# Patient Record
Sex: Male | Born: 1940 | Race: White | Hispanic: No | Smoking: Former smoker
Health system: Southern US, Community
[De-identification: ages and names within clinical notes are randomized; demographics above are authoritative.]

## PROBLEM LIST (undated history)

## (undated) DIAGNOSIS — H269 Unspecified cataract: Secondary | ICD-10-CM

## (undated) DIAGNOSIS — I2510 Atherosclerotic heart disease of native coronary artery without angina pectoris: Secondary | ICD-10-CM

## (undated) DIAGNOSIS — I10 Essential (primary) hypertension: Secondary | ICD-10-CM

## (undated) DIAGNOSIS — M1990 Unspecified osteoarthritis, unspecified site: Secondary | ICD-10-CM

## (undated) DIAGNOSIS — H409 Unspecified glaucoma: Secondary | ICD-10-CM

## (undated) DIAGNOSIS — K279 Peptic ulcer, site unspecified, unspecified as acute or chronic, without hemorrhage or perforation: Secondary | ICD-10-CM

## (undated) DIAGNOSIS — N400 Enlarged prostate without lower urinary tract symptoms: Secondary | ICD-10-CM

## (undated) DIAGNOSIS — E785 Hyperlipidemia, unspecified: Secondary | ICD-10-CM

## (undated) DIAGNOSIS — I214 Non-ST elevation (NSTEMI) myocardial infarction: Secondary | ICD-10-CM

## (undated) DIAGNOSIS — M75100 Unspecified rotator cuff tear or rupture of unspecified shoulder, not specified as traumatic: Secondary | ICD-10-CM

## (undated) DIAGNOSIS — J189 Pneumonia, unspecified organism: Secondary | ICD-10-CM

## (undated) DIAGNOSIS — F419 Anxiety disorder, unspecified: Secondary | ICD-10-CM

## (undated) DIAGNOSIS — K219 Gastro-esophageal reflux disease without esophagitis: Secondary | ICD-10-CM

## (undated) DIAGNOSIS — Z955 Presence of coronary angioplasty implant and graft: Secondary | ICD-10-CM

## (undated) HISTORY — DX: Essential (primary) hypertension: I10

## (undated) HISTORY — PX: LEG SURGERY: SHX1003

## (undated) HISTORY — DX: Pneumonia, unspecified organism: J18.9

## (undated) HISTORY — DX: Unspecified rotator cuff tear or rupture of unspecified shoulder, not specified as traumatic: M75.100

## (undated) HISTORY — PX: TONSILLECTOMY: SUR1361

## (undated) HISTORY — DX: Anxiety disorder, unspecified: F41.9

## (undated) HISTORY — PX: APPENDECTOMY: SHX54

## (undated) HISTORY — DX: Unspecified osteoarthritis, unspecified site: M19.90

## (undated) HISTORY — DX: Unspecified cataract: H26.9

## (undated) HISTORY — PX: EYE SURGERY: SHX253

## (undated) HISTORY — DX: Atherosclerotic heart disease of native coronary artery without angina pectoris: I25.10

## (undated) HISTORY — DX: Hyperlipidemia, unspecified: E78.5

## (undated) HISTORY — DX: Peptic ulcer, site unspecified, unspecified as acute or chronic, without hemorrhage or perforation: K27.9

## (undated) HISTORY — DX: Presence of coronary angioplasty implant and graft: Z95.5

## (undated) HISTORY — DX: Unspecified glaucoma: H40.9

---

## 2006-01-13 HISTORY — PX: CORONARY ARTERY BYPASS GRAFT: SHX141

## 2006-09-11 ENCOUNTER — Ambulatory Visit: Payer: Self-pay | Admitting: Cardiothoracic Surgery

## 2006-10-28 ENCOUNTER — Ambulatory Visit: Payer: Self-pay | Admitting: Thoracic Surgery (Cardiothoracic Vascular Surgery)

## 2009-06-28 ENCOUNTER — Telehealth: Payer: Self-pay | Admitting: Cardiology

## 2010-02-12 NOTE — Progress Notes (Signed)
Summary: QUESTIONS REGARDING MED  Phone Note Call from Patient Call back at Home Phone [phone]   Caller: Patient [phone] Reason for Call: Talk to Nurse Summary of Call: PT WAITING ON MAIL ORDER OF DILTIAZEL 120MG  ,JUST FOUND OUT IT WON'T BE SHIPPED UNTIL 6-21, AND HE WON'T GET IT UNTIL 6-23, CAN HE BE WITHOUT THE MED THAT LONG-ALREADY BEEN OUT FOR TWO DAYS-IF NOT HE HAS A WRITTEN RX HE CAN FILL Initial call taken by: Glynda Jaeger,  June 28, 2009 4:17 PM  Follow-up for Phone Call        pt needs to be on medication,  he states he has an rx for a local pharmacy and will have it filled Follow-up by: Charolotte Capuchin, RN,  June 28, 2009 4:46 PM

## 2014-06-07 DIAGNOSIS — I2510 Atherosclerotic heart disease of native coronary artery without angina pectoris: Secondary | ICD-10-CM

## 2014-06-07 HISTORY — DX: Atherosclerotic heart disease of native coronary artery without angina pectoris: I25.10

## 2014-06-17 DIAGNOSIS — I25810 Atherosclerosis of coronary artery bypass graft(s) without angina pectoris: Secondary | ICD-10-CM

## 2014-06-17 DIAGNOSIS — I25700 Atherosclerosis of coronary artery bypass graft(s), unspecified, with unstable angina pectoris: Secondary | ICD-10-CM | POA: Insufficient documentation

## 2014-06-17 HISTORY — DX: Atherosclerosis of coronary artery bypass graft(s) without angina pectoris: I25.810

## 2014-06-17 HISTORY — DX: Atherosclerosis of coronary artery bypass graft(s), unspecified, with unstable angina pectoris: I25.700

## 2014-11-14 HISTORY — PX: CORONARY ANGIOPLASTY WITH STENT PLACEMENT: SHX49

## 2015-04-01 DIAGNOSIS — K828 Other specified diseases of gallbladder: Secondary | ICD-10-CM | POA: Insufficient documentation

## 2015-04-01 DIAGNOSIS — R9439 Abnormal result of other cardiovascular function study: Secondary | ICD-10-CM

## 2015-04-01 HISTORY — DX: Other specified diseases of gallbladder: K82.8

## 2015-04-01 HISTORY — DX: Abnormal result of other cardiovascular function study: R94.39

## 2017-06-30 ENCOUNTER — Ambulatory Visit: Payer: Self-pay | Admitting: Cardiology

## 2017-07-02 ENCOUNTER — Ambulatory Visit: Payer: Self-pay | Admitting: Cardiology

## 2017-07-21 DIAGNOSIS — Z951 Presence of aortocoronary bypass graft: Secondary | ICD-10-CM

## 2017-07-21 DIAGNOSIS — K581 Irritable bowel syndrome with constipation: Secondary | ICD-10-CM | POA: Insufficient documentation

## 2017-07-21 DIAGNOSIS — N400 Enlarged prostate without lower urinary tract symptoms: Secondary | ICD-10-CM | POA: Insufficient documentation

## 2017-07-21 DIAGNOSIS — E7800 Pure hypercholesterolemia, unspecified: Secondary | ICD-10-CM

## 2017-07-21 DIAGNOSIS — I10 Essential (primary) hypertension: Secondary | ICD-10-CM | POA: Insufficient documentation

## 2017-07-21 DIAGNOSIS — K219 Gastro-esophageal reflux disease without esophagitis: Secondary | ICD-10-CM

## 2017-07-21 DIAGNOSIS — E785 Hyperlipidemia, unspecified: Secondary | ICD-10-CM | POA: Insufficient documentation

## 2017-07-21 HISTORY — DX: Pure hypercholesterolemia, unspecified: E78.00

## 2017-07-21 HISTORY — DX: Presence of aortocoronary bypass graft: Z95.1

## 2017-07-21 HISTORY — DX: Gastro-esophageal reflux disease without esophagitis: K21.9

## 2017-07-21 HISTORY — DX: Irritable bowel syndrome with constipation: K58.1

## 2017-07-27 ENCOUNTER — Ambulatory Visit: Payer: Self-pay | Admitting: Cardiology

## 2017-08-17 ENCOUNTER — Encounter: Payer: Self-pay | Admitting: Cardiology

## 2017-08-18 ENCOUNTER — Encounter: Payer: Self-pay | Admitting: Cardiology

## 2017-08-18 ENCOUNTER — Ambulatory Visit: Payer: Medicare HMO | Admitting: Cardiology

## 2017-08-18 VITALS — BP 122/68 | HR 62 | Ht 70.0 in | Wt 194.0 lb

## 2017-08-18 DIAGNOSIS — I208 Other forms of angina pectoris: Secondary | ICD-10-CM

## 2017-08-18 DIAGNOSIS — I2510 Atherosclerotic heart disease of native coronary artery without angina pectoris: Secondary | ICD-10-CM | POA: Insufficient documentation

## 2017-08-18 DIAGNOSIS — Z951 Presence of aortocoronary bypass graft: Secondary | ICD-10-CM

## 2017-08-18 DIAGNOSIS — E782 Mixed hyperlipidemia: Secondary | ICD-10-CM | POA: Diagnosis not present

## 2017-08-18 DIAGNOSIS — I10 Essential (primary) hypertension: Secondary | ICD-10-CM | POA: Diagnosis not present

## 2017-08-18 NOTE — Patient Instructions (Signed)
Medication Instructions:  Your physician recommends that you continue on your current medications as directed. Please refer to the Current Medication list given to you today.   Labwork: NONE  Testing/Procedures: Your physician has requested that you have a lexiscan myoview. For further information please visit [email or web address]. Please follow instruction sheet, as given.  Stress Test Directions for [facility]: 1.) Please check in at the outpatient center at [facility] the day of your testing. 2.) Nothing to eat or drink after midnight prior to testing. You may take your medications that morning with water. 3.) Please be aware that the test can take up to 3-4 hours. 4.) Should you have any problem with the appointment date or time, please call [phone].    Follow-Up: Your physician wants you to follow-up in: 6 months. You will receive a reminder letter in the mail two months in advance. If you don't receive a letter, please call our office to schedule the follow-up appointment.  Any Other Special Instructions Will Be Listed Below (If Applicable).     If you need a refill on your cardiac medications before your next appointment, please call your pharmacy.

## 2017-08-18 NOTE — Progress Notes (Signed)
Cardiology Office Note:    Date:  08/18/2017   ID:  Ival Bible, DOB [birth year], MRN [id number]  PCP:  Shelbie Ammons, MD  Cardiologist:  Garwin Brothers, MD   Referring MD: Shelbie Ammons, MD    ASSESSMENT:    1. Other forms of angina pectoris (HCC)   2. Essential hypertension   3. Hx of CABG   4. Mixed hyperlipidemia    PLAN:    In order of problems listed above:  1. Secondary prevention stressed to the patient.  Importance of compliance with diet and medications stressed and he vocalized understanding. 2. His blood pressure is stable.  Diet was explained for dyslipidemia.  Sublingual nitroglycerin prescription was sent, its protocol and 911 protocol explained and the patient vocalized understanding questions were answered to the patient's satisfaction 3. In view of his significant symptoms we will do a Lexiscan sestamibi. 4. 4 months follow-up or earlier if he has any concerns.   Medication Adjustments/Labs and Tests Ordered: Current medicines are reviewed at length with the patient today.  Concerns regarding medicines are outlined above.  Orders Placed This Encounter  Procedures  . Myocardial Perfusion Imaging  . EKG 12-Lead   No orders of the defined types were placed in this encounter.    History of Present Illness:    Austin Malone is a 77 y.o. male who is being seen today for the evaluation of angina pectoris at the request of Shelbie Ammons, MD.  Patient is a pleasant 77 year old male.  He recently was in the hospital for dehydration and treated.  He mentions to me that he has chest tightness almost couple of times every week.  This happens at night.  No orthopnea or PND.  This does not radiate to the neck or to the arm.  The patient is a sublingual nitroglycerin with relief of the symptoms.  This happens on a very regular basis.  At the time of my evaluation, the patient is alert awake oriented and in no distress.  This patient has been under my care in my  previous practice.  He is here now to transfer his care and be established with my current practice.  Past Medical History:  Diagnosis Date  . Anxiety   . Arthritis   . CAD (coronary artery disease)   . Cataracts, bilateral   . Glaucoma   . Hyperlipidemia   . Hypertension   . Pneumonia   . PUD (peptic ulcer disease)   . Rotator cuff tear     Past Surgical History:  Procedure Laterality Date  . APPENDECTOMY    . CORONARY ARTERY BYPASS GRAFT  2008  . EYE SURGERY    . heart stent    . LEG SURGERY    . TONSILLECTOMY      Current Medications: Current Meds  Medication Sig  . alfuzosin (UROXATRAL) 10 MG 24 hr tablet Take 1 tablet by mouth daily.  Marland Kitchen ALPRAZolam (XANAX) 0.5 MG tablet Take 1 tablet by mouth daily.  Marland Kitchen aspirin EC 81 MG tablet Take 81 mg by mouth daily.  . bethanechol (URECHOLINE) 50 MG tablet Take 50 mg by mouth 3 (three) times daily.  . carvedilol (COREG) 6.25 MG tablet Take 1 tablet by mouth 2 (two) times daily.  . Cholecalciferol (VITAMIN D-1000 MAX ST) 1000 units tablet Take 1 capsule by mouth daily.  . Coenzyme Q10 100 MG capsule Take 1 capsule by mouth daily.  . dorzolamide-timolol (COSOPT) 22.3-6.8 MG/ML ophthalmic solution Place 1  drop into both eyes 2 times daily.  . finasteride (PROSCAR) 5 MG tablet Take 1 tablet by mouth daily.  . furosemide (LASIX) 20 MG tablet Take 1 tablet by mouth daily.  . Melatonin 3 MG TABS Take 1 tablet by mouth daily.  . nitroGLYCERIN (NITROSTAT) 0.4 MG SL tablet Place 0.4 mg under the tongue every 5 (five) minutes as needed.  Marland Kitchen omega-3 acid ethyl esters (LOVAZA) 1 g capsule Take 2 g by mouth daily.  . ondansetron (ZOFRAN) 8 MG tablet Take by mouth every 8 (eight) hours as needed for nausea or vomiting.  . pantoprazole (PROTONIX) 40 MG tablet Take 1 tablet by mouth daily.  . ranolazine (RANEXA) 1000 MG SR tablet Take 1 tablet by mouth 2 (two) times daily.  . TRAVATAN Z 0.004 % SOLN ophthalmic solution PLACE 1 DROP IN EACH EYE EVERY  EVENING FOR 30 DAYS. MAY USE ADDITIONAL DROP(S) DUE TO DEXTERITY issuE.  . vitamin C (ASCORBIC ACID) 500 MG tablet Take 1 tablet by mouth daily.     Allergies:   Codeine and Lisinopril   Social History   Socioeconomic History  . Marital status: Married    Spouse name: Not on file  . Number of children: Not on file  . Years of education: Not on file  . Highest education level: Not on file  Occupational History  . Not on file  Social Needs  . Financial resource strain: Not on file  . Food insecurity:    Worry: Not on file    Inability: Not on file  . Transportation needs:    Medical: Not on file    Non-medical: Not on file  Tobacco Use  . Smoking status: Former Games developer  . Smokeless tobacco: Never Used  Substance and Sexual Activity  . Alcohol use: Not on file  . Drug use: Not on file  . Sexual activity: Not on file  Lifestyle  . Physical activity:    Days per week: Not on file    Minutes per session: Not on file  . Stress: Not on file  Relationships  . Social connections:    Talks on phone: Not on file    Gets together: Not on file    Attends religious service: Not on file    Active member of club or organization: Not on file    Attends meetings of clubs or organizations: Not on file    Relationship status: Not on file  Other Topics Concern  . Not on file  Social History Narrative  . Not on file     Family History: The patient's family history is not on file.  ROS:   Please see the history of present illness.    All other systems reviewed and are negative.  EKGs/Labs/Other Studies Reviewed:    The following studies were reviewed today: EKG reveals sinus rhythm and nonspecific ST-T changes   Recent Labs: No results found for requested labs within last 8760 hours.  Recent Lipid Panel No results found for: CHOL, TRIG, HDL, CHOLHDL, VLDL, LDLCALC, LDLDIRECT  Physical Exam:    VS:  BP 122/68 (BP Location: Right Arm, Patient Position: Sitting, Cuff Size:  Normal)   Pulse 62   Ht 5\' 10"  (1.778 m)   Wt 194 lb (88 kg)   SpO2 97%   BMI 27.84 kg/m     Wt Readings from Last 3 Encounters:  08/18/17 194 lb (88 kg)     GEN: Patient is in no acute distress HEENT: Normal  NECK: No JVD; No carotid bruits LYMPHATICS: No lymphadenopathy CARDIAC: S1 S2 regular, 2/6 systolic murmur at the apex. RESPIRATORY:  Clear to auscultation without rales, wheezing or rhonchi  ABDOMEN: Soft, non-tender, non-distended MUSCULOSKELETAL:  No edema; No deformity  SKIN: Warm and dry NEUROLOGIC:  Alert and oriented x 3 PSYCHIATRIC:  Normal affect    Signed, Garwin Brothersajan R Mylen Mangan, MD  08/18/2017 3:41 PM    [facility]

## 2017-08-20 DIAGNOSIS — Z789 Other specified health status: Secondary | ICD-10-CM

## 2017-08-20 DIAGNOSIS — K219 Gastro-esophageal reflux disease without esophagitis: Secondary | ICD-10-CM

## 2017-08-20 DIAGNOSIS — I10 Essential (primary) hypertension: Secondary | ICD-10-CM

## 2017-08-20 DIAGNOSIS — I249 Acute ischemic heart disease, unspecified: Secondary | ICD-10-CM

## 2017-08-20 DIAGNOSIS — E781 Pure hyperglyceridemia: Secondary | ICD-10-CM

## 2017-08-20 DIAGNOSIS — I2510 Atherosclerotic heart disease of native coronary artery without angina pectoris: Secondary | ICD-10-CM

## 2017-08-20 DIAGNOSIS — R079 Chest pain, unspecified: Secondary | ICD-10-CM

## 2017-08-21 ENCOUNTER — Encounter (HOSPITAL_COMMUNITY)
Admission: AD | Disposition: A | Discharge status: Home / Self Care | Payer: Self-pay | Source: Other Acute Inpatient Hospital | Attending: Internal Medicine

## 2017-08-21 ENCOUNTER — Inpatient Hospital Stay (HOSPITAL_COMMUNITY)
Admission: AD | Admit: 2017-08-21 | Discharge: 2017-08-23 | DRG: 280 | Disposition: A | Discharge status: Home / Self Care | Payer: Medicare HMO | Source: Other Acute Inpatient Hospital | Attending: Internal Medicine | Admitting: Internal Medicine

## 2017-08-21 ENCOUNTER — Encounter (HOSPITAL_COMMUNITY): Payer: Self-pay

## 2017-08-21 ENCOUNTER — Other Ambulatory Visit: Payer: Self-pay

## 2017-08-21 DIAGNOSIS — Z885 Allergy status to narcotic agent status: Secondary | ICD-10-CM

## 2017-08-21 DIAGNOSIS — I10 Essential (primary) hypertension: Secondary | ICD-10-CM | POA: Diagnosis present

## 2017-08-21 DIAGNOSIS — I25810 Atherosclerosis of coronary artery bypass graft(s) without angina pectoris: Secondary | ICD-10-CM | POA: Diagnosis present

## 2017-08-21 DIAGNOSIS — Z955 Presence of coronary angioplasty implant and graft: Secondary | ICD-10-CM | POA: Diagnosis not present

## 2017-08-21 DIAGNOSIS — I130 Hypertensive heart and chronic kidney disease with heart failure and stage 1 through stage 4 chronic kidney disease, or unspecified chronic kidney disease: Secondary | ICD-10-CM | POA: Diagnosis present

## 2017-08-21 DIAGNOSIS — E785 Hyperlipidemia, unspecified: Secondary | ICD-10-CM | POA: Diagnosis not present

## 2017-08-21 DIAGNOSIS — Z79890 Hormone replacement therapy (postmenopausal): Secondary | ICD-10-CM

## 2017-08-21 DIAGNOSIS — I214 Non-ST elevation (NSTEMI) myocardial infarction: Secondary | ICD-10-CM

## 2017-08-21 DIAGNOSIS — I509 Heart failure, unspecified: Secondary | ICD-10-CM | POA: Diagnosis present

## 2017-08-21 DIAGNOSIS — I252 Old myocardial infarction: Secondary | ICD-10-CM | POA: Diagnosis not present

## 2017-08-21 DIAGNOSIS — N400 Enlarged prostate without lower urinary tract symptoms: Secondary | ICD-10-CM | POA: Diagnosis not present

## 2017-08-21 DIAGNOSIS — Z7982 Long term (current) use of aspirin: Secondary | ICD-10-CM | POA: Diagnosis not present

## 2017-08-21 DIAGNOSIS — I2510 Atherosclerotic heart disease of native coronary artery without angina pectoris: Secondary | ICD-10-CM

## 2017-08-21 DIAGNOSIS — I5033 Acute on chronic diastolic (congestive) heart failure: Secondary | ICD-10-CM | POA: Diagnosis present

## 2017-08-21 DIAGNOSIS — N183 Chronic kidney disease, stage 3 (moderate): Secondary | ICD-10-CM | POA: Diagnosis not present

## 2017-08-21 DIAGNOSIS — I351 Nonrheumatic aortic (valve) insufficiency: Secondary | ICD-10-CM | POA: Diagnosis not present

## 2017-08-21 DIAGNOSIS — I25110 Atherosclerotic heart disease of native coronary artery with unstable angina pectoris: Secondary | ICD-10-CM | POA: Diagnosis present

## 2017-08-21 DIAGNOSIS — I2584 Coronary atherosclerosis due to calcified coronary lesion: Secondary | ICD-10-CM | POA: Diagnosis not present

## 2017-08-21 DIAGNOSIS — F1010 Alcohol abuse, uncomplicated: Secondary | ICD-10-CM | POA: Diagnosis present

## 2017-08-21 DIAGNOSIS — Z91041 Radiographic dye allergy status: Secondary | ICD-10-CM

## 2017-08-21 DIAGNOSIS — K219 Gastro-esophageal reflux disease without esophagitis: Secondary | ICD-10-CM | POA: Diagnosis present

## 2017-08-21 DIAGNOSIS — I340 Nonrheumatic mitral (valve) insufficiency: Secondary | ICD-10-CM | POA: Diagnosis not present

## 2017-08-21 DIAGNOSIS — Z8711 Personal history of peptic ulcer disease: Secondary | ICD-10-CM | POA: Diagnosis not present

## 2017-08-21 DIAGNOSIS — H409 Unspecified glaucoma: Secondary | ICD-10-CM | POA: Diagnosis not present

## 2017-08-21 DIAGNOSIS — Z8249 Family history of ischemic heart disease and other diseases of the circulatory system: Secondary | ICD-10-CM | POA: Diagnosis not present

## 2017-08-21 DIAGNOSIS — Z79899 Other long term (current) drug therapy: Secondary | ICD-10-CM | POA: Diagnosis not present

## 2017-08-21 DIAGNOSIS — Z87891 Personal history of nicotine dependence: Secondary | ICD-10-CM | POA: Diagnosis not present

## 2017-08-21 DIAGNOSIS — I249 Acute ischemic heart disease, unspecified: Secondary | ICD-10-CM | POA: Diagnosis not present

## 2017-08-21 DIAGNOSIS — F419 Anxiety disorder, unspecified: Secondary | ICD-10-CM | POA: Diagnosis not present

## 2017-08-21 DIAGNOSIS — R079 Chest pain, unspecified: Secondary | ICD-10-CM | POA: Diagnosis present

## 2017-08-21 DIAGNOSIS — Z888 Allergy status to other drugs, medicaments and biological substances status: Secondary | ICD-10-CM | POA: Diagnosis not present

## 2017-08-21 HISTORY — DX: Gastro-esophageal reflux disease without esophagitis: K21.9

## 2017-08-21 HISTORY — DX: Non-ST elevation (NSTEMI) myocardial infarction: I21.4

## 2017-08-21 HISTORY — PX: LEFT HEART CATH AND CORS/GRAFTS ANGIOGRAPHY: CATH118250

## 2017-08-21 HISTORY — DX: Benign prostatic hyperplasia without lower urinary tract symptoms: N40.0

## 2017-08-21 LAB — TYPE AND SCREEN
ABO/RH(D): A POS
Antibody Screen: NEGATIVE

## 2017-08-21 LAB — CBC
HEMATOCRIT: 35.7 % — AB (ref 39.0–52.0)
Hemoglobin: 12.1 g/dL — ABNORMAL LOW (ref 13.0–17.0)
MCH: 33.4 pg (ref 26.0–34.0)
MCHC: 33.9 g/dL (ref 30.0–36.0)
MCV: 98.6 fL (ref 78.0–100.0)
Platelets: 216 10*3/uL (ref 150–400)
RBC: 3.62 MIL/uL — ABNORMAL LOW (ref 4.22–5.81)
RDW: 12.8 % (ref 11.5–15.5)
WBC: 6.3 10*3/uL (ref 4.0–10.5)

## 2017-08-21 LAB — TROPONIN I
TROPONIN I: 0.12 ng/mL — AB (ref <0.03)
TROPONIN I: 0.07 ng/mL — AB (ref <0.03)

## 2017-08-21 LAB — ABO/RH: ABO/RH(D): A POS

## 2017-08-21 SURGERY — LEFT HEART CATH AND CORS/GRAFTS ANGIOGRAPHY
Anesthesia: LOCAL

## 2017-08-21 MED ORDER — VERAPAMIL HCL 2.5 MG/ML IV SOLN
INTRAVENOUS | Status: DC | PRN
  Administered 2017-08-21: 10 mL via INTRA_ARTERIAL

## 2017-08-21 MED ORDER — SODIUM CHLORIDE 0.9% FLUSH: 3.0000 mL | INTRAVENOUS | Status: DC | PRN

## 2017-08-21 MED ORDER — FAMOTIDINE IN NACL 20-0.9 MG/50ML-% IV SOLN
20.0000 mg | Freq: Once | INTRAVENOUS | Status: AC
  Administered 2017-08-21: 20 mg via INTRAVENOUS
  Filled 2017-08-21: qty 50

## 2017-08-21 MED ORDER — CLOPIDOGREL BISULFATE 75 MG PO TABS
75.0000 mg | ORAL_TABLET | Freq: Every day | ORAL | Status: DC
  Administered 2017-08-22 – 2017-08-23 (×2): 75 mg via ORAL
  Filled 2017-08-21 (×2): qty 1

## 2017-08-21 MED ORDER — HEPARIN SODIUM (PORCINE) 1000 UNIT/ML IJ SOLN
INTRAMUSCULAR | Status: DC | PRN
  Administered 2017-08-21: 4000 [IU] via INTRAVENOUS

## 2017-08-21 MED ORDER — HEPARIN (PORCINE) IN NACL 1000-0.9 UT/500ML-% IV SOLN
INTRAVENOUS | Status: AC
  Filled 2017-08-21: qty 1000

## 2017-08-21 MED ORDER — SODIUM CHLORIDE 0.9 % IV SOLN
INTRAVENOUS | Status: AC
  Administered 2017-08-21: 19:00:00 via INTRAVENOUS

## 2017-08-21 MED ORDER — FENTANYL CITRATE (PF) 100 MCG/2ML IJ SOLN
INTRAMUSCULAR | Status: DC | PRN
  Administered 2017-08-21: 25 ug via INTRAVENOUS

## 2017-08-21 MED ORDER — DIPHENHYDRAMINE HCL 50 MG/ML IJ SOLN
25.0000 mg | Freq: Once | INTRAMUSCULAR | Status: AC
  Administered 2017-08-21: 25 mg via INTRAVENOUS
  Filled 2017-08-21: qty 1

## 2017-08-21 MED ORDER — SODIUM CHLORIDE 0.9 % WEIGHT BASED INFUSION: 1.0000 mL/kg/h | INTRAVENOUS | Status: DC

## 2017-08-21 MED ORDER — SODIUM CHLORIDE 0.9% FLUSH: 3.0000 mL | Freq: Two times a day (BID) | INTRAVENOUS | Status: DC

## 2017-08-21 MED ORDER — ONDANSETRON HCL 4 MG PO TABS: 4.0000 mg | ORAL_TABLET | Freq: Four times a day (QID) | ORAL | Status: DC | PRN

## 2017-08-21 MED ORDER — ISOSORBIDE MONONITRATE ER 30 MG PO TB24
30.0000 mg | ORAL_TABLET | Freq: Every day | ORAL | Status: DC
  Administered 2017-08-22 – 2017-08-23 (×2): 30 mg via ORAL
  Filled 2017-08-21 (×2): qty 1

## 2017-08-21 MED ORDER — SODIUM CHLORIDE 0.9 % IV SOLN: 250.0000 mL | INTRAVENOUS | Status: DC | PRN

## 2017-08-21 MED ORDER — FUROSEMIDE 10 MG/ML IJ SOLN: 40.0000 mg | Freq: Two times a day (BID) | INTRAMUSCULAR | Status: DC

## 2017-08-21 MED ORDER — LIDOCAINE HCL (PF) 1 % IJ SOLN
INTRAMUSCULAR | Status: DC | PRN
  Administered 2017-08-21: 2 mL

## 2017-08-21 MED ORDER — ALBUTEROL SULFATE (2.5 MG/3ML) 0.083% IN NEBU: 2.5000 mg | INHALATION_SOLUTION | Freq: Four times a day (QID) | RESPIRATORY_TRACT | Status: DC | PRN

## 2017-08-21 MED ORDER — ASPIRIN 81 MG PO CHEW
CHEWABLE_TABLET | ORAL | Status: AC
  Filled 2017-08-21: qty 4

## 2017-08-21 MED ORDER — SODIUM CHLORIDE 0.9 % WEIGHT BASED INFUSION: 3.0000 mL/kg/h | INTRAVENOUS | Status: DC

## 2017-08-21 MED ORDER — HEPARIN (PORCINE) IN NACL 1000-0.9 UT/500ML-% IV SOLN
INTRAVENOUS | Status: DC | PRN
  Administered 2017-08-21 (×2): 500 mL

## 2017-08-21 MED ORDER — ALPRAZOLAM 0.5 MG PO TABS
0.5000 mg | ORAL_TABLET | Freq: Four times a day (QID) | ORAL | Status: DC | PRN
  Administered 2017-08-21: 0.5 mg via ORAL
  Filled 2017-08-21: qty 1

## 2017-08-21 MED ORDER — IOPAMIDOL (ISOVUE-370) INJECTION 76%
INTRAVENOUS | Status: DC | PRN
  Administered 2017-08-21: 75 mL via INTRA_ARTERIAL

## 2017-08-21 MED ORDER — SODIUM CHLORIDE 0.9% IV SOLUTION: Freq: Once | INTRAVENOUS | Status: AC

## 2017-08-21 MED ORDER — METHYLPREDNISOLONE SODIUM SUCC 125 MG IJ SOLR
INTRAMUSCULAR | Status: AC
  Filled 2017-08-21: qty 2

## 2017-08-21 MED ORDER — NITROGLYCERIN 0.4 MG SL SUBL: 0.4000 mg | SUBLINGUAL_TABLET | SUBLINGUAL | Status: DC | PRN

## 2017-08-21 MED ORDER — ONDANSETRON HCL 4 MG/2ML IJ SOLN: 4.0000 mg | Freq: Four times a day (QID) | INTRAMUSCULAR | Status: DC | PRN

## 2017-08-21 MED ORDER — SODIUM CHLORIDE 0.9% FLUSH
3.0000 mL | Freq: Two times a day (BID) | INTRAVENOUS | Status: DC
  Administered 2017-08-21: 3 mL via INTRAVENOUS

## 2017-08-21 MED ORDER — LATANOPROST 0.005 % OP SOLN
1.0000 [drp] | Freq: Every day | OPHTHALMIC | Status: DC
  Administered 2017-08-21 – 2017-08-22 (×2): 1 [drp] via OPHTHALMIC
  Filled 2017-08-21: qty 2.5

## 2017-08-21 MED ORDER — MIDAZOLAM HCL 2 MG/2ML IJ SOLN
INTRAMUSCULAR | Status: AC
  Filled 2017-08-21: qty 2

## 2017-08-21 MED ORDER — FINASTERIDE 5 MG PO TABS
5.0000 mg | ORAL_TABLET | Freq: Every day | ORAL | Status: DC
  Administered 2017-08-21 – 2017-08-23 (×3): 5 mg via ORAL
  Filled 2017-08-21 (×3): qty 1

## 2017-08-21 MED ORDER — LIDOCAINE HCL (PF) 1 % IJ SOLN
INTRAMUSCULAR | Status: AC
  Filled 2017-08-21: qty 30

## 2017-08-21 MED ORDER — MELATONIN 3 MG PO TABS
18.0000 mg | ORAL_TABLET | Freq: Every day | ORAL | Status: DC
  Administered 2017-08-21 – 2017-08-22 (×2): 18 mg via ORAL
  Filled 2017-08-21 (×3): qty 6

## 2017-08-21 MED ORDER — DORZOLAMIDE HCL-TIMOLOL MAL 2-0.5 % OP SOLN
1.0000 [drp] | Freq: Two times a day (BID) | OPHTHALMIC | Status: DC
  Administered 2017-08-21 – 2017-08-23 (×4): 1 [drp] via OPHTHALMIC
  Filled 2017-08-21: qty 10

## 2017-08-21 MED ORDER — METHYLPREDNISOLONE SODIUM SUCC 125 MG IJ SOLR
125.0000 mg | Freq: Once | INTRAMUSCULAR | Status: AC
  Administered 2017-08-21: 125 mg via INTRAVENOUS

## 2017-08-21 MED ORDER — HEPARIN SODIUM (PORCINE) 1000 UNIT/ML IJ SOLN
INTRAMUSCULAR | Status: AC
  Filled 2017-08-21: qty 1

## 2017-08-21 MED ORDER — ACETAMINOPHEN 325 MG PO TABS: 650.0000 mg | ORAL_TABLET | Freq: Four times a day (QID) | ORAL | Status: DC | PRN

## 2017-08-21 MED ORDER — ASPIRIN 81 MG PO CHEW: 81.0000 mg | CHEWABLE_TABLET | ORAL | Status: DC

## 2017-08-21 MED ORDER — ASPIRIN 81 MG PO CHEW
CHEWABLE_TABLET | ORAL | Status: DC | PRN
  Administered 2017-08-21: 324 mg via ORAL

## 2017-08-21 MED ORDER — BETHANECHOL CHLORIDE 25 MG PO TABS
50.0000 mg | ORAL_TABLET | Freq: Three times a day (TID) | ORAL | Status: DC
  Administered 2017-08-21 – 2017-08-23 (×5): 50 mg via ORAL
  Filled 2017-08-21 (×7): qty 2

## 2017-08-21 MED ORDER — MIDAZOLAM HCL 2 MG/2ML IJ SOLN
INTRAMUSCULAR | Status: DC | PRN
  Administered 2017-08-21: 1 mg via INTRAVENOUS

## 2017-08-21 MED ORDER — CARVEDILOL 6.25 MG PO TABS
6.2500 mg | ORAL_TABLET | Freq: Two times a day (BID) | ORAL | Status: DC
  Administered 2017-08-21 – 2017-08-23 (×4): 6.25 mg via ORAL
  Filled 2017-08-21 (×4): qty 1

## 2017-08-21 MED ORDER — ALFUZOSIN HCL ER 10 MG PO TB24
10.0000 mg | ORAL_TABLET | Freq: Every day | ORAL | Status: DC
  Administered 2017-08-22 – 2017-08-23 (×2): 10 mg via ORAL
  Filled 2017-08-21 (×4): qty 1

## 2017-08-21 MED ORDER — ASPIRIN 81 MG PO CHEW
81.0000 mg | CHEWABLE_TABLET | Freq: Every day | ORAL | Status: DC
  Administered 2017-08-22 – 2017-08-23 (×2): 81 mg via ORAL
  Filled 2017-08-21 (×2): qty 1

## 2017-08-21 MED ORDER — PANTOPRAZOLE SODIUM 40 MG PO TBEC
40.0000 mg | DELAYED_RELEASE_TABLET | Freq: Every day | ORAL | Status: DC
  Administered 2017-08-21 – 2017-08-23 (×3): 40 mg via ORAL
  Filled 2017-08-21 (×3): qty 1

## 2017-08-21 MED ORDER — RANOLAZINE ER 500 MG PO TB12
1000.0000 mg | ORAL_TABLET | Freq: Two times a day (BID) | ORAL | Status: DC
  Administered 2017-08-21 – 2017-08-23 (×4): 1000 mg via ORAL
  Filled 2017-08-21 (×4): qty 2

## 2017-08-21 MED ORDER — ACETAMINOPHEN 650 MG RE SUPP: 650.0000 mg | Freq: Four times a day (QID) | RECTAL | Status: DC | PRN

## 2017-08-21 MED ORDER — VERAPAMIL HCL 2.5 MG/ML IV SOLN
INTRAVENOUS | Status: AC
  Filled 2017-08-21: qty 2

## 2017-08-21 MED ORDER — CLOPIDOGREL BISULFATE 75 MG PO TABS
300.0000 mg | ORAL_TABLET | Freq: Once | ORAL | Status: AC
  Administered 2017-08-21: 300 mg via ORAL
  Filled 2017-08-21: qty 4

## 2017-08-21 MED ORDER — IOPAMIDOL (ISOVUE-370) INJECTION 76%
INTRAVENOUS | Status: AC
  Filled 2017-08-21: qty 100

## 2017-08-21 MED ORDER — FENTANYL CITRATE (PF) 100 MCG/2ML IJ SOLN
INTRAMUSCULAR | Status: AC
Start: 2017-08-21 — End: ?
  Filled 2017-08-21: qty 2

## 2017-08-21 MED ORDER — SODIUM CHLORIDE 0.9% FLUSH
3.0000 mL | Freq: Two times a day (BID) | INTRAVENOUS | Status: DC
  Administered 2017-08-22 (×2): 3 mL via INTRAVENOUS

## 2017-08-21 SURGICAL SUPPLY — 13 items
CATH 5FR JL3.5 JR4 ANG PIG MP (CATHETERS) ×2 IMPLANT
CATH INFINITI 5 FR IM (CATHETERS) ×2 IMPLANT
CATH INFINITI 5FR AL1 (CATHETERS) ×2 IMPLANT
CATH INFINITI 5FR JL4 (CATHETERS) ×2 IMPLANT
DEVICE RAD COMP TR BAND LRG (VASCULAR PRODUCTS) ×2 IMPLANT
GLIDESHEATH SLEND SS 6F .021 (SHEATH) ×2 IMPLANT
GUIDEWIRE INQWIRE 1.5J.035X260 (WIRE) ×1 IMPLANT
INQWIRE 1.5J .035X260CM (WIRE) ×2
KIT HEART LEFT (KITS) ×2 IMPLANT
PACK CARDIAC CATHETERIZATION (CUSTOM PROCEDURE TRAY) ×2 IMPLANT
SYR MEDRAD MARK V 150ML (SYRINGE) ×2 IMPLANT
TRANSDUCER W/STOPCOCK (MISCELLANEOUS) ×2 IMPLANT
TUBING CIL FLEX 10 FLL-RA (TUBING) ×2 IMPLANT

## 2017-08-21 NOTE — H&P (View-Only) (Signed)
Cardiology Consultation:   Patient ID: Austin Malone; [id number]; [birth year]   Admit date: 08/21/2017 Date of Consult: 08/21/2017  Primary Care Provider: Raelyn Number, MD Primary Cardiologist: Dr Geraldo Pitter, 08/18/2017 Primary Electrophysiologist:  n/a   Patient Profile:   Austin Malone is a 77 y.o. male with a hx of CABG x 1 in 2008 w/ LIMA-LAD, SVG-?, SVG-? SVG-PDA,, 07/2014 NSTEMI at [facility] with DES SVG-PDA, angina 08/2017 w/ stress test recommended, HTN, HLD, PUD, OA, SIADH, BPH on 3 meds, GERD, who is being seen today for the evaluation of chest pain at the request of Dr Tamala Julian.  Nuclear stress test 2017 showed ischemia involving the inferolateral wall and global hypokinesis with inferior akinesis.  History of Present Illness:   Austin Malone was admitted to [facility] 07/9-07/17/2019 for UTI>>urinary retention>>bilat hydro-ureteronephrosis>>IV ceftriaxone, cx + coag neg Staph>>oral Keflex, ARI improved, Na 129, improved at d/c  Austin Malone was seen by Dr Geraldo Pitter on 08/06. He was complaining of exertional chest pain. He has been treated for this in the past w/ BB and Ranexa. He was having CP approx 2 x week, usually at night. He was given rx for SL NTG.   Weds pm, he had onset of CP, across his chest, hard pressure, 10/10 at its worst. He would take nitro>>pain-free, but it kept coming back, about every hour. After he ran out of nitro, he came to [facility] No SOB or nausea, no diaphoresis. He got morphine for the pain.   His cardiac enzymes were elevated.  He also had some heart failure which was treated with IV Lasix.  His symptoms improved.  The case was discussed with cardiology at [facility] and with cardiology at [facility].  It was felt that the best option was to transfer him to [facility] and decide on stress testing versus cath here.  Currently, Austin Malone is pain-free.  He is resting comfortably.  He denies shortness of breath.   Past Medical History:  Diagnosis Date  . Anxiety    . Arthritis   . BPH (benign prostatic hyperplasia)   . CAD (coronary artery disease)   . Cataracts, bilateral   . Enlarged prostate   . GERD (gastroesophageal reflux disease)   . Glaucoma   . Hyperlipidemia   . Hypertension   . Non-ST elevation (NSTEMI) myocardial infarction ([place]) 08/21/2017  . Pneumonia   . PUD (peptic ulcer disease)   . Rotator cuff tear     Past Surgical History:  Procedure Laterality Date  . APPENDECTOMY    . CORONARY ANGIOPLASTY WITH STENT PLACEMENT  11/2014   PCI of SVG to PDA x 2 with Xience DES to proximal stenosis, Ultra 5.0 BMS to distal thrombotic lesion  . CORONARY ARTERY BYPASS GRAFT  2008  . EYE SURGERY    . LEG SURGERY    . TONSILLECTOMY       Prior to Admission medications   Medication Sig Start Date End Date Taking? Authorizing Provider  alfuzosin (UROXATRAL) 10 MG 24 hr tablet Take 1 tablet by mouth daily.    [provider]  ALPRAZolam Duanne Moron) 0.5 MG tablet Take 1 tablet by mouth daily. 05/10/14   [provider]  aspirin EC 81 MG tablet Take 81 mg by mouth daily.    [provider]  bethanechol (URECHOLINE) 50 MG tablet Take 50 mg by mouth 3 (three) times daily.    [provider]  carvedilol (COREG) 6.25 MG tablet Take 1 tablet by mouth 2 (two) times daily. 09/01/16  [provider]  Cholecalciferol (VITAMIN D-1000 MAX ST) 1000 units tablet Take 1 capsule by mouth daily.    [provider]  Coenzyme Q10 100 MG capsule Take 1 capsule by mouth daily.    [provider]  dorzolamide-timolol (COSOPT) 22.3-6.8 MG/ML ophthalmic solution Place 1 drop into both eyes 2 times daily. 05/03/14   [provider]  finasteride (PROSCAR) 5 MG tablet Take 1 tablet by mouth daily.    [provider]  furosemide (LASIX) 20 MG tablet Take 1 tablet by mouth daily. 07/30/17   [provider]  Melatonin 3 MG TABS Take 1 tablet by mouth daily. 07/29/17   [provider]    nitroGLYCERIN (NITROSTAT) 0.4 MG SL tablet Place 0.4 mg under the tongue every 5 (five) minutes as needed. 03/10/16   [provider]  omega-3 acid ethyl esters (LOVAZA) 1 g capsule Take 2 g by mouth daily.    [provider]  ondansetron (ZOFRAN) 8 MG tablet Take by mouth every 8 (eight) hours as needed for nausea or vomiting.    [provider]  pantoprazole (PROTONIX) 40 MG tablet Take 1 tablet by mouth daily.    [provider]  ranolazine (RANEXA) 1000 MG SR tablet Take 1 tablet by mouth 2 (two) times daily. 02/25/16   [provider]  TRAVATAN Z 0.004 % SOLN ophthalmic solution PLACE 1 DROP IN EACH EYE EVERY EVENING FOR 30 DAYS. MAY USE ADDITIONAL DROP(S) DUE TO DEXTERITY issuE. 06/24/17   [provider]  vitamin C (ASCORBIC ACID) 500 MG tablet Take 1 tablet by mouth daily.    [provider]    Inpatient Medications: Scheduled Meds: . sodium chloride   Intravenous Once  . [START ON 08/22/2017] aspirin  81 mg Oral Pre-Cath  . diphenhydrAMINE  25 mg Intravenous Once  . furosemide  40 mg Intravenous BID  . methylPREDNISolone (SOLU-MEDROL) injection  125 mg Intravenous Once  . methylPREDNISolone sodium succinate      . sodium chloride flush  3 mL Intravenous Q12H  . sodium chloride flush  3 mL Intravenous Q12H   Continuous Infusions: . sodium chloride    . sodium chloride     Followed by  . sodium chloride    . famotidine (PEPCID) IV     PRN Meds: sodium chloride, acetaminophen **OR** acetaminophen, albuterol, ondansetron **OR** ondansetron (ZOFRAN) IV, sodium chloride flush  Allergies:    Allergies  Allergen Reactions  . Codeine Other (See Comments)    unknown  . Lisinopril Cough  . Contrast Media [Iodinated Diagnostic Agents] Hives and Rash    Social History:   Social History   Socioeconomic History  . Marital status: Married    Spouse name: Not on file  . Number of children: Not on file  . Years of  education: Not on file  . Highest education level: Not on file  Occupational History  . Occupation: Retired  Scientific laboratory technician  . Financial resource strain: Not on file  . Food insecurity:    Worry: Not on file    Inability: Not on file  . Transportation needs:    Medical: Not on file    Non-medical: Not on file  Tobacco Use  . Smoking status: Former Research scientist (life sciences)  . Smokeless tobacco: Never Used  Substance and Sexual Activity  . Alcohol use: Yes    Alcohol/week: 21.0 standard drinks    Types: 21 Cans of beer per week  . Drug use: Yes  Types: Marijuana  . Sexual activity: Not on file  Lifestyle  . Physical activity:    Days per week: Not on file    Minutes per session: Not on file  . Stress: Not on file  Relationships  . Social connections:    Talks on phone: Not on file    Gets together: Not on file    Attends religious service: Not on file    Active member of club or organization: Not on file    Attends meetings of clubs or organizations: Not on file    Relationship status: Not on file  . Intimate partner violence:    Fear of current or ex partner: Not on file    Emotionally abused: Not on file    Physically abused: Not on file    Forced sexual activity: Not on file  Other Topics Concern  . Not on file  Social History Narrative   He lives in [place], [place] alone.    Family History:   Family History  Problem Relation Age of Onset  . Diabetes Mother   . Heart attack Brother    Family Status:  Family Status  Relation Name Status  . Mother  Deceased  . Father  Deceased  . Brother  Deceased    ROS:  Please see the history of present illness.  All other ROS reviewed and negative.     Physical Exam/Data:   Vitals:   08/21/17 1500 08/21/17 1531 08/21/17 1537  BP: 121/65  121/65  Pulse:   62  Resp:   18  Temp: 98.1 F (36.7 C)  98.1 F (36.7 C)  TempSrc: Oral  Oral  Weight: 80.4 kg 80.4 kg 80.4 kg  Height: _0  (1.778 m) _1  (1.778 m) _2   (1.778 m)   No intake or output data in the 24 hours ending 08/21/17 1647 Filed Weights   08/21/17 1500 08/21/17 1531 08/21/17 1537  Weight: 80.4 kg 80.4 kg 80.4 kg   Body mass index is 25.43 kg/m.  General:  Well nourished, well developed, in no acute distress HEENT: normal Lymph: no adenopathy Neck: no JVD Endocrine:  No thryomegaly Vascular: No carotid bruits; 4/4 extremity pulses 2+, without bruits  Cardiac:  normal S1, S2; RRR; 2/6 murmur  Lungs: Decreased breath sounds bases bilaterally, no wheezing, rhonchi or rales  Abd: soft, nontender, no hepatomegaly  Ext: no edema Musculoskeletal:  No deformities, BUE and BLE strength normal and equal Skin: warm and dry  Neuro:  CNs 2-12 intact, no focal abnormalities noted Psych:  Normal affect   EKG:  The EKG was personally reviewed and demonstrates: Sinus rhythm, no acute ischemic changes Telemetry:  Telemetry was personally reviewed and demonstrates: Sinus rhythm, no significant ectopy  Relevant CV Studies:  CATH: 07/20/2014 Interventional Cath Status: Urgent  Procedure  Procedure Type  PCI procedure: Stent DES, Stent BMS  Complications: No Complications.  Conclusions  PCI of SVG to PDA with Ultra 5.0 x 18 mm stent to mid lesion, Xience 3.5 x 15  mm stent to proximal vessle.  Signatures  Electronically signed by Kerry Dory, MD(Interventional  Physician) on 07/21/2014 09:03  Angiographic Findings  Cardiac Arteries and Lesion Findings  LAD:  Lesion on Dist LAD: 100% stenosis.  Lesion on 1st Diag: 90% stenosis.  LCx:  Lesion on Prox CX: Proximal subsection.100% stenosis.  RCA:  Lesion on Prox RCA: 80% stenosis.  Lesion on Dist RCA: 80% stenosis.  Lesion on R PDA: 100% stenosis.  Graft Lesions  Lesion on R PDA to R PDA:  Devices used  - Abbott 0.014" x 190cm BMW J-Tip PTCI Guidewire. Number of passes: 1.  - 0.014 x 190cm SPIDERFX 5.53m. Number of passes: 1.  - 3.0  mm X 15 mm Trek RX. 1 inflation(s) to a max pressure of: 10 atm.  - 5X18 ULTRA RX STENT (ACC_23). 1 inflation(s) to a max pressure of: 12  atm.  - Xience Alpine 3.5x154mEverolimus DES. 1 inflation(s) to a max pressure  of: 18 atm.  Cardiac Grafts  -There is a Vein graft that originates at the R PDA and attaches to the R  PDA.  -There is a Vein graft that originates at the Undefined1 and attaches to  the R PDA.  -There is a Vein graft that originates at the Aorta Right and attaches to  the R PDA.  -There is a LIMA graft that originates at the LIMA and attaches to the  Dist LAD.  Procedure Data  Procedure Date  Date: 07/20/2014 Start: 14:53  Contrast Material  - Omnipaque163 ml  Fluoroscopy Time: PCI: 11:03 minutes. Total: 11:03 minutes.  Medical History  Allergies  - PENICILLINS allergy. Reaction: Other->unknown reaction. Sensitivity:  Allergy.  - BACITRACIN allergy. Reaction: Genitourinary->burning sensation.  Sensitivity: Allergy.  - Iodine-Iodine Containing allergy. Reaction: Skin->pruritus. Sensitivity:  Allergy.  - NEOMYCIN allergy. Reaction: Genitourinary->burning sensation. Sensitivity:  Allergy.  - POLYMYXIN B allergy. Reaction: Genitourinary->burning sensation.  Sensitivity: Allergy.  Admission Data  Admission Date: 07/20/2014  Admission Status: Emergency department  Coronary Tree  Dominance: Right  VA  LV function assessed asKP:TWSFKCLE Ejection Fraction  Method: LV gram. EF%: 45.  LVA Segment Contractility  1 - Normal 3 - Mild5 - Severe7 - Dyskinesis  hypokinesis hypokinesis  2 - Hypokinesis4 - Moderate6 - Akinesis8 - Aneurysm  hypokinesis  Hemodynamics  Condition: Rest  Heart Rate: 69 bpm  Pressures  +-----+------------+  !Site !Pressure!  +-----+------------+  !AO !152/76 (108)!    +-----+------------+  !LV !159/5 ,25 !  +-----+------------+  !AO !162/77 (115)!  +-----+------------+  !LV !159/7 ,27 !  +-----+------------+  Valve Gradients and Areas  +------+----+----+----+-----+----+------+  !Valve !Peak!Mean!Area!Index!Flow!Source!  +------+----+----+----+-----+----+------+  !Aortic!0 !0 !! !!!  +------+----+----+----+-----+----+------+  !Aortic!0 !0 !! !!!  +------+----+----+----+-----+----+------+  Shunts  Oxygen Values  O2 Capacity164.56  EMC RAD      Laboratory Data: Today, sodium 134, potassium 3.8, BUN 12, creatinine 1.2, LFTs within normal limits, troponin peak 0.26  ChemistryNo results for input(s): NA, K, CL, CO2, GLUCOSE, BUN, CREATININE, CALCIUM, GFRNONAA, GFRAA, ANIONGAP in the last 168 hours.  No results found for: ALT, AST, GGT, ALKPHOS, BILITOT Hematology Recent Labs  Lab 08/21/17 1539  WBC 6.3  RBC 3.62*  HGB 12.1*  HCT 35.7*  MCV 98.6  MCH 33.4  MCHC 33.9  RDW 12.8  PLT 216    Radiology/Studies:  No results found.  Assessment and Plan:   Principal Problem: 1.  Non-ST elevation [facility]Symptoms improved with nitroglycerin and morphine. -Patient states the symptoms are similar to his non-STEMI in 2016. - Dr. NaAcie Fredricksoneviewed the situation with Austin. Malone.  Cardiac catheterization is indicated to further define his anatomy. The risks and benefits of a cardiac catheterization including, but not limited to, death, stroke, MI, kidney damage and bleeding were discussed with the patient who indicates understanding and agrees to proceed. - Pretreatment for dye allergy is ordered with Solu-Medrol, IV Benadryl and IV Pepcid  2.  Hypertension: Continue home medications  3.  Hyperlipidemia: Check a profile in a.m.  Otherwise, continue home meds   For questions or updates, please contact [place] Please consult  [email or web address] for contact info under Cardiology/STEMI.   Signed, Rosaria Ferries, PA-C  08/21/2017 4:47 PM   Attending Note:   The patient was seen and examined.  Agree with assessment and plan as noted above.  Changes made to the above note as needed.  Patient seen and independently examined with Rosaria Ferries, PA .   We discussed all aspects of the encounter. I agree with the assessment and plan as stated above.  1.  Unstable angina: The patient presents now with symptoms of unstable angina for the past several weeks.  It seems to be progressive.  He presented to [facility] and became pain-free after morphine and nitroglycerin.   Troponin levels are 0.27.  He was transferred to [facility] for further evaluation.   And is pain-free at this point.  I think that he needs a heart catheterization for further evaluation and to define his anatomy.  We discussed the risks, benefits, options of heart catheterization.  He understands and agrees to proceed.  He has a dye allergy.  We will pretreat him with steroids, Benadryl, IV Pepcid    I have spent a total of 40 minutes with patient reviewing hospital  notes , telemetry, EKGs, labs and examining patient as well as establishing an assessment and plan that was discussed with the patient. > 50% of time was spent in direct patient care.    Thayer Headings, Brooke Bonito., MD, [facility] 08/21/2017, 5:02 PM [address],  [place] Pager [phone]

## 2017-08-21 NOTE — Progress Notes (Signed)
Troponin of 0.12 called to me, patient is in route to cath lab

## 2017-08-21 NOTE — H&P (Addendum)
History and Physical    Austin Malone WUJ:[id number] DOB: 04/19/1940 DOA: 08/21/2017  Referring MD/NP/PA: Stephania Fragmin, MD PCP: Shelbie Ammons, MD  Patient coming from:[facility] transfer  Chief Complaint: Chest pain  I have personally briefly reviewed patient's old medical records in [facility]   HPI: Austin Malone is a 77 y.o. male with medical history significant of HTN, HLD, CAD s/p CABG in 2008 ,s/p stent 2016, CHF, and BPH; who presented to [facility] with complaints of chest pain on 8/8.  Patient complained of chest pressure symptoms that woke him up the night before out of his sleep.  Pain was felt across his chest and possibly down both of his arms. He tried taking nitroglycerin several times without relief of pain.  He called EMS and took 4 baby aspirin as instructed.  Associated symptoms include lower extremity swelling and weight gain of 10 pounds over the last 2months.  Denies any significant orthopnea, fever, chills,diaphoresis,  or cough symptoms.  Had just been seen by his cardiologist Dr. Tomie China on 8/6.   In the [place] ER the patient's vital signs were stable and his lab work was mostly unremarkable. EKG did not show any acute ST-T changes. Given his high risk he was admitted for cardiac workup. During his hospitalization patient remained chest pain free but his troponin trended up to 0.26, and then trended down to 0.22. EKG remained unremarkable. No acute events on telemetry. After patient was evaluated by Cardiology they recommended patient would benefit from left heart catheterization versus stress test. Case was discussed by Dr Joylene John from Cardiology here to on-call cardiologist at [facility] who agreed to see the patient in consultation for likely left heart catheterization.  ED Course:  As seen above  Review of Systems  Constitutional: Positive for malaise/fatigue. Negative for chills and fever.  HENT: Negative for ear discharge and nosebleeds.     Eyes: Negative for photophobia and pain.  Respiratory: Negative for cough, sputum production and shortness of breath.   Cardiovascular: Positive for chest pain and leg swelling. Negative for orthopnea.  Gastrointestinal: Negative for abdominal pain, diarrhea and vomiting.  Genitourinary: Negative for dysuria and hematuria.  Musculoskeletal: Negative for falls.  Neurological: Negative for speech change and loss of consciousness.  Psychiatric/Behavioral: Negative for memory loss and substance abuse.    Past Medical History:  Diagnosis Date  . Anxiety   . Arthritis   . CAD (coronary artery disease)   . Cataracts, bilateral   . Glaucoma   . Hyperlipidemia   . Hypertension   . Pneumonia   . PUD (peptic ulcer disease)   . Rotator cuff tear     Past Surgical History:  Procedure Laterality Date  . APPENDECTOMY    . CORONARY ARTERY BYPASS GRAFT  2008  . EYE SURGERY    . heart stent    . LEG SURGERY    . TONSILLECTOMY       reports that he has quit smoking. He has never used smokeless tobacco. His alcohol and drug histories are not on file.  Allergies  Allergen Reactions  . Codeine Other (See Comments)  . Lisinopril Other (See Comments)    cough    No family history on file.  Prior to Admission medications   Medication Sig Start Date End Date Taking? Authorizing Provider  alfuzosin (UROXATRAL) 10 MG 24 hr tablet Take 1 tablet by mouth daily.    [provider]  ALPRAZolam Prudy Feeler) 0.5 MG tablet Take 1 tablet  by mouth daily. 05/10/14   [provider]  aspirin EC 81 MG tablet Take 81 mg by mouth daily.    [provider]  bethanechol (URECHOLINE) 50 MG tablet Take 50 mg by mouth 3 (three) times daily.    [provider]  carvedilol (COREG) 6.25 MG tablet Take 1 tablet by mouth 2 (two) times daily. 09/01/16   [provider]  Cholecalciferol (VITAMIN D-1000 MAX ST) 1000 units tablet Take 1 capsule by mouth daily.    [provider]  Coenzyme Q10 100 MG capsule Take 1 capsule by mouth daily.    [provider]  dorzolamide-timolol (COSOPT) 22.3-6.8 MG/ML ophthalmic solution Place 1 drop into both eyes 2 times daily. 05/03/14   [provider]  finasteride (PROSCAR) 5 MG tablet Take 1 tablet by mouth daily.    [provider]  furosemide (LASIX) 20 MG tablet Take 1 tablet by mouth daily. 07/30/17   [provider]  Melatonin 3 MG TABS Take 1 tablet by mouth daily. 07/29/17   [provider]  nitroGLYCERIN (NITROSTAT) 0.4 MG SL tablet Place 0.4 mg under the tongue every 5 (five) minutes as needed. 03/10/16   [provider]  omega-3 acid ethyl esters (LOVAZA) 1 g capsule Take 2 g by mouth daily.    [provider]  ondansetron (ZOFRAN) 8 MG tablet Take by mouth every 8 (eight) hours as needed for nausea or vomiting.    [provider]  pantoprazole (PROTONIX) 40 MG tablet Take 1 tablet by mouth daily.    [provider]  ranolazine (RANEXA) 1000 MG SR tablet Take 1 tablet by mouth 2 (two) times daily. 02/25/16   [provider]  TRAVATAN Z 0.004 % SOLN ophthalmic solution PLACE 1 DROP IN EACH EYE EVERY EVENING FOR 30 DAYS. MAY USE ADDITIONAL DROP(S) DUE TO DEXTERITY issuE. 06/24/17   [provider]  vitamin C (ASCORBIC ACID) 500 MG tablet Take 1 tablet by mouth daily.    [provider]    Physical Exam:  Constitutional: Elderly male in NAD, calm, comfortable Vitals:   08/21/17 1500  BP: 121/65  Temp: 98.1 F (36.7 C)  TempSrc: Oral  Weight: 80.4 kg  Height: 5\' 10"  (1.778 m)   Eyes: PERRL, lids and conjunctivae normal ENMT: Mucous membranes are moist. Posterior pharynx clear of any exudate or lesions.  Neck: normal, supple, no masses, no thyromegaly Respiratory: Respiratory effort with positive crackles appreciated throughout mid to lower lung fields bilaterally.  No significant wheezes or rhonchi  appreciated. Cardiovascular: Regular rate and rhythm, no murmurs / rubs / gallops.  +1 pitting lower extremity edema. 2+ pedal pulses. No carotid bruits.  Abdomen: no tenderness, no masses palpated. No hepatosplenomegaly. Bowel sounds positive.  Musculoskeletal: no clubbing / cyanosis. No joint deformity upper and lower extremities. Good ROM, no contractures. Normal muscle tone.  Skin: no rashes, lesions, ulcers. No induration Neurologic: CN 2-12 grossly intact. Sensation intact, DTR normal. Strength 5/5 in all 4.  Psychiatric: Normal judgment and insight. Alert and oriented x 3. Normal mood.     Labs on Admission: I have personally reviewed following labs and imaging studies  CBC: No results for input(s): WBC, NEUTROABS, HGB, HCT, MCV, PLT in the last 168 hours. Basic Metabolic Panel: No results for input(s): NA, K, CL, CO2, GLUCOSE, BUN, CREATININE, CALCIUM, MG, PHOS in the last 168 hours. GFR: CrCl cannot be calculated (No successful lab value found.). Liver Function Tests: No results for  input(s): AST, ALT, ALKPHOS, BILITOT, PROT, ALBUMIN in the last 168 hours. No results for input(s): LIPASE, AMYLASE in the last 168 hours. No results for input(s): AMMONIA in the last 168 hours. Coagulation Profile: No results for input(s): INR, PROTIME in the last 168 hours. Cardiac Enzymes: No results for input(s): CKTOTAL, CKMB, CKMBINDEX, TROPONINI in the last 168 hours. BNP (last 3 results) No results for input(s): PROBNP in the last 8760 hours. HbA1C: No results for input(s): HGBA1C in the last 72 hours. CBG: No results for input(s): GLUCAP in the last 168 hours. Lipid Profile: No results for input(s): CHOL, HDL, LDLCALC, TRIG, CHOLHDL, LDLDIRECT in the last 72 hours. Thyroid Function Tests: No results for input(s): TSH, T4TOTAL, FREET4, T3FREE, THYROIDAB in the last 72 hours. Anemia Panel: No results for input(s): VITAMINB12, FOLATE, FERRITIN, TIBC, IRON, RETICCTPCT in the last 72  hours. Urine analysis: No results found for: COLORURINE, APPEARANCEUR, LABSPEC, PHURINE, GLUCOSEU, HGBUR, BILIRUBINUR, KETONESUR, PROTEINUR, UROBILINOGEN, NITRITE, LEUKOCYTESUR Sepsis Labs: No results found for this or any previous visit (from the past 240 hour(s)).   Radiological Exams on Admission: No results found.  EKG: Independently reviewed.   Assessment/Plan NSTEMI, H/O CAD: Acute.  Patient presented initially to the outside facility with chest pain and troponins peaking at 0.27 at the outside facility.  Patient with previous history of CAD s/p CABG in 2008 and stent placement in 2016. - Admit to telemetry bed - NPO, for possible need of cardiac cath - Trend cardiac troponins - Cardiology consulted, will follow-up for further recommendation  Congestive heart failure exacerbation with pulmonary edema: Acute.  Patient reports having 10 pound weight gain over the last 2 months with lower extremity swelling.  Chest x-ray at the outside facility revealed bilateral pulmonary infiltrates suggestive of edema.  Patient had been given 40 mg of Lasix IV at the outside facility.  - Focused heart failure orders set  initiated  - Continuous pulse oximetry with nasal cannula oxygen as needed to keep O2 saturations >92% - Strict I&Os and daily weights - Diuresis per cardiology - Check echocardiogram - Optimize medical management as able  HLD - continue statin  HTN - Continue Coreg  Anxiety - Continue Xanax as needed  BPH  - Continue home regimen  Alcohol abuse:Patient reported drinking 3-4 beers daily. - CWIA protocols  DVT prophylaxis: SCDs Code Status: DNR Family Communication: No family present at bedside Disposition Plan: Likely discharge home in 2 to 3 days Consults called: Gastroenterology Admission status: inpatient  Clydie Braun MD [facility] Pager [phone]   If 7PM-7AM, please contact night-coverage [email or web address] Password [email or web address]  08/21/2017, 3:16 PM

## 2017-08-21 NOTE — Consult Note (Addendum)
Cardiology Consultation:   Patient ID: Austin Malone; [id number]; 02/03/1940   Admit date: 08/21/2017 Date of Consult: 08/21/2017  Primary Care Provider: Haque, Imran P, MD Primary Cardiologist: Dr Revankar, 08/18/2017 Primary Electrophysiologist:  n/a   Patient Profile:   Austin Malone is a 77 y.o. male with a hx of CABG x 1 in 2008 w/ LIMA-LAD, SVG-?, SVG-? SVG-PDA,, 07/2014 NSTEMI at [facility] with DES SVG-PDA, angina 08/2017 w/ stress test recommended, HTN, HLD, PUD, OA, SIADH, BPH on 3 meds, GERD, who is being seen today for the evaluation of chest pain at the request of Dr Smith.  Nuclear stress test 2017 showed ischemia involving the inferolateral wall and global hypokinesis with inferior akinesis.  History of Present Illness:   Austin Malone was admitted to [facility] 07/9-07/17/2019 for UTI>>urinary retention>>bilat hydro-ureteronephrosis>>IV ceftriaxone, cx + coag neg Staph>>oral Keflex, ARI improved, Na 129, improved at d/c  Austin Malone was seen by Dr Revankar on 08/06. He was complaining of exertional chest pain. He has been treated for this in the past w/ BB and Ranexa. He was having CP approx 2 x week, usually at night. He was given rx for SL NTG.   Weds pm, he had onset of CP, across his chest, hard pressure, 10/10 at its worst. He would take nitro>>pain-free, but it kept coming back, about every hour. After he ran out of nitro, he came to [facility]. No SOB or nausea, no diaphoresis. He got morphine for the pain.   His cardiac enzymes were elevated.  He also had some heart failure which was treated with IV Lasix.  His symptoms improved.  The case was discussed with cardiology at [facility] and with cardiology at [facility].  It was felt that the best option was to transfer him to [facility] and decide on stress testing versus cath here.  Currently, Austin Malone is pain-free.  He is resting comfortably.  He denies shortness of breath.   Past Medical History:  Diagnosis Date  . Anxiety    . Arthritis   . BPH (benign prostatic hyperplasia)   . CAD (coronary artery disease)   . Cataracts, bilateral   . Enlarged prostate   . GERD (gastroesophageal reflux disease)   . Glaucoma   . Hyperlipidemia   . Hypertension   . Non-ST elevation (NSTEMI) myocardial infarction (HCC) 08/21/2017  . Pneumonia   . PUD (peptic ulcer disease)   . Rotator cuff tear     Past Surgical History:  Procedure Laterality Date  . APPENDECTOMY    . CORONARY ANGIOPLASTY WITH STENT PLACEMENT  11/2014   PCI of SVG to PDA x 2 with Xience DES to proximal stenosis, Ultra 5.0 BMS to distal thrombotic lesion  . CORONARY ARTERY BYPASS GRAFT  2008  . EYE SURGERY    . LEG SURGERY    . TONSILLECTOMY       Prior to Admission medications   Medication Sig Start Date End Date Taking? Authorizing Provider  alfuzosin (UROXATRAL) 10 MG 24 hr tablet Take 1 tablet by mouth daily.    [provider]  ALPRAZolam (XANAX) 0.5 MG tablet Take 1 tablet by mouth daily. 05/10/14   [provider]  aspirin EC 81 MG tablet Take 81 mg by mouth daily.    [provider]  bethanechol (URECHOLINE) 50 MG tablet Take 50 mg by mouth 3 (three) times daily.    [provider]  carvedilol (COREG) 6.25 MG tablet Take 1 tablet by mouth 2 (two) times daily. 09/01/16     [provider]  Cholecalciferol (VITAMIN D-1000 MAX ST) 1000 units tablet Take 1 capsule by mouth daily.    [provider]  Coenzyme Q10 100 MG capsule Take 1 capsule by mouth daily.    [provider]  dorzolamide-timolol (COSOPT) 22.3-6.8 MG/ML ophthalmic solution Place 1 drop into both eyes 2 times daily. 05/03/14   [provider]  finasteride (PROSCAR) 5 MG tablet Take 1 tablet by mouth daily.    [provider]  furosemide (LASIX) 20 MG tablet Take 1 tablet by mouth daily. 07/30/17   [provider]  Melatonin 3 MG TABS Take 1 tablet by mouth daily. 07/29/17   [provider]    nitroGLYCERIN (NITROSTAT) 0.4 MG SL tablet Place 0.4 mg under the tongue every 5 (five) minutes as needed. 03/10/16   [provider]  omega-3 acid ethyl esters (LOVAZA) 1 g capsule Take 2 g by mouth daily.    [provider]  ondansetron (ZOFRAN) 8 MG tablet Take by mouth every 8 (eight) hours as needed for nausea or vomiting.    [provider]  pantoprazole (PROTONIX) 40 MG tablet Take 1 tablet by mouth daily.    [provider]  ranolazine (RANEXA) 1000 MG SR tablet Take 1 tablet by mouth 2 (two) times daily. 02/25/16   [provider]  TRAVATAN Z 0.004 % SOLN ophthalmic solution PLACE 1 DROP IN EACH EYE EVERY EVENING FOR 30 DAYS. MAY USE ADDITIONAL DROP(S) DUE TO DEXTERITY issuE. 06/24/17   [provider]  vitamin C (ASCORBIC ACID) 500 MG tablet Take 1 tablet by mouth daily.    [provider]    Inpatient Medications: Scheduled Meds: . sodium chloride   Intravenous Once  . [START ON 08/22/2017] aspirin  81 mg Oral Pre-Cath  . diphenhydrAMINE  25 mg Intravenous Once  . furosemide  40 mg Intravenous BID  . methylPREDNISolone (SOLU-MEDROL) injection  125 mg Intravenous Once  . methylPREDNISolone sodium succinate      . sodium chloride flush  3 mL Intravenous Q12H  . sodium chloride flush  3 mL Intravenous Q12H   Continuous Infusions: . sodium chloride    . sodium chloride     Followed by  . sodium chloride    . famotidine (PEPCID) IV     PRN Meds: sodium chloride, acetaminophen **OR** acetaminophen, albuterol, ondansetron **OR** ondansetron (ZOFRAN) IV, sodium chloride flush  Allergies:    Allergies  Allergen Reactions  . Codeine Other (See Comments)    unknown  . Lisinopril Cough  . Contrast Media [Iodinated Diagnostic Agents] Hives and Rash    Social History:   Social History   Socioeconomic History  . Marital status: Married    Spouse name: Not on file  . Number of children: Not on file  . Years of  education: Not on file  . Highest education level: Not on file  Occupational History  . Occupation: Retired  Social Needs  . Financial resource strain: Not on file  . Food insecurity:    Worry: Not on file    Inability: Not on file  . Transportation needs:    Medical: Not on file    Non-medical: Not on file  Tobacco Use  . Smoking status: Former Smoker  . Smokeless tobacco: Never Used  Substance and Sexual Activity  . Alcohol use: Yes    Alcohol/week: 21.0 standard drinks    Types: 21 Cans of beer per week  . Drug use: Yes      Types: Marijuana  . Sexual activity: Not on file  Lifestyle  . Physical activity:    Days per week: Not on file    Minutes per session: Not on file  . Stress: Not on file  Relationships  . Social connections:    Talks on phone: Not on file    Gets together: Not on file    Attends religious service: Not on file    Active member of club or organization: Not on file    Attends meetings of clubs or organizations: Not on file    Relationship status: Not on file  . Intimate partner violence:    Fear of current or ex partner: Not on file    Emotionally abused: Not on file    Physically abused: Not on file    Forced sexual activity: Not on file  Other Topics Concern  . Not on file  Social History Narrative   He lives in [place], [place] alone.    Family History:   Family History  Problem Relation Age of Onset  . Diabetes Mother   . Heart attack Brother    Family Status:  Family Status  Relation Name Status  . Mother  Deceased  . Father  Deceased  . Brother  Deceased    ROS:  Please see the history of present illness.  All other ROS reviewed and negative.     Physical Exam/Data:   Vitals:   08/21/17 1500 08/21/17 1531 08/21/17 1537  BP: 121/65  121/65  Pulse:   62  Resp:   18  Temp: 98.1 F (36.7 C)  98.1 F (36.7 C)  TempSrc: Oral  Oral  Weight: 80.4 kg 80.4 kg 80.4 kg  Height: 5' 10" (1.778 m) 5' 10" (1.778 m) 5' 10"  (1.778 m)   No intake or output data in the 24 hours ending 08/21/17 1647 Filed Weights   08/21/17 1500 08/21/17 1531 08/21/17 1537  Weight: 80.4 kg 80.4 kg 80.4 kg   Body mass index is 25.43 kg/m.  General:  Well nourished, well developed, in no acute distress HEENT: normal Lymph: no adenopathy Neck: no JVD Endocrine:  No thryomegaly Vascular: No carotid bruits; 4/4 extremity pulses 2+, without bruits  Cardiac:  normal S1, S2; RRR; 2/6 murmur  Lungs: Decreased breath sounds bases bilaterally, no wheezing, rhonchi or rales  Abd: soft, nontender, no hepatomegaly  Ext: no edema Musculoskeletal:  No deformities, BUE and BLE strength normal and equal Skin: warm and dry  Neuro:  CNs 2-12 intact, no focal abnormalities noted Psych:  Normal affect   EKG:  The EKG was personally reviewed and demonstrates: Sinus rhythm, no acute ischemic changes Telemetry:  Telemetry was personally reviewed and demonstrates: Sinus rhythm, no significant ectopy  Relevant CV Studies:  CATH: 07/20/2014 Interventional Cath Status: Urgent  Procedure  Procedure Type  PCI procedure: Stent DES, Stent BMS  Complications: No Complications.  Conclusions  PCI of SVG to PDA with Ultra 5.0 x 18 mm stent to mid lesion, Xience 3.5 x 15  mm stent to proximal vessle.  Signatures  Electronically signed by Joseph Rossi, MD(Interventional  Physician) on 07/21/2014 09:03  Angiographic Findings  Cardiac Arteries and Lesion Findings  LAD:  Lesion on Dist LAD: 100% stenosis.  Lesion on 1st Diag: 90% stenosis.  LCx:  Lesion on Prox CX: Proximal subsection.100% stenosis.  RCA:  Lesion on Prox RCA: 80% stenosis.  Lesion on Dist RCA: 80% stenosis.  Lesion on R PDA: 100% stenosis.    Graft Lesions  Lesion on R PDA to R PDA:  Devices used  - Abbott 0.014" x 190cm BMW J-Tip PTCI Guidewire. Number of passes: 1.  - 0.014 x 190cm SPIDERFX 5.0mm. Number of passes: 1.  - 3.0  mm X 15 mm Trek RX. 1 inflation(s) to a max pressure of: 10 atm.  - 5X18 ULTRA RX STENT (ACC_23). 1 inflation(s) to a max pressure of: 12  atm.  - Xience Alpine 3.5x15mm Everolimus DES. 1 inflation(s) to a max pressure  of: 18 atm.  Cardiac Grafts  -There is a Vein graft that originates at the R PDA and attaches to the R  PDA.  -There is a Vein graft that originates at the Undefined1 and attaches to  the R PDA.  -There is a Vein graft that originates at the Aorta Right and attaches to  the R PDA.  -There is a LIMA graft that originates at the LIMA and attaches to the  Dist LAD.  Procedure Data  Procedure Date  Date: 07/20/2014 Start: 14:53  Contrast Material  - Omnipaque163 ml  Fluoroscopy Time: PCI: 11:03 minutes. Total: 11:03 minutes.  Medical History  Allergies  - PENICILLINS allergy. Reaction: Other->unknown reaction. Sensitivity:  Allergy.  - BACITRACIN allergy. Reaction: Genitourinary->burning sensation.  Sensitivity: Allergy.  - Iodine-Iodine Containing allergy. Reaction: Skin->pruritus. Sensitivity:  Allergy.  - NEOMYCIN allergy. Reaction: Genitourinary->burning sensation. Sensitivity:  Allergy.  - POLYMYXIN B allergy. Reaction: Genitourinary->burning sensation.  Sensitivity: Allergy.  Admission Data  Admission Date: 07/20/2014  Admission Status: Emergency department  Coronary Tree  Dominance: Right  VA  LV function assessed as:Abnormal.  Ejection Fraction  Method: LV gram. EF%: 45.  LVA Segment Contractility  1 - Normal 3 - Mild5 - Severe7 - Dyskinesis  hypokinesis hypokinesis  2 - Hypokinesis4 - Moderate6 - Akinesis8 - Aneurysm  hypokinesis  Hemodynamics  Condition: Rest  Heart Rate: 69 bpm  Pressures  +-----+------------+  !Site !Pressure!  +-----+------------+  !AO !152/76 (108)!    +-----+------------+  !LV !159/5 ,25 !  +-----+------------+  !AO !162/77 (115)!  +-----+------------+  !LV !159/7 ,27 !  +-----+------------+  Valve Gradients and Areas  +------+----+----+----+-----+----+------+  !Valve !Peak!Mean!Area!Index!Flow!Source!  +------+----+----+----+-----+----+------+  !Aortic!0 !0 !! !!!  +------+----+----+----+-----+----+------+  !Aortic!0 !0 !! !!!  +------+----+----+----+-----+----+------+  Shunts  Oxygen Values  O2 Capacity164.56  EMC RAD      Laboratory Data: Today, sodium 134, potassium 3.8, BUN 12, creatinine 1.2, LFTs within normal limits, troponin peak 0.26  ChemistryNo results for input(s): NA, K, CL, CO2, GLUCOSE, BUN, CREATININE, CALCIUM, GFRNONAA, GFRAA, ANIONGAP in the last 168 hours.  No results found for: ALT, AST, GGT, ALKPHOS, BILITOT Hematology Recent Labs  Lab 08/21/17 1539  WBC 6.3  RBC 3.62*  HGB 12.1*  HCT 35.7*  MCV 98.6  MCH 33.4  MCHC 33.9  RDW 12.8  PLT 216    Radiology/Studies:  No results found.  Assessment and Plan:   Principal Problem: 1.  Non-ST elevation (NSTEMI) myocardial infarction (HCC) -Symptoms improved with nitroglycerin and morphine. -Patient states the symptoms are similar to his non-STEMI in 2016. - Dr. Sheana Bir reviewed the situation with Austin Malone.  Cardiac catheterization is indicated to further define his anatomy. The risks and benefits of a cardiac catheterization including, but not limited to, death, stroke, MI, kidney damage and bleeding were discussed with the patient who indicates understanding and agrees to proceed. - Pretreatment for dye allergy is ordered with Solu-Medrol, IV Benadryl and IV Pepcid  2.  Hypertension: Continue home medications    3.  Hyperlipidemia: Check a profile in a.m.  Otherwise, continue home meds   For questions or updates, please contact [facility] Please consult  [email or web address] for contact info under Cardiology/STEMI.   Signed, Rhonda Barrett, PA-C  08/21/2017 4:47 PM   Attending Note:   The patient was seen and examined.  Agree with assessment and plan as noted above.  Changes made to the above note as needed.  Patient seen and independently examined with Rhonda Barrett, PA .   We discussed all aspects of the encounter. I agree with the assessment and plan as stated above.  1.  Unstable angina: The patient presents now with symptoms of unstable angina for the past several weeks.  It seems to be progressive.  He presented to [facility] and became pain-free after morphine and nitroglycerin.   Troponin levels are 0.27.  He was transferred to [facility] for further evaluation.   And is pain-free at this point.  I think that he needs a heart catheterization for further evaluation and to define his anatomy.  We discussed the risks, benefits, options of heart catheterization.  He understands and agrees to proceed.  He has a dye allergy.  We will pretreat him with steroids, Benadryl, IV Pepcid    I have spent a total of 40 minutes with patient reviewing hospital  notes , telemetry, EKGs, labs and examining patient as well as establishing an assessment and plan that was discussed with the patient. > 50% of time was spent in direct patient care.    Kailah Pennel J. Sharief Wainwright, Jr., MD, FACC 08/21/2017, 5:02 PM [address],  [address] Office - [phone] Pager [phone]     

## 2017-08-21 NOTE — Interval H&P Note (Signed)
History and Physical Interval Note:  08/21/2017 5:19 PM  Austin BibleJames Malone  has presented today for cardiac catheterization, with the diagnosis of NSTEMI. The various methods of treatment have been discussed with the patient and family. After consideration of risks, benefits and other options for treatment, the patient has consented to  Procedure(s): LEFT HEART CATH AND CORS/GRAFTS ANGIOGRAPHY (N/A) as a surgical intervention .  The patient's history has been reviewed, patient examined, no change in status, stable for surgery.  I have reviewed the patient's chart and labs.  Questions were answered to the patient's satisfaction.  The patient has a DNR order, which he has agreed to rescind for the duration of this procedure.  Cath Lab Visit (complete for each Cath Lab visit)  Clinical Evaluation Leading to the Procedure:   ACS: Yes.    Non-ACS:  N/A  Takeesha Isley

## 2017-08-21 NOTE — Progress Notes (Addendum)
ANTICOAGULATION CONSULT NOTE - Initial Consult  Pharmacy Consult for Heparin Indication: NSTEMI  Allergies  Allergen Reactions  . Codeine Other (See Comments)    unknown  . Lisinopril Cough  . Contrast Media [Iodinated Diagnostic Agents] Hives and Rash    Patient Measurements: Height: 5\' 10"  (177.8 cm) Weight: 177 lb 3.2 oz (80.4 kg) IBW/kg (Calculated) : 73 Heparin Dosing Weight: 80 kg  Vital Signs: Temp: 98.1 F (36.7 C) (08/09 1941) Temp Source: Axillary (08/09 1941) BP: 158/71 (08/09 1941) Pulse Rate: 77 (08/09 1941)  Labs: Recent Labs    08/21/17 1539 08/21/17 1556  HGB 12.1*  --   HCT 35.7*  --   PLT 216  --   TROPONINI  --  0.12*   [facility].90 [facility] on 8/8, then 1.20 on 08/21/17. PT 9.8 seconds, INR 1.0, aPTT 25.[facility]  Medical History: Past Medical History:  Diagnosis Date  . Anxiety   . Arthritis   . BPH (benign prostatic hyperplasia)   . CAD (coronary artery disease)   . Cataracts, bilateral   . Enlarged prostate   . GERD (gastroesophageal reflux disease)   . Glaucoma   . Hyperlipidemia   . Hypertension   . Non-ST elevation (NSTEMI) myocardial infarction (HCC) 08/21/2017  . Pneumonia   . PUD (peptic ulcer disease)   . Rotator cuff tear    Assessment:  77 yr old male to begin IV heparin for NSTEMI 2 hrs after TR band off post-cath.  [facility].  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 Planning medical therapy for progression of RCA disease and IV heparin for 24-48hrs for NSTEMI.  Plavix load given tonight and to continue 75 mg daily.  [facility], but patient reports off this Effient for some time and also off statin.     TR band deflation in process.  No bleeding or hematoma.  Goal of Therapy:  Heparin level 0.3-0.7 units/ml Monitor platelets by anticoagulation protocol: Yes   Plan:   Heparin drip to begin at 1150 units/hr 2 hrs after TR band  removal.  Heparin level ~6 hrs after drip begins.  Daily heparin level and CBC while on heparin.  Dennie Fettersgan, Theresa Donovan, RPh Pager: [phone] 08/21/2017,10:36 PM   Addendum: TR band off at 0100; will start heparin gtt at 0300. Vernard GamblesVeronda Fantasy Donald, PharmD, BCPS 08/22/2017 12:58 AM

## 2017-08-22 ENCOUNTER — Inpatient Hospital Stay (HOSPITAL_COMMUNITY): Payer: Medicare HMO

## 2017-08-22 DIAGNOSIS — I340 Nonrheumatic mitral (valve) insufficiency: Secondary | ICD-10-CM

## 2017-08-22 DIAGNOSIS — I351 Nonrheumatic aortic (valve) insufficiency: Secondary | ICD-10-CM

## 2017-08-22 DIAGNOSIS — I214 Non-ST elevation (NSTEMI) myocardial infarction: Principal | ICD-10-CM

## 2017-08-22 DIAGNOSIS — F1010 Alcohol abuse, uncomplicated: Secondary | ICD-10-CM

## 2017-08-22 DIAGNOSIS — F419 Anxiety disorder, unspecified: Secondary | ICD-10-CM | POA: Diagnosis present

## 2017-08-22 HISTORY — DX: Alcohol abuse, uncomplicated: F10.10

## 2017-08-22 LAB — CBC
HEMATOCRIT: 33.9 % — AB (ref 39.0–52.0)
HEMOGLOBIN: 11.6 g/dL — AB (ref 13.0–17.0)
MCH: 33.8 pg (ref 26.0–34.0)
MCHC: 34.2 g/dL (ref 30.0–36.0)
MCV: 98.8 fL (ref 78.0–100.0)
Platelets: 237 10*3/uL (ref 150–400)
RBC: 3.43 MIL/uL — ABNORMAL LOW (ref 4.22–5.81)
RDW: 12.5 % (ref 11.5–15.5)
WBC: 6.7 10*3/uL (ref 4.0–10.5)

## 2017-08-22 LAB — BASIC METABOLIC PANEL
ANION GAP: 12 (ref 5–15)
BUN: 16 mg/dL (ref 8–23)
CHLORIDE: 98 mmol/L (ref 98–111)
CO2: 24 mmol/L (ref 22–32)
Calcium: 9.3 mg/dL (ref 8.9–10.3)
Creatinine, Ser: 1.5 mg/dL — ABNORMAL HIGH (ref 0.61–1.24)
GFR calc non Af Amer: 43 mL/min — ABNORMAL LOW (ref >60)
GFR, EST AFRICAN AMERICAN: 50 mL/min — AB (ref >60)
GLUCOSE: 175 mg/dL — AB (ref 70–99)
Potassium: 3.6 mmol/L (ref 3.5–5.1)
Sodium: 134 mmol/L — ABNORMAL LOW (ref 135–145)

## 2017-08-22 LAB — TROPONIN I: TROPONIN I: 0.06 ng/mL — AB (ref <0.03)

## 2017-08-22 LAB — ECHOCARDIOGRAM COMPLETE
Height: 70 in
WEIGHTICAEL: 2801.6 [oz_av]

## 2017-08-22 LAB — HEPARIN LEVEL (UNFRACTIONATED)
Heparin Unfractionated: 0.45 IU/mL (ref 0.30–0.70)
Heparin Unfractionated: 0.45 IU/mL (ref 0.30–0.70)

## 2017-08-22 MED ORDER — THIAMINE HCL 100 MG/ML IJ SOLN: 100.0000 mg | Freq: Every day | INTRAMUSCULAR | Status: DC

## 2017-08-22 MED ORDER — LORAZEPAM 2 MG/ML IJ SOLN: 0.0000 mg | Freq: Four times a day (QID) | INTRAMUSCULAR | Status: DC

## 2017-08-22 MED ORDER — VITAMIN B-1 100 MG PO TABS
100.0000 mg | ORAL_TABLET | Freq: Every day | ORAL | Status: DC
  Administered 2017-08-22 – 2017-08-23 (×2): 100 mg via ORAL
  Filled 2017-08-22 (×2): qty 1

## 2017-08-22 MED ORDER — PERFLUTREN LIPID MICROSPHERE
1.0000 mL | INTRAVENOUS | Status: AC | PRN
Start: 2017-08-22 — End: 2017-08-22
  Administered 2017-08-22: 2 mL via INTRAVENOUS
  Filled 2017-08-22: qty 10

## 2017-08-22 MED ORDER — LORAZEPAM 2 MG/ML IJ SOLN: 1.0000 mg | Freq: Four times a day (QID) | INTRAMUSCULAR | Status: DC | PRN

## 2017-08-22 MED ORDER — HEPARIN (PORCINE) IN NACL 100-0.45 UNIT/ML-% IJ SOLN
1000.0000 [IU]/h | INTRAMUSCULAR | Status: DC
Start: 2017-08-22 — End: 2017-08-23
  Administered 2017-08-22 (×2): 1150 [IU]/h via INTRAVENOUS
  Filled 2017-08-22 (×2): qty 250

## 2017-08-22 MED ORDER — ADULT MULTIVITAMIN W/MINERALS CH
1.0000 | ORAL_TABLET | Freq: Every day | ORAL | Status: DC
  Administered 2017-08-22 – 2017-08-23 (×2): 1 via ORAL
  Filled 2017-08-22 (×2): qty 1

## 2017-08-22 MED ORDER — LORAZEPAM 2 MG/ML IJ SOLN: 0.0000 mg | Freq: Two times a day (BID) | INTRAMUSCULAR | Status: DC

## 2017-08-22 MED ORDER — FOLIC ACID 1 MG PO TABS
1.0000 mg | ORAL_TABLET | Freq: Every day | ORAL | Status: DC
  Administered 2017-08-22 – 2017-08-23 (×2): 1 mg via ORAL
  Filled 2017-08-22 (×3): qty 1

## 2017-08-22 MED ORDER — LORAZEPAM 1 MG PO TABS: 1.0000 mg | ORAL_TABLET | Freq: Four times a day (QID) | ORAL | Status: DC | PRN

## 2017-08-22 NOTE — Progress Notes (Signed)
  Echocardiogram 2D Echocardiogram has been performed.  Leta JunglingCooper, Hakan Nudelman M 08/22/2017, 11:08 AM

## 2017-08-22 NOTE — Progress Notes (Signed)
ANTICOAGULATION CONSULT NOTE - Follow-Up Consult  Pharmacy Consult for Heparin Indication: NSTEMI  Allergies  Allergen Reactions  . Codeine Other (See Comments)    unknown  . Lisinopril Cough  . Contrast Media [Iodinated Diagnostic Agents] Hives and Rash    Patient Measurements: Height: 5\' 10"  (177.8 cm) Weight: 175 lb 1.6 oz (79.4 kg) IBW/kg (Calculated) : 73 Heparin Dosing Weight: 80 kg  Vital Signs: Temp: 98.2 F (36.8 C) (08/10 1149) Temp Source: Oral (08/10 1149) BP: 132/75 (08/10 1149) Pulse Rate: 70 (08/10 1149)  Labs: Recent Labs    08/21/17 1539 08/21/17 1556 08/21/17 2106 08/22/17 0312 08/22/17 1036  HGB 12.1*  --   --  11.6*  --   HCT 35.7*  --   --  33.9*  --   PLT 216  --   --  237  --   HEPARINUNFRC  --   --   --   --  0.45  CREATININE  --   --   --  1.50*  --   TROPONINI  --  0.12* 0.07* 0.06*  --    [facility] on 8/8, then 1.20 on 08/21/17. PT 9.8 seconds, INR 1.0, aPTT 25.7 [facility].  Medical History: Past Medical History:  Diagnosis Date  . Anxiety   . Arthritis   . BPH (benign prostatic hyperplasia)   . CAD (coronary artery disease)   . Cataracts, bilateral   . Enlarged prostate   . GERD (gastroesophageal reflux disease)   . Glaucoma   . Hyperlipidemia   . Hypertension   . Non-ST elevation (NSTEMI) myocardial infarction (HCC) 08/21/2017  . Pneumonia   . PUD (peptic ulcer disease)   . Rotator cuff tear    Assessment: 77 yr old M on IV heparin for NSTEMI.  Pt s/p cath with plans for med mgmt.  Heparin level is therapeutic on 1150 units/hr.  Will repeat level this PM for confirmation.   Goal of Therapy:  Heparin level 0.3-0.7 units/ml Monitor platelets by anticoagulation protocol: Yes   Plan:  Continue Heparin drip at 1150 units/hr  Repeat heparin level at 6pm for confirmation.  Daily heparin level and CBC while on heparin.   Toys 'R' UsKimberly Shivaun Bilello, Pharm.D., BCPS Clinical  Pharmacist Clinical phone for 08/22/2017 from 8:30-4:00 is [phone].  **Pharmacist phone directory can now be found on [email or web address] ([email or web address]).  Listed under [facility].  08/22/2017 1:20 PM

## 2017-08-22 NOTE — Progress Notes (Signed)
[facility]                                                                              Patient Demographics  Austin Malone, is a 77 y.o. male, DOB - 1940-02-20, WUJ:[id number]  Admit date - 08/21/2017   Admitting Physician Clydie Braun, MD  Outpatient Primary MD for the patient is Shelbie Ammons, MD  Outpatient specialists:   LOS - 1  days    No chief complaint on file.      Brief summary  Austin Malone is a 77 y.o. male with medical history significant for but not limited to a CABG x 1 in 2008 w/ LIMA-LAD, SVG-?, SVG-? SVG-PDA,, 07/2014 NSTEMI at [facility] with DES SVG-PDA, presenting with  Chest pain improved with nitroglycerin and morphine, and transferred to con hospital for further cardiac evaluation    Assessment & Plan    Principal Problem:   Non-ST elevation (NSTEMI) myocardial infarction [facility]) Active Problems:   BPH (benign prostatic hyperplasia)   Hypertension   Alcohol abuse   Anxiety   non ST elevation MI:  chest pain free this morning.   Trend cardiac enzymes  cardiac catheterization completed this morning    acute CHF exacerbation:   Patient has had 10 lb weight gain in the last 2 months with lower extremity swelling and pulmonary edema on chest x-ray   Continue diuretics monitor electrolytes and renal function  daily weights, strict input and output management  check 2D echocardiogram   hyperlipidemia:   Continue statin  hypertension:   Continue beta-blocker   alcohol abuse:   Continue CIWA protocol   Code Status:  DNR DVT Prophylaxis:   SCD's Family Communication: Discussed in detail with the patient, all imaging results, lab results explained to the patient    Disposition Plan: home  Time Spent in minutes  31 minutes  Procedures:   cardiac catheterization 08/22/2017  Consultants:    cardiology Dr. Elease Hashimoto  Antimicrobials:      Medications  Scheduled Meds: . alfuzosin  10 mg Oral Daily  . aspirin  81 mg  Oral Daily  . bethanechol  50 mg Oral TID  . carvedilol  6.25 mg Oral BID  . clopidogrel  75 mg Oral Q breakfast  . dorzolamide-timolol  1 drop Both Eyes BID  . finasteride  5 mg Oral Daily  . folic acid  1 mg Oral Daily  . isosorbide mononitrate  30 mg Oral Daily  . latanoprost  1 drop Both Eyes QHS  . LORazepam  0-4 mg Intravenous Q6H   Followed by  . [START ON 08/24/2017] LORazepam  0-4 mg Intravenous Q12H  . Melatonin  18 mg Oral QHS  . multivitamin with minerals  1 tablet Oral Daily  . pantoprazole  40 mg Oral Daily  . ranolazine  1,000 mg Oral BID  . sodium chloride flush  3 mL Intravenous Q12H  . sodium chloride flush  3 mL Intravenous Q12H  . thiamine  100 mg Oral Daily   Or  . thiamine  100 mg Intravenous Daily   Continuous Infusions: . sodium chloride    . heparin  1,150 Units/hr (08/22/17 0305)   PRN Meds:.sodium chloride, acetaminophen **OR** acetaminophen, albuterol, LORazepam **OR** LORazepam, nitroGLYCERIN, ondansetron **OR** ondansetron (ZOFRAN) IV, sodium chloride flush   Antibiotics   Anti-infectives (From admission, onward)   None        Subjective:   Austin Malone was seen and examined today.  Patient denies dizziness, chest pain, shortness of breath,  No acute events overnight.    Objective:   Vitals:   08/22/17 0145 08/22/17 0215 08/22/17 0245 08/22/17 0607  BP: (!) 92/45 (!) 111/49 (!) 137/59 (!) 162/87  Pulse:    78  Resp:    20  Temp:    98.4 F (36.9 C)  TempSrc:    Oral  SpO2:    98%  Weight:    79.4 kg  Height:        Intake/Output Summary (Last 24 hours) at 08/22/2017 [id number] Last data filed at 08/22/2017 [id number] Gross per 24 hour  Intake 283.56 ml  Output 975 ml  Net -691.44 ml     Wt Readings from Last 3 Encounters:  08/22/17 79.4 kg  08/18/17 88 kg     Exam  General: NAD  HEENT: NCAT,  PERRL,MMM  Neck: SUPPLE, (-) JVD  Cardiovascular: RRR, (-) GALLOP, (-) MURMUR  Respiratory:  Bibasilar crackles    Gastrointestinal: SOFT, (-) DISTENSION, BS(+), (_) TENDERNESS  Ext: (-) CYANOSIS, (-) EDEMA  Neuro: A, OX 3  Skin:(-) RASH  Psych:NORMAL AFFECT/MOOD   Data Reviewed:  I have personally reviewed following labs and imaging studies  Micro Results No results found for this or any previous visit (from the past 240 hour(s)).  Radiology Reports No results found.  Lab Data:  CBC: Recent Labs  Lab 08/21/17 1539 08/22/17 0312  WBC 6.3 6.7  HGB 12.1* 11.6*  HCT 35.7* 33.9*  MCV 98.6 98.8  PLT 216 237   Basic Metabolic Panel: Recent Labs  Lab 08/22/17 0312  NA 134*  K 3.6  CL 98  CO2 24  GLUCOSE 175*  BUN 16  CREATININE 1.50*  CALCIUM 9.3   GFR: Estimated Creatinine Clearance: 41.9 mL/min (A) (by C-G formula based on SCr of 1.5 mg/dL (H)). Liver Function Tests: No results for input(s): AST, ALT, ALKPHOS, BILITOT, PROT, ALBUMIN in the last 168 hours. No results for input(s): LIPASE, AMYLASE in the last 168 hours. No results for input(s): AMMONIA in the last 168 hours. Coagulation Profile: No results for input(s): INR, PROTIME in the last 168 hours. Cardiac Enzymes: Recent Labs  Lab 08/21/17 1556 08/21/17 2106 08/22/17 0312  TROPONINI 0.12* 0.07* 0.06*   BNP (last 3 results) No results for input(s): PROBNP in the last 8760 hours. HbA1C: No results for input(s): HGBA1C in the last 72 hours. CBG: No results for input(s): GLUCAP in the last 168 hours. Lipid Profile: No results for input(s): CHOL, HDL, LDLCALC, TRIG, CHOLHDL, LDLDIRECT in the last 72 hours. Thyroid Function Tests: No results for input(s): TSH, T4TOTAL, FREET4, T3FREE, THYROIDAB in the last 72 hours. Anemia Panel: No results for input(s): VITAMINB12, FOLATE, FERRITIN, TIBC, IRON, RETICCTPCT in the last 72 hours. Urine analysis: No results found for: COLORURINE, APPEARANCEUR, LABSPEC, PHURINE, GLUCOSEU, HGBUR, BILIRUBINUR, KETONESUR, PROTEINUR, UROBILINOGEN, NITRITE, Murrell ConverseLEUKOCYTESUR   Austin Malone  Osei-Bonsu M.D. [facility] 08/22/2017, 9:23 AM  Pager: [phone] Between 7am to 7pm - call Pager - [phone]  After 7pm go to [email or web address] - password [email or web address]  Call night coverage person covering after 7pm

## 2017-08-22 NOTE — Progress Notes (Signed)
ANTICOAGULATION CONSULT NOTE - Follow-Up Consult  Pharmacy Consult for Heparin Indication: NSTEMI  Allergies  Allergen Reactions  . Codeine Other (See Comments)    unknown  . Lisinopril Cough  . Contrast Media [Iodinated Diagnostic Agents] Hives and Rash    Patient Measurements: Height: 5\' 10"  (177.8 cm) Weight: 175 lb 1.6 oz (79.4 kg) IBW/kg (Calculated) : 73 Heparin Dosing Weight: 80 kg  Vital Signs: Temp: 97.8 F (36.6 C) (08/10 1820) Temp Source: Oral (08/10 1820) BP: 127/64 (08/10 1820) Pulse Rate: 68 (08/10 1820)  Labs: Recent Labs    08/21/17 1539 08/21/17 1556 08/21/17 2106 08/22/17 0312 08/22/17 1036 08/22/17 1750  HGB 12.1*  --   --  11.6*  --   --   HCT 35.7*  --   --  33.9*  --   --   PLT 216  --   --  237  --   --   HEPARINUNFRC  --   --   --   --  0.45 0.45  CREATININE  --   --   --  1.50*  --   --   TROPONINI  --  0.12* 0.07* 0.06*  --   --    [facility] on 8/[facility], then 1.20 on 08/21/17. PT 9.8 seconds, INR 1.0, aPTT 25.7 [facility].  Assessment: 77 yr old M on IV heparin for NSTEMI.  Pt s/p cath with plans for med mgmt.  Heparin level remains therapeutic on 1150 units/hr. No bleeding noted  Goal of Therapy:  Heparin level 0.3-0.7 units/ml Monitor platelets by anticoagulation protocol: Yes   Plan:  Continue Heparin drip at 1150 units/hr   Daily heparin level and CBC while on heparin.  Lysle Pearlachel Hildegard Hlavac, PharmD, BCPS Please see AMION for all pharmacy numbers 08/22/2017 7:06 PM

## 2017-08-22 NOTE — Progress Notes (Signed)
Progress Note  Patient Name: Austin Malone Date of Encounter: 08/22/2017  Primary Cardiologist: Dr Tomie Chinaevankar  Subjective   No chest pain or dyspnea  Inpatient Medications    Scheduled Meds: . alfuzosin  10 mg Oral Daily  . aspirin  81 mg Oral Daily  . bethanechol  50 mg Oral TID  . carvedilol  6.25 mg Oral BID  . clopidogrel  75 mg Oral Q breakfast  . dorzolamide-timolol  1 drop Both Eyes BID  . finasteride  5 mg Oral Daily  . folic acid  1 mg Oral Daily  . isosorbide mononitrate  30 mg Oral Daily  . latanoprost  1 drop Both Eyes QHS  . LORazepam  0-4 mg Intravenous Q6H   Followed by  . [START ON 08/24/2017] LORazepam  0-4 mg Intravenous Q12H  . Melatonin  18 mg Oral QHS  . multivitamin with minerals  1 tablet Oral Daily  . pantoprazole  40 mg Oral Daily  . ranolazine  1,000 mg Oral BID  . sodium chloride flush  3 mL Intravenous Q12H  . sodium chloride flush  3 mL Intravenous Q12H  . thiamine  100 mg Oral Daily   Or  . thiamine  100 mg Intravenous Daily   Continuous Infusions: . sodium chloride    . heparin 1,150 Units/hr (08/22/17 0305)   PRN Meds: sodium chloride, acetaminophen **OR** acetaminophen, albuterol, LORazepam **OR** LORazepam, nitroGLYCERIN, ondansetron **OR** ondansetron (ZOFRAN) IV, sodium chloride flush   Vital Signs    Vitals:   08/22/17 0145 08/22/17 0215 08/22/17 0245 08/22/17 0607  BP: (!) 92/45 (!) 111/49 (!) 137/59 (!) 162/87  Pulse:    78  Resp:    20  Temp:    98.4 F (36.9 C)  TempSrc:    Oral  SpO2:    98%  Weight:    79.4 kg  Height:        Intake/Output Summary (Last 24 hours) at 08/22/2017 1034 Last data filed at 08/22/2017 [id number] Gross per 24 hour  Intake 283.56 ml  Output 975 ml  Net -691.44 ml   Filed Weights   08/21/17 1531 08/21/17 1537 08/22/17 0607  Weight: 80.4 kg 80.4 kg 79.4 kg    Telemetry    Sinus - Personally Reviewed  Physical Exam   GEN: No acute distress.   Neck: No JVD Cardiac: RRR, no murmurs,  rubs, or gallops.  Respiratory: Clear to auscultation bilaterally. GI: Soft, nontender, non-distended  MS: No edema; cath site with no hematoma Neuro:  Nonfocal  Psych: Normal affect   Labs    Chemistry Recent Labs  Lab 08/22/17 0312  NA 134*  K 3.6  CL 98  CO2 24  GLUCOSE 175*  BUN 16  CREATININE 1.50*  CALCIUM 9.3  GFRNONAA 43*  GFRAA 50*  ANIONGAP 12     Hematology Recent Labs  Lab 08/21/17 1539 08/22/17 0312  WBC 6.3 6.7  RBC 3.62* 3.43*  HGB 12.1* 11.6*  HCT 35.7* 33.9*  MCV 98.6 98.8  MCH 33.4 33.8  MCHC 33.9 34.2  RDW 12.8 12.5  PLT 216 237    Cardiac Enzymes Recent Labs  Lab 08/21/17 1556 08/21/17 2106 08/22/17 0312  TROPONINI 0.12* 0.07* 0.06*    Patient Profile     77 y.o. male with past medical history of coronary artery disease status post coronary artery bypass and graft, hypertension, hyperlipidemia, SIADH, recent admission for urinary retention admitted with unstable angina.  Cardiac catheterization performed yesterday showed severe native  coronary disease, patent LIMA to the LAD, patent saphenous vein graft to the PDA, chronically occluded saphenous vein graft to the diagonal and chronically occluded saphenous vein graft to the marginal.  LV function preserved.  Progressive disease in right coronary artery felt possibly the cause of his angina and medical therapy recommended.  Complex PCI could be considered with atherectomy if symptoms difficult to control.  Assessment & Plan    1 coronary artery disease-patient admitted with unstable angina.  Catheterization results noted.  Plan is medical therapy and pursuing PCI of right coronary artery if unable to control symptoms.  Continue aspirin, Plavix, carvedilol, nitrates, heparin and add statin.  I will continue heparin today and discontinue tomorrow morning.  If he is pain-free we will likely discharge with close outpatient follow-up.  2 hyperlipidemia-we will add Crestor 40 mg daily.  Check  lipids and liver in 4 weeks.  3 hypertension-blood pressure mildly elevated this morning but decreased last evening.  Continue present medications and follow today.  4 chronic stage III kidney disease-recheck renal function tomorrow morning.   For questions or updates, please contact [facility] Please consult [email or web address] for contact info under Cardiology/STEMI.      Signed, Olga Millers, MD  08/22/2017, 10:34 AM

## 2017-08-23 LAB — CBC
HEMATOCRIT: 29.5 % — AB (ref 39.0–52.0)
HEMOGLOBIN: 10.1 g/dL — AB (ref 13.0–17.0)
MCH: 33.6 pg (ref 26.0–34.0)
MCHC: 34.2 g/dL (ref 30.0–36.0)
MCV: 98 fL (ref 78.0–100.0)
Platelets: 210 10*3/uL (ref 150–400)
RBC: 3.01 MIL/uL — ABNORMAL LOW (ref 4.22–5.81)
RDW: 12.5 % (ref 11.5–15.5)
WBC: 11.7 10*3/uL — ABNORMAL HIGH (ref 4.0–10.5)

## 2017-08-23 LAB — HEPARIN LEVEL (UNFRACTIONATED): HEPARIN UNFRACTIONATED: 0.87 [IU]/mL — AB (ref 0.30–0.70)

## 2017-08-23 LAB — BASIC METABOLIC PANEL
Anion gap: 9 (ref 5–15)
BUN: 24 mg/dL — ABNORMAL HIGH (ref 8–23)
CHLORIDE: 98 mmol/L (ref 98–111)
CO2: 26 mmol/L (ref 22–32)
Calcium: 8.9 mg/dL (ref 8.9–10.3)
Creatinine, Ser: 1.28 mg/dL — ABNORMAL HIGH (ref 0.61–1.24)
GFR calc non Af Amer: 52 mL/min — ABNORMAL LOW (ref >60)
Glucose, Bld: 125 mg/dL — ABNORMAL HIGH (ref 70–99)
Potassium: 3.9 mmol/L (ref 3.5–5.1)
Sodium: 133 mmol/L — ABNORMAL LOW (ref 135–145)

## 2017-08-23 MED ORDER — ALPRAZOLAM 0.5 MG PO TABS
0.5000 mg | ORAL_TABLET | Freq: Every evening | ORAL | Status: DC | PRN
Start: 2017-08-23 — End: 2017-08-23
  Administered 2017-08-23: 0.5 mg via ORAL
  Filled 2017-08-23: qty 1

## 2017-08-23 MED ORDER — ISOSORBIDE MONONITRATE ER 30 MG PO TB24: 30.0000 mg | ORAL_TABLET | Freq: Every day | ORAL | 0 refills | Status: DC

## 2017-08-23 MED ORDER — ADULT MULTIVITAMIN W/MINERALS CH: 1.0000 | ORAL_TABLET | Freq: Every day | ORAL | 0 refills | Status: DC

## 2017-08-23 MED ORDER — ROSUVASTATIN CALCIUM 20 MG PO TABS: 40.0000 mg | ORAL_TABLET | Freq: Every day | ORAL | Status: DC

## 2017-08-23 MED ORDER — CLOPIDOGREL BISULFATE 75 MG PO TABS: 75.0000 mg | ORAL_TABLET | Freq: Every day | ORAL | 1 refills | Status: DC

## 2017-08-23 MED ORDER — ROSUVASTATIN CALCIUM 40 MG PO TABS: 40.0000 mg | ORAL_TABLET | Freq: Every day | ORAL | 1 refills | Status: DC

## 2017-08-23 MED ORDER — THIAMINE HCL 100 MG PO TABS: 100.0000 mg | ORAL_TABLET | Freq: Every day | ORAL | 0 refills | Status: DC

## 2017-08-23 MED ORDER — FOLIC ACID 1 MG PO TABS: 1.0000 mg | ORAL_TABLET | Freq: Every day | ORAL | 0 refills | Status: DC

## 2017-08-23 NOTE — Discharge Summary (Signed)
Austin BibleJames Trieu, is a 77 y.o. male  DOB 14-Jul-1940  MRN [id number].  Admission date:  08/21/2017  Admitting Physician  Clydie Braunondell A Smith, MD  Discharge Date:  08/23/2017   Primary MD  Shelbie AmmonsHaque, Imran P, MD  Recommendations for primary care physician for things to follow:  CBC, CMP Follow-up LFT and lipid in 1 month  Admission Diagnosis  ACUTE CORONARY SYNDROME   Discharge Diagnosis  ACUTE CORONARY SYNDROME    Principal Problem:   Non-ST elevation (NSTEMI) myocardial infarction [facility](HCC) Active Problems:   BPH (benign prostatic hyperplasia)   Hypertension   Alcohol abuse   Anxiety      Past Medical History:  Diagnosis Date  . Anxiety   . Arthritis   . BPH (benign prostatic hyperplasia)   . CAD (coronary artery disease)   . Cataracts, bilateral   . Enlarged prostate   . GERD (gastroesophageal reflux disease)   . Glaucoma   . Hyperlipidemia   . Hypertension   . Non-ST elevation (NSTEMI) myocardial infarction (HCC) 08/21/2017  . Pneumonia   . PUD (peptic ulcer disease)   . Rotator cuff tear     Past Surgical History:  Procedure Laterality Date  . APPENDECTOMY    . CORONARY ANGIOPLASTY WITH STENT PLACEMENT  11/2014   PCI of SVG to PDA x 2 with Xience DES to proximal stenosis, Ultra 5.0 BMS to distal thrombotic lesion  . CORONARY ARTERY BYPASS GRAFT  2008  . EYE SURGERY    . LEG SURGERY    . TONSILLECTOMY         HPI  from the history and physical done on the day of admission:  Austin Malone is a [age] y.o. male with medical history significant of HTN, HLD, CAD s/p CABG in 2008 ,s/p stent 2016, CHF, and BPH; who [facility] to [facility] with complaints of chest pain on 8/8.  Patient complained of chest pressure symptoms that woke him up the night before out of his sleep.  Pain was felt across his chest and possibly down both of his arms. He tried taking nitroglycerin several times without  relief of pain.  He called EMS and took 4 baby aspirin as instructed.  Associated symptoms include lower extremity swelling and weight gain of 10 pounds over the last 2months.  Denies any significant orthopnea, fever, chills,diaphoresis,  or cough symptoms.  Had just been seen by his cardiologist Dr. Tomie Chinaevankar on 8/6.   In the [facility] the patient's vital signs were stable and his lab work was mostly unremarkable. EKG did not show any acute ST-T changes. Given his high risk he was admitted for cardiac workup. During his hospitalization patient remained chest pain free but his troponin trended up to 0.26, and then trended down to 0.22. EKG remained unremarkable. No acute events on telemetry. After patient was evaluated by Cardiology they recommended patient would benefit from left heart catheterization versus stress test. Case was discussed by Dr Joylene JohnWarrikh from Cardiology here to [facility] who agreed  to see the patient in consultation for likely left heart catheterization.     Hospital Course:  Khaled Hutchisonis a 77 y.o.malewith medical history significant for but not limited to a CABG x 1 in 2008 w/ LIMA-LAD, SVG-?, SVG-? SVG-PDA,, 07/2014 NSTEMI at [facility] with DES SVG-PDA, presenting with  Chest pain improved with nitroglycerin and morphine, and transferred to con hospital for further cardiac evaluation    on ST elevation MI:  Patient admitted with unstable angina.   Catheterization results 08/21/2017- severe native coronary disease, patent LIMA to the LAD, patent saphenous vein graft to the PDA, chronically occluded saphenous vein graft to the diagonal and chronically occluded saphenous vein graft to the marginal.  LV function preserved.  Progressive disease in right coronary artery felt possibly the cause of his angina and medical therapy recommended.    Plan is medical therapy and pursuing PCI of right coronary artery if unable to control symptoms.  Continue  aspirin, Plavix, carvedilol, nitrates and statin. Discontinued heparin, and was able to ambulate without chest pain patient  He is to follow-up in [place] in 2 weeks with Dr. Tomie China.    acute CHF exacerbation:   Patient has had 10 lb weight gain in the last 2 months with lower extremity swelling and pulmonary edema on chest x-ray   Continue diuretics monitor electrolytes and renal function  daily weights, strict in/put and output management/  2D echocardiogram - 08/22/2017- EF 55-60%, Grade 1 DDysfxn   hyperlipidemia:   Continue statin  hypertension:   Continue beta-blocker   alcohol abuse:   Continue CIWA protocol     Discharge Condition: stable  Follow UP - Dr Henrietta Hoover     Consults obtained -cardiology, Dr Jens Som  Diet and Activity recommendation:  As advised  Discharge Instructions     Discharge Instructions    Call MD for:  difficulty breathing, headache or visual disturbances   Complete by:  As directed    Call MD for:  extreme fatigue   Complete by:  As directed    Call MD for:  hives   Complete by:  As directed    Call MD for:  persistant dizziness or light-headedness   Complete by:  As directed    Call MD for:  persistant nausea and vomiting   Complete by:  As directed    Call MD for:  redness, tenderness, or signs of infection (pain, swelling, redness, odor or green/yellow discharge around incision site)   Complete by:  As directed    Call MD for:  severe uncontrolled pain   Complete by:  As directed    Call MD for:  temperature >100.4   Complete by:  As directed    Diet - low sodium heart healthy   Complete by:  As directed    Increase activity slowly   Complete by:  As directed         Discharge Medications     Allergies as of 08/23/2017      Reactions   Codeine Other (See Comments)   unknown   Lisinopril Cough   Contrast Media [iodinated Diagnostic Agents] Hives, Rash      Medication List    TAKE these medications     alfuzosin 10 MG 24 hr tablet Commonly known as:  UROXATRAL Take 1 tablet by mouth daily.   ALPRAZolam 0.5 MG tablet Commonly known as:  XANAX Take 0.5 mg by mouth 4 (four) times daily as needed for anxiety or sleep.   aspirin EC  81 MG tablet Take 81 mg by mouth daily.   bethanechol 50 MG tablet Commonly known as:  URECHOLINE Take 50 mg by mouth 3 (three) times daily.   carvedilol 6.25 MG tablet Commonly known as:  COREG Take 1 tablet by mouth 2 (two) times daily.   clopidogrel 75 MG tablet Commonly known as:  PLAVIX Take 1 tablet (75 mg total) by mouth daily with breakfast. Start taking on:  08/24/2017   Coenzyme Q10 100 MG capsule Take 1 capsule by mouth daily.   dorzolamide-timolol 22.3-6.8 MG/ML ophthalmic solution Commonly known as:  COSOPT Place 1 drop into both eyes 2 (two) times daily.   finasteride 5 MG tablet Commonly known as:  PROSCAR Take 5 mg by mouth daily.   folic acid 1 MG tablet Commonly known as:  FOLVITE Take 1 tablet (1 mg total) by mouth daily. Start taking on:  08/24/2017   furosemide 20 MG tablet Commonly known as:  LASIX Take 20 mg by mouth daily as needed for fluid.   isosorbide mononitrate 30 MG 24 hr tablet Commonly known as:  IMDUR Take 1 tablet (30 mg total) by mouth daily. Start taking on:  08/24/2017   MELATONIN PO Take 20 mg by mouth at bedtime.   multivitamin with minerals Tabs tablet Take 1 tablet by mouth daily. Start taking on:  08/24/2017   nitroGLYCERIN 0.4 MG SL tablet Commonly known as:  NITROSTAT Place 0.4 mg under the tongue every 5 (five) minutes as needed for chest pain.   ondansetron 8 MG tablet Commonly known as:  ZOFRAN Take 8 mg by mouth every 8 (eight) hours as needed for nausea or vomiting.   pantoprazole 40 MG tablet Commonly known as:  PROTONIX Take 40 mg by mouth daily.   RANEXA 1000 MG SR tablet Generic drug:  ranolazine Take 1,000 mg by mouth 2 (two) times daily.   rosuvastatin 40 MG  tablet Commonly known as:  CRESTOR Take 1 tablet (40 mg total) by mouth daily at 6 PM.   thiamine 100 MG tablet Take 1 tablet (100 mg total) by mouth daily. Start taking on:  08/24/2017   TRAVATAN Z 0.004 % Soln ophthalmic solution Generic drug:  Travoprost (BAK Free) Place 1 drop into both eyes at bedtime.   vitamin C 500 MG tablet Commonly known as:  ASCORBIC ACID Take 1,000 mg by mouth 2 (two) times daily.   VITAMIN D-1000 MAX ST 1000 units tablet Generic drug:  Cholecalciferol Take 1 capsule by mouth daily.       Major procedures and Radiology Reports - PLEASE review detailed and final reports for all details, in brief -   ------------------------------------------------------------------- Study Conclusions  - Left ventricle: The cavity size was mildly dilated. Wall   thickness was normal. Systolic function was normal. The estimated   ejection fraction was in the range of 55% to 60%. Wall motion was   normal; there were no regional wall motion abnormalities. Doppler   parameters are consistent with abnormal left ventricular   relaxation (grade 1 diastolic dysfunction). - Aortic valve: There was mild regurgitation. - Mitral valve: There was mild regurgitation. - Left atrium: The atrium was mildly dilated.  Impressions:  - Definity used; normal LV systolic function; mild diastolic   dysfunction; mild LVE; mild AI and MR; mild LAE.   No results found.  Micro Results    No results found for this or any previous visit (from the past 240 hour(s)).     Today  Subjective    Austin Bible today has no chest pain         Patient has been seen and examined prior to discharge   Objective   Blood pressure (!) 143/80, pulse 60, temperature 97.7 F (36.5 C), temperature source Oral, resp. rate 17, height 5\' 10"  (1.778 m), weight 81.7 kg, SpO2 100 %.   Intake/Output Summary (Last 24 hours) at 08/23/2017 1355 Last data filed at 08/23/2017 0900 Gross per 24  hour  Intake 598 ml  Output 1325 ml  Net -727 ml    Exam Gen:- Awake   HEENT:- [place].AT,   Neck-Supple Neck,No JVD,  Lungs- mostly clear  CV- S1, S2 normal Abd-  +ve B.Sounds, Abd Soft, No tenderness,    Extremity/Skin:- Intact peripheral pulses     Data Review   CBC w Diff:  Lab Results  Component Value Date   WBC 11.7 (H) 08/23/2017   HGB 10.1 (L) 08/23/2017   HCT 29.5 (L) 08/23/2017   PLT 210 08/23/2017    CMP:  Lab Results  Component Value Date   NA 133 (L) 08/23/2017   K 3.9 08/23/2017   CL 98 08/23/2017   CO2 26 08/23/2017   BUN 24 (H) 08/23/2017   CREATININE 1.28 (H) 08/23/2017  .   Total Discharge time is about 33 minutes  Jackie Plum M.D on 08/23/2017 at 1:55 PM  [facility]   Office  [phone]  Dragon dictation system was used to create this note, attempts have been made to correct errors, however presence of uncorrected errors is not a reflection quality of care provided

## 2017-08-23 NOTE — Progress Notes (Signed)
ANTICOAGULATION CONSULT NOTE - Follow Up Consult  Pharmacy Consult for heparin Indication: NSTEMI  Labs: Recent Labs    08/21/17 1539 08/21/17 1556 08/21/17 2106 08/22/17 0312 08/22/17 1036 08/22/17 1750 08/23/17 0433  HGB 12.1*  --   --  11.6*  --   --  10.1*  HCT 35.7*  --   --  33.9*  --   --  29.5*  PLT 216  --   --  237  --   --  210  HEPARINUNFRC  --   --   --   --  0.45 0.45 0.87*  CREATININE  --   --   --  1.50*  --   --   --   TROPONINI  --  0.12* 0.07* 0.06*  --   --   --     Assessment: 77yo male now supratherapeutic on heparin after two levels at goal; Hgb down but RN reports no signs of bleeding.  Goal of Therapy:  Heparin level 0.3-0.7 units/ml   Plan:  Will decrease heparin gtt by 2 units/kg/hr to 1000 units/hr and check level in 8 hours.    Vernard GamblesVeronda Elene Downum, PharmD, BCPS  08/23/2017,5:29 AM

## 2017-08-23 NOTE — Progress Notes (Signed)
Progress Note  Patient Name: Austin Malone Date of Encounter: 08/23/2017  Primary Cardiologist: Dr Tomie Chinaevankar  Subjective   Pt denies CP or dyspnea  Inpatient Medications    Scheduled Meds: . alfuzosin  10 mg Oral Daily  . aspirin  81 mg Oral Daily  . bethanechol  50 mg Oral TID  . carvedilol  6.25 mg Oral BID  . clopidogrel  75 mg Oral Q breakfast  . dorzolamide-timolol  1 drop Both Eyes BID  . finasteride  5 mg Oral Daily  . folic acid  1 mg Oral Daily  . isosorbide mononitrate  30 mg Oral Daily  . latanoprost  1 drop Both Eyes QHS  . LORazepam  0-4 mg Intravenous Q6H   Followed by  . [START ON 08/24/2017] LORazepam  0-4 mg Intravenous Q12H  . Melatonin  18 mg Oral QHS  . multivitamin with minerals  1 tablet Oral Daily  . pantoprazole  40 mg Oral Daily  . ranolazine  1,000 mg Oral BID  . sodium chloride flush  3 mL Intravenous Q12H  . sodium chloride flush  3 mL Intravenous Q12H  . thiamine  100 mg Oral Daily   Or  . thiamine  100 mg Intravenous Daily   Continuous Infusions: . sodium chloride    . heparin 1,000 Units/hr (08/23/17 0541)   PRN Meds: sodium chloride, acetaminophen **OR** acetaminophen, albuterol, ALPRAZolam, LORazepam **OR** LORazepam, nitroGLYCERIN, ondansetron **OR** ondansetron (ZOFRAN) IV, sodium chloride flush   Vital Signs    Vitals:   08/22/17 1149 08/22/17 1820 08/23/17 0011 08/23/17 0553  BP: 132/75 127/64 134/65 (!) 143/80  Pulse: 70 68 72 60  Resp: 18 16 16 17   Temp: 98.2 F (36.8 C) 97.8 F (36.6 C) 98 F (36.7 C) 97.7 F (36.5 C)  TempSrc: Oral Oral Oral Oral  SpO2: 98% 96% 99% 100%  Weight:    81.7 kg  Height:        Intake/Output Summary (Last 24 hours) at 08/23/2017 1104 Last data filed at 08/23/2017 0900 Gross per 24 hour  Intake 958 ml  Output 1325 ml  Net -367 ml   Filed Weights   08/21/17 1537 08/22/17 0607 08/23/17 0553  Weight: 80.4 kg 79.4 kg 81.7 kg    Telemetry    Sinus with  PVC- Personally  Reviewed  Physical Exam   GEN: No acute distress.  WD/WN Neck: No JVD, supple Cardiac: RRR Respiratory: Clear to auscultation bilaterally; no wheeze. GI: Soft, NT/ND MS: No edema Neuro:  Grossly intact   Labs    Chemistry Recent Labs  Lab 08/22/17 0312 08/23/17 0433  NA 134* 133*  K 3.6 3.9  CL 98 98  CO2 24 26  GLUCOSE 175* 125*  BUN 16 24*  CREATININE 1.50* 1.28*  CALCIUM 9.3 8.9  GFRNONAA 43* 52*  GFRAA 50* >60  ANIONGAP 12 9     Hematology Recent Labs  Lab 08/21/17 1539 08/22/17 0312 08/23/17 0433  WBC 6.3 6.7 11.7*  RBC 3.62* 3.43* 3.01*  HGB 12.1* 11.6* 10.1*  HCT 35.7* 33.9* 29.5*  MCV 98.6 98.8 98.0  MCH 33.4 33.8 33.6  MCHC 33.9 34.2 34.2  RDW 12.8 12.5 12.5  PLT 216 237 210    Cardiac Enzymes Recent Labs  Lab 08/21/17 1556 08/21/17 2106 08/22/17 0312  TROPONINI 0.12* 0.07* 0.06*    Patient Profile     77 y.o. male with past medical history of coronary artery disease status post coronary artery bypass and graft,  hypertension, hyperlipidemia, SIADH, recent admission for urinary retention admitted with unstable angina.  Cardiac catheterization performed 8/9 showed severe native coronary disease, patent LIMA to the LAD, patent saphenous vein graft to the PDA, chronically occluded saphenous vein graft to the diagonal and chronically occluded saphenous vein graft to the marginal.  LV function preserved.  Progressive disease in right coronary artery felt possibly the cause of his angina and medical therapy recommended.  Complex PCI could be considered with atherectomy if symptoms difficult to control.  Assessment & Plan    1 coronary artery disease-patient admitted with unstable angina.  Catheterization results noted.  Plan is medical therapy and pursuing PCI of right coronary artery if unable to control symptoms.  Continue aspirin, Plavix, carvedilol, nitrates and statin.  Discontinue heparin.  Ambulate this morning.  If no chest pain patient can  be discharged this afternoon and follow-up in [place] in 2 weeks with Dr. Tomie China.  2 hyperlipidemia-continue Crestor 40 mg daily.  Check lipids and liver in 4 weeks.  3 hypertension-blood pressure controlled.  Continue present medications and follow.  4 chronic stage III kidney disease-creatinine improved this morning.  We will sign off. No plans for further cardiac testing. Continue present in-hospital medications at discharge with no changes from a cardiology standpoint. FU with Dr. Tomie China in 2 weeks as outlined.   For questions or updates, please contact [facility] Please consult [email or web address] for contact info under Cardiology/STEMI.      Signed, Olga Millers, MD  08/23/2017, 11:04 AM

## 2017-08-24 ENCOUNTER — Encounter (HOSPITAL_COMMUNITY): Payer: Self-pay | Admitting: Internal Medicine

## 2017-08-24 ENCOUNTER — Telehealth: Payer: Self-pay | Admitting: Cardiology

## 2017-08-24 NOTE — Telephone Encounter (Signed)
Attempted to call patient. Left message to return call.

## 2017-08-24 NOTE — Telephone Encounter (Signed)
toc appt scheduled with RRR 08/27

## 2017-08-25 NOTE — Telephone Encounter (Signed)
Spoke to patient about transition of care. Patient has no questions or concerns right now. Will see patient on scheduled appointment date.

## 2017-09-08 ENCOUNTER — Ambulatory Visit: Payer: Medicare HMO | Admitting: Cardiology

## 2017-09-08 ENCOUNTER — Encounter: Payer: Self-pay | Admitting: Cardiology

## 2017-09-08 VITALS — BP 116/62 | HR 57 | Ht 70.0 in | Wt 194.0 lb

## 2017-09-08 DIAGNOSIS — Z01818 Encounter for other preprocedural examination: Secondary | ICD-10-CM

## 2017-09-08 DIAGNOSIS — I10 Essential (primary) hypertension: Secondary | ICD-10-CM

## 2017-09-08 DIAGNOSIS — I2510 Atherosclerotic heart disease of native coronary artery without angina pectoris: Secondary | ICD-10-CM | POA: Diagnosis not present

## 2017-09-08 HISTORY — DX: Encounter for other preprocedural examination: Z01.818

## 2017-09-08 NOTE — Patient Instructions (Signed)
Medication Instructions:  Your physician recommends that you continue on your current medications as directed. Please refer to the Current Medication list given to you today.  Labwork: Your physician recommends that you have the following labs drawn: BMP, TSH, liver and lipid panel.  Testing/Procedures: None  Follow-Up: Your physician recommends that you schedule a follow-up appointment in: 6 months  Any Other Special Instructions Will Be Listed Below (If Applicable).     If you need a refill on your cardiac medications before your next appointment, please call your pharmacy.   [facility]  Ashley A, RN, BSN  

## 2017-09-08 NOTE — Progress Notes (Signed)
Cardiology Office Note:    Date:  09/08/2017   ID:  Austin Malone, DOB 01-Oct-1940, MRN [id number]  PCP:  Shelbie Ammons, MD  Cardiologist:  Garwin Brothers, MD   Referring MD: Shelbie Ammons, MD    ASSESSMENT:    1. Pre-operative clearance   2. Essential hypertension   3. Coronary artery disease involving native coronary artery, angina presence unspecified, unspecified whether native or transplanted heart    PLAN:    In order of problems listed above:  1. Secondary prevention stressed with patient.  Importance of compliance with diet and medications stressed and he vocalized understanding.  His blood pressure is stable. 2. I have asked him to come back in 2 months for blood work including lipids.  We will try to optimize therapy. 3. In view of his excellent effort tolerance, I think he is not at high risk for coronary events during his prostate surgery.  Meticulous hemodynamic monitoring and continued perioperative beta-blockade will further reduce the risk of coronary events.  I told the patient that he could go for the surgery in about 2 to 3 weeks.  I told him to continue walking 30 minutes a day.  His clopidogrel may be held for a time in the perioperative.  As felt okay by his surgeon and resumed as soon as possible.  I advised him to continue aspirin uninterrupted. 4. Patient will be seen in follow-up appointment in 6 months or earlier if the patient has any concerns    Medication Adjustments/Labs and Tests Ordered: Current medicines are reviewed at length with the patient today.  Concerns regarding medicines are outlined above.  Orders Placed This Encounter  Procedures  . Basic metabolic panel  . TSH  . Hepatic function panel  . Lipid panel   No orders of the defined types were placed in this encounter.    No chief complaint on file.    History of Present Illness:    Austin Malone is a 77 y.o. male.  The patient has known coronary artery disease and has  undergone coronary stenting in 2016.  He was evaluated again recently and underwent coronary angiography.  Medical therapy was recommended.  Subsequently is done fine.  No chest pain orthopnea or PND.  He mentions to me that he walks 30 minutes without any problems.  At the time of my evaluation, the patient is alert awake oriented and in no distress. Patient mentions to me that he plans to undergo prostate surgery because of repeated catheterization for urinary purposes is very difficult for him.  Past Medical History:  Diagnosis Date  . Anxiety   . Arthritis   . BPH (benign prostatic hyperplasia)   . CAD (coronary artery disease)   . Cataracts, bilateral   . Enlarged prostate   . GERD (gastroesophageal reflux disease)   . Glaucoma   . Hyperlipidemia   . Hypertension   . Non-ST elevation (NSTEMI) myocardial infarction (HCC) 08/21/2017  . Pneumonia   . PUD (peptic ulcer disease)   . Rotator cuff tear     Past Surgical History:  Procedure Laterality Date  . APPENDECTOMY    . CORONARY ANGIOPLASTY WITH STENT PLACEMENT  11/2014   PCI of SVG to PDA x 2 with Xience DES to proximal stenosis, Ultra 5.0 BMS to distal thrombotic lesion  . CORONARY ARTERY BYPASS GRAFT  2008  . EYE SURGERY    . LEFT HEART CATH AND CORS/GRAFTS ANGIOGRAPHY N/A 08/21/2017   Procedure: LEFT HEART CATH  AND CORS/GRAFTS ANGIOGRAPHY;  Surgeon: Yvonne Kendall, MD;  [facility];  Service: Cardiovascular;  Laterality: N/A;  . LEG SURGERY    . TONSILLECTOMY      Current Medications: Current Meds  Medication Sig  . alfuzosin (UROXATRAL) 10 MG 24 hr tablet Take 1 tablet by mouth daily.  Marland Kitchen ALPRAZolam (XANAX) 0.5 MG tablet Take 0.5 mg by mouth 4 (four) times daily as needed for anxiety or sleep.   Marland Kitchen aspirin EC 81 MG tablet Take 81 mg by mouth daily.  . bethanechol (URECHOLINE) 50 MG tablet Take 50 mg by mouth 3 (three) times daily.  . carvedilol (COREG) 6.25 MG tablet Take 1 tablet by mouth 2 (two) times  daily.  . Cholecalciferol (VITAMIN D-1000 MAX ST) 1000 units tablet Take 1 capsule by mouth daily.  . clopidogrel (PLAVIX) 75 MG tablet Take 1 tablet (75 mg total) by mouth daily with breakfast.  . Coenzyme Q10 100 MG capsule Take 1 capsule by mouth daily.  . dorzolamide-timolol (COSOPT) 22.3-6.8 MG/ML ophthalmic solution Place 1 drop into both eyes 2 (two) times daily.   . finasteride (PROSCAR) 5 MG tablet Take 5 mg by mouth daily.   . folic acid (FOLVITE) 1 MG tablet Take 1 tablet (1 mg total) by mouth daily.  . furosemide (LASIX) 20 MG tablet Take 20 mg by mouth daily as needed for fluid.   . isosorbide mononitrate (IMDUR) 30 MG 24 hr tablet Take 1 tablet (30 mg total) by mouth daily.  Marland Kitchen MELATONIN PO Take 20 mg by mouth at bedtime.   . Multiple Vitamin (MULTIVITAMIN WITH MINERALS) TABS tablet Take 1 tablet by mouth daily.  . nitroGLYCERIN (NITROSTAT) 0.4 MG SL tablet Place 0.4 mg under the tongue every 5 (five) minutes as needed for chest pain.   Marland Kitchen ondansetron (ZOFRAN) 8 MG tablet Take 8 mg by mouth every 8 (eight) hours as needed for nausea or vomiting.   . pantoprazole (PROTONIX) 40 MG tablet Take 40 mg by mouth daily.   . ranolazine (RANEXA) 1000 MG SR tablet Take 1,000 mg by mouth 2 (two) times daily.   . rosuvastatin (CRESTOR) 40 MG tablet Take 1 tablet (40 mg total) by mouth daily at 6 PM.  . thiamine 100 MG tablet Take 1 tablet (100 mg total) by mouth daily.  . TRAVATAN Z 0.004 % SOLN ophthalmic solution Place 1 drop into both eyes at bedtime.   . vitamin C (ASCORBIC ACID) 500 MG tablet Take 1,000 mg by mouth 2 (two) times daily.      Allergies:   Codeine; Lisinopril; and Contrast media [iodinated diagnostic agents]   Social History   Socioeconomic History  . Marital status: Married    Spouse name: Not on file  . Number of children: Not on file  . Years of education: Not on file  . Highest education level: Not on file  Occupational History  . Occupation: Retired  Sports coach  . Financial resource strain: Not on file  . Food insecurity:    Worry: Not on file    Inability: Not on file  . Transportation needs:    Medical: Not on file    Non-medical: Not on file  Tobacco Use  . Smoking status: Former Games developer  . Smokeless tobacco: Never Used  Substance and Sexual Activity  . Alcohol use: Yes    Alcohol/week: 21.0 standard drinks    Types: 21 Cans of beer per week  . Drug use: Yes  Types: Marijuana  . Sexual activity: Not on file  Lifestyle  . Physical activity:    Days per week: Not on file    Minutes per session: Not on file  . Stress: Not on file  Relationships  . Social connections:    Talks on phone: Not on file    Gets together: Not on file    Attends religious service: Not on file    Active member of club or organization: Not on file    Attends meetings of clubs or organizations: Not on file    Relationship status: Not on file  Other Topics Concern  . Not on file  Social History Narrative   [place].     Family History: The patient's family history includes Diabetes in his mother; Heart attack in his brother.  ROS:   Please see the history of present illness.    All other systems reviewed and are negative.  EKGs/Labs/Other Studies Reviewed:    The following studies were reviewed today: I discussed the findings with my patient at extensive length.   Recent Labs: 08/23/2017: BUN 24; Creatinine, Ser 1.28; Hemoglobin 10.1; Platelets 210; Potassium 3.9; Sodium 133  Recent Lipid Panel No results found for: CHOL, TRIG, HDL, CHOLHDL, VLDL, LDLCALC, LDLDIRECT  Physical Exam:    VS:  BP 116/62 (BP Location: Right Arm, Patient Position: Sitting, Cuff Size: Normal)   Pulse (!) 57   Ht 5\' 10"  (1.778 m)   Wt 194 lb (88 kg)   SpO2 98%   BMI 27.84 kg/m     Wt Readings from Last 3 Encounters:  09/08/17 194 lb (88 kg)  08/23/17 180 lb 3.2 oz (81.7 kg)  08/18/17 194 lb (88 kg)     GEN: Patient  is in no acute distress HEENT: Normal NECK: No JVD; No carotid bruits LYMPHATICS: No lymphadenopathy CARDIAC: Hear sounds regular, 2/6 systolic murmur at the apex. RESPIRATORY:  Clear to auscultation without rales, wheezing or rhonchi  ABDOMEN: Soft, non-tender, non-distended MUSCULOSKELETAL:  No edema; No deformity  SKIN: Warm and dry NEUROLOGIC:  Alert and oriented x 3 PSYCHIATRIC:  Normal affect   Signed, Garwin Brothersajan R Daleiza Bacchi, MD  09/08/2017 11:26 AM     [facility]

## 2017-09-09 LAB — BASIC METABOLIC PANEL
BUN/Creatinine Ratio: 15 (ref 10–24)
BUN: 16 mg/dL (ref 8–27)
CO2: 24 mmol/L (ref 20–29)
Calcium: 9.3 mg/dL (ref 8.6–10.2)
Chloride: 100 mmol/L (ref 96–106)
Creatinine, Ser: 1.1 mg/dL (ref 0.76–1.27)
GFR, EST AFRICAN AMERICAN: 74 mL/min/{1.73_m2} (ref >59)
GFR, EST NON AFRICAN AMERICAN: 64 mL/min/{1.73_m2} (ref >59)
Glucose: 104 mg/dL — ABNORMAL HIGH (ref 65–99)
POTASSIUM: 4.1 mmol/L (ref 3.5–5.2)
Sodium: 140 mmol/L (ref 134–144)

## 2017-09-09 LAB — LIPID PANEL
Chol/HDL Ratio: 5.7 ratio — ABNORMAL HIGH (ref 0.0–5.0)
Cholesterol, Total: 216 mg/dL — ABNORMAL HIGH (ref 100–199)
HDL: 38 mg/dL — ABNORMAL LOW (ref >39)
LDL Calculated: 137 mg/dL — ABNORMAL HIGH (ref 0–99)
TRIGLYCERIDES: 206 mg/dL — AB (ref 0–149)
VLDL Cholesterol Cal: 41 mg/dL — ABNORMAL HIGH (ref 5–40)

## 2017-09-09 LAB — HEPATIC FUNCTION PANEL
ALK PHOS: 70 IU/L (ref 39–117)
ALT: 13 IU/L (ref 0–44)
AST: 18 IU/L (ref 0–40)
Albumin: 4.6 g/dL (ref 3.5–4.8)
Bilirubin Total: 0.9 mg/dL (ref 0.0–1.2)
Bilirubin, Direct: 0.21 mg/dL (ref 0.00–0.40)
TOTAL PROTEIN: 6.9 g/dL (ref 6.0–8.5)

## 2017-09-09 LAB — TSH: TSH: 5.11 u[IU]/mL — ABNORMAL HIGH (ref 0.450–4.500)

## 2017-09-18 ENCOUNTER — Other Ambulatory Visit: Payer: Self-pay | Admitting: *Deleted

## 2017-09-18 DIAGNOSIS — I10 Essential (primary) hypertension: Secondary | ICD-10-CM

## 2017-09-18 DIAGNOSIS — E782 Mixed hyperlipidemia: Secondary | ICD-10-CM

## 2017-09-18 DIAGNOSIS — Z1329 Encounter for screening for other suspected endocrine disorder: Secondary | ICD-10-CM

## 2017-09-18 LAB — LIPID PANEL

## 2017-09-18 LAB — BASIC METABOLIC PANEL

## 2017-09-18 LAB — HEPATIC FUNCTION PANEL

## 2017-09-19 LAB — BASIC METABOLIC PANEL
BUN/Creatinine Ratio: 10 (ref 10–24)
BUN: 10 mg/dL (ref 8–27)
CHLORIDE: 95 mmol/L — AB (ref 96–106)
CO2: 24 mmol/L (ref 20–29)
Calcium: 9.2 mg/dL (ref 8.6–10.2)
Creatinine, Ser: 1 mg/dL (ref 0.76–1.27)
GFR calc Af Amer: 83 mL/min/{1.73_m2} (ref >59)
GFR calc non Af Amer: 72 mL/min/{1.73_m2} (ref >59)
GLUCOSE: 98 mg/dL (ref 65–99)
Potassium: 4.5 mmol/L (ref 3.5–5.2)
Sodium: 132 mmol/L — ABNORMAL LOW (ref 134–144)

## 2017-09-19 LAB — LIPID PANEL
Chol/HDL Ratio: 2.6 ratio (ref 0.0–5.0)
Cholesterol, Total: 184 mg/dL (ref 100–199)
HDL: 71 mg/dL (ref >39)
LDL Calculated: 92 mg/dL (ref 0–99)
Triglycerides: 107 mg/dL (ref 0–149)
VLDL CHOLESTEROL CAL: 21 mg/dL (ref 5–40)

## 2017-09-19 LAB — HEPATIC FUNCTION PANEL
ALK PHOS: 47 IU/L (ref 39–117)
ALT: 16 IU/L (ref 0–44)
AST: 17 IU/L (ref 0–40)
Albumin: 4.1 g/dL (ref 3.5–4.8)
BILIRUBIN, DIRECT: 0.14 mg/dL (ref 0.00–0.40)
Bilirubin Total: 0.3 mg/dL (ref 0.0–1.2)
TOTAL PROTEIN: 6.5 g/dL (ref 6.0–8.5)

## 2017-09-19 LAB — TSH: TSH: 1.19 u[IU]/mL (ref 0.450–4.500)

## 2017-09-29 ENCOUNTER — Encounter: Payer: Self-pay | Admitting: Cardiology

## 2017-09-29 ENCOUNTER — Ambulatory Visit (INDEPENDENT_AMBULATORY_CARE_PROVIDER_SITE_OTHER): Payer: Medicare HMO | Admitting: Cardiology

## 2017-09-29 VITALS — BP 140/74 | HR 61 | Ht 70.0 in | Wt 193.0 lb

## 2017-09-29 DIAGNOSIS — I2510 Atherosclerotic heart disease of native coronary artery without angina pectoris: Secondary | ICD-10-CM | POA: Diagnosis not present

## 2017-09-29 DIAGNOSIS — Z951 Presence of aortocoronary bypass graft: Secondary | ICD-10-CM | POA: Diagnosis not present

## 2017-09-29 DIAGNOSIS — I10 Essential (primary) hypertension: Secondary | ICD-10-CM | POA: Diagnosis not present

## 2017-09-29 DIAGNOSIS — R079 Chest pain, unspecified: Secondary | ICD-10-CM | POA: Insufficient documentation

## 2017-09-29 DIAGNOSIS — I209 Angina pectoris, unspecified: Secondary | ICD-10-CM

## 2017-09-29 DIAGNOSIS — E782 Mixed hyperlipidemia: Secondary | ICD-10-CM

## 2017-09-29 HISTORY — DX: Chest pain, unspecified: R07.9

## 2017-09-29 NOTE — Patient Instructions (Signed)
Medication Instructions:  Your physician recommends that you continue on your current medications as directed. Please refer to the Current Medication list given to you today.  Labwork: None  Testing/Procedures: Your physician has requested that you have en exercise stress myoview. For further information please visit [email or web address] Please follow instruction sheet, as given.  [facility] [address], [place], [place] [address] [phone]  Lexiscan testing instructions:  Please present to [facility] 15 minutes earlier than your appointment time to allow for registration.  You will be called with an appointment date and time by our office once it has been scheduled; please allow at least 48 hours for us to contact you.  No food or drink after midnight prior to your test (except for small sips of water with your medications).  Hold the following medications the morning of your test: furosemide  Bring a medication list or all your medications with you the morning of the testing.  No caffeine, decaffeinated or chocolate products 12 hours prior to the testing.  Please be aware that the test can take up to 3-4 hours.  Should you have any problem with the appointment date or time, please call [phone].   Please call the office with any further questions or concerns.   Follow-Up: Your physician recommends that you schedule a follow-up appointment in: 2 months  Any Other Special Instructions Will Be Listed Below (If Applicable).     If you need a refill on your cardiac medications before your next appointment, please call your pharmacy.   [facility]  Garey HamAshley A, RN, BSN

## 2017-09-29 NOTE — Progress Notes (Signed)
Cardiology Office Note:    Date:  09/29/2017   ID:  Austin Malone, DOB Jan 10, 1941, MRN [id number]  PCP:  Shelbie Ammons, MD  Cardiologist:  Garwin Brothers, MD   Referring MD: Shelbie Ammons, MD    ASSESSMENT:    1. Coronary artery disease involving native coronary artery, angina presence unspecified, unspecified whether native or transplanted heart   2. Angina pectoris (HCC)   3. Coronary artery disease involving native coronary artery of native heart, angina presence unspecified   4. Essential hypertension   5. Hx of CABG   6. Mixed hyperlipidemia   7. Chest pain, unspecified type    PLAN:    In order of problems listed above:  1. The patient's symptoms are concerning.  Secondary prevention stressed to the patient.  Importance of compliance with diet and medication stressed and he vocalized understanding. 2. Coronary angiography report was detailed with the patient.  I discussed labs with him also. 3. In view of the symptoms I would like to get a exercise stress Cardiolite to understand if he has any evidence objectively of ischemia.  If he does we will attend to this. 4. Patient will be seen in follow-up appointment in 2 months or earlier if the patient has any concerns    Medication Adjustments/Labs and Tests Ordered: Current medicines are reviewed at length with the patient today.  Concerns regarding medicines are outlined above.  Orders Placed This Encounter  Procedures  . MYOCARDIAL PERFUSION IMAGING   No orders of the defined types were placed in this encounter.    No chief complaint on file.    History of Present Illness:    Austin Malone is a 77 y.o. male.  The patient has coronary artery disease.  He had significant symptoms suggestive of angina.  He was sent to [facility] for coronary angiography and reveals obstructive disease which is not optimal and ideal for revascularization.  Patient gives a mixed picture in her history.  He mentions to  me that he can walk about half an hour without any problems.  At the same time he mentions to me that he wakes up in the night with some chest pain and uses nitroglycerin.  No orthopnea or PND.  At the time of my evaluation, the patient is alert awake oriented and in no distress.  Past Medical History:  Diagnosis Date  . Anxiety   . Arthritis   . BPH (benign prostatic hyperplasia)   . CAD (coronary artery disease)   . Cataracts, bilateral   . Enlarged prostate   . GERD (gastroesophageal reflux disease)   . Glaucoma   . Hyperlipidemia   . Hypertension   . Non-ST elevation (NSTEMI) myocardial infarction (HCC) 08/21/2017  . Pneumonia   . PUD (peptic ulcer disease)   . Rotator cuff tear     Past Surgical History:  Procedure Laterality Date  . APPENDECTOMY    . CORONARY ANGIOPLASTY WITH STENT PLACEMENT  11/2014   PCI of SVG to PDA x 2 with Xience DES to proximal stenosis, Ultra 5.0 BMS to distal thrombotic lesion  . CORONARY ARTERY BYPASS GRAFT  2008  . EYE SURGERY    . LEFT HEART CATH AND CORS/GRAFTS ANGIOGRAPHY N/A 08/21/2017   Procedure: LEFT HEART CATH AND CORS/GRAFTS ANGIOGRAPHY;  Surgeon: Yvonne Kendall, MD;  Location: [facility];  Service: Cardiovascular;  Laterality: N/A;  . LEG SURGERY    . TONSILLECTOMY      Current Medications: Current  Meds  Medication Sig  . alfuzosin (UROXATRAL) 10 MG 24 hr tablet Take 1 tablet by mouth daily.  Marland Kitchen. ALPRAZolam (XANAX) 0.5 MG tablet Take 0.5 mg by mouth 4 (four) times daily as needed for anxiety or sleep.   Marland Kitchen. aspirin EC 81 MG tablet Take 81 mg by mouth daily.  . bethanechol (URECHOLINE) 50 MG tablet Take 50 mg by mouth 3 (three) times daily.  . carvedilol (COREG) 6.25 MG tablet Take 1 tablet by mouth 2 (two) times daily.  . Cholecalciferol (VITAMIN D-1000 MAX ST) 1000 units tablet Take 1 capsule by mouth daily.  . Coenzyme Q10 100 MG capsule Take 1 capsule by mouth daily.  . dorzolamide-timolol (COSOPT) 22.3-6.8 MG/ML ophthalmic  solution Place 1 drop into both eyes 2 (two) times daily.   . finasteride (PROSCAR) 5 MG tablet Take 5 mg by mouth daily.   . folic acid (FOLVITE) 1 MG tablet Take 1 tablet (1 mg total) by mouth daily.  . furosemide (LASIX) 20 MG tablet Take 20 mg by mouth daily as needed for fluid.   Marland Kitchen. MELATONIN PO Take 20 mg by mouth at bedtime.   . Multiple Vitamin (MULTIVITAMIN WITH MINERALS) TABS tablet Take 1 tablet by mouth daily.  . nitroGLYCERIN (NITROSTAT) 0.4 MG SL tablet Place 0.4 mg under the tongue every 5 (five) minutes as needed for chest pain.   Marland Kitchen. ondansetron (ZOFRAN) 8 MG tablet Take 8 mg by mouth every 8 (eight) hours as needed for nausea or vomiting.   . pantoprazole (PROTONIX) 40 MG tablet Take 40 mg by mouth daily.   . ranolazine (RANEXA) 1000 MG SR tablet Take 1,000 mg by mouth 2 (two) times daily.   Marland Kitchen. thiamine 100 MG tablet Take 1 tablet (100 mg total) by mouth daily.  . TRAVATAN Z 0.004 % SOLN ophthalmic solution Place 1 drop into both eyes at bedtime.   . vitamin C (ASCORBIC ACID) 500 MG tablet Take 1,000 mg by mouth 2 (two) times daily.      Allergies:   Codeine; Lisinopril; and Contrast media [iodinated diagnostic agents]   Social History   Socioeconomic History  . Marital status: Married    Spouse name: Not on file  . Number of children: Not on file  . Years of education: Not on file  . Highest education level: Not on file  Occupational History  . Occupation: Retired  Engineer, productionocial Needs  . Financial resource strain: Not on file  . Food insecurity:    Worry: Not on file    Inability: Not on file  . Transportation needs:    Medical: Not on file    Non-medical: Not on file  Tobacco Use  . Smoking status: Former Games developermoker  . Smokeless tobacco: Never Used  Substance and Sexual Activity  . Alcohol use: Yes    Alcohol/week: 21.0 standard drinks    Types: 21 Cans of beer per week  . Drug use: Yes    Types: Marijuana  . Sexual activity: Not on file  Lifestyle  . Physical  activity:    Days per week: Not on file    Minutes per session: Not on file  . Stress: Not on file  Relationships  . Social connections:    Talks on phone: Not on file    Gets together: Not on file    Attends religious service: Not on file    Active member of club or organization: Not on file    Attends meetings of clubs or organizations:  Not on file    Relationship status: Not on file  Other Topics Concern  . Not on file  Social History Narrative   He lives in [place] alone.     Family History: The patient's family history includes Diabetes in his mother; Heart attack in his brother.  ROS:   Please see the history of present illness.    All other systems reviewed and are negative.  EKGs/Labs/Other Studies Reviewed:    The following studies were reviewed today: I discussed my findings with the patient at extensive length.  Coronary angiography report was discussed with the patient.   Recent Labs: 08/23/2017: Hemoglobin 10.1; Platelets 210 09/18/2017: ALT 16; BUN 10; Creatinine, Ser 1.00; Potassium 4.5; Sodium 132; TSH 1.190  Recent Lipid Panel    Component Value Date/Time   CHOL 184 09/18/2017 1442   TRIG 107 09/18/2017 1442   HDL 71 09/18/2017 1442   CHOLHDL 2.6 09/18/2017 1442   LDLCALC 92 09/18/2017 1442    Physical Exam:    VS:  BP 140/74 (BP Location: Right Arm, Patient Position: Sitting, Cuff Size: Normal)   Pulse 61   Ht 5\' 10"  (1.778 m)   Wt 193 lb (87.5 kg)   SpO2 97%   BMI 27.69 kg/m     Wt Readings from Last 3 Encounters:  09/29/17 193 lb (87.5 kg)  09/08/17 194 lb (88 kg)  08/23/17 180 lb 3.2 oz (81.7 kg)     GEN: Patient is in no acute distress HEENT: Normal NECK: No JVD; No carotid bruits LYMPHATICS: No lymphadenopathy CARDIAC: Hear sounds regular, 2/6 systolic murmur at the apex. RESPIRATORY:  Clear to auscultation without rales, wheezing or rhonchi  ABDOMEN: Soft, non-tender, non-distended MUSCULOSKELETAL:  No edema; No  deformity  SKIN: Warm and dry NEUROLOGIC:  Alert and oriented x 3 PSYCHIATRIC:  Normal affect   Signed, Garwin Brothers, MD  09/29/2017 11:21 AM    [facility]

## 2017-10-02 DIAGNOSIS — R0789 Other chest pain: Secondary | ICD-10-CM | POA: Diagnosis not present

## 2017-11-30 ENCOUNTER — Ambulatory Visit (INDEPENDENT_AMBULATORY_CARE_PROVIDER_SITE_OTHER): Payer: Medicare HMO | Admitting: Cardiology

## 2017-11-30 ENCOUNTER — Encounter: Payer: Self-pay | Admitting: Cardiology

## 2017-11-30 VITALS — BP 120/60 | HR 62 | Ht 70.0 in | Wt 195.0 lb

## 2017-11-30 DIAGNOSIS — E782 Mixed hyperlipidemia: Secondary | ICD-10-CM | POA: Diagnosis not present

## 2017-11-30 DIAGNOSIS — I10 Essential (primary) hypertension: Secondary | ICD-10-CM | POA: Diagnosis not present

## 2017-11-30 DIAGNOSIS — I2510 Atherosclerotic heart disease of native coronary artery without angina pectoris: Secondary | ICD-10-CM

## 2017-11-30 NOTE — Progress Notes (Signed)
Cardiology Office Note:    Date:  11/30/2017   ID:  Austin Malone, DOB 10-Oct-1940, MRN [id number]  PCP:  Austin AmmonsHaque, Austin P, MD  Cardiologist:  Austin Brothersajan R Revankar, MD   Referring MD: Austin AmmonsHaque, Austin P, MD    ASSESSMENT:    No diagnosis found. PLAN:    In order of problems listed above:  1. Secondary prevention stressed with the patient.  Importance of compliance with diet and medication stressed.  His blood pressure is stable. 2. I discussed lab work with him at length and congratulated him about his walking program.  I told him to be compliant with diet.  He will be seen in follow-up appointment in 6 months or earlier if he has any concerns.   Medication Adjustments/Labs and Tests Ordered: Current medicines are reviewed at length with the patient today.  Concerns regarding medicines are outlined above.  No orders of the defined types were placed in this encounter.  No orders of the defined types were placed in this encounter.    No chief complaint on file.    History of Present Illness:    Austin Malone is a 77 y.o. male.  Patient has history of coronary artery disease and no intervention was done because of the anatomy of the disease.  Patient denies any problems at this time and takes care of activities of daily living.  No chest pain orthopnea or PND.  At the time of my evaluation, the patient is alert awake oriented and in no distress.  He walks 30 minutes a day on a regular basis.  Past Medical History:  Diagnosis Date  . Anxiety   . Arthritis   . BPH (benign prostatic hyperplasia)   . CAD (coronary artery disease)   . Cataracts, bilateral   . Enlarged prostate   . GERD (gastroesophageal reflux disease)   . Glaucoma   . Hyperlipidemia   . Hypertension   . Non-ST elevation (NSTEMI) myocardial infarction (HCC) 08/21/2017  . Pneumonia   . PUD (peptic ulcer disease)   . Rotator cuff tear     Past Surgical History:  Procedure Laterality Date  . APPENDECTOMY    .  CORONARY ANGIOPLASTY WITH STENT PLACEMENT  11/2014   PCI of SVG to PDA x 2 with Xience DES to proximal stenosis, Ultra 5.0 BMS to distal thrombotic lesion  . CORONARY ARTERY BYPASS GRAFT  2008  . EYE SURGERY    . LEFT HEART CATH AND CORS/GRAFTS ANGIOGRAPHY N/A 08/21/2017   Procedure: LEFT HEART CATH AND CORS/GRAFTS ANGIOGRAPHY;  Surgeon: Yvonne KendallEnd, Christopher, MD;  [facility];  Service: Cardiovascular;  Laterality: N/A;  . LEG SURGERY    . TONSILLECTOMY      Current Medications: Current Meds  Medication Sig  . alfuzosin (UROXATRAL) 10 MG 24 hr tablet Take 1 tablet by mouth daily.  Marland Kitchen. ALPRAZolam (XANAX) 0.5 MG tablet Take 0.5 mg by mouth 4 (four) times daily as needed for anxiety or sleep.   Marland Kitchen. aspirin EC 81 MG tablet Take 81 mg by mouth daily.  . bethanechol (URECHOLINE) 50 MG tablet Take 50 mg by mouth 3 (three) times daily.  . carvedilol (COREG) 6.25 MG tablet Take 1 tablet by mouth 2 (two) times daily.  . Cholecalciferol (VITAMIN D-1000 MAX ST) 1000 units tablet Take 1 capsule by mouth daily.  . Coenzyme Q10 100 MG capsule Take 1 capsule by mouth daily.  . dorzolamide-timolol (COSOPT) 22.3-6.8 MG/ML ophthalmic solution Place 1 drop into both eyes 2 (  two) times daily.   . finasteride (PROSCAR) 5 MG tablet Take 5 mg by mouth daily.   . folic acid (FOLVITE) 1 MG tablet Take 1 tablet (1 mg total) by mouth daily.  . furosemide (LASIX) 20 MG tablet Take 20 mg by mouth daily as needed for fluid.   . isosorbide mononitrate (IMDUR) 30 MG 24 hr tablet Take 1 tablet (30 mg total) by mouth daily.  Marland Kitchen MELATONIN PO Take 20 mg by mouth at bedtime.   . Multiple Vitamin (MULTIVITAMIN WITH MINERALS) TABS tablet Take 1 tablet by mouth daily.  . nitroGLYCERIN (NITROSTAT) 0.4 MG SL tablet Place 0.4 mg under the tongue every 5 (five) minutes as needed for chest pain.   Marland Kitchen ondansetron (ZOFRAN) 8 MG tablet Take 8 mg by mouth every 8 (eight) hours as needed for nausea or vomiting.   . pantoprazole  (PROTONIX) 40 MG tablet Take 40 mg by mouth daily.   . ranolazine (RANEXA) 1000 MG SR tablet Take 1,000 mg by mouth 2 (two) times daily.   Marland Kitchen thiamine 100 MG tablet Take 1 tablet (100 mg total) by mouth daily.  . TRAVATAN Z 0.004 % SOLN ophthalmic solution Place 1 drop into both eyes at bedtime.   . vitamin C (ASCORBIC ACID) 500 MG tablet Take 1,000 mg by mouth 2 (two) times daily.      Allergies:   Codeine; Lisinopril; and Contrast media [iodinated diagnostic agents]   Social History   Socioeconomic History  . Marital status: Married    Spouse name: Not on file  . Number of children: Not on file  . Years of education: Not on file  . Highest education level: Not on file  Occupational History  . Occupation: Retired  Engineer, production  . Financial resource strain: Not on file  . Food insecurity:    Worry: Not on file    Inability: Not on file  . Transportation needs:    Medical: Not on file    Non-medical: Not on file  Tobacco Use  . Smoking status: Former Games developer  . Smokeless tobacco: Never Used  Substance and Sexual Activity  . Alcohol use: Yes    Alcohol/week: 21.0 standard drinks    Types: 21 Cans of beer per week  . Drug use: Yes    Types: Marijuana  . Sexual activity: Not on file  Lifestyle  . Physical activity:    Days per week: Not on file    Minutes per session: Not on file  . Stress: Not on file  Relationships  . Social connections:    Talks on phone: Not on file    Gets together: Not on file    Attends religious service: Not on file    Active member of club or organization: Not on file    Attends meetings of clubs or organizations: Not on file    Relationship status: Not on file  Other Topics Concern  . Not on file  Social History Narrative   He lives in [place], [place] alone.     Family History: The patient's family history includes Diabetes in his mother; Heart attack in his brother.  ROS:   Please see the history of present illness.    All  other systems reviewed and are negative.  EKGs/Labs/Other Studies Reviewed:    The following studies were reviewed today: I discussed my findings with the patient at length including lab work   Recent Labs: 08/23/2017: Hemoglobin 10.1; Platelets 210 09/18/2017: ALT 16; BUN  10; Creatinine, Ser 1.00; Potassium 4.5; Sodium 132; TSH 1.190  Recent Lipid Panel    Component Value Date/Time   CHOL 184 09/18/2017 1442   TRIG 107 09/18/2017 1442   HDL 71 09/18/2017 1442   CHOLHDL 2.6 09/18/2017 1442   LDLCALC 92 09/18/2017 1442    Physical Exam:    VS:  BP 120/60 (BP Location: Right Arm, Patient Position: Sitting, Cuff Size: Normal)   Pulse 62   Ht 5\' 10"  (1.778 m)   Wt 195 lb (88.5 kg)   SpO2 98%   BMI 27.98 kg/m     Wt Readings from Last 3 Encounters:  11/30/17 195 lb (88.5 kg)  09/29/17 193 lb (87.5 kg)  09/08/17 194 lb (88 kg)     GEN: Patient is in no acute distress HEENT: Normal NECK: No JVD; No carotid bruits LYMPHATICS: No lymphadenopathy CARDIAC: Hear sounds regular, 2/6 systolic murmur at the apex. RESPIRATORY:  Clear to auscultation without rales, wheezing or rhonchi  ABDOMEN: Soft, non-tender, non-distended MUSCULOSKELETAL:  No edema; No deformity  SKIN: Warm and dry NEUROLOGIC:  Alert and oriented x 3 PSYCHIATRIC:  Normal affect   Signed, Austin Brothers, MD  11/30/2017 2:46 PM    [facility]

## 2017-11-30 NOTE — Patient Instructions (Signed)
Medication Instructions:  Your physician recommends that you continue on your current medications as directed. Please refer to the Current Medication list given to you today.  If you need a refill on your cardiac medications before your next appointment, please call your pharmacy.   Lab work: None  If you have labs (blood work) drawn today and your tests are completely normal, you will receive your results only by: . MyChart Message (if you have MyChart) OR . A paper copy in the mail If you have any lab test that is abnormal or we need to change your treatment, we will call you to review the results.  Testing/Procedures: None  Follow-Up: At [facility], you and your health needs are our priority.  As part of our continuing mission to provide you with exceptional heart care, we have created designated Provider Care Teams.  These Care Teams include your primary Cardiologist (physician) and Advanced Practice Providers (APPs -  Physician Assistants and Nurse Practitioners) who all work together to provide you with the care you need, when you need it.  You will need a follow up appointment in 6 months.  Please call our office 2 months in advance to schedule this appointment.  You may see another member of our [facility] Provider Team in [place]: Robert Krasowski, MD . Brian Munley, MD  Any Other Special Instructions Will Be Listed Below (If Applicable).    

## 2018-01-25 ENCOUNTER — Telehealth: Payer: Self-pay | Admitting: Cardiology

## 2018-01-25 NOTE — Telephone Encounter (Signed)
Having a lot of trouble at night with CP, has taking all of his nitro and wants to know what to do next

## 2018-01-25 NOTE — Telephone Encounter (Signed)
I agree with you if he has chest pain he can use nitroglycerin and if this does not help he knows to go to the nearest emergency room.  Also give him an appointment with me in the next few days.  Start ranolazine 500 mg twice daily.

## 2018-01-25 NOTE — Telephone Encounter (Signed)
Please advise 

## 2018-01-25 NOTE — Telephone Encounter (Signed)
Attempted to call patient. No answer. Will continue efforts.  

## 2018-01-25 NOTE — Telephone Encounter (Signed)
Patient reports having chest pain that is described as an ache on both sides almost every night that has increased over 1 week. He reports the pain radiates to both arms, and is relieved with nitroglycerin. He denies any shortness of breath. I advised patient if having active chest pain he should report to the emergency department, he verbally understands. Patient denies any pain at this time. Will route to Dr. Tomie China for further recommendation.

## 2018-01-26 NOTE — Telephone Encounter (Signed)
Patient scheduled to see Dr. Josiah Loboevenkar tomorrow. Patient is already on Ranexa 1000 mg twice daily. Will inform Dr. Tomie Chinaevankar.

## 2018-01-26 NOTE — Telephone Encounter (Signed)
Left message for patient to return call.

## 2018-01-27 ENCOUNTER — Ambulatory Visit (INDEPENDENT_AMBULATORY_CARE_PROVIDER_SITE_OTHER): Payer: Medicare HMO | Admitting: Cardiology

## 2018-01-27 ENCOUNTER — Encounter: Payer: Self-pay | Admitting: Cardiology

## 2018-01-27 VITALS — BP 140/68 | HR 64 | Ht 70.0 in | Wt 196.0 lb

## 2018-01-27 DIAGNOSIS — I10 Essential (primary) hypertension: Secondary | ICD-10-CM | POA: Diagnosis not present

## 2018-01-27 DIAGNOSIS — I2510 Atherosclerotic heart disease of native coronary artery without angina pectoris: Secondary | ICD-10-CM | POA: Diagnosis not present

## 2018-01-27 DIAGNOSIS — E782 Mixed hyperlipidemia: Secondary | ICD-10-CM

## 2018-01-27 DIAGNOSIS — I209 Angina pectoris, unspecified: Secondary | ICD-10-CM | POA: Diagnosis not present

## 2018-01-27 DIAGNOSIS — Z951 Presence of aortocoronary bypass graft: Secondary | ICD-10-CM | POA: Diagnosis not present

## 2018-01-27 NOTE — Progress Notes (Signed)
Cardiology Office Note:    Date:  01/27/2018   ID:  Austin Malone, DOB October 05, 1940, MRN [id number]  PCP:  Shelbie AmmonsHaque, Imran P, MD  Cardiologist:  Garwin Brothersajan R , MD   Referring MD: Shelbie AmmonsHaque, Imran P, MD    ASSESSMENT:    1. Mixed hyperlipidemia   2. Essential hypertension   3. Coronary artery disease involving native coronary artery without angina pectoris, unspecified whether native or transplanted heart   4. Hx of CABG   5. Angina pectoris (HCC)    PLAN:    In order of problems listed above:  1. Secondary prevention stressed with the patient.  Importance of compliance with diet and medication stressed and he vocalized understanding.  His blood pressure is stable.  Diet was discussed for dyslipidemia.  Lipids were recently checked by his primary care physician. 2. In view of his symptoms I wanted to double of his isosorbide therapy however he does not know his exact dose who is going to go home and give us a call about this.  We will up titrate his medications accordingly.  This was also the impression of his interventionalists who did his coronary angiography.  I would like to see how he responds to this.  I also think in view of his anginal-like symptoms he will benefit from cardiac therapy and maximizing medical therapy so I will refer him [facility]. 3. Follow-up appointment in a month or earlier if he has any concerns.   Medication Adjustments/Labs and Tests Ordered: Current medicines are reviewed at length with the patient today.  Concerns regarding medicines are outlined above.  No orders of the defined types were placed in this encounter.  No orders of the defined types were placed in this encounter.    No chief complaint on file.    History of Present Illness:    Austin Malone is a 78 y.o. male.  Patient has known coronary artery disease and underwent coronary angiography with significant stenosis.  Because of the anatomy of the stenosis  medical was recommended.  Patient is doing fine.  He mentions to me that he has chest pains at night.  Sometimes these are relieved with nitroglycerin.  He also mentions to me that he walks 30 minutes many at times without any symptoms.  I am a little perplexed with his history.  No orthopnea or PND.  He appears comfortable at my evaluation today.  At the time of my evaluation, the patient is alert awake oriented and in no distress.  Past Medical History:  Diagnosis Date  . Anxiety   . Arthritis   . BPH (benign prostatic hyperplasia)   . CAD (coronary artery disease)   . Cataracts, bilateral   . Enlarged prostate   . GERD (gastroesophageal reflux disease)   . Glaucoma   . Hyperlipidemia   . Hypertension   . Non-ST elevation (NSTEMI) myocardial infarction (HCC) 08/21/2017  . Pneumonia   . PUD (peptic ulcer disease)   . Rotator cuff tear     Past Surgical History:  Procedure Laterality Date  . APPENDECTOMY    . CORONARY ANGIOPLASTY WITH STENT PLACEMENT  11/2014   PCI of SVG to PDA x 2 with Xience DES to proximal stenosis, Ultra 5.0 BMS to distal thrombotic lesion  . CORONARY ARTERY BYPASS GRAFT  2008  . EYE SURGERY    . LEFT HEART CATH AND CORS/GRAFTS ANGIOGRAPHY N/A 08/21/2017   Procedure: LEFT HEART CATH AND CORS/GRAFTS ANGIOGRAPHY;  Surgeon: End,  Cristal Deer, MD;  Location: [facility];  Service: Cardiovascular;  Laterality: N/A;  . LEG SURGERY    . TONSILLECTOMY      Current Medications: Current Meds  Medication Sig  . alfuzosin (UROXATRAL) 10 MG 24 hr tablet Take 1 tablet by mouth daily.  Marland Kitchen ALPRAZolam (XANAX) 0.5 MG tablet Take 0.5 mg by mouth 4 (four) times daily as needed for anxiety or sleep.   Marland Kitchen aspirin EC 81 MG tablet Take 81 mg by mouth daily.  . bethanechol (URECHOLINE) 50 MG tablet Take 50 mg by mouth 3 (three) times daily.  . carvedilol (COREG) 6.25 MG tablet Take 1 tablet by mouth 2 (two) times daily.  . Cholecalciferol (VITAMIN D-1000 MAX ST) 1000 units  tablet Take 1 capsule by mouth daily.  . Coenzyme Q10 100 MG capsule Take 1 capsule by mouth daily.  . dorzolamide-timolol (COSOPT) 22.3-6.8 MG/ML ophthalmic solution Place 1 drop into both eyes 2 (two) times daily.   . finasteride (PROSCAR) 5 MG tablet Take 5 mg by mouth daily.   . folic acid (FOLVITE) 1 MG tablet Take 1 tablet (1 mg total) by mouth daily.  . furosemide (LASIX) 20 MG tablet Take 20 mg by mouth daily as needed for fluid.   . isosorbide mononitrate (IMDUR) 30 MG 24 hr tablet Take 1 tablet (30 mg total) by mouth daily.  Marland Kitchen MELATONIN PO Take 20 mg by mouth at bedtime.   . nitroGLYCERIN (NITROSTAT) 0.4 MG SL tablet Place 0.4 mg under the tongue every 5 (five) minutes as needed for chest pain.   Marland Kitchen ondansetron (ZOFRAN) 8 MG tablet Take 8 mg by mouth every 8 (eight) hours as needed for nausea or vomiting.   . pantoprazole (PROTONIX) 40 MG tablet Take 40 mg by mouth daily.   . ranolazine (RANEXA) 1000 MG SR tablet Take 1,000 mg by mouth 2 (two) times daily.   Marland Kitchen thiamine 100 MG tablet Take 1 tablet (100 mg total) by mouth daily.  . vitamin C (ASCORBIC ACID) 500 MG tablet Take 1,000 mg by mouth 2 (two) times daily.      Allergies:   Codeine; Lisinopril; and Contrast media [iodinated diagnostic agents]   Social History   Socioeconomic History  . Marital status: Married    Spouse name: Not on file  . Number of children: Not on file  . Years of education: Not on file  . Highest education level: Not on file  Occupational History  . Occupation: Retired  Engineer, production  . Financial resource strain: Not on file  . Food insecurity:    Worry: Not on file    Inability: Not on file  . Transportation needs:    Medical: Not on file    Non-medical: Not on file  Tobacco Use  . Smoking status: Former Games developer  . Smokeless tobacco: Never Used  Substance and Sexual Activity  . Alcohol use: Yes    Alcohol/week: 21.0 standard drinks    Types: 21 Cans of beer per week  . Drug use: Yes     Types: Marijuana  . Sexual activity: Not on file  Lifestyle  . Physical activity:    Days per week: Not on file    Minutes per session: Not on file  . Stress: Not on file  Relationships  . Social connections:    Talks on phone: Not on file    Gets together: Not on file    Attends religious service: Not on file    Active  member of club or organization: Not on file    Attends meetings of clubs or organizations: Not on file    Relationship status: Not on file  Other Topics Concern  . Not on file  Social History Narrative   [place].     Family History: The patient's family history includes Diabetes in his mother; Heart attack in his brother.  ROS:   Please see the history of present illness.    All other systems reviewed and are negative.  EKGs/Labs/Other Studies Reviewed:    The following studies were reviewed today: EKG reveals sinus rhythm and nonspecific ST-T changes.   Recent Labs: 08/23/2017: Hemoglobin 10.1; Platelets 210 09/18/2017: ALT 16; BUN 10; Creatinine, Ser 1.00; Potassium 4.5; Sodium 132; TSH 1.190  Recent Lipid Panel    Component Value Date/Time   CHOL 184 09/18/2017 1442   TRIG 107 09/18/2017 1442   HDL 71 09/18/2017 1442   CHOLHDL 2.6 09/18/2017 1442   LDLCALC 92 09/18/2017 1442    Physical Exam:    VS:  BP 140/68 (BP Location: Right Arm, Patient Position: Sitting, Cuff Size: Normal)   Pulse 64   Ht 5\' 10"  (1.778 m)   Wt 196 lb (88.9 kg)   SpO2 98%   BMI 28.12 kg/m     Wt Readings from Last 3 Encounters:  01/27/18 196 lb (88.9 kg)  11/30/17 195 lb (88.5 kg)  09/29/17 193 lb (87.5 kg)     GEN: Patient is in no acute distress HEENT: Normal NECK: No JVD; No carotid bruits LYMPHATICS: No lymphadenopathy CARDIAC: Hear sounds regular, 2/6 systolic murmur at the apex. RESPIRATORY:  Clear to auscultation without rales, wheezing or rhonchi  ABDOMEN: Soft, non-tender, non-distended MUSCULOSKELETAL:  No edema;  No deformity  SKIN: Warm and dry NEUROLOGIC:  Alert and oriented x 3 PSYCHIATRIC:  Normal affect   Signed, Garwin Brothersajan R , MD  01/27/2018 2:17 PM    [facility]

## 2018-01-27 NOTE — Patient Instructions (Signed)
Medication Instructions:   Your physician recommends that you continue on your current medications as directed. Please refer to the Current Medication list given to you today.  If you need a refill on your cardiac medications before your next appointment, please call your pharmacy.   Lab work:  NONE  If you have labs (blood work) drawn today and your tests are completely normal, you will receive your results only by: Marland Kitchen MyChart Message (if you have MyChart) OR . A paper copy in the mail If you have any lab test that is abnormal or we need to change your treatment, we will call you to review the results.  Testing/Procedures:  You have been referred to Cardiac Rehab. This will be at [facility]. Scheduling will contact you for appointment.   Follow-Up: At [facility], you and your health needs are our priority.  As part of our continuing mission to provide you with exceptional heart care, we have created designated Provider Care Teams.  These Care Teams include your primary Cardiologist (physician) and Advanced Practice Providers (APPs -  Physician Assistants and Nurse Practitioners) who all work together to provide you with the care you need, when you need it.  . You will need a follow up appointment in 1 months.

## 2018-01-27 NOTE — Addendum Note (Signed)
Addended by: Carren Rang on: 01/27/2018 03:07 PM   Modules accepted: Orders

## 2018-01-30 ENCOUNTER — Inpatient Hospital Stay: Admit: 2018-01-30 | Payer: Self-pay | Admitting: Family Medicine

## 2018-01-31 ENCOUNTER — Observation Stay (HOSPITAL_COMMUNITY)
Admission: AD | Admit: 2018-01-31 | Discharge: 2018-02-02 | Disposition: A | Discharge status: Home / Self Care | Payer: Medicare HMO | Source: Other Acute Inpatient Hospital | Attending: Internal Medicine | Admitting: Internal Medicine

## 2018-01-31 ENCOUNTER — Encounter (HOSPITAL_COMMUNITY): Payer: Self-pay | Admitting: Family Medicine

## 2018-01-31 DIAGNOSIS — Z888 Allergy status to other drugs, medicaments and biological substances status: Secondary | ICD-10-CM | POA: Insufficient documentation

## 2018-01-31 DIAGNOSIS — Z8249 Family history of ischemic heart disease and other diseases of the circulatory system: Secondary | ICD-10-CM | POA: Insufficient documentation

## 2018-01-31 DIAGNOSIS — H409 Unspecified glaucoma: Secondary | ICD-10-CM | POA: Diagnosis not present

## 2018-01-31 DIAGNOSIS — Z87891 Personal history of nicotine dependence: Secondary | ICD-10-CM | POA: Diagnosis not present

## 2018-01-31 DIAGNOSIS — Z91041 Radiographic dye allergy status: Secondary | ICD-10-CM | POA: Insufficient documentation

## 2018-01-31 DIAGNOSIS — E785 Hyperlipidemia, unspecified: Secondary | ICD-10-CM | POA: Insufficient documentation

## 2018-01-31 DIAGNOSIS — K219 Gastro-esophageal reflux disease without esophagitis: Secondary | ICD-10-CM | POA: Insufficient documentation

## 2018-01-31 DIAGNOSIS — I252 Old myocardial infarction: Secondary | ICD-10-CM | POA: Diagnosis not present

## 2018-01-31 DIAGNOSIS — Z66 Do not resuscitate: Secondary | ICD-10-CM | POA: Diagnosis not present

## 2018-01-31 DIAGNOSIS — N400 Enlarged prostate without lower urinary tract symptoms: Secondary | ICD-10-CM | POA: Diagnosis not present

## 2018-01-31 DIAGNOSIS — I25110 Atherosclerotic heart disease of native coronary artery with unstable angina pectoris: Secondary | ICD-10-CM | POA: Diagnosis not present

## 2018-01-31 DIAGNOSIS — Z79899 Other long term (current) drug therapy: Secondary | ICD-10-CM | POA: Insufficient documentation

## 2018-01-31 DIAGNOSIS — I200 Unstable angina: Secondary | ICD-10-CM | POA: Diagnosis not present

## 2018-01-31 DIAGNOSIS — Z955 Presence of coronary angioplasty implant and graft: Secondary | ICD-10-CM | POA: Diagnosis not present

## 2018-01-31 DIAGNOSIS — I10 Essential (primary) hypertension: Secondary | ICD-10-CM | POA: Diagnosis not present

## 2018-01-31 DIAGNOSIS — I2584 Coronary atherosclerosis due to calcified coronary lesion: Secondary | ICD-10-CM | POA: Diagnosis not present

## 2018-01-31 DIAGNOSIS — Z7982 Long term (current) use of aspirin: Secondary | ICD-10-CM | POA: Insufficient documentation

## 2018-01-31 DIAGNOSIS — M1990 Unspecified osteoarthritis, unspecified site: Secondary | ICD-10-CM | POA: Diagnosis not present

## 2018-01-31 DIAGNOSIS — I2510 Atherosclerotic heart disease of native coronary artery without angina pectoris: Secondary | ICD-10-CM

## 2018-01-31 DIAGNOSIS — I110 Hypertensive heart disease with heart failure: Secondary | ICD-10-CM | POA: Diagnosis not present

## 2018-01-31 DIAGNOSIS — Z885 Allergy status to narcotic agent status: Secondary | ICD-10-CM | POA: Insufficient documentation

## 2018-01-31 DIAGNOSIS — I5032 Chronic diastolic (congestive) heart failure: Secondary | ICD-10-CM

## 2018-01-31 HISTORY — DX: Chronic diastolic (congestive) heart failure: I50.32

## 2018-01-31 LAB — COMPREHENSIVE METABOLIC PANEL
ALT: 14 U/L (ref 0–44)
AST: 20 U/L (ref 15–41)
Albumin: 3.2 g/dL — ABNORMAL LOW (ref 3.5–5.0)
Alkaline Phosphatase: 34 U/L — ABNORMAL LOW (ref 38–126)
Anion gap: 11 (ref 5–15)
BILIRUBIN TOTAL: 0.6 mg/dL (ref 0.3–1.2)
BUN: 14 mg/dL (ref 8–23)
CALCIUM: 8.6 mg/dL — AB (ref 8.9–10.3)
CO2: 22 mmol/L (ref 22–32)
Chloride: 103 mmol/L (ref 98–111)
Creatinine, Ser: 1.08 mg/dL (ref 0.61–1.24)
GFR calc Af Amer: >60 mL/min (ref >60)
GFR calc non Af Amer: >60 mL/min (ref >60)
Glucose, Bld: 132 mg/dL — ABNORMAL HIGH (ref 70–99)
Potassium: 4.1 mmol/L (ref 3.5–5.1)
Sodium: 136 mmol/L (ref 135–145)
TOTAL PROTEIN: 5.7 g/dL — AB (ref 6.5–8.1)

## 2018-01-31 LAB — CBC WITH DIFFERENTIAL/PLATELET
Abs Immature Granulocytes: 0.04 10*3/uL (ref 0.00–0.07)
BASOS PCT: 1 %
Basophils Absolute: 0.1 10*3/uL (ref 0.0–0.1)
EOS ABS: 0.2 10*3/uL (ref 0.0–0.5)
Eosinophils Relative: 3 %
HCT: 33 % — ABNORMAL LOW (ref 39.0–52.0)
Hemoglobin: 10.9 g/dL — ABNORMAL LOW (ref 13.0–17.0)
Immature Granulocytes: 1 %
Lymphocytes Relative: 37 %
Lymphs Abs: 2.1 10*3/uL (ref 0.7–4.0)
MCH: 32.1 pg (ref 26.0–34.0)
MCHC: 33 g/dL (ref 30.0–36.0)
MCV: 97.1 fL (ref 80.0–100.0)
Monocytes Absolute: 0.8 10*3/uL (ref 0.1–1.0)
Monocytes Relative: 13 %
Neutro Abs: 2.6 10*3/uL (ref 1.7–7.7)
Neutrophils Relative %: 45 %
PLATELETS: 208 10*3/uL (ref 150–400)
RBC: 3.4 MIL/uL — ABNORMAL LOW (ref 4.22–5.81)
RDW: 13.2 % (ref 11.5–15.5)
WBC: 5.8 10*3/uL (ref 4.0–10.5)
nRBC: 0 % (ref 0.0–0.2)

## 2018-01-31 LAB — TROPONIN I
Troponin I: <0.03 ng/mL (ref <0.03)
Troponin I: <0.03 ng/mL (ref <0.03)
Troponin I: <0.03 ng/mL (ref <0.03)

## 2018-01-31 LAB — HEPARIN LEVEL (UNFRACTIONATED): Heparin Unfractionated: 0.3 IU/mL (ref 0.30–0.70)

## 2018-01-31 MED ORDER — FINASTERIDE 5 MG PO TABS
5.0000 mg | ORAL_TABLET | Freq: Every day | ORAL | Status: DC
  Administered 2018-01-31 – 2018-02-02 (×3): 5 mg via ORAL
  Filled 2018-01-31 (×3): qty 1

## 2018-01-31 MED ORDER — ONDANSETRON HCL 4 MG/2ML IJ SOLN
4.0000 mg | Freq: Four times a day (QID) | INTRAMUSCULAR | Status: DC | PRN
  Administered 2018-01-31 – 2018-02-02 (×2): 4 mg via INTRAVENOUS
  Filled 2018-01-31 (×2): qty 2

## 2018-01-31 MED ORDER — HEPARIN SODIUM (PORCINE) 5000 UNIT/ML IJ SOLN: 5000.0000 [IU] | Freq: Three times a day (TID) | INTRAMUSCULAR | Status: DC

## 2018-01-31 MED ORDER — ASPIRIN 81 MG PO CHEW
81.0000 mg | CHEWABLE_TABLET | ORAL | Status: AC
  Administered 2018-02-01: 81 mg via ORAL
  Filled 2018-01-31: qty 1

## 2018-01-31 MED ORDER — ASPIRIN 300 MG RE SUPP: 300.0000 mg | RECTAL | Status: DC

## 2018-01-31 MED ORDER — ACETAMINOPHEN 325 MG PO TABS: 650.0000 mg | ORAL_TABLET | ORAL | Status: DC | PRN

## 2018-01-31 MED ORDER — METHYLPREDNISOLONE SODIUM SUCC 40 MG IJ SOLR
40.0000 mg | INTRAMUSCULAR | Status: AC
  Administered 2018-02-01 (×3): 40 mg via INTRAVENOUS
  Filled 2018-01-31 (×3): qty 1

## 2018-01-31 MED ORDER — BETHANECHOL CHLORIDE 25 MG PO TABS
50.0000 mg | ORAL_TABLET | Freq: Three times a day (TID) | ORAL | Status: DC
  Administered 2018-01-31 – 2018-02-02 (×6): 50 mg via ORAL
  Filled 2018-01-31 (×8): qty 2

## 2018-01-31 MED ORDER — ASPIRIN 81 MG PO CHEW: 324.0000 mg | CHEWABLE_TABLET | ORAL | Status: DC

## 2018-01-31 MED ORDER — RANOLAZINE ER 500 MG PO TB12
1000.0000 mg | ORAL_TABLET | Freq: Two times a day (BID) | ORAL | Status: DC
  Administered 2018-01-31 – 2018-02-02 (×5): 1000 mg via ORAL
  Filled 2018-01-31 (×6): qty 2

## 2018-01-31 MED ORDER — ASPIRIN EC 81 MG PO TBEC
81.0000 mg | DELAYED_RELEASE_TABLET | Freq: Every day | ORAL | Status: DC
  Administered 2018-01-31 – 2018-02-02 (×2): 81 mg via ORAL
  Filled 2018-01-31 (×3): qty 1

## 2018-01-31 MED ORDER — NITROGLYCERIN 0.4 MG SL SUBL
0.4000 mg | SUBLINGUAL_TABLET | SUBLINGUAL | Status: DC | PRN
  Administered 2018-01-31 – 2018-02-01 (×3): 0.4 mg via SUBLINGUAL
  Filled 2018-01-31 (×7): qty 1

## 2018-01-31 MED ORDER — PANTOPRAZOLE SODIUM 40 MG PO TBEC
40.0000 mg | DELAYED_RELEASE_TABLET | Freq: Every day | ORAL | Status: DC
  Administered 2018-01-31 – 2018-02-02 (×3): 40 mg via ORAL
  Filled 2018-01-31 (×3): qty 1

## 2018-01-31 MED ORDER — SODIUM CHLORIDE 0.9 % WEIGHT BASED INFUSION
1.0000 mL/kg/h | INTRAVENOUS | Status: DC
  Administered 2018-02-01: 1 mL/kg/h via INTRAVENOUS

## 2018-01-31 MED ORDER — CARVEDILOL 6.25 MG PO TABS
6.2500 mg | ORAL_TABLET | Freq: Two times a day (BID) | ORAL | Status: DC
  Administered 2018-01-31 – 2018-02-02 (×4): 6.25 mg via ORAL
  Filled 2018-01-31 (×4): qty 1

## 2018-01-31 MED ORDER — CLOPIDOGREL BISULFATE 75 MG PO TABS
75.0000 mg | ORAL_TABLET | Freq: Every day | ORAL | Status: DC
  Administered 2018-01-31 – 2018-02-02 (×3): 75 mg via ORAL
  Filled 2018-01-31 (×3): qty 1

## 2018-01-31 MED ORDER — HEPARIN (PORCINE) 25000 UT/250ML-% IV SOLN
1100.0000 [IU]/h | INTRAVENOUS | Status: DC
  Administered 2018-01-31: 1000 [IU]/h via INTRAVENOUS
  Administered 2018-01-31: 1100 [IU]/h via INTRAVENOUS
  Filled 2018-01-31: qty 250

## 2018-01-31 MED ORDER — SODIUM CHLORIDE 0.9 % IV SOLN: 250.0000 mL | INTRAVENOUS | Status: DC | PRN

## 2018-01-31 MED ORDER — SODIUM CHLORIDE 0.9 % WEIGHT BASED INFUSION
3.0000 mL/kg/h | INTRAVENOUS | Status: DC
  Administered 2018-02-01: 3 mL/kg/h via INTRAVENOUS

## 2018-01-31 MED ORDER — DIPHENHYDRAMINE HCL 50 MG/ML IJ SOLN
50.0000 mg | Freq: Once | INTRAMUSCULAR | Status: AC
  Filled 2018-01-31: qty 1

## 2018-01-31 MED ORDER — LINACLOTIDE 145 MCG PO CAPS
145.0000 ug | ORAL_CAPSULE | Freq: Every day | ORAL | Status: DC
  Administered 2018-02-01 – 2018-02-02 (×2): 145 ug via ORAL
  Filled 2018-01-31 (×2): qty 1

## 2018-01-31 MED ORDER — SODIUM CHLORIDE 0.9% FLUSH: 3.0000 mL | INTRAVENOUS | Status: DC | PRN

## 2018-01-31 MED ORDER — HYDROCORTISONE 1 % EX OINT
TOPICAL_OINTMENT | Freq: Two times a day (BID) | CUTANEOUS | Status: DC
  Administered 2018-01-31 – 2018-02-02 (×5): via TOPICAL
  Filled 2018-01-31: qty 28

## 2018-01-31 MED ORDER — DIPHENHYDRAMINE HCL 25 MG PO CAPS
50.0000 mg | ORAL_CAPSULE | Freq: Once | ORAL | Status: AC
  Administered 2018-02-01: 50 mg via ORAL
  Filled 2018-01-31: qty 2

## 2018-01-31 MED ORDER — ALPRAZOLAM 0.5 MG PO TABS
0.5000 mg | ORAL_TABLET | Freq: Two times a day (BID) | ORAL | Status: DC | PRN
  Administered 2018-01-31 – 2018-02-02 (×3): 0.5 mg via ORAL
  Filled 2018-01-31 (×3): qty 1

## 2018-01-31 MED ORDER — DOCUSATE SODIUM 100 MG PO CAPS
100.0000 mg | ORAL_CAPSULE | Freq: Every day | ORAL | Status: DC
  Administered 2018-01-31 – 2018-02-02 (×3): 100 mg via ORAL
  Filled 2018-01-31 (×3): qty 1

## 2018-01-31 MED ORDER — SODIUM CHLORIDE 0.9% FLUSH: 3.0000 mL | Freq: Two times a day (BID) | INTRAVENOUS | Status: DC

## 2018-01-31 MED ORDER — CLOPIDOGREL BISULFATE 75 MG PO TABS
225.0000 mg | ORAL_TABLET | Freq: Once | ORAL | Status: AC
  Administered 2018-01-31: 225 mg via ORAL
  Filled 2018-01-31: qty 3

## 2018-01-31 MED ORDER — ISOSORBIDE MONONITRATE ER 60 MG PO TB24
60.0000 mg | ORAL_TABLET | Freq: Every day | ORAL | Status: DC
  Administered 2018-01-31 – 2018-02-02 (×3): 60 mg via ORAL
  Filled 2018-01-31 (×3): qty 1

## 2018-01-31 MED ORDER — LATANOPROST 0.005 % OP SOLN
1.0000 [drp] | Freq: Every day | OPHTHALMIC | Status: DC
  Administered 2018-01-31 – 2018-02-02 (×2): 1 [drp] via OPHTHALMIC
  Filled 2018-01-31 (×2): qty 2.5

## 2018-01-31 MED ORDER — ALFUZOSIN HCL ER 10 MG PO TB24
10.0000 mg | ORAL_TABLET | Freq: Every day | ORAL | Status: DC
  Administered 2018-01-31 – 2018-02-01 (×2): 10 mg via ORAL
  Filled 2018-01-31 (×6): qty 1

## 2018-01-31 MED ORDER — FUROSEMIDE 20 MG PO TABS
20.0000 mg | ORAL_TABLET | Freq: Every day | ORAL | Status: DC
  Administered 2018-01-31 – 2018-02-02 (×3): 20 mg via ORAL
  Filled 2018-01-31 (×3): qty 1

## 2018-01-31 NOTE — Consult Note (Addendum)
Cardiology Consultation:   Patient ID: Tomoya Ringwald MRN: [id number]; DOB: [birth year]  Admit date: 01/31/2018 Date of Consult: 01/31/2018  Primary Care Provider: Shelbie Ammons, MD Primary Cardiologist: Garwin Brothers, MD  Primary Electrophysiologist:  None    Patient Profile:   Garrison Michie is a 78 y.o. male with a hx of CAD s/p CABG with graft stenosis and prior PCIs in SVG to PDA and chronically occluded VG to diag and VG to OM and patent LIMA,  though on cath with medical therapy, HLD, HTN, hx MI, PUD and chronic foley catheter who is being seen today for the evaluation of chest pain at the request of Dr. Rinaldo Ratel.  History of Present Illness:   Mr. Biehn CAD and CABG in 2008 and graft stenosis since that time in chronically occluded VG to diag and VG to OM, VG to PDA with previous stents placed that were patent on last cath 08/2017.   His LIMA to LAD patent.  Medical therapy was advised, if no improvement possible PCI to native RCA,. other hx of HTN, HLD.   Was seen 01/27/18 by Dr. Tomie China and had complained of chest pain at night.  At times relief with sl NTG.  No pain with his 30 min walk daily.  His imdur was increased.  He was referred to cardiac rehab as well.    Pt now admitted with increased episodes of the night time chest pain.  NTG with relief and then return of pain.  He was taking up to 6 NTG at night.  No associated symptoms except cold sweat at times.  A deep ache.  Pain does occur during the day with his 30 min walk it wil occur and he walks through the pain with slower pace and with shopping for groceries he will develop chest pain that is worse than his morning walk pain. He has chronic lower ext edema that has increased as well.  No SOB.  In [facility] he was given 324 mg ASA and IV heparin and pt admitted at [facility] with medicine possible need for PCI.   On last cath if medical therapy failed then PCI with atherectomy should be considered.    He has had indwelling  foley for a year and was to see urology on Wed.  Probable supra pubic cath if continues with urinary retention.  EKG:  The EKG was personally reviewed and demonstrates:  SR non specific ST depression but no change from 01/27/18. Telemetry:  Telemetry was personally reviewed and demonstrates:  SR rare PVC.  Troponin <0.03 Na 136, K+ 4.1, Cr 1.08, hgb 10.9 WBC 5.8, plts 208   Currently pain free.  He does complain of ulcer Rt ankle.  Has been causing pain lately.    Past Medical History:  Diagnosis Date  . Anxiety   . Arthritis   . BPH (benign prostatic hyperplasia)   . CAD (coronary artery disease)   . Cataracts, bilateral   . Enlarged prostate   . GERD (gastroesophageal reflux disease)   . Glaucoma   . Hyperlipidemia   . Hypertension   . Non-ST elevation (NSTEMI) myocardial infarction (HCC) 08/21/2017  . Pneumonia   . PUD (peptic ulcer disease)   . Rotator cuff tear     Past Surgical History:  Procedure Laterality Date  . APPENDECTOMY    . CORONARY ANGIOPLASTY WITH STENT PLACEMENT  11/2014   PCI of SVG to PDA x 2 with Xience DES to proximal stenosis, Ultra 5.0 BMS to distal  thrombotic lesion  . CORONARY ARTERY BYPASS GRAFT  2008  . EYE SURGERY    . LEFT HEART CATH AND CORS/GRAFTS ANGIOGRAPHY N/A 08/21/2017   Procedure: LEFT HEART CATH AND CORS/GRAFTS ANGIOGRAPHY;  Surgeon: Yvonne Kendall, MD;  Location: [facility] INVASIVE CV LAB;  Service: Cardiovascular;  Laterality: N/A;  . LEG SURGERY    . TONSILLECTOMY       Home Medications:  Prior to Admission medications   Medication Sig Start Date End Date Taking? Authorizing Provider  alfuzosin (UROXATRAL) 10 MG 24 hr tablet Take 1 tablet by mouth daily.    [provider]  ALPRAZolam Prudy Feeler) 0.5 MG tablet Take 0.5 mg by mouth 4 (four) times daily as needed for anxiety or sleep.  05/10/14   [provider]  aspirin EC 81 MG tablet Take 81 mg by mouth daily.    [provider]  bethanechol (URECHOLINE) 50 MG  tablet Take 50 mg by mouth 3 (three) times daily.    [provider]  carvedilol (COREG) 6.25 MG tablet Take 1 tablet by mouth 2 (two) times daily. 09/01/16   [provider]  Cholecalciferol (VITAMIN D-1000 MAX ST) 1000 units tablet Take 1 capsule by mouth daily.    [provider]  Coenzyme Q10 100 MG capsule Take 1 capsule by mouth daily.    [provider]  finasteride (PROSCAR) 5 MG tablet Take 5 mg by mouth daily.     [provider]  folic acid (FOLVITE) 1 MG tablet Take 1 tablet (1 mg total) by mouth daily. 08/24/17   Jackie Plum, MD  furosemide (LASIX) 20 MG tablet Take 20 mg by mouth daily as needed for fluid.  07/30/17   [provider]  nitroGLYCERIN (NITROSTAT) 0.4 MG SL tablet Place 0.4 mg under the tongue every 5 (five) minutes as needed for chest pain.  03/10/16   [provider]  ondansetron (ZOFRAN) 8 MG tablet Take 8 mg by mouth every 8 (eight) hours as needed for nausea or vomiting.     [provider]  pantoprazole (PROTONIX) 40 MG tablet Take 40 mg by mouth daily.     [provider]  ranolazine (RANEXA) 1000 MG SR tablet Take 1,000 mg by mouth 2 (two) times daily.  02/25/16   [provider]  TRAVATAN Z 0.004 % SOLN ophthalmic solution Place 1 drop into both eyes at bedtime.  06/24/17   [provider]  vitamin C (ASCORBIC ACID) 500 MG tablet Take 1,000 mg by mouth 2 (two) times daily.     [provider]    Inpatient Medications: Scheduled Meds: . alfuzosin  10 mg Oral Daily  . aspirin EC  81 mg Oral Daily  . bethanechol  50 mg Oral TID  . carvedilol  6.25 mg Oral BID WC  . finasteride  5 mg Oral Daily  . furosemide  20 mg Oral Daily  . latanoprost  1 drop Both Eyes QHS  . pantoprazole  40 mg Oral Daily  . ranolazine  1,000 mg Oral BID   Continuous Infusions: . heparin 1,000 Units/hr (01/31/18 0436)   PRN Meds: acetaminophen, ALPRAZolam, nitroGLYCERIN,  ondansetron (ZOFRAN) IV  Allergies:    Allergies  Allergen Reactions  . Codeine Other (See Comments)    unknown  . Lisinopril Cough  . Contrast Media [Iodinated Diagnostic Agents] Hives and Rash    Social History:   Social History   Socioeconomic History  . Marital status: Married    Spouse  name: Not on file  . Number of children: Not on file  . Years of education: Not on file  . Highest education level: Not on file  Occupational History  . Occupation: Retired  Engineer, productionocial Needs  . Financial resource strain: Not on file  . Food insecurity:    Worry: Not on file    Inability: Not on file  . Transportation needs:    Medical: Not on file    Non-medical: Not on file  Tobacco Use  . Smoking status: Former Games developermoker  . Smokeless tobacco: Never Used  Substance and Sexual Activity  . Alcohol use: Yes    Alcohol/week: 21.0 standard drinks    Types: 21 Cans of beer per week  . Drug use: Yes    Types: Marijuana  . Sexual activity: Not on file  Lifestyle  . Physical activity:    Days per week: Not on file    Minutes per session: Not on file  . Stress: Not on file  Relationships  . Social connections:    Talks on phone: Not on file    Gets together: Not on file    Attends religious service: Not on file    Active member of club or organization: Not on file    Attends meetings of clubs or organizations: Not on file    Relationship status: Not on file  . Intimate partner violence:    Fear of current or ex partner: Not on file    Emotionally abused: Not on file    Physically abused: Not on file    Forced sexual activity: Not on file  Other Topics Concern  . Not on file  Social History Narrative   [place]    Family History:    Family History  Problem Relation Age of Onset  . Diabetes Mother   . Heart attack Brother      ROS:  Please see the history of present illness.  General:no colds or fevers, no weight changes Skin:no rashes or  ulcers HEENT:no blurred vision, no congestion CV:see HPI PUL:see HPI GI:no diarrhea constipation or melena, no indigestion GU:no hematuria, no dysuria MS:no joint pain, no claudication Neuro:no syncope, no lightheadedness Endo:no diabetes, no thyroid disease  All other ROS reviewed and negative.     Physical Exam/Data:   Vitals:   01/31/18 0224  BP: (!) 145/68  Pulse: 67  Temp: 97.9 F (36.6 C)  TempSrc: Oral  SpO2: 100%  Weight: 89 kg  Height: 5\' 10"  (1.778 m)   No intake or output data in the 24 hours ending 01/31/18 0817 Last 3 Weights 01/31/2018 01/27/2018 11/30/2017  Weight (lbs) 196 lb 1.6 oz 196 lb 195 lb  Weight (kg) 88.95 kg 88.905 kg 88.451 kg     Body mass index is 28.14 kg/m.  General:  Well nourished, well developed, in no acute distress HEENT: normal Lymph: no adenopathy Neck: no JVD Endocrine:  No thryomegaly Vascular: No carotid bruits; pedal pulses 1+ bilaterally Cardiac:  normal S1, S2; RRR; no murmur gallup rub or click Lungs:  clear to auscultation bilaterally, occ wheezing, no rhonchi or rales  Abd: soft, nontender, no hepatomegaly  Ext: 1+ edema of ankles and 2+ of feet.    Dried ulcer Rt ankle though redness on edges. Musculoskeletal:  No deformities, BUE and BLE strength normal and equal Skin: warm and dry  Neuro:  CNs 2-12 intact, no focal abnormalities noted Psych:  Normal affect  Relevant CV Studies:  Cardiac cath 08/2017 Conclusions: 1. Severe native coronary artery disease, as detailed below. 2. Widely patent LIMA to LAD. 3. Patent SVG to RPDA with patent stents and mild to moderate, nonobstructive disease. 4. Chronically occluded SVG to diagonal and SVG to OM. 5. Mildly elevated left ventricular filling pressure. 6. Focal basal inferior hypokinesis with otherwise preserved left ventricular systolic function.  Recommendations: 1. Based on outside cath reports, I suspect that progression of native RCA disease may be to blame for the  patient's chest pain and mild troponin elevation.  I favor medical therapy given heavy calcification of the artery.  If he continues to have recurrent angina, PCI with atherectomy will need to be considered, as the RCA continuation and PL branches are not supplied by the SVG to RPDA.  I will start isosorbide mononitrate, which can be uptitrated as blood pressure allows. 2. Medical therapy for NSTEMI with at least 12 months of dual antiplatelet therapy.  Will start heparin infusion and complete 24 to 48 hours of IV heparin. 3. Hold diuresis today.  Consider restarting gentle diuresis again tomorrow if renal function is stable.   Recommend uninterrupted dual antiplatelet therapy with Aspirin 81mg  daily and Clopidogrel 75mg  daily for a minimum of 12 months (ACS - Class I recommendation).  Diagnostic  Dominance: Right    Echo 08/22/17  Study Conclusions  - Left ventricle: The cavity size was mildly dilated. Wall   thickness was normal. Systolic function was normal. The estimated   ejection fraction was in the range of 55% to 60%. Wall motion was   normal; there were no regional wall motion abnormalities. Doppler   parameters are consistent with abnormal left ventricular   relaxation (grade 1 diastolic dysfunction). - Aortic valve: There was mild regurgitation. - Mitral valve: There was mild regurgitation. - Left atrium: The atrium was mildly dilated.  Impressions:  - Definity used; normal LV systolic function; mild diastolic   dysfunction; mild LVE; mild AI and MR; mild LAE.    Laboratory Data:  Chemistry Recent Labs  Lab 01/31/18 0346  NA 136  K 4.1  CL 103  CO2 22  GLUCOSE 132*  BUN 14  CREATININE 1.08  CALCIUM 8.6*  GFRNONAA >60  GFRAA >60  ANIONGAP 11    Recent Labs  Lab 01/31/18 0346  PROT 5.7*  ALBUMIN 3.2*  AST 20  ALT 14  ALKPHOS 34*  BILITOT 0.6   Hematology Recent Labs  Lab 01/31/18 0346  WBC 5.8  RBC 3.40*  HGB 10.9*  HCT 33.0*  MCV 97.1    MCH 32.1  MCHC 33.0  RDW 13.2  PLT 208   Cardiac Enzymes Recent Labs  Lab 01/31/18 0346  TROPONINI <0.03   No results for input(s): TROPIPOC in the last 168 hours.  BNPNo results for input(s): BNP, PROBNP in the last 168 hours.  DDimer No results for input(s): DDIMER in the last 168 hours.  Radiology/Studies:  No results found.  Assessment and Plan:   1. Unstable angina on Ranexa at 1000 BID - imdur had been ordered but not on currently - will add back,  he is on BB --he was discharged on imdur in August and has been on med lists without stopping.  Dr. Elease HashimotoNahser to see. Also DAPT was recommended in August with NSTEMI and discharged with this, now on aSA alone perhaps due to prostate surgery.  Will resume.  2. CAD with hx of CABG in 2008  3. CAD in bypassed VG, with 2 VGs  occluded and stents to VG to PDA on last cath Dr. Okey Dupre believed this was progression of native RCA disease and if continued angina PCI with arthrectomy should be considered as the RCA continuation and PL branches are not supplied by the SVG to RPDA 4. HTN controled on current meds 5. HLD on statin 6. Lower ext edema on lasix 20 mg  7. Chronic foley cath -had prostate surgery in Sept.  Soon to try trial without foley if fails then suprapubic catheter. 8. Ulcer Rt ankle with pain beginning.  Will ask wound care to see 9. ETOH use  10. GERD      For questions or updates, please contact [facility] Please consult [email or web address] for contact info under     Signed, Nada Boozer, NP  01/31/2018 8:17 AM   Attending Note:   The patient was seen and examined.  Agree with assessment and plan as noted above.  Changes made to the above note as needed.  Patient seen and independently examined with Nada Boozer, NP .   We discussed all aspects of the encounter. I agree with the assessment and plan as stated above.  1.  Coronary artery disease: The patient has a known history of coronary artery disease.  The patient has  severe right coronary artery disease.  Very the vessel is very calcified.  The plan was for medical therapy and to consider PCI if he has current angina.  The patient is continued to have recurrent angina.  Schedule him for heart cath/PCI tomorrow.  We have discussed the risks, benefits, options. He understands and agrees to proceed.     I have spent a total of 40 minutes with patient reviewing hospital  notes , telemetry, EKGs, labs and examining patient as well as establishing an assessment and plan that was discussed with the patient. > 50% of time was spent in direct patient care.    Vesta Mixer, Montez Hageman., MD, [facility] 01/31/2018, 10:44 AM [address]  [address] Office [phone] Pager [phone]

## 2018-01-31 NOTE — Progress Notes (Signed)
ANTICOAGULATION CONSULT NOTE - Initial Consult  Pharmacy Consult for Heparin Indication: chest pain/ACS  Allergies  Allergen Reactions  . Codeine Other (See Comments)    unknown  . Lisinopril Cough  . Contrast Media [Iodinated Diagnostic Agents] Hives and Rash    Patient Measurements: Height: 5\' 10"  (177.8 cm) Weight: 196 lb 1.6 oz (89 kg) IBW/kg (Calculated) : 73  Vital Signs: Temp: 97.9 F (36.6 C) (01/19 0224) Temp Source: Oral (01/19 0224) BP: 145/68 (01/19 0224) Pulse Rate: 67 (01/19 0224)  Labs (at [facility]): WBC  5.2 Hgb  12.3 Hct  35.9 Plt  223  SCr  1.0 No results for input(s): HGB, HCT, PLT, APTT, LABPROT, INR, HEPARINUNFRC, HEPRLOWMOCWT, CREATININE, CKTOTAL, CKMB, TROPONINI in the last 72 hours.  CrCl cannot be calculated (Patient's most recent lab result is older than the maximum 21 days allowed.).   Medical History: Past Medical History:  Diagnosis Date  . Anxiety   . Arthritis   . BPH (benign prostatic hyperplasia)   . CAD (coronary artery disease)   . Cataracts, bilateral   . Enlarged prostate   . GERD (gastroesophageal reflux disease)   . Glaucoma   . Hyperlipidemia   . Hypertension   . Non-ST elevation (NSTEMI) myocardial infarction (HCC) 08/21/2017  . Pneumonia   . PUD (peptic ulcer disease)   . Rotator cuff tear     Medications:  No current facility-administered medications on file prior to encounter.    Current Outpatient Medications on File Prior to Encounter  Medication Sig Dispense Refill  . alfuzosin (UROXATRAL) 10 MG 24 hr tablet Take 1 tablet by mouth daily.    Marland Kitchen ALPRAZolam (XANAX) 0.5 MG tablet Take 0.5 mg by mouth 4 (four) times daily as needed for anxiety or sleep.     Marland Kitchen aspirin EC 81 MG tablet Take 81 mg by mouth daily.    . bethanechol (URECHOLINE) 50 MG tablet Take 50 mg by mouth 3 (three) times daily.    . carvedilol (COREG) 6.25 MG tablet Take 1 tablet by mouth 2 (two) times daily.    . Cholecalciferol (VITAMIN  D-1000 MAX ST) 1000 units tablet Take 1 capsule by mouth daily.    . Coenzyme Q10 100 MG capsule Take 1 capsule by mouth daily.    . finasteride (PROSCAR) 5 MG tablet Take 5 mg by mouth daily.     . folic acid (FOLVITE) 1 MG tablet Take 1 tablet (1 mg total) by mouth daily. 30 tablet 0  . furosemide (LASIX) 20 MG tablet Take 20 mg by mouth daily as needed for fluid.     . nitroGLYCERIN (NITROSTAT) 0.4 MG SL tablet Place 0.4 mg under the tongue every 5 (five) minutes as needed for chest pain.     Marland Kitchen ondansetron (ZOFRAN) 8 MG tablet Take 8 mg by mouth every 8 (eight) hours as needed for nausea or vomiting.     . pantoprazole (PROTONIX) 40 MG tablet Take 40 mg by mouth daily.     . ranolazine (RANEXA) 1000 MG SR tablet Take 1,000 mg by mouth 2 (two) times daily.     . TRAVATAN Z 0.004 % SOLN ophthalmic solution Place 1 drop into both eyes at bedtime.   6  . vitamin C (ASCORBIC ACID) 500 MG tablet Take 1,000 mg by mouth 2 (two) times daily.        Assessment: 78 y.o. male with chest pain for heparin.  Heparin 4000 units IV bolus, 1000 units/hr started at 11  pm  Goal of Therapy:  Heparin level 0.3-0.7 units/ml Monitor platelets by anticoagulation protocol: Yes   Plan:  Continue Heparin at current rate  Follow-up am labs.  Eddie Candle 01/31/2018,4:09 AM

## 2018-01-31 NOTE — Progress Notes (Signed)
ANTICOAGULATION CONSULT NOTE - Follow-Up Consult  Pharmacy Consult for Heparin Indication: chest pain/ACS  Allergies  Allergen Reactions  . Codeine Other (See Comments)    unknown  . Lisinopril Cough  . Contrast Media [Iodinated Diagnostic Agents] Hives and Rash    Patient Measurements: Height: 5\' 10"  (177.8 cm) Weight: 196 lb 1.6 oz (89 kg) IBW/kg (Calculated) : 73  Vital Signs: Temp: 97.9 F (36.6 C) (01/19 0224) Temp Source: Oral (01/19 0224) BP: 149/67 (01/19 1010) Pulse Rate: 67 (01/19 0224)  Labs (at [facility]): WBC  5.2 Hgb  12.3 Hct  35.9 Plt  223  SCr  1.0 Recent Labs    01/31/18 0346 01/31/18 0922  HGB 10.9*  --   HCT 33.0*  --   PLT 208  --   HEPARINUNFRC  --  0.30  CREATININE 1.08  --   TROPONINI <0.03  --     Estimated Creatinine Clearance: 63.3 mL/min (by C-G formula based on SCr of 1.08 mg/dL).   Medical History: Past Medical History:  Diagnosis Date  . Anxiety   . Arthritis   . BPH (benign prostatic hyperplasia)   . CAD (coronary artery disease)   . Cataracts, bilateral   . Enlarged prostate   . GERD (gastroesophageal reflux disease)   . Glaucoma   . Hyperlipidemia   . Hypertension   . Non-ST elevation (NSTEMI) myocardial infarction (HCC) 08/21/2017  . Pneumonia   . PUD (peptic ulcer disease)   . Rotator cuff tear     Medications:  No current facility-administered medications on file prior to encounter.    Current Outpatient Medications on File Prior to Encounter  Medication Sig Dispense Refill  . alfuzosin (UROXATRAL) 10 MG 24 hr tablet Take 1 tablet by mouth daily.    Marland Kitchen ALPRAZolam (XANAX) 0.5 MG tablet Take 0.5 mg by mouth 2 (two) times daily as needed for anxiety or sleep.     Marland Kitchen aspirin EC 81 MG tablet Take 81 mg by mouth daily.    . bethanechol (URECHOLINE) 50 MG tablet Take 50 mg by mouth 3 (three) times daily.    . carvedilol (COREG) 6.25 MG tablet Take 1 tablet by mouth 2 (two) times daily.    Marland Kitchen docusate sodium  (COLACE) 100 MG capsule Take 100 mg by mouth daily.    . furosemide (LASIX) 20 MG tablet Take 20 mg by mouth daily as needed for fluid.     Marland Kitchen linaclotide (LINZESS) 145 MCG CAPS capsule Take 145 mcg by mouth daily before breakfast.    . Melatonin 5 MG TABS Take 40 mg by mouth at bedtime.    . nitroGLYCERIN (NITROSTAT) 0.4 MG SL tablet Place 0.4 mg under the tongue every 5 (five) minutes as needed for chest pain.     Marland Kitchen ondansetron (ZOFRAN) 8 MG tablet Take 8 mg by mouth 2 (two) times daily.     . pantoprazole (PROTONIX) 40 MG tablet Take 40 mg by mouth daily.     . ranolazine (RANEXA) 1000 MG SR tablet Take 1,000 mg by mouth 2 (two) times daily.     . TRAVATAN Z 0.004 % SOLN ophthalmic solution Place 1 drop into both eyes 2 (two) times daily.   6  . dorzolamide-timolol (COSOPT) 22.3-6.8 MG/ML ophthalmic solution Place 1 drop into both eyes 2 (two) times daily.    . folic acid (FOLVITE) 1 MG tablet Take 1 tablet (1 mg total) by mouth daily. (Patient not taking: Reported on 01/31/2018) 30 tablet 0  . [  DISCONTINUED] Cholecalciferol (VITAMIN D-1000 MAX ST) 1000 units tablet Take 1 capsule by mouth daily.    . [DISCONTINUED] Coenzyme Q10 100 MG capsule Take 1 capsule by mouth daily.    . [DISCONTINUED] finasteride (PROSCAR) 5 MG tablet Take 5 mg by mouth daily.     . [DISCONTINUED] vitamin C (ASCORBIC ACID) 500 MG tablet Take 1,000 mg by mouth 2 (two) times daily.        Assessment: 1 yoM with known CAD admitted with CP on IV heparin per pharmacy. Initial heparin level 0.30, H/H low but at baseline for pt, pltc wnl. Troponins negative.  Goal of Therapy:  Heparin level 0.3-0.7 units/ml Monitor platelets by anticoagulation protocol: Yes   Plan:  -Increase heparin to 1100 units/hr -Recheck confirmatory heparin level with morning labs  Fredonia Highland, PharmD, BCPS Clinical Pharmacist [phone] Please check AMION for all [facility] Pharmacy numbers 01/31/2018

## 2018-01-31 NOTE — Progress Notes (Signed)
Patient seen and examined.  He was returning from the restroom.  Patient has significant CAD in bypassed VG with 2 venous grafts occluded and stents to the VG to the PDA.  He was sitting resting on the side of the bed.  No new symptoms.  Patient seen by cardiology.  He will be scheduled for PCI/ Heart cath tomorrow.  Vital signs stable and no recurrence of chest pain.

## 2018-01-31 NOTE — Consult Note (Signed)
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  Reports having been bitten by a tick approximately 10 years ago where the area blistered and eventually healed.  Approximately 6 months ago the same area began to crust and flake. Wound type: partial thickness wound, chronic, nonhealing. Pressure Injury POA: NA Measurement:3cm x 4cm with no depth.  Elevation of approximately 0.1cm of dry, crusted skin Wound bed:As described abvoe Drainage (amount, consistency, odor) None Periwound: Unremarkable Dressing procedure/placement/frequency: I will recommend a conservative approach of twice daily hydrocortisone cream for 3 weeks applied after washing with soap and water and drying (to loosen crust). Suggest that patient follow up with a dermatologist or his PCP for thsi area post discharge if not resolved.  If you agree, please order/arrange.  WOC nursing team will not follow, but will remain available to this patient, the nursing and medical teams.  Please re-consult if needed. Thanks, Ladona Mow, MSN, RN, GNP, [place]  Pager# [phone]

## 2018-01-31 NOTE — Progress Notes (Signed)
Pt complained of nausea. Stated he has frequent nausea at home and takes Zofran prn. I specifically asked if he was having any chest pain/discomfort or any other symptoms relating to his heart. He denied any issues with heart/chest and stated this is his chronic nausea problem. Will closely monitor

## 2018-01-31 NOTE — H&P (Signed)
History and Physical    Austin Malone ZOX:[id number] DOB: [birth year] DOA: 01/31/2018  PCP: Shelbie AmmonsHaque, Imran P, MD   Patient coming from: Home  Chief Complaint: Chest pain   HPI: Austin Malone is a 78 y.o. male with medical history significant for coronary artery disease status post CABG, hypertension, BPH, and chronic Foley catheter, now presenting to the emergency department for evaluation of chest pain.  Patient has had increasingly frequent episodes of chest pain, usually at night, symptoms resolved with nitroglycerin, but have been returning fairly quickly.  Over the past several days, he has been using nitroglycerin roughly 5 times per night.  Interestingly, he reports very little pain at all with activity during the day which includes walking up stairs.  At night however, he experiences pain described as pressure in character, localized to the central chest, and relieved with nitroglycerin.  Reports chronic bilateral lower extremity edema that may be slightly worse than usual, but denies any calf tenderness.  Denies any significant dyspnea or cough, denies nausea or diaphoresis, and denies fevers or chills.  Austin Malone: Upon arrival to the ED, patient is found to be afebrile, saturating adequately on room air, and with vitals otherwise normal.  EKG features sinus rhythm with nonspecific ST-T abnormality.  Chest x-ray is negative for acute disease.  Chemistry panel features a slight hyponatremia and CBC notable for a very mild normocytic anemia.  Troponin was undetectable and proBNP slightly elevated.  Patient was given 324 mg of aspirin and started on heparin infusion.  ED physician discussed the case with [facility].  On[facility] was consulted by the ED physician and recommended medical admission.  Review of Systems:  All other systems reviewed and apart from HPI, are negative.  Past  Medical History:  Diagnosis Date  . Anxiety   . Arthritis   . BPH (benign prostatic hyperplasia)   . CAD (coronary artery disease)   . Cataracts, bilateral   . Enlarged prostate   . GERD (gastroesophageal reflux disease)   . Glaucoma   . Hyperlipidemia   . Hypertension   . Non-ST elevation (NSTEMI) myocardial infarction (HCC) 08/21/2017  . Pneumonia   . PUD (peptic ulcer disease)   . Rotator cuff tear     Past Surgical History:  Procedure Laterality Date  . APPENDECTOMY    . CORONARY ANGIOPLASTY WITH STENT PLACEMENT  11/2014   PCI of SVG to PDA x 2 with Xience DES to proximal stenosis, Ultra 5.0 BMS to distal thrombotic lesion  . CORONARY ARTERY BYPASS GRAFT  2008  . EYE SURGERY    . LEFT HEART CATH AND CORS/GRAFTS ANGIOGRAPHY N/A 08/21/2017   Procedure: LEFT HEART CATH AND CORS/GRAFTS ANGIOGRAPHY;  Surgeon: Yvonne KendallEnd, Christopher, MD;  Location: [facility] INVASIVE CV LAB;  Service: Cardiovascular;  Laterality: N/A;  . LEG SURGERY    . TONSILLECTOMY       reports that he has quit smoking. He has never used smokeless tobacco. He reports current alcohol use of about 21.0 standard drinks of alcohol per week. He reports current drug use. Drug: Marijuana.  Allergies  Allergen Reactions  . Codeine Other (See Comments)    unknown  . Lisinopril Cough  . Contrast Media [Iodinated Diagnostic Agents] Hives and Rash    Family History  Problem Relation Age of Onset  . Diabetes Mother   . Heart attack Brother      Prior to Admission  medications   Medication Sig Start Date End Date Taking? Authorizing Provider  alfuzosin (UROXATRAL) 10 MG 24 hr tablet Take 1 tablet by mouth daily.    [provider]  ALPRAZolam Prudy Feeler(XANAX) 0.5 MG tablet Take 0.5 mg by mouth 4 (four) times daily as needed for anxiety or sleep.  05/10/14   [provider]  aspirin EC 81 MG tablet Take 81 mg by mouth daily.    [provider]  bethanechol (URECHOLINE) 50 MG tablet Take 50 mg by mouth 3 (three)  times daily.    [provider]  carvedilol (COREG) 6.25 MG tablet Take 1 tablet by mouth 2 (two) times daily. 09/01/16   [provider]  Cholecalciferol (VITAMIN D-1000 MAX ST) 1000 units tablet Take 1 capsule by mouth daily.    [provider]  Coenzyme Q10 100 MG capsule Take 1 capsule by mouth daily.    [provider]  finasteride (PROSCAR) 5 MG tablet Take 5 mg by mouth daily.     [provider]  folic acid (FOLVITE) 1 MG tablet Take 1 tablet (1 mg total) by mouth daily. 08/24/17   Jackie Plumsei-Bonsu, George, MD  furosemide (LASIX) 20 MG tablet Take 20 mg by mouth daily as needed for fluid.  07/30/17   [provider]  nitroGLYCERIN (NITROSTAT) 0.4 MG SL tablet Place 0.4 mg under the tongue every 5 (five) minutes as needed for chest pain.  03/10/16   [provider]  ondansetron (ZOFRAN) 8 MG tablet Take 8 mg by mouth every 8 (eight) hours as needed for nausea or vomiting.     [provider]  pantoprazole (PROTONIX) 40 MG tablet Take 40 mg by mouth daily.     [provider]  ranolazine (RANEXA) 1000 MG SR tablet Take 1,000 mg by mouth 2 (two) times daily.  02/25/16   [provider]  TRAVATAN Z 0.004 % SOLN ophthalmic solution Place 1 drop into both eyes at bedtime.  06/24/17   [provider]  vitamin C (ASCORBIC ACID) 500 MG tablet Take 1,000 mg by mouth 2 (two) times daily.     [provider]    Physical Exam: Vitals:   01/31/18 0224  BP: (!) 145/68  Pulse: 67  Temp: 97.9 F (36.6 C)  TempSrc: Oral  SpO2: 100%  Weight: 89 kg  Height: 5\' 10"  (1.778 m)    Constitutional: NAD, calm  Eyes: PERTLA, lids and conjunctivae normal ENMT: Mucous membranes are moist. Posterior pharynx clear of any exudate or lesions.   Neck: normal, supple, no masses, no thyromegaly Respiratory: clear to auscultation bilaterally, no wheezing, no crackles. Normal respiratory effort.   Cardiovascular: S1 &  S2 heard, regular rate and rhythm. Pretibial pitting edema. Abdomen: No distension, no tenderness, soft. Bowel sounds active.  Musculoskeletal: no clubbing / cyanosis. No joint deformity upper and lower extremities.   Skin: no significant rashes, lesions, ulcers. Warm, dry, well-perfused. Neurologic: no facial asymmetry. Sensation intact. Moving all extremities.  Psychiatric: Alert and oriented to person, place, and situation. Pleasant and cooperative.    Labs on Admission: I have personally reviewed following labs and imaging studies  CBC: No results for input(s): WBC, NEUTROABS, HGB, HCT, MCV, PLT in the last 168 hours. Basic Metabolic Panel: No results for input(s): NA, K, CL, CO2, GLUCOSE, BUN, CREATININE, CALCIUM, MG, PHOS in the last 168 hours. GFR: CrCl cannot be calculated (Patient's most recent lab result is older than the maximum 21 days allowed.). Liver Function  Tests: No results for input(s): AST, ALT, ALKPHOS, BILITOT, PROT, ALBUMIN in the last 168 hours. No results for input(s): LIPASE, AMYLASE in the last 168 hours. No results for input(s): AMMONIA in the last 168 hours. Coagulation Profile: No results for input(s): INR, PROTIME in the last 168 hours. Cardiac Enzymes: No results for input(s): CKTOTAL, CKMB, CKMBINDEX, TROPONINI in the last 168 hours. BNP (last 3 results) No results for input(s): PROBNP in the last 8760 hours. HbA1C: No results for input(s): HGBA1C in the last 72 hours. CBG: No results for input(s): GLUCAP in the last 168 hours. Lipid Profile: No results for input(s): CHOL, HDL, LDLCALC, TRIG, CHOLHDL, LDLDIRECT in the last 72 hours. Thyroid Function Tests: No results for input(s): TSH, T4TOTAL, FREET4, T3FREE, THYROIDAB in the last 72 hours. Anemia Panel: No results for input(s): VITAMINB12, FOLATE, FERRITIN, TIBC, IRON, RETICCTPCT in the last 72 hours. Urine analysis: No results found for: COLORURINE, APPEARANCEUR, LABSPEC, PHURINE, GLUCOSEU,  HGBUR, BILIRUBINUR, KETONESUR, PROTEINUR, UROBILINOGEN, NITRITE, LEUKOCYTESUR Sepsis Labs: @LABRCNTIP (procalcitonin:4,lacticidven:4) )No results found for this or any previous visit (from the past 240 hour(s)).   Radiological Exams on Admission: No results found.  EKG: Independently reviewed. Sinus rhythm, non-specific ST-T abnormality.   Assessment/Plan   1. Unstable angina; CAD  - Presents with increasingly frequent chest pain, resolves with NTG, though unusual in that he has been active (even walking up stairs) without pain but usually experiences pain at night  - Reports using NTG 5x per night recently; pain resolves with NTG but returns  - He has known CAD, is s/p CABG - Cath in August 2019 with chronic SVG to diagonal and SVG to OM occlusion, other grafts patent, patent stents, and severe native artery disease with progression of RCA that was heavily calcified and had been treated with medical therapy with plan for possible PCI with atherectomy if medical therapy fails  - In the ED, EKG features some non-specific ST-T abnormalities, CXR is unremarkable, and troponin is undetectable  - He was treated with ASA 324 and started on IV heparin infusion in ED  - Cardiology at [facility] was consulted by ED physician and recommended transfer to [facility] for consideration of atherectomy; Cardiologist at [facility] was then consulted by ED physician and recommended medical admission here  - Continue cardiac monitoring, serial troponin measurements, continue ASA and beta-blocker, continue nitrates, continue heparin infusion pending cardiology recommendations   2. Chronic diastolic CHF  - Peripheral edema noted, no JVD, no rales, and no dyspnea  - Continue Lasix and Coreg, follow daily wt   3. Hypertension  - BP at goal  - Continue Coreg    DVT prophylaxis: IV heparin infusion Code Status: DNR; confirmed with patient Family Communication: Discussed with patient  Consults called: Cardiology consulted by  ED physician  Admission status: Observation     Briscoe Deutscher, MD [facility] Pager [phone]  If 7PM-7AM, please contact night-coverage [email or web address] Password [email or web address]  01/31/2018, 2:52 AM

## 2018-02-01 ENCOUNTER — Other Ambulatory Visit: Payer: Self-pay

## 2018-02-01 ENCOUNTER — Encounter (HOSPITAL_COMMUNITY)
Admission: AD | Disposition: A | Discharge status: Home / Self Care | Payer: Self-pay | Source: Other Acute Inpatient Hospital | Attending: Internal Medicine

## 2018-02-01 DIAGNOSIS — I25110 Atherosclerotic heart disease of native coronary artery with unstable angina pectoris: Secondary | ICD-10-CM | POA: Diagnosis not present

## 2018-02-01 DIAGNOSIS — I200 Unstable angina: Secondary | ICD-10-CM

## 2018-02-01 DIAGNOSIS — I110 Hypertensive heart disease with heart failure: Secondary | ICD-10-CM | POA: Diagnosis not present

## 2018-02-01 DIAGNOSIS — I10 Essential (primary) hypertension: Secondary | ICD-10-CM | POA: Diagnosis not present

## 2018-02-01 DIAGNOSIS — I2510 Atherosclerotic heart disease of native coronary artery without angina pectoris: Secondary | ICD-10-CM | POA: Diagnosis not present

## 2018-02-01 DIAGNOSIS — I5032 Chronic diastolic (congestive) heart failure: Secondary | ICD-10-CM | POA: Diagnosis not present

## 2018-02-01 DIAGNOSIS — I2584 Coronary atherosclerosis due to calcified coronary lesion: Secondary | ICD-10-CM | POA: Diagnosis not present

## 2018-02-01 HISTORY — PX: LEFT HEART CATH AND CORS/GRAFTS ANGIOGRAPHY: CATH118250

## 2018-02-01 HISTORY — PX: CORONARY STENT INTERVENTION: CATH118234

## 2018-02-01 HISTORY — PX: INTRAVASCULAR ULTRASOUND/IVUS: CATH118244

## 2018-02-01 HISTORY — PX: CORONARY ATHERECTOMY: CATH118238

## 2018-02-01 LAB — CBC
HCT: 32.7 % — ABNORMAL LOW (ref 39.0–52.0)
HCT: 33.2 % — ABNORMAL LOW (ref 39.0–52.0)
Hemoglobin: 10.8 g/dL — ABNORMAL LOW (ref 13.0–17.0)
Hemoglobin: 11.1 g/dL — ABNORMAL LOW (ref 13.0–17.0)
MCH: 32 pg (ref 26.0–34.0)
MCH: 31.8 pg (ref 26.0–34.0)
MCHC: 33 g/dL (ref 30.0–36.0)
MCHC: 33.4 g/dL (ref 30.0–36.0)
MCV: 96.7 fL (ref 80.0–100.0)
MCV: 95.1 fL (ref 80.0–100.0)
Platelets: 197 10*3/uL (ref 150–400)
Platelets: 223 10*3/uL (ref 150–400)
RBC: 3.38 MIL/uL — ABNORMAL LOW (ref 4.22–5.81)
RBC: 3.49 MIL/uL — ABNORMAL LOW (ref 4.22–5.81)
RDW: 13.2 % (ref 11.5–15.5)
RDW: 13.2 % (ref 11.5–15.5)
WBC: 6.8 10*3/uL (ref 4.0–10.5)
WBC: 8 10*3/uL (ref 4.0–10.5)
nRBC: 0 % (ref 0.0–0.2)
nRBC: 0 % (ref 0.0–0.2)

## 2018-02-01 LAB — LIPID PANEL
CHOL/HDL RATIO: 3.1 ratio
Cholesterol: 165 mg/dL (ref 0–200)
HDL: 53 mg/dL (ref >40)
LDL Cholesterol: 93 mg/dL (ref 0–99)
Triglycerides: 96 mg/dL (ref <150)
VLDL: 19 mg/dL (ref 0–40)

## 2018-02-01 LAB — BASIC METABOLIC PANEL
Anion gap: 11 (ref 5–15)
BUN: 12 mg/dL (ref 8–23)
CO2: 21 mmol/L — ABNORMAL LOW (ref 22–32)
Calcium: 8.7 mg/dL — ABNORMAL LOW (ref 8.9–10.3)
Chloride: 104 mmol/L (ref 98–111)
Creatinine, Ser: 1.14 mg/dL (ref 0.61–1.24)
GFR calc Af Amer: >60 mL/min (ref >60)
GFR calc non Af Amer: >60 mL/min (ref >60)
GLUCOSE: 104 mg/dL — AB (ref 70–99)
Potassium: 4 mmol/L (ref 3.5–5.1)
Sodium: 136 mmol/L (ref 135–145)

## 2018-02-01 LAB — POCT ACTIVATED CLOTTING TIME
ACTIVATED CLOTTING TIME: 164 s
Activated Clotting Time: 279 seconds
Activated Clotting Time: 252 seconds
Activated Clotting Time: 268 seconds
Activated Clotting Time: 191 seconds
Activated Clotting Time: 175 seconds
Activated Clotting Time: 175 seconds

## 2018-02-01 LAB — PROTIME-INR
INR: 1.12
Prothrombin Time: 14.3 seconds (ref 11.4–15.2)

## 2018-02-01 LAB — CREATININE, SERUM: Creatinine, Ser: 1.06 mg/dL (ref 0.61–1.24)

## 2018-02-01 LAB — GLUCOSE, CAPILLARY: Glucose-Capillary: 155 mg/dL — ABNORMAL HIGH (ref 70–99)

## 2018-02-01 LAB — MRSA PCR SCREENING: MRSA by PCR: NEGATIVE

## 2018-02-01 LAB — HEPARIN LEVEL (UNFRACTIONATED): Heparin Unfractionated: 0.42 IU/mL (ref 0.30–0.70)

## 2018-02-01 SURGERY — CORONARY STENT INTERVENTION
Anesthesia: LOCAL

## 2018-02-01 MED ORDER — MIDAZOLAM HCL 2 MG/2ML IJ SOLN
INTRAMUSCULAR | Status: AC
  Filled 2018-02-01: qty 2

## 2018-02-01 MED ORDER — HEPARIN SODIUM (PORCINE) 1000 UNIT/ML IJ SOLN
INTRAMUSCULAR | Status: AC
  Filled 2018-02-01: qty 1

## 2018-02-01 MED ORDER — HYDROMORPHONE HCL 1 MG/ML IJ SOLN
INTRAMUSCULAR | Status: AC
  Filled 2018-02-01: qty 0.5

## 2018-02-01 MED ORDER — FENTANYL CITRATE (PF) 100 MCG/2ML IJ SOLN
INTRAMUSCULAR | Status: AC
  Filled 2018-02-01: qty 2

## 2018-02-01 MED ORDER — NITROGLYCERIN 0.4 MG SL SUBL
SUBLINGUAL_TABLET | SUBLINGUAL | Status: AC
  Filled 2018-02-01: qty 1

## 2018-02-01 MED ORDER — ONDANSETRON HCL 4 MG/2ML IJ SOLN
INTRAMUSCULAR | Status: DC | PRN
  Administered 2018-02-01: 4 mg via INTRAVENOUS

## 2018-02-01 MED ORDER — HEPARIN SODIUM (PORCINE) 1000 UNIT/ML IJ SOLN
INTRAMUSCULAR | Status: DC | PRN
  Administered 2018-02-01: 2000 [IU] via INTRAVENOUS
  Administered 2018-02-01: 3000 [IU] via INTRAVENOUS
  Administered 2018-02-01 (×2): 4500 [IU] via INTRAVENOUS

## 2018-02-01 MED ORDER — FENTANYL CITRATE (PF) 100 MCG/2ML IJ SOLN
INTRAMUSCULAR | Status: DC | PRN
  Administered 2018-02-01 (×5): 25 ug via INTRAVENOUS

## 2018-02-01 MED ORDER — VERAPAMIL HCL 2.5 MG/ML IV SOLN
INTRAVENOUS | Status: DC | PRN
  Administered 2018-02-01: 10 mL via INTRA_ARTERIAL

## 2018-02-01 MED ORDER — SODIUM CHLORIDE 0.9 % IV SOLN: 250.0000 mL | INTRAVENOUS | Status: DC | PRN

## 2018-02-01 MED ORDER — NITROGLYCERIN 0.4 MG SL SUBL
SUBLINGUAL_TABLET | SUBLINGUAL | Status: AC
  Administered 2018-02-01: 0.4 mg via ORAL
  Filled 2018-02-01: qty 1

## 2018-02-01 MED ORDER — VERAPAMIL HCL 2.5 MG/ML IV SOLN
INTRAVENOUS | Status: AC
  Filled 2018-02-01: qty 2

## 2018-02-01 MED ORDER — MIDAZOLAM HCL 2 MG/2ML IJ SOLN
INTRAMUSCULAR | Status: DC | PRN
  Administered 2018-02-01 (×3): 1 mg via INTRAVENOUS

## 2018-02-01 MED ORDER — ENOXAPARIN SODIUM 40 MG/0.4ML ~~LOC~~ SOLN
40.0000 mg | SUBCUTANEOUS | Status: DC
  Administered 2018-02-02: 40 mg via SUBCUTANEOUS
  Filled 2018-02-01: qty 0.4

## 2018-02-01 MED ORDER — NITROGLYCERIN 1 MG/10 ML FOR IR/CATH LAB
INTRA_ARTERIAL | Status: DC | PRN
  Administered 2018-02-01: 200 ug via INTRACORONARY
  Administered 2018-02-01: 100 ug via INTRACORONARY
  Administered 2018-02-01: 200 ug via INTRACORONARY

## 2018-02-01 MED ORDER — SODIUM CHLORIDE 0.9 % IV SOLN
INTRAVENOUS | Status: AC | PRN
  Administered 2018-02-01: 10 mL/h via INTRAVENOUS

## 2018-02-01 MED ORDER — VIPERSLIDE LUBRICANT OPTIME
TOPICAL | Status: DC | PRN
  Administered 2018-02-01: 18:00:00 via SURGICAL_CAVITY

## 2018-02-01 MED ORDER — SODIUM CHLORIDE 0.9 % IV SOLN
INTRAVENOUS | Status: AC
  Administered 2018-02-01: 22:00:00 via INTRAVENOUS

## 2018-02-01 MED ORDER — HEPARIN (PORCINE) IN NACL 1000-0.9 UT/500ML-% IV SOLN
INTRAVENOUS | Status: AC
  Filled 2018-02-01: qty 1000

## 2018-02-01 MED ORDER — NITROGLYCERIN IN D5W 200-5 MCG/ML-% IV SOLN
INTRAVENOUS | Status: AC | PRN
  Administered 2018-02-01: 10 ug/min via INTRAVENOUS

## 2018-02-01 MED ORDER — LIDOCAINE HCL (PF) 1 % IJ SOLN
INTRAMUSCULAR | Status: DC | PRN
  Administered 2018-02-01: 15 mL
  Administered 2018-02-01: 2 mL

## 2018-02-01 MED ORDER — HEPARIN (PORCINE) IN NACL 1000-0.9 UT/500ML-% IV SOLN
INTRAVENOUS | Status: DC | PRN
  Administered 2018-02-01 (×3): 500 mL

## 2018-02-01 MED ORDER — CLOPIDOGREL BISULFATE 300 MG PO TABS
ORAL_TABLET | ORAL | Status: AC
  Filled 2018-02-01: qty 1

## 2018-02-01 MED ORDER — MORPHINE SULFATE (PF) 2 MG/ML IV SOLN: 1.0000 mg | Freq: Once | INTRAVENOUS | Status: DC

## 2018-02-01 MED ORDER — NITROGLYCERIN IN D5W 200-5 MCG/ML-% IV SOLN
INTRAVENOUS | Status: AC
  Filled 2018-02-01: qty 250

## 2018-02-01 MED ORDER — MORPHINE SULFATE (PF) 2 MG/ML IV SOLN
INTRAVENOUS | Status: AC
  Filled 2018-02-01: qty 1

## 2018-02-01 MED ORDER — ONDANSETRON HCL 4 MG/2ML IJ SOLN
INTRAMUSCULAR | Status: AC
  Filled 2018-02-01: qty 2

## 2018-02-01 MED ORDER — ATROPINE SULFATE 1 MG/10ML IJ SOSY
PREFILLED_SYRINGE | INTRAMUSCULAR | Status: AC
  Filled 2018-02-01: qty 10

## 2018-02-01 MED ORDER — CLOPIDOGREL BISULFATE 300 MG PO TABS
ORAL_TABLET | ORAL | Status: DC | PRN
  Administered 2018-02-01: 300 mg via ORAL

## 2018-02-01 MED ORDER — NITROGLYCERIN IN D5W 200-5 MCG/ML-% IV SOLN: 2.0000 ug/min | INTRAVENOUS | Status: DC

## 2018-02-01 MED ORDER — HYDROMORPHONE HCL 1 MG/ML IJ SOLN
INTRAMUSCULAR | Status: DC | PRN
  Administered 2018-02-01: 0.5 mg via INTRAVENOUS

## 2018-02-01 MED ORDER — LABETALOL HCL 5 MG/ML IV SOLN: 10.0000 mg | INTRAVENOUS | Status: AC | PRN

## 2018-02-01 MED ORDER — HYDRALAZINE HCL 20 MG/ML IJ SOLN: 5.0000 mg | INTRAMUSCULAR | Status: AC | PRN

## 2018-02-01 MED ORDER — IOHEXOL 350 MG/ML SOLN
INTRAVENOUS | Status: DC | PRN
  Administered 2018-02-01: 235 mL via INTRA_ARTERIAL

## 2018-02-01 MED ORDER — SODIUM CHLORIDE 0.9% FLUSH: 3.0000 mL | INTRAVENOUS | Status: DC | PRN

## 2018-02-01 MED ORDER — SODIUM CHLORIDE 0.9% FLUSH
3.0000 mL | Freq: Two times a day (BID) | INTRAVENOUS | Status: DC
  Administered 2018-02-01 – 2018-02-02 (×2): 3 mL via INTRAVENOUS

## 2018-02-01 MED ORDER — LIDOCAINE HCL (PF) 1 % IJ SOLN
INTRAMUSCULAR | Status: AC
  Filled 2018-02-01: qty 30

## 2018-02-01 SURGICAL SUPPLY — 36 items
BALLN EUPHORA RX 2.5X12 (BALLOONS) ×2
BALLN MINITREK OTW 1.5X6 (BALLOONS) ×2
BALLN SAPPHIRE ~~LOC~~ 3.0X18 (BALLOONS) ×1 IMPLANT
BALLN SAPPHIRE ~~LOC~~ 3.5X18 (BALLOONS) ×1 IMPLANT
BALLOON EUPHORA RX 2.5X12 (BALLOONS) IMPLANT
BALLOON MINITREK OTW 1.5X6 (BALLOONS) IMPLANT
CABLE ADAPT CONN TEMP 6FT (ADAPTER) ×1 IMPLANT
CATH DRAGONFLY OPTIS 2.7FR (CATHETERS) ×1 IMPLANT
CATH INFINITI 5 FR IM (CATHETERS) ×1 IMPLANT
CATH INFINITI 5FR MULTPACK ANG (CATHETERS) ×1 IMPLANT
CATH INFINITI 6F MPA2 100CM (CATHETERS) ×1 IMPLANT
CATH LAUNCHER 6FR AL.75 (CATHETERS) ×1 IMPLANT
CATH S G BIP PACING (CATHETERS) ×1 IMPLANT
CROWN DIAMONDBACK CLASSIC 1.25 (BURR) ×1 IMPLANT
DEVICE RAD COMP TR BAND LRG (VASCULAR PRODUCTS) ×1 IMPLANT
ELECT DEFIB PAD ADLT CADENCE (PAD) ×1 IMPLANT
GLIDESHEATH SLEND SS 6F .021 (SHEATH) ×1 IMPLANT
GUIDEWIRE INQWIRE 1.5J.035X260 (WIRE) IMPLANT
INQWIRE 1.5J .035X260CM (WIRE) ×2
KIT ENCORE 26 ADVANTAGE (KITS) ×1 IMPLANT
KIT HEART LEFT (KITS) ×2 IMPLANT
KIT HEMO VALVE WATCHDOG (MISCELLANEOUS) ×1 IMPLANT
KIT MICROPUNCTURE NIT STIFF (SHEATH) ×1 IMPLANT
LUBRICANT VIPERSLIDE CORONARY (MISCELLANEOUS) ×1 IMPLANT
PACK CARDIAC CATHETERIZATION (CUSTOM PROCEDURE TRAY) ×2 IMPLANT
SHEATH PINNACLE 6F 10CM (SHEATH) ×1 IMPLANT
SHIELD RADPAD SCOOP 12X17 (MISCELLANEOUS) ×1 IMPLANT
SLEEVE REPOSITIONING LENGTH 30 (MISCELLANEOUS) ×1 IMPLANT
STENT SYNERGY DES 2.75X24 (Permanent Stent) ×1 IMPLANT
STENT SYNERGY DES 3.5X38 (Permanent Stent) ×1 IMPLANT
TRANSDUCER W/STOPCOCK (MISCELLANEOUS) ×2 IMPLANT
TUBING CIL FLEX 10 FLL-RA (TUBING) ×2 IMPLANT
WIRE MARVEL STR TIP 190CM (WIRE) ×1 IMPLANT
WIRE RUNTHROUGH .014X300CM (WIRE) ×1 IMPLANT
WIRE STRETCH EXTENSION (WIRE) ×1 IMPLANT
WIRE VIPERWIRE COR FLEX .012 (WIRE) ×1 IMPLANT

## 2018-02-01 NOTE — Brief Op Note (Signed)
BRIEF CARDIAC CATHETERIZATION NOTE  02/01/2018  5:52 PM  PATIENT:  Austin Malone  78 y.o. male  PRE-OPERATIVE DIAGNOSIS:  unstable angina  POST-OPERATIVE DIAGNOSIS:  Same  PROCEDURE:  Procedure(s): CORONARY STENT INTERVENTION (N/A) Intravascular Ultrasound/IVUS (N/A) LEFT HEART CATH AND CORS/GRAFTS ANGIOGRAPHY (N/A) CORONARY ATHERECTOMY (N/A)  SURGEON:  Surgeon(s) and Role:    Yvonne Kendall, MD - Primary  FINDINGS: 1. Severe 3-vessel native CAD; mid RCA stenosis is more severe (90-95%) than in 08/2017.  Otherwise, no significant change is noted. 2. Patent SVG-rPDA with stable mild to moderate disease. 3. Patient LIMA-LAD. 4. Known occlusions of SVG-D and SVG-OM; grafts were not engaged on today's study. 5. Successful PCI to mid RCA using orbital atherectomy and Synergy 3.5 x 38 mm drug-eluting stent. 6. Successful PCI to distal RCA and r-PL using Synergy 2.75 x 24 mm drug-eluting stent. 7. Successful placement of temporary transvenous pacemaker via the right femoral vein.  RECOMMENDATIONS: 1. DAPT with ASA and clopidogrel for at least 12 months, ideally longer. 2. Aggressive secondary prevention.  Yvonne Kendall, MD [facility] Pager: [phone]

## 2018-02-01 NOTE — Progress Notes (Signed)
[facility]  Date: 02/01/18 Time: 3:01 PM  Patient has DNR/DNI order in place.  Risks and benefits of cardiac catheterization discussed with the patient, who wishes to proceed.  He will rescind his DNR/DNI order during the procedure.  Full code has been entered into Epic (will be returned to DNR/DNI following the procedure).  Yvonne Kendall, MD [facility] Pager: [phone]

## 2018-02-01 NOTE — Progress Notes (Addendum)
Progress Note  Patient Name: Austin Malone Date of Encounter: 02/01/2018  Primary Cardiologist: Garwin Brothersajan R Revankar, MD   Subjective   Pt continues to have recurrent chest pain overnight, relieved with SL NTG. Plan for cath with arthrectomy to RCA today   Inpatient Medications    Scheduled Meds: . alfuzosin  10 mg Oral Daily  . aspirin EC  81 mg Oral Daily  . bethanechol  50 mg Oral TID  . carvedilol  6.25 mg Oral BID WC  . clopidogrel  75 mg Oral Daily  . diphenhydrAMINE  50 mg Oral Once   Or  . diphenhydrAMINE  50 mg Intravenous Once  . docusate sodium  100 mg Oral Daily  . finasteride  5 mg Oral Daily  . furosemide  20 mg Oral Daily  . hydrocortisone   Topical BID  . isosorbide mononitrate  60 mg Oral Daily  . latanoprost  1 drop Both Eyes QHS  . linaclotide  145 mcg Oral QAC breakfast  . methylPREDNISolone (SOLU-MEDROL) injection  40 mg Intravenous Q4H  .  morphine injection  1 mg Intravenous Once  . morphine      . pantoprazole  40 mg Oral Daily  . ranolazine  1,000 mg Oral BID  . sodium chloride flush  3 mL Intravenous Q12H   Continuous Infusions: . sodium chloride    . sodium chloride 1 mL/kg/hr (02/01/18 0502)  . heparin 1,100 Units/hr (02/01/18 16100638)  . nitroGLYCERIN     PRN Meds: sodium chloride, acetaminophen, ALPRAZolam, nitroGLYCERIN, ondansetron (ZOFRAN) IV, sodium chloride flush   Vital Signs    Vitals:   01/31/18 1010 01/31/18 1445 01/31/18 2053 02/01/18 0623  BP: (!) 149/67 (!) 101/56 132/66 (!) 172/81  Pulse:  60 69   Resp: 18 18 18 19   Temp:  98.4 F (36.9 C) 98.5 F (36.9 C)   TempSrc:  Oral Oral   SpO2:   96%   Weight:    87.4 kg  Height:        Intake/Output Summary (Last 24 hours) at 02/01/2018 0756 Last data filed at 02/01/2018 [id number] Gross per 24 hour  Intake 1653.87 ml  Output 1800 ml  Net -146.13 ml   Filed Weights   01/31/18 0224 02/01/18 0623  Weight: 89 kg 87.4 kg   Physical Exam   General: Well developed, well  nourished, NAD Skin: Warm, dry, intact  Head: Normocephalic, atraumatic, clear, moist mucus membranes. Neck: Negative for carotid bruits. No JVD Lungs:Clear to ausculation bilaterally. No wheezes, rales, or rhonchi. Breathing is unlabored. Cardiovascular: RRR with S1 S2. No murmurs, rubs, gallops, or LV heave appreciated. Abdomen: Soft, non-tender, non-distended with normoactive bowel sounds. No obvious abdominal masses. MSK: Strength and tone appear normal for age. 5/5 in all extremities Extremities: 1+ BLE edema. No clubbing or cyanosis. DP/PT pulses 2+ bilaterally Neuro: Alert and oriented. No focal deficits. No facial asymmetry. MAE spontaneously. Psych: Responds to questions appropriately with normal affect.    Labs    Chemistry Recent Labs  Lab 01/31/18 0346 02/01/18 0415  NA 136 136  K 4.1 4.0  CL 103 104  CO2 22 21*  GLUCOSE 132* 104*  BUN 14 12  CREATININE 1.08 1.14  CALCIUM 8.6* 8.7*  PROT 5.7*  --   ALBUMIN 3.2*  --   AST 20  --   ALT 14  --   ALKPHOS 34*  --   BILITOT 0.6  --   GFRNONAA >60 >60  GFRAA >60 >  60  ANIONGAP 11 11     Hematology Recent Labs  Lab 01/31/18 0346 02/01/18 0415  WBC 5.8 6.8  RBC 3.40* 3.38*  HGB 10.9* 10.8*  HCT 33.0* 32.7*  MCV 97.1 96.7  MCH 32.1 32.0  MCHC 33.0 33.0  RDW 13.2 13.2  PLT 208 197    Cardiac Enzymes Recent Labs  Lab 01/31/18 0346 01/31/18 0922 01/31/18 1536  TROPONINI <0.03 <0.03 <0.03   No results for input(s): TROPIPOC in the last 168 hours.   BNPNo results for input(s): BNP, PROBNP in the last 168 hours.   DDimer No results for input(s): DDIMER in the last 168 hours.   Radiology    No results found.  Telemetry    02/01/2018 NSR- Personally Reviewed  ECG    No new tracings as of 02/01/2018- Personally Reviewed  Cardiac Studies   Cardiac cath 08/2017 Conclusions: 1. Severe native coronary artery disease, as detailed below. 2. Widely patent LIMA to LAD. 3. Patent SVG to RPDA with  patent stents and mild to moderate, nonobstructive disease. 4. Chronically occluded SVG to diagonal and SVG to OM. 5. Mildly elevated left ventricular filling pressure. 6. Focal basal inferior hypokinesis with otherwise preserved left ventricular systolic function.  Recommendations: 1. Based on outside cath reports, I suspect that progression of native RCA disease may be to blame for the patient's chest pain and mild troponin elevation. I favor medical therapy given heavy calcification of the artery. If he continues to have recurrent angina, PCI with atherectomy will need to be considered, as the RCA continuation and PL branches are not supplied by the SVG to RPDA. I will start isosorbide mononitrate, which can be uptitrated as blood pressure allows. 2. Medical therapy for NSTEMI with at least 12 months of dual antiplatelet therapy. Will start heparin infusion and complete 24 to 48 hours of IV heparin. 3. Hold diuresis today. Consider restarting gentle diuresis again tomorrow if renal function is stable.  Recommend uninterrupted dual antiplatelet therapy with Aspirin 81mg  daily and Clopidogrel 75mg  dailyfor a minimum of 12 months (ACS - Class I recommendation). Diagnostic  Dominance: Right    Echo 08/22/17  Study Conclusions  - Left ventricle: The cavity size was mildly dilated. Wall thickness was normal. Systolic function was normal. The estimated ejection fraction was in the range of 55% to 60%. Wall motion was normal; there were no regional wall motion abnormalities. Doppler parameters are consistent with abnormal left ventricular relaxation (grade 1 diastolic dysfunction). - Aortic valve: There was mild regurgitation. - Mitral valve: There was mild regurgitation. - Left atrium: The atrium was mildly dilated.  Impressions:  - Definity used; normal LV systolic function; mild diastolic dysfunction; mild LVE; mild AI and MR; mild LAE.  Patient Profile     78  y.o. male with a hx of CAD s/p CABG with graft stenosis and prior PCIs in SVG to PDA and chronically occluded VG to diag and VG to OM and patent LIMA,  though on cath with medical therapy, HLD, HTN, hx MI, PUD and chronic foley catheter who is being seen today for the evaluation of chest pain at the request of Dr. Rinaldo [facility]olph.  Assessment & Plan    1.  Unstable angina with history of CAD s/p CABG in 2008: -Patient with a known history of CAD with severe right coronary artery disease which is significantly calcified. Plan initially was for continued medical therapy and to consider PCI if recurrent angina -Patient presented 01/31/2018 with unstable angina with plans  for scheduled LHC with PCI with arthrectomy today, 02/01/2018 -Continues to have recurrent chest pain overnight>>releived with SL NTG>>comfortable now  -Continue ASA 81, carvedilol 6.25 mg, Plavix 75, Imdur 60 mg, Ranexa 1000 mg  2.  HTN: -Stable, 172/81, 132/66, 101/56 -Continue carvedilol 6.25 mg  3.  HLD: -Stable,LDL 09/18/2017, 92 -Statin intolerant  4.  BLE: -Placed on Lasix 20 mg with stabilization  5.  Chronic Foley: -Status post prostate surgery 09/2017>>> upcoming trial without Foley, if failed therapy then suprapubic catheter per urology  6.  Right lower extremity ulcer: -Wound care consultation 01/31/2018, per internal medicine and WOC team   Signed, Georgie Chard NP-C [facility] Pager: [phone] 02/01/2018, 7:56 AM     For questions or updates, please contact   Please consult [email or web address] for contact info under Cardiology/STEMI.   Patient seen and examined and history reviewed. Agree with above findings and plan. Patient with prior CABG and complex anatomy presents with refractory class 3 angina despite optimal medical therapy. He has been hydrated and pretreated for contrast allergy. Plan to repeat cardiac cath today. If no change from August 2019 consider atherectomy and stenting of the native RCA.   Aleida Crandell  [place], MDFACC 02/01/2018 9:56 AM

## 2018-02-01 NOTE — Interval H&P Note (Signed)
History and Physical Interval Note:  02/01/2018 3:00 PM  Austin Malone  has presented today for cardiac catheterization, with the diagnosis of unstable angina  The various methods of treatment have been discussed with the patient and family. After consideration of risks, benefits and other options for treatment, the patient has consented to  Procedure(s): CORONARY STENT INTERVENTION (N/A) as a surgical intervention .  The patient's history has been reviewed, patient examined, no change in status, stable for surgery.  I have reviewed the patient's chart and labs.  Questions were answered to the patient's satisfaction.    Cath Lab Visit (complete for each Cath Lab visit)  Clinical Evaluation Leading to the Procedure:   ACS: Yes.    Non-ACS:  N/A  [name]

## 2018-02-01 NOTE — Progress Notes (Signed)
[facility]                                                                              Patient Demographics  Austin Malone, is a 78 y.o. male, DOB - 03/22/1940, NWG:[id number]  Admit date - 01/31/2018   Admitting Physician Briscoe Deutscherimothy S Opyd, MD  Outpatient Primary MD for the patient is Shelbie AmmonsHaque, Imran P, MD  Outpatient specialists:   LOS - 1  days   Medical records reviewed and are as summarized below:     chief complaint on file: Chest pain      Brief summary   Patient is 78 year old male with history of CAD status post CABG, hypertension, BPH presented to ED with chest pain.  Per patient he has been having increasingly frequent episodes of chest pain usually at night, symptoms resolving with sublingual nitro, sometimes taking 5 times per night.  Patient was admitted for chest pain rule out.  Assessment & Plan    Principal Problem:   Unstable angina (HCC) with known history of CAD status post CABG -Presented with increasingly frequent chest pain episodes, resolved with nitroglycerin sublingual -Patient had a cardiac cath in 08/2017, showed chronic SVG to diagonal and SVG to OM occlusion, other grafts patent, stents patent, severe native artery disease with progression of RCA that was heavily calcified. -Cardiology was consulted, serial troponins negative.  Plan for cardiac cath with PCI today.  Active Problems:   Hypertension -BP currently at goal, continue Coreg     Chronic diastolic CHF (congestive heart failure) (HCC) Continue Lasix, Coreg, not in acute failure, no JVD or dyspnea  Code Status: DNR  DVT Prophylaxis: Heparin Family Communication: Discussed in detail with the patient, all imaging results, lab results explained to the patient    Disposition Plan: Home when cleared by cardiology  Time Spent in minutes 25 minutes  Procedures:    Consultants:   Cardiology  Antimicrobials:   Anti-infectives (From admission, onward)   None           Medications  Scheduled Meds: . alfuzosin  10 mg Oral Daily  . aspirin EC  81 mg Oral Daily  . bethanechol  50 mg Oral TID  . carvedilol  6.25 mg Oral BID WC  . clopidogrel  75 mg Oral Daily  . docusate sodium  100 mg Oral Daily  . finasteride  5 mg Oral Daily  . furosemide  20 mg Oral Daily  . hydrocortisone   Topical BID  . isosorbide mononitrate  60 mg Oral Daily  . latanoprost  1 drop Both Eyes QHS  . linaclotide  145 mcg Oral QAC breakfast  . methylPREDNISolone (SOLU-MEDROL) injection  40 mg Intravenous Q4H  .  morphine injection  1 mg Intravenous Once  . morphine      . pantoprazole  40 mg Oral Daily  . ranolazine  1,000 mg Oral BID  . sodium chloride flush  3 mL Intravenous Q12H   Continuous Infusions: . sodium chloride    . sodium chloride 1 mL/kg/hr (02/01/18 0502)  . heparin 1,100 Units/hr (02/01/18 [id number])  . nitroGLYCERIN     PRN Meds:.sodium chloride, acetaminophen,  ALPRAZolam, nitroGLYCERIN, ondansetron (ZOFRAN) IV, sodium chloride flush      Subjective:   Austin BibleJames Mckibbin was seen and examined today.  At the time of my examination, patient was complaining of chest pain 8/10, midsternal, no radiation, sitting on edge of the bed.  Requested RN for sublingual nitro, pain eased off after NTG. Patient denies dizziness, abdominal pain, N/V/D/C, new weakness, numbess, tingling. No acute events overnight.    Objective:   Vitals:   01/31/18 1445 01/31/18 2053 02/01/18 0623 02/01/18 1130  BP: (!) 101/56 132/66 (!) 172/81 (!) 158/78  Pulse: 60 69  68  Resp: 18 18 19 17   Temp: 98.4 F (36.9 C) 98.5 F (36.9 C)  97.7 F (36.5 C)  TempSrc: Oral Oral  Oral  SpO2:  96%  96%  Weight:   87.4 kg   Height:        Intake/Output Summary (Last 24 hours) at 02/01/2018 1135 Last data filed at 02/01/2018 0826 Gross per 24 hour  Intake 1653.87 ml  Output 2050 ml  Net -396.13 ml     Wt Readings from Last 3 Encounters:  02/01/18 87.4 kg  01/27/18 88.9 kg   11/30/17 88.5 kg     Exam  General: Alert and oriented x 3, NAD  Eyes:   HEENT:  Atraumatic, normocephalic  Cardiovascular: S1 S2 auscultated Regular rate and rhythm.  Respiratory: Clear to auscultation bilaterally, no wheezing, rales or rhonchi  Gastrointestinal: Soft, nontender, nondistended, + bowel sounds  Ext: no pedal edema bilaterally  Neuro: No new neuro deficits  Musculoskeletal: No digital cyanosis, clubbing  Skin: No rashes  Psych: Normal affect and demeanor, alert and oriented x3    Data Reviewed:  I have personally reviewed following labs and imaging studies  Micro Results No results found for this or any previous visit (from the past 240 hour(s)).  Radiology Reports No results found.  Lab Data:  CBC: Recent Labs  Lab 01/31/18 0346 02/01/18 0415  WBC 5.8 6.8  NEUTROABS 2.6  --   HGB 10.9* 10.8*  HCT 33.0* 32.7*  MCV 97.1 96.7  PLT 208 197   Basic Metabolic Panel: Recent Labs  Lab 01/31/18 0346 02/01/18 0415  NA 136 136  K 4.1 4.0  CL 103 104  CO2 22 21*  GLUCOSE 132* 104*  BUN 14 12  CREATININE 1.08 1.14  CALCIUM 8.6* 8.7*   GFR: Estimated Creatinine Clearance: 55.1 mL/min (by C-G formula based on SCr of 1.14 mg/dL). Liver Function Tests: Recent Labs  Lab 01/31/18 0346  AST 20  ALT 14  ALKPHOS 34*  BILITOT 0.6  PROT 5.7*  ALBUMIN 3.2*   No results for input(s): LIPASE, AMYLASE in the last 168 hours. No results for input(s): AMMONIA in the last 168 hours. Coagulation Profile: Recent Labs  Lab 02/01/18 0415  INR 1.12   Cardiac Enzymes: Recent Labs  Lab 01/31/18 0346 01/31/18 0922 01/31/18 1536  TROPONINI <0.03 <0.03 <0.03   BNP (last 3 results) No results for input(s): PROBNP in the last 8760 hours. HbA1C: No results for input(s): HGBA1C in the last 72 hours. CBG: No results for input(s): GLUCAP in the last 168 hours. Lipid Profile: Recent Labs    02/01/18 0415  CHOL 165  HDL 53  LDLCALC 93  TRIG  96  CHOLHDL 3.1   Thyroid Function Tests: No results for input(s): TSH, T4TOTAL, FREET4, T3FREE, THYROIDAB in the last 72 hours. Anemia Panel: No results for input(s): VITAMINB12, FOLATE, FERRITIN, TIBC, IRON, RETICCTPCT in  the last 72 hours. Urine analysis: No results found for: COLORURINE, APPEARANCEUR, LABSPEC, PHURINE, GLUCOSEU, HGBUR, BILIRUBINUR, KETONESUR, PROTEINUR, UROBILINOGEN, NITRITE, LEUKOCYTESUR   Ripudeep Rai M.D. [facility] 02/01/2018, 11:35 AM  Pager: [phone] Between 7am to 7pm - call Pager - [phone]  After 7pm go to [email or web address] - password [email or web address]  Call night coverage person covering after 7pm

## 2018-02-01 NOTE — Progress Notes (Signed)
ANTICOAGULATION CONSULT NOTE - Follow-Up Consult  Pharmacy Consult for Heparin Indication: chest pain/ACS  Allergies  Allergen Reactions  . Codeine Other (See Comments)    unknown  . Lisinopril Cough  . Contrast Media [Iodinated Diagnostic Agents] Hives and Rash    Patient Measurements: Height: 5\' 10"  (177.8 cm) Weight: 192 lb 9.6 oz (87.4 kg) IBW/kg (Calculated) : 73  Vital Signs: Temp: 98.5 F (36.9 C) (01/19 2053) Temp Source: Oral (01/19 2053) BP: 172/81 (01/20 0623) Pulse Rate: 69 (01/19 2053)  Labs (at [facility]): WBC  5.2 Hgb  12.3 Hct  35.9 Plt  223  SCr  1.0 Recent Labs    01/31/18 0346 01/31/18 0922 01/31/18 1536 02/01/18 0415  HGB 10.9*  --   --  10.8*  HCT 33.0*  --   --  32.7*  PLT 208  --   --  197  LABPROT  --   --   --  14.3  INR  --   --   --  1.12  HEPARINUNFRC  --  0.30  --  0.42  CREATININE 1.08  --   --  1.14  TROPONINI <0.03 <0.03 <0.03  --     Estimated Creatinine Clearance: 55.1 mL/min (by C-G formula based on SCr of 1.14 mg/dL).   Medical History: Past Medical History:  Diagnosis Date  . Anxiety   . Arthritis   . BPH (benign prostatic hyperplasia)   . CAD (coronary artery disease)   . Cataracts, bilateral   . Enlarged prostate   . GERD (gastroesophageal reflux disease)   . Glaucoma   . Hyperlipidemia   . Hypertension   . Non-ST elevation (NSTEMI) myocardial infarction (HCC) 08/21/2017  . Pneumonia   . PUD (peptic ulcer disease)   . Rotator cuff tear     Medications:  No current facility-administered medications on file prior to encounter.    Current Outpatient Medications on File Prior to Encounter  Medication Sig Dispense Refill  . alfuzosin (UROXATRAL) 10 MG 24 hr tablet Take 1 tablet by mouth daily.    Marland Kitchen ALPRAZolam (XANAX) 0.5 MG tablet Take 0.5 mg by mouth 2 (two) times daily as needed for anxiety or sleep.     Marland Kitchen aspirin EC 81 MG tablet Take 81 mg by mouth daily.    . bethanechol (URECHOLINE) 50 MG tablet  Take 50 mg by mouth 3 (three) times daily.    . carvedilol (COREG) 6.25 MG tablet Take 1 tablet by mouth 2 (two) times daily.    Marland Kitchen docusate sodium (COLACE) 100 MG capsule Take 100 mg by mouth daily.    . furosemide (LASIX) 20 MG tablet Take 20 mg by mouth daily as needed for fluid.     Marland Kitchen linaclotide (LINZESS) 145 MCG CAPS capsule Take 145 mcg by mouth daily before breakfast.    . Melatonin 5 MG TABS Take 40 mg by mouth at bedtime.    . nitroGLYCERIN (NITROSTAT) 0.4 MG SL tablet Place 0.4 mg under the tongue every 5 (five) minutes as needed for chest pain.     Marland Kitchen ondansetron (ZOFRAN) 8 MG tablet Take 8 mg by mouth 2 (two) times daily.     . pantoprazole (PROTONIX) 40 MG tablet Take 40 mg by mouth daily.     . ranolazine (RANEXA) 1000 MG SR tablet Take 1,000 mg by mouth 2 (two) times daily.     . TRAVATAN Z 0.004 % SOLN ophthalmic solution Place 1 drop into both eyes 2 (two) times  daily.   6  . dorzolamide-timolol (COSOPT) 22.3-6.8 MG/ML ophthalmic solution Place 1 drop into both eyes 2 (two) times daily.    . folic acid (FOLVITE) 1 MG tablet Take 1 tablet (1 mg total) by mouth daily. (Patient not taking: Reported on 01/31/2018) 30 tablet 0     Assessment: 71 yoM with known CAD admitted with CP on IV heparin per pharmacy.   HL therapeutic this AM at 0.42, H/H low but baseline for pt and stable, pltc WNL. Patient has no overt bleeding or complications with infusion per nurse.  Goal of Therapy:  Heparin level 0.3-0.7 units/ml Monitor platelets by anticoagulation protocol: Yes   Plan:  -Continue IV heparin at 1100 units/hr -Daily HL and CBC -Continue monitoring H&H, plts, s/sx of bleeding  Greer Pickerel Pharm[facility] Student 02/01/2018

## 2018-02-01 NOTE — H&P (View-Only) (Signed)
Progress Note  Patient Name: Austin Malone Date of Encounter: 02/01/2018  Primary Cardiologist: Garwin Brothersajan R Revankar, MD   Subjective   Pt continues to have recurrent chest pain overnight, relieved with SL NTG. Plan for cath with arthrectomy to RCA today   Inpatient Medications    Scheduled Meds: . alfuzosin  10 mg Oral Daily  . aspirin EC  81 mg Oral Daily  . bethanechol  50 mg Oral TID  . carvedilol  6.25 mg Oral BID WC  . clopidogrel  75 mg Oral Daily  . diphenhydrAMINE  50 mg Oral Once   Or  . diphenhydrAMINE  50 mg Intravenous Once  . docusate sodium  100 mg Oral Daily  . finasteride  5 mg Oral Daily  . furosemide  20 mg Oral Daily  . hydrocortisone   Topical BID  . isosorbide mononitrate  60 mg Oral Daily  . latanoprost  1 drop Both Eyes QHS  . linaclotide  145 mcg Oral QAC breakfast  . methylPREDNISolone (SOLU-MEDROL) injection  40 mg Intravenous Q4H  .  morphine injection  1 mg Intravenous Once  . morphine      . pantoprazole  40 mg Oral Daily  . ranolazine  1,000 mg Oral BID  . sodium chloride flush  3 mL Intravenous Q12H   Continuous Infusions: . sodium chloride    . sodium chloride 1 mL/kg/hr (02/01/18 0502)  . heparin 1,100 Units/hr (02/01/18 [id number])  . nitroGLYCERIN     PRN Meds: sodium chloride, acetaminophen, ALPRAZolam, nitroGLYCERIN, ondansetron (ZOFRAN) IV, sodium chloride flush   Vital Signs    Vitals:   01/31/18 1010 01/31/18 1445 01/31/18 2053 02/01/18 0623  BP: (!) 149/67 (!) 101/56 132/66 (!) 172/81  Pulse:  60 69   Resp: 18 18 18 19   Temp:  98.4 F (36.9 C) 98.5 F (36.9 C)   TempSrc:  Oral Oral   SpO2:   96%   Weight:    87.4 kg  Height:        Intake/Output Summary (Last 24 hours) at 02/01/2018 0756 Last data filed at 02/01/2018 [id number] Gross per 24 hour  Intake 1653.87 ml  Output 1800 ml  Net -146.13 ml   Filed Weights   01/31/18 0224 02/01/18 0623  Weight: 89 kg 87.4 kg   Physical Exam   General: Well developed, well  nourished, NAD Skin: Warm, dry, intact  Head: Normocephalic, atraumatic, clear, moist mucus membranes. Neck: Negative for carotid bruits. No JVD Lungs:Clear to ausculation bilaterally. No wheezes, rales, or rhonchi. Breathing is unlabored. Cardiovascular: RRR with S1 S2. No murmurs, rubs, gallops, or LV heave appreciated. Abdomen: Soft, non-tender, non-distended with normoactive bowel sounds. No obvious abdominal masses. MSK: Strength and tone appear normal for age. 5/5 in all extremities Extremities: 1+ BLE edema. No clubbing or cyanosis. DP/PT pulses 2+ bilaterally Neuro: Alert and oriented. No focal deficits. No facial asymmetry. MAE spontaneously. Psych: Responds to questions appropriately with normal affect.    Labs    Chemistry Recent Labs  Lab 01/31/18 0346 02/01/18 0415  NA 136 136  K 4.1 4.0  CL 103 104  CO2 22 21*  GLUCOSE 132* 104*  BUN 14 12  CREATININE 1.08 1.14  CALCIUM 8.6* 8.7*  PROT 5.7*  --   ALBUMIN 3.2*  --   AST 20  --   ALT 14  --   ALKPHOS 34*  --   BILITOT 0.6  --   GFRNONAA >60 >60  GFRAA >60 >  60  ANIONGAP 11 11     Hematology Recent Labs  Lab 01/31/18 0346 02/01/18 0415  WBC 5.8 6.8  RBC 3.40* 3.38*  HGB 10.9* 10.8*  HCT 33.0* 32.7*  MCV 97.1 96.7  MCH 32.1 32.0  MCHC 33.0 33.0  RDW 13.2 13.2  PLT 208 197    Cardiac Enzymes Recent Labs  Lab 01/31/18 0346 01/31/18 0922 01/31/18 1536  TROPONINI <0.03 <0.03 <0.03   No results for input(s): TROPIPOC in the last 168 hours.   BNPNo results for input(s): BNP, PROBNP in the last 168 hours.   DDimer No results for input(s): DDIMER in the last 168 hours.   Radiology    No results found.  Telemetry    02/01/2018 NSR- Personally Reviewed  ECG    No new tracings as of 02/01/2018- Personally Reviewed  Cardiac Studies   Cardiac cath 08/2017 Conclusions: 1. Severe native coronary artery disease, as detailed below. 2. Widely patent LIMA to LAD. 3. Patent SVG to RPDA with  patent stents and mild to moderate, nonobstructive disease. 4. Chronically occluded SVG to diagonal and SVG to OM. 5. Mildly elevated left ventricular filling pressure. 6. Focal basal inferior hypokinesis with otherwise preserved left ventricular systolic function.  Recommendations: 1. Based on outside cath reports, I suspect that progression of native RCA disease may be to blame for the patient's chest pain and mild troponin elevation. I favor medical therapy given heavy calcification of the artery. If he continues to have recurrent angina, PCI with atherectomy will need to be considered, as the RCA continuation and PL branches are not supplied by the SVG to RPDA. I will start isosorbide mononitrate, which can be uptitrated as blood pressure allows. 2. Medical therapy for NSTEMI with at least 12 months of dual antiplatelet therapy. Will start heparin infusion and complete 24 to 48 hours of IV heparin. 3. Hold diuresis today. Consider restarting gentle diuresis again tomorrow if renal function is stable.  Recommend uninterrupted dual antiplatelet therapy with Aspirin 81mg daily and Clopidogrel 75mg dailyfor a minimum of 12 months (ACS - Class I recommendation). Diagnostic  Dominance: Right    Echo 08/22/17  Study Conclusions  - Left ventricle: The cavity size was mildly dilated. Wall thickness was normal. Systolic function was normal. The estimated ejection fraction was in the range of 55% to 60%. Wall motion was normal; there were no regional wall motion abnormalities. Doppler parameters are consistent with abnormal left ventricular relaxation (grade 1 diastolic dysfunction). - Aortic valve: There was mild regurgitation. - Mitral valve: There was mild regurgitation. - Left atrium: The atrium was mildly dilated.  Impressions:  - Definity used; normal LV systolic function; mild diastolic dysfunction; mild LVE; mild AI and MR; mild LAE.  Patient Profile     78  y.o. male with a hx of CAD s/p CABG with graft stenosis and prior PCIs in SVG to PDA and chronically occluded VG to diag and VG to OM and patent LIMA,  though on cath with medical therapy, HLD, HTN, hx MI, PUD and chronic foley catheter who is being seen today for the evaluation of chest pain at the request of Dr. Kadolph.  Assessment & Plan    1.  Unstable angina with history of CAD s/p CABG in 2008: -Patient with a known history of CAD with severe right coronary artery disease which is significantly calcified. Plan initially was for continued medical therapy and to consider PCI if recurrent angina -Patient presented 01/31/2018 with unstable angina with plans   for scheduled LHC with PCI with arthrectomy today, 02/01/2018 -Continues to have recurrent chest pain overnight>>releived with SL NTG>>comfortable now  -Continue ASA 81, carvedilol 6.25 mg, Plavix 75, Imdur 60 mg, Ranexa 1000 mg  2.  HTN: -Stable, 172/81, 132/66, 101/56 -Continue carvedilol 6.25 mg  3.  HLD: -Stable,LDL 09/18/2017, 92 -Statin intolerant  4.  BLE: -Placed on Lasix 20 mg with stabilization  5.  Chronic Foley: -Status post prostate surgery 09/2017>>> upcoming trial without Foley, if failed therapy then suprapubic catheter per urology  6.  Right lower extremity ulcer: -Wound care consultation 01/31/2018, per internal medicine and WOC team   Signed, Georgie Chard NP-C [facility] Pager: [phone] 02/01/2018, 7:56 AM     For questions or updates, please contact   Please consult [email or web address] for contact info under Cardiology/STEMI.   Patient seen and examined and history reviewed. Agree with above findings and plan. Patient with prior CABG and complex anatomy presents with refractory class 3 angina despite optimal medical therapy. He has been hydrated and pretreated for contrast allergy. Plan to repeat cardiac cath today. If no change from August 2019 consider atherectomy and stenting of the native RCA.   Peter  [place], MDFACC 02/01/2018 9:56 AM

## 2018-02-02 ENCOUNTER — Encounter (HOSPITAL_COMMUNITY): Payer: Self-pay | Admitting: Internal Medicine

## 2018-02-02 DIAGNOSIS — I2510 Atherosclerotic heart disease of native coronary artery without angina pectoris: Secondary | ICD-10-CM | POA: Diagnosis not present

## 2018-02-02 DIAGNOSIS — Z955 Presence of coronary angioplasty implant and graft: Secondary | ICD-10-CM | POA: Diagnosis not present

## 2018-02-02 DIAGNOSIS — I25110 Atherosclerotic heart disease of native coronary artery with unstable angina pectoris: Principal | ICD-10-CM

## 2018-02-02 DIAGNOSIS — I200 Unstable angina: Secondary | ICD-10-CM | POA: Diagnosis not present

## 2018-02-02 DIAGNOSIS — I5032 Chronic diastolic (congestive) heart failure: Secondary | ICD-10-CM | POA: Diagnosis not present

## 2018-02-02 LAB — CBC
HCT: 30.8 % — ABNORMAL LOW (ref 39.0–52.0)
Hemoglobin: 10.6 g/dL — ABNORMAL LOW (ref 13.0–17.0)
MCH: 33.1 pg (ref 26.0–34.0)
MCHC: 34.4 g/dL (ref 30.0–36.0)
MCV: 96.3 fL (ref 80.0–100.0)
Platelets: 214 10*3/uL (ref 150–400)
RBC: 3.2 MIL/uL — ABNORMAL LOW (ref 4.22–5.81)
RDW: 13.3 % (ref 11.5–15.5)
WBC: 11.2 10*3/uL — ABNORMAL HIGH (ref 4.0–10.5)
nRBC: 0 % (ref 0.0–0.2)

## 2018-02-02 LAB — T4, FREE: Free T4: 0.79 ng/dL — ABNORMAL LOW (ref 0.82–1.77)

## 2018-02-02 LAB — BASIC METABOLIC PANEL
Anion gap: 9 (ref 5–15)
BUN: 16 mg/dL (ref 8–23)
CALCIUM: 8.5 mg/dL — AB (ref 8.9–10.3)
CO2: 21 mmol/L — ABNORMAL LOW (ref 22–32)
CREATININE: 1.17 mg/dL (ref 0.61–1.24)
Chloride: 106 mmol/L (ref 98–111)
GFR calc Af Amer: >60 mL/min (ref >60)
GFR calc non Af Amer: 59 mL/min — ABNORMAL LOW (ref >60)
Glucose, Bld: 172 mg/dL — ABNORMAL HIGH (ref 70–99)
Potassium: 3.6 mmol/L (ref 3.5–5.1)
Sodium: 136 mmol/L (ref 135–145)

## 2018-02-02 LAB — TSH: TSH: 0.558 u[IU]/mL (ref 0.350–4.500)

## 2018-02-02 MED ORDER — CLOPIDOGREL BISULFATE 75 MG PO TABS: 75.0000 mg | ORAL_TABLET | Freq: Every day | ORAL | 4 refills | Status: DC

## 2018-02-02 MED ORDER — ISOSORBIDE MONONITRATE ER 60 MG PO TB24: 60.0000 mg | ORAL_TABLET | Freq: Every day | ORAL | 4 refills | Status: DC

## 2018-02-02 MED ORDER — FINASTERIDE 5 MG PO TABS: 5.0000 mg | ORAL_TABLET | Freq: Every day | ORAL | 4 refills | Status: AC

## 2018-02-02 MED ORDER — HYDROCORTISONE 1 % EX OINT: TOPICAL_OINTMENT | Freq: Two times a day (BID) | CUTANEOUS | 0 refills | Status: DC

## 2018-02-02 MED FILL — ISOSORBIDE MN ER 60 MG TAB: 60 | 30 days supply | Qty: 30 | Fill #0 | Status: TO

## 2018-02-02 MED FILL — HYDROCORTISONE 1% OINTMENT: 1 | 7 days supply | Qty: 28 | Fill #0

## 2018-02-02 MED FILL — FINASTERIDE 5 MG TABLET: 5 | 30 days supply | Qty: 30 | Fill #0 | Status: TO

## 2018-02-02 MED FILL — CLOPIDOGREL 75 MG TABLET: 75 | 30 days supply | Qty: 30 | Fill #0 | Status: TO

## 2018-02-02 NOTE — Progress Notes (Signed)
CARDIAC REHAB PHASE I   PRE:  Rate/Rhythm: 79 SR    BP: sitting 142/77    SaO2: 100 RA  MODE:  Ambulation: 370 ft   POST:  Rate/Rhythm: 89 SR    BP: sitting 156/76     SaO2: 100 RA  Pt stiff getting out of bed, needed a few minutes to get his balance and stretch out. Stated groin was ok. I used gait belt assist initially however after 200 ft, he was able to be independent. Denied CP, felt well. To recliner. Ed completed. Pt understands importance of Plavix although we discussed talking with urologist and Revankar regarding bladder surg.  Will refer to [facility] (actually already referred). Pt eager to attend.  [phone]  Harriet Masson CES, ACSM 02/02/2018 10:04 AM

## 2018-02-02 NOTE — Progress Notes (Addendum)
Patient had a venous sheath in his right femoral vein. Site appeared level 1 at the time of removal. Pressure was held for 20 minutes with minimal oozing. Site still appeared level 1 after completion of sheath removal. Patient was educated on bleeding risks and holding pressure when coughing and sneezing.

## 2018-02-02 NOTE — Progress Notes (Signed)
Pt wheeled out by NT. Vitals signs stable and no complaints prior to departure. All belongings sent with patient.

## 2018-02-02 NOTE — Progress Notes (Signed)
Progress Note  Patient Name: Austin Malone Date of Encounter: 02/02/2018  Primary Cardiologist: Garwin Brothersajan R Revankar, MD   Subjective   Did very well last night. No chest pain. IV Ntg discontinued.   Inpatient Medications    Scheduled Meds: . alfuzosin  10 mg Oral Daily  . aspirin EC  81 mg Oral Daily  . bethanechol  50 mg Oral TID  . carvedilol  6.25 mg Oral BID WC  . clopidogrel  75 mg Oral Daily  . docusate sodium  100 mg Oral Daily  . enoxaparin (LOVENOX) injection  40 mg Subcutaneous Q24H  . finasteride  5 mg Oral Daily  . furosemide  20 mg Oral Daily  . hydrocortisone   Topical BID  . isosorbide mononitrate  60 mg Oral Daily  . latanoprost  1 drop Both Eyes QHS  . linaclotide  145 mcg Oral QAC breakfast  . pantoprazole  40 mg Oral Daily  . ranolazine  1,000 mg Oral BID  . sodium chloride flush  3 mL Intravenous Q12H   Continuous Infusions: . sodium chloride    . nitroGLYCERIN Stopped (02/02/18 0617)   PRN Meds: sodium chloride, acetaminophen, ALPRAZolam, nitroGLYCERIN, ondansetron (ZOFRAN) IV, sodium chloride flush   Vital Signs    Vitals:   02/02/18 0645 02/02/18 0700 02/02/18 0715 02/02/18 0730  BP: 136/74 133/73 (!) 143/80 (!) 142/77  Pulse: 79 79 74 76  Resp: (!) 21 19 17 16   Temp:      TempSrc:      SpO2: 97% 98% 98% 98%  Weight:      Height:        Intake/Output Summary (Last 24 hours) at 02/02/2018 0802 Last data filed at 02/02/2018 0700 Gross per 24 hour  Intake 1391.18 ml  Output 2275 ml  Net -883.82 ml   Filed Weights   01/31/18 0224 02/01/18 0623 02/02/18 0600  Weight: 89 kg 87.4 kg 86.6 kg   Physical Exam   General: Well developed, well nourished, NAD Skin: Warm, dry, intact  Head: Normocephalic, atraumatic, clear, moist mucus membranes. Neck: Negative for carotid bruits. No JVD Lungs:Clear to ausculation bilaterally. No wheezes, rales, or rhonchi. Breathing is unlabored. Cardiovascular: RRR with S1 S2. No murmurs, rubs,  gallops, or LV heave appreciated. Abdomen: Soft, non-tender, non-distended with normoactive bowel sounds. No obvious abdominal masses. MSK: Strength and tone appear normal for age. 5/5 in all extremities Extremities: tr BLE edema. No clubbing or cyanosis. DP/PT pulses 2+ bilaterally. Left radial and right femoral cath sites without hematoma. Neuro: Alert and oriented. No focal deficits. No facial asymmetry. MAE spontaneously. Psych: Responds to questions appropriately with normal affect.    Labs    Chemistry Recent Labs  Lab 01/31/18 0346 02/01/18 0415 02/01/18 1942 02/02/18 0341  NA 136 136  --  136  K 4.1 4.0  --  3.6  CL 103 104  --  106  CO2 22 21*  --  21*  GLUCOSE 132* 104*  --  172*  BUN 14 12  --  16  CREATININE 1.08 1.14 1.06 1.17  CALCIUM 8.6* 8.7*  --  8.5*  PROT 5.7*  --   --   --   ALBUMIN 3.2*  --   --   --   AST 20  --   --   --   ALT 14  --   --   --   ALKPHOS 34*  --   --   --   BILITOT  0.6  --   --   --   GFRNONAA >60 >60 >60 59*  GFRAA >60 >60 >60 >60  ANIONGAP 11 11  --  9     Hematology Recent Labs  Lab 02/01/18 0415 02/01/18 1942 02/02/18 0341  WBC 6.8 8.0 11.2*  RBC 3.38* 3.49* 3.20*  HGB 10.8* 11.1* 10.6*  HCT 32.7* 33.2* 30.8*  MCV 96.7 95.1 96.3  MCH 32.0 31.8 33.1  MCHC 33.0 33.4 34.4  RDW 13.2 13.2 13.3  PLT 197 223 214    Cardiac Enzymes Recent Labs  Lab 01/31/18 0346 01/31/18 0922 01/31/18 1536  TROPONINI <0.03 <0.03 <0.03   No results for input(s): TROPIPOC in the last 168 hours.   BNPNo results for input(s): BNP, PROBNP in the last 168 hours.   DDimer No results for input(s): DDIMER in the last 168 hours.   Radiology    No results found.  Telemetry    02/01/2018 NSR- Personally Reviewed  ECG    NSR with nonspecific ST-T wave abnormality. Personally Reviewed  Cardiac Studies   Cardiac cath 08/2017 Conclusions: 1. Severe native coronary artery disease, as detailed below. 2. Widely patent LIMA to  LAD. 3. Patent SVG to RPDA with patent stents and mild to moderate, nonobstructive disease. 4. Chronically occluded SVG to diagonal and SVG to OM. 5. Mildly elevated left ventricular filling pressure. 6. Focal basal inferior hypokinesis with otherwise preserved left ventricular systolic function.  Recommendations: 1. Based on outside cath reports, I suspect that progression of native RCA disease may be to blame for the patient's chest pain and mild troponin elevation. I favor medical therapy given heavy calcification of the artery. If he continues to have recurrent angina, PCI with atherectomy will need to be considered, as the RCA continuation and PL branches are not supplied by the SVG to RPDA. I will start isosorbide mononitrate, which can be uptitrated as blood pressure allows. 2. Medical therapy for NSTEMI with at least 12 months of dual antiplatelet therapy. Will start heparin infusion and complete 24 to 48 hours of IV heparin. 3. Hold diuresis today. Consider restarting gentle diuresis again tomorrow if renal function is stable.  Recommend uninterrupted dual antiplatelet therapy with Aspirin 81mg  daily and Clopidogrel 75mg  dailyfor a minimum of 12 months (ACS - Class I recommendation). Diagnostic  Dominance: Right    Echo 08/22/17  Study Conclusions  - Left ventricle: The cavity size was mildly dilated. Wall thickness was normal. Systolic function was normal. The estimated ejection fraction was in the range of 55% to 60%. Wall motion was normal; there were no regional wall motion abnormalities. Doppler parameters are consistent with abnormal left ventricular relaxation (grade 1 diastolic dysfunction). - Aortic valve: There was mild regurgitation. - Mitral valve: There was mild regurgitation. - Left atrium: The atrium was mildly dilated.  Impressions:  - Definity used; normal LV systolic function; mild diastolic dysfunction; mild LVE; mild AI and MR; mild  LAE.  Patient Profile     78 y.o. male with a hx of CAD s/p CABG with graft stenosis and prior PCIs in SVG to PDA and chronically occluded VG to diag and VG to OM and patent LIMA,  though on cath with medical therapy, HLD, HTN, hx MI, PUD and chronic foley catheter who is being seen today for the evaluation of chest pain at the request of Dr. Rinaldo RatelKadolph.  Assessment & Plan    1.  Unstable angina with history of CAD s/p CABG in 2008: -Patient with a  known history of CAD with severe right coronary artery disease which is significantly calcified. Plan initially was for continued medical therapy and to consider PCI if recurrent angina -Patient presented 01/31/2018 with unstable angina. Repeat cardiac cath stable from August. He underwent atherectomy and stenting of the mid and distal RCA. Excellent result. No chest pain.  -Continue ASA 81, carvedilol 6.25 mg, Plavix 75, Imdur 60 mg, Ranexa 1000 mg - will ambulate today. If stable plan DC home. Will arrange follow up with Dr. Tomie China.   2.  HTN: -Stable -Continue carvedilol 6.25 mg  3.  HLD: -Stable,LDL 09/18/2017, 92 -Statin intolerant. Need to consider a PCSK 9 inhibitor as outpatient.   4.  BLE: -Placed on Lasix 20 mg with stabilization  5.  Chronic Foley: -Status post prostate surgery 09/2017>>> upcoming trial without Foley, if failed therapy then suprapubic catheter per urology  6.  Right lower extremity ulcer: -Wound care consultation 01/31/2018, per internal medicine and WOC team  For questions or updates, please contact   Please consult [email or web address] for contact info under Cardiology/STEMI.   Glenard Keesling [place], MDFACC 02/02/2018 8:02 AM

## 2018-02-02 NOTE — Care Management Note (Signed)
Case Management Note  Patient Details  Name: Austin Malone MRN: [id number] Date of Birth: 1940/03/21  Subjective/Objective:   78 yo male presented from OSH with CP; s/p cath with medical therapy recommended.             Action/Plan: CM met with patient to discuss dispositional needs. Patient states living at home and being the primary caregiver of his step-daughter and being independent with his ADLs. PCP verified as Dr. Jannette Fogo; pharmacy of choice: [facility], [place]. Patient has active health insurance with Rx coverage with [facility], but is unable to afford any of his prescriptions at discharge. CM informed by [facility], that assistance was previously provided and patient doesn't qualify for MATCH d/t him having insurance. CM requested petty cash for $12.35 to cover patients medications, with Rx to be provided by [facility] prior to patient transitioning home. Patient indicated his Rushie Goltz will provide him with transportation home. No further needs from CM.   Expected Discharge Date:  02/02/18               Expected Discharge Plan:  Home/Self Care  In-House Referral:  NA  Discharge planning Services  CM Consult, Medication Assistance(Petty Cash for Rxs)  Post Acute Care Choice:  NA Choice offered to:  NA  DME Arranged:  N/A DME Agency:  NA  HH Arranged:  NA HH Agency:  NA  Status of Service:  Completed, signed off  If discussed at [place] of Stay Meetings, dates discussed:    Additional Comments:  Midge Minium RN, BSN, NCM-BC, ACM-RN [phone] 02/02/2018, 10:37 AM

## 2018-02-02 NOTE — Discharge Summary (Signed)
Physician Discharge Summary   Patient ID: Austin Malone MRN: [id number] DOB/AGE: [birth year] 78 y.o.  Admit date: 01/31/2018 Discharge date: 02/02/2018  Primary Care Physician:  Shelbie AmmonsHaque, Imran P, MD   Recommendations for Outpatient Follow-up:  1. Follow up with PCP in 1-2 weeks 2. Recommend patient be referred to dermatology in his area to follow-up on non-healing chronic wound on the right medial malleolus  Home Health: None  Equipment/Devices:   Discharge Condition: stable  CODE STATUS:DNR   Diet recommendation: Heart healthy diet   Discharge Diagnoses:    . Unstable angina (HCC) . CAD (coronary artery disease) . Hypertension . Chronic diastolic CHF (congestive heart failure) (HCC) Chronic nonhealing right lower extremity wound   Consults: Cardiology Wound care    Allergies:   Allergies  Allergen Reactions  . Codeine Other (See Comments)    unknown  . Lisinopril Cough  . Contrast Media [Iodinated Diagnostic Agents] Hives and Rash     DISCHARGE MEDICATIONS: Allergies as of 02/02/2018      Reactions   Codeine Other (See Comments)   unknown   Lisinopril Cough   Contrast Media [iodinated Diagnostic Agents] Hives, Rash      Medication List    TAKE these medications   alfuzosin 10 MG 24 hr tablet Commonly known as:  UROXATRAL Take 1 tablet by mouth daily.   ALPRAZolam 0.5 MG tablet Commonly known as:  XANAX Take 0.5 mg by mouth 2 (two) times daily as needed for anxiety or sleep.   aspirin EC 81 MG tablet Take 81 mg by mouth daily.   bethanechol 50 MG tablet Commonly known as:  URECHOLINE Take 50 mg by mouth 3 (three) times daily.   carvedilol 6.25 MG tablet Commonly known as:  COREG Take 1 tablet by mouth 2 (two) times daily.   clopidogrel 75 MG tablet Commonly known as:  PLAVIX Take 1 tablet (75 mg total) by mouth daily.   docusate sodium 100 MG capsule Commonly known as:  COLACE Take 100 mg by mouth daily.   dorzolamide-timolol 22.3-6.8  MG/ML ophthalmic solution Commonly known as:  COSOPT Place 1 drop into both eyes 2 (two) times daily.   finasteride 5 MG tablet Commonly known as:  PROSCAR Take 1 tablet (5 mg total) by mouth daily.   folic acid 1 MG tablet Commonly known as:  FOLVITE Take 1 tablet (1 mg total) by mouth daily.   furosemide 20 MG tablet Commonly known as:  LASIX Take 20 mg by mouth daily as needed for fluid.   hydrocortisone 1 % ointment Apply topically 2 (two) times daily. Apply to medial right malleolus in a thin layer twice daily   isosorbide mononitrate 60 MG 24 hr tablet Commonly known as:  IMDUR Take 1 tablet (60 mg total) by mouth daily.   LINZESS 145 MCG Caps capsule Generic drug:  linaclotide Take 145 mcg by mouth daily before breakfast.   Melatonin 5 MG Tabs Take 40 mg by mouth at bedtime.   nitroGLYCERIN 0.4 MG SL tablet Commonly known as:  NITROSTAT Place 0.4 mg under the tongue every 5 (five) minutes as needed for chest pain.   ondansetron 8 MG tablet Commonly known as:  ZOFRAN Take 8 mg by mouth 2 (two) times daily.   pantoprazole 40 MG tablet Commonly known as:  PROTONIX Take 40 mg by mouth daily.   RANEXA 1000 MG SR tablet Generic drug:  ranolazine Take 1,000 mg by mouth 2 (two) times daily.   TRAVATAN Z 0.004 %  Soln ophthalmic solution Generic drug:  Travoprost (BAK Free) Place 1 drop into both eyes 2 (two) times daily.            Discharge Care Instructions  (From admission, onward)         Start     Ordered   02/02/18 0000  Discharge wound care:    Comments:  Right medial malleolus:  Cleanse with soap and water, pat dry.  Apply hydrocortisone cream in a thin layer. Leave open to air.   02/02/18 1016           Brief H and P: For complete details please refer to admission H and P, but in brief  Patient is [age] year old male with history of CAD status post CABG, hypertension, BPH presented to ED with chest pain.  Per patient he has been having  increasingly frequent episodes of chest pain usually at night, symptoms resolving with sublingual nitro, sometimes taking 5 times per night.  Patient was admitted for chest pain rule out.   Hospital Course:   Unstable angina [facility](HCC) with known history of CAD status post CABG -Presented with increasingly frequent chest pain episodes, resolved with nitroglycerin sublingual -Patient had a cardiac cath in 08/2017, showed chronic SVG to diagonal and SVG to OM occlusion, other grafts patent, stents patent, severe native artery disease with progression of RCA that was heavily calcified. -Serial troponins were negative.  Cardiology was consulted  -Patient underwent cardiac cath which showed severe three-vessel coronary artery disease, mid RCA stenosis more severe 90 to 95% then in 08/2017 otherwise no significant change is noted.  Successful PCI to mid RCA and distal RCA. -Full report below -Patient was seen by cardiology, recommended dual antiplatelet therapy with aspirin and Plavix for at least 12 months or ideally longer.  Outpatient follow-up with his cardiologist, Dr. Consuello Bossierevanker.     Hypertension - continue Coreg  Hyperlipidemia LDL 92, statin intolerant May consider PCSK9 inhibitor as outpatient  Bilateral lower extremity edema, history of chronic diastolic CHF -Currently euvolemic, continue Lasix as needed  Chronic Foley -History of prostate surgery 09/2017, continue Foley, he has upcoming voiding trial with urology   Right lower extremity ulcer -Wound care was consulted chronic nonhealing wound on the right medial malleolus, had a tick bite approximately 10 years ago where the area blistered and eventually healed.  6 months ago the same area began to crust and flake. -Recommended twice daily hydrocortisone cream for 3 weeks applied after washing with soap and water and drying. -Recommend outpatient follow-up with dermatology if not resolved   Day of Discharge S: Doing well, no acute  complaints, no chest pain.  BP (!) 159/80   Pulse 80   Temp (!) 97.5 F (36.4 C) (Oral)   Resp (!) 22   Ht 5\' 10"  (1.778 m)   Wt 86.6 kg   SpO2 100%   BMI 27.39 kg/m   Physical Exam: General: Alert and awake oriented x3 not in any acute distress. HEENT: anicteric sclera, pupils reactive to light and accommodation CVS: S1-S2 clear no murmur rubs or gallops Chest: clear to auscultation bilaterally, no wheezing rales or rhonchi Abdomen: soft nontender, nondistended, normal bowel sounds Extremities: no cyanosis, clubbing or edema noted bilaterally Neuro: Cranial nerves II-XII intact, no focal neurological deficits   The results of significant diagnostics from this hospitalization (including imaging, microbiology, ancillary and laboratory) are listed below for reference.      Procedures/Studies:  Cardiac cath 02/01/2018 FINDINGS: 1. Severe 3-vessel native CAD; mid  RCA stenosis is more severe (90-95%) than in 08/2017.  Otherwise, no significant change is noted. 2. Patent SVG-rPDA with stable mild to moderate disease. 3. Patient LIMA-LAD. 4. Known occlusions of SVG-D and SVG-OM; grafts were not engaged on today's study. 5. Successful PCI to mid RCA using orbital atherectomy and Synergy 3.5 x 38 mm drug-eluting stent. 6. Successful PCI to distal RCA and r-PL using Synergy 2.75 x 24 mm drug-eluting stent. 7. Successful placement of temporary transvenous pacemaker via the right femoral vein.  RECOMMENDATIONS: 1. DAPT with ASA and clopidogrel for at least 12 months, ideally longer. 2. Aggressive secondary prevention.  No results found.    LAB RESULTS: Basic Metabolic Panel: Recent Labs  Lab 02/01/18 0415 02/01/18 1942 02/02/18 0341  NA 136  --  136  K 4.0  --  3.6  CL 104  --  106  CO2 21*  --  21*  GLUCOSE 104*  --  172*  BUN 12  --  16  CREATININE 1.14 1.06 1.17  CALCIUM 8.7*  --  8.5*   Liver Function Tests: Recent Labs  Lab 01/31/18 0346  AST 20  ALT 14   ALKPHOS 34*  BILITOT 0.6  PROT 5.7*  ALBUMIN 3.2*   No results for input(s): LIPASE, AMYLASE in the last 168 hours. No results for input(s): AMMONIA in the last 168 hours. CBC: Recent Labs  Lab 01/31/18 0346  02/01/18 1942 02/02/18 0341  WBC 5.8   < > 8.0 11.2*  NEUTROABS 2.6  --   --   --   HGB 10.9*   < > 11.1* 10.6*  HCT 33.0*   < > 33.2* 30.8*  MCV 97.1   < > 95.1 96.3  PLT 208   < > 223 214   < > = values in this interval not displayed.   Cardiac Enzymes: Recent Labs  Lab 01/31/18 0922 01/31/18 1536  TROPONINI <0.03 <0.03   BNP: Invalid input(s): POCBNP CBG: Recent Labs  Lab 02/01/18 1408  GLUCAP 155*      Disposition and Follow-up: Discharge Instructions    Amb Referral to Cardiac Rehabilitation   Complete by:  As directed    To [place]   Diagnosis:   Coronary Stents PTCA     Diet - low sodium heart healthy   Complete by:  As directed    Discharge wound care:   Complete by:  As directed    Right medial malleolus:  Cleanse with soap and water, pat dry.  Apply hydrocortisone cream in a thin layer. Leave open to air.   Increase activity slowly   Complete by:  As directed        DISPOSITION: home    DISCHARGE FOLLOW-UP Follow-up Information    Revankar, Aundra Dubin, MD. Schedule an appointment as soon as possible for a visit in 2 week(s).   Specialty:  Cardiology Contact information: [address] [place] [address] [phone]        Shelbie Ammons, MD. Schedule an appointment as soon as possible for a visit in 2 week(s).   Specialty:  Internal Medicine Contact information: [address] [place] [address] [phone]            Time coordinating discharge:    Signed:   Thad Ranger M.D. [facility] 02/02/2018, 10:17 AM Pager: [phone]

## 2018-02-03 ENCOUNTER — Other Ambulatory Visit: Payer: Self-pay

## 2018-02-03 ENCOUNTER — Inpatient Hospital Stay (HOSPITAL_COMMUNITY)
Admission: AD | Admit: 2018-02-03 | Discharge: 2018-02-06 | DRG: 303 | Disposition: A | Discharge status: Home / Self Care | Payer: Medicare HMO | Source: Other Acute Inpatient Hospital | Attending: Cardiology | Admitting: Cardiology

## 2018-02-03 ENCOUNTER — Telehealth: Payer: Self-pay | Admitting: *Deleted

## 2018-02-03 ENCOUNTER — Encounter (HOSPITAL_COMMUNITY): Payer: Self-pay

## 2018-02-03 DIAGNOSIS — I110 Hypertensive heart disease with heart failure: Secondary | ICD-10-CM | POA: Diagnosis present

## 2018-02-03 DIAGNOSIS — I319 Disease of pericardium, unspecified: Secondary | ICD-10-CM | POA: Diagnosis present

## 2018-02-03 DIAGNOSIS — I5032 Chronic diastolic (congestive) heart failure: Secondary | ICD-10-CM | POA: Diagnosis present

## 2018-02-03 DIAGNOSIS — R6883 Chills (without fever): Secondary | ICD-10-CM | POA: Diagnosis not present

## 2018-02-03 DIAGNOSIS — N400 Enlarged prostate without lower urinary tract symptoms: Secondary | ICD-10-CM | POA: Diagnosis present

## 2018-02-03 DIAGNOSIS — I2510 Atherosclerotic heart disease of native coronary artery without angina pectoris: Secondary | ICD-10-CM | POA: Diagnosis present

## 2018-02-03 DIAGNOSIS — I480 Paroxysmal atrial fibrillation: Secondary | ICD-10-CM | POA: Diagnosis present

## 2018-02-03 DIAGNOSIS — K219 Gastro-esophageal reflux disease without esophagitis: Secondary | ICD-10-CM | POA: Diagnosis present

## 2018-02-03 DIAGNOSIS — I10 Essential (primary) hypertension: Secondary | ICD-10-CM | POA: Diagnosis present

## 2018-02-03 DIAGNOSIS — Z8249 Family history of ischemic heart disease and other diseases of the circulatory system: Secondary | ICD-10-CM

## 2018-02-03 DIAGNOSIS — R0789 Other chest pain: Secondary | ICD-10-CM | POA: Diagnosis present

## 2018-02-03 DIAGNOSIS — Z7982 Long term (current) use of aspirin: Secondary | ICD-10-CM

## 2018-02-03 DIAGNOSIS — I252 Old myocardial infarction: Secondary | ICD-10-CM | POA: Diagnosis not present

## 2018-02-03 DIAGNOSIS — I300 Acute nonspecific idiopathic pericarditis: Secondary | ICD-10-CM

## 2018-02-03 DIAGNOSIS — S81801A Unspecified open wound, right lower leg, initial encounter: Secondary | ICD-10-CM | POA: Diagnosis present

## 2018-02-03 DIAGNOSIS — I25810 Atherosclerosis of coronary artery bypass graft(s) without angina pectoris: Secondary | ICD-10-CM | POA: Diagnosis present

## 2018-02-03 DIAGNOSIS — F419 Anxiety disorder, unspecified: Secondary | ICD-10-CM | POA: Diagnosis present

## 2018-02-03 DIAGNOSIS — H409 Unspecified glaucoma: Secondary | ICD-10-CM | POA: Diagnosis present

## 2018-02-03 DIAGNOSIS — Z951 Presence of aortocoronary bypass graft: Secondary | ICD-10-CM | POA: Diagnosis not present

## 2018-02-03 DIAGNOSIS — Z87891 Personal history of nicotine dependence: Secondary | ICD-10-CM

## 2018-02-03 DIAGNOSIS — R079 Chest pain, unspecified: Secondary | ICD-10-CM | POA: Diagnosis not present

## 2018-02-03 DIAGNOSIS — Z955 Presence of coronary angioplasty implant and graft: Secondary | ICD-10-CM | POA: Diagnosis not present

## 2018-02-03 DIAGNOSIS — Z8711 Personal history of peptic ulcer disease: Secondary | ICD-10-CM

## 2018-02-03 DIAGNOSIS — E7800 Pure hypercholesterolemia, unspecified: Secondary | ICD-10-CM | POA: Diagnosis present

## 2018-02-03 DIAGNOSIS — Z885 Allergy status to narcotic agent status: Secondary | ICD-10-CM

## 2018-02-03 DIAGNOSIS — Z79890 Hormone replacement therapy (postmenopausal): Secondary | ICD-10-CM

## 2018-02-03 DIAGNOSIS — I25119 Atherosclerotic heart disease of native coronary artery with unspecified angina pectoris: Principal | ICD-10-CM | POA: Diagnosis present

## 2018-02-03 DIAGNOSIS — R7989 Other specified abnormal findings of blood chemistry: Secondary | ICD-10-CM | POA: Diagnosis present

## 2018-02-03 DIAGNOSIS — Z7902 Long term (current) use of antithrombotics/antiplatelets: Secondary | ICD-10-CM

## 2018-02-03 DIAGNOSIS — Z66 Do not resuscitate: Secondary | ICD-10-CM | POA: Diagnosis present

## 2018-02-03 DIAGNOSIS — Z79899 Other long term (current) drug therapy: Secondary | ICD-10-CM

## 2018-02-03 DIAGNOSIS — Z888 Allergy status to other drugs, medicaments and biological substances status: Secondary | ICD-10-CM

## 2018-02-03 DIAGNOSIS — Z91041 Radiographic dye allergy status: Secondary | ICD-10-CM

## 2018-02-03 DIAGNOSIS — E782 Mixed hyperlipidemia: Secondary | ICD-10-CM | POA: Diagnosis not present

## 2018-02-03 DIAGNOSIS — I25110 Atherosclerotic heart disease of native coronary artery with unstable angina pectoris: Secondary | ICD-10-CM

## 2018-02-03 DIAGNOSIS — E785 Hyperlipidemia, unspecified: Secondary | ICD-10-CM | POA: Diagnosis present

## 2018-02-03 DIAGNOSIS — Z833 Family history of diabetes mellitus: Secondary | ICD-10-CM

## 2018-02-03 HISTORY — DX: Disease of pericardium, unspecified: I31.9

## 2018-02-03 LAB — TROPONIN I: Troponin I: 1.55 ng/mL (ref <0.03)

## 2018-02-03 LAB — BRAIN NATRIURETIC PEPTIDE: B Natriuretic Peptide: 369.7 pg/mL — ABNORMAL HIGH (ref 0.0–100.0)

## 2018-02-03 LAB — MAGNESIUM: Magnesium: 2 mg/dL (ref 1.7–2.4)

## 2018-02-03 MED ORDER — NITROGLYCERIN 0.4 MG SL SUBL
0.4000 mg | SUBLINGUAL_TABLET | SUBLINGUAL | Status: DC | PRN
  Administered 2018-02-04: 0.4 mg via SUBLINGUAL
  Filled 2018-02-03: qty 1

## 2018-02-03 MED ORDER — ALPRAZOLAM 0.5 MG PO TABS
0.5000 mg | ORAL_TABLET | Freq: Two times a day (BID) | ORAL | Status: DC | PRN
  Administered 2018-02-03 – 2018-02-05 (×3): 0.5 mg via ORAL
  Filled 2018-02-03 (×3): qty 1

## 2018-02-03 MED ORDER — ISOSORBIDE MONONITRATE ER 60 MG PO TB24
60.0000 mg | ORAL_TABLET | Freq: Every day | ORAL | Status: DC
  Administered 2018-02-04 – 2018-02-06 (×3): 60 mg via ORAL
  Filled 2018-02-03 (×3): qty 1

## 2018-02-03 MED ORDER — FINASTERIDE 5 MG PO TABS
5.0000 mg | ORAL_TABLET | Freq: Every day | ORAL | Status: DC
  Administered 2018-02-04 – 2018-02-06 (×3): 5 mg via ORAL
  Filled 2018-02-03 (×3): qty 1

## 2018-02-03 MED ORDER — ONDANSETRON HCL 4 MG/2ML IJ SOLN
4.0000 mg | Freq: Four times a day (QID) | INTRAMUSCULAR | Status: DC | PRN
  Administered 2018-02-04 – 2018-02-06 (×3): 4 mg via INTRAVENOUS
  Filled 2018-02-03 (×3): qty 2

## 2018-02-03 MED ORDER — NITROGLYCERIN 0.4 MG SL SUBL: 0.4000 mg | SUBLINGUAL_TABLET | SUBLINGUAL | Status: DC | PRN

## 2018-02-03 MED ORDER — BETHANECHOL CHLORIDE 25 MG PO TABS
50.0000 mg | ORAL_TABLET | Freq: Three times a day (TID) | ORAL | Status: DC
  Administered 2018-02-03 – 2018-02-06 (×7): 50 mg via ORAL
  Filled 2018-02-03 (×10): qty 2

## 2018-02-03 MED ORDER — HEPARIN (PORCINE) 25000 UT/250ML-% IV SOLN
1200.0000 [IU]/h | INTRAVENOUS | Status: AC
  Administered 2018-02-04: 1200 [IU]/h via INTRAVENOUS
  Filled 2018-02-03: qty 250

## 2018-02-03 MED ORDER — CARVEDILOL 6.25 MG PO TABS
6.2500 mg | ORAL_TABLET | Freq: Two times a day (BID) | ORAL | Status: DC
  Administered 2018-02-03 – 2018-02-06 (×6): 6.25 mg via ORAL
  Filled 2018-02-03 (×6): qty 1

## 2018-02-03 MED ORDER — ONDANSETRON HCL 4 MG PO TABS
8.0000 mg | ORAL_TABLET | Freq: Two times a day (BID) | ORAL | Status: DC
  Administered 2018-02-03 – 2018-02-06 (×6): 8 mg via ORAL
  Filled 2018-02-03 (×6): qty 2

## 2018-02-03 MED ORDER — HYDROCORTISONE 1 % EX OINT
TOPICAL_OINTMENT | Freq: Two times a day (BID) | CUTANEOUS | Status: DC
  Administered 2018-02-03 – 2018-02-04 (×3): via TOPICAL
  Administered 2018-02-05: 1 via TOPICAL
  Filled 2018-02-03: qty 28

## 2018-02-03 MED ORDER — ALFUZOSIN HCL ER 10 MG PO TB24
10.0000 mg | ORAL_TABLET | Freq: Every day | ORAL | Status: DC
  Administered 2018-02-04 – 2018-02-06 (×3): 10 mg via ORAL
  Filled 2018-02-03 (×3): qty 1

## 2018-02-03 MED ORDER — FUROSEMIDE 20 MG PO TABS: 20.0000 mg | ORAL_TABLET | Freq: Every day | ORAL | Status: DC | PRN

## 2018-02-03 MED ORDER — RANOLAZINE ER 500 MG PO TB12
1000.0000 mg | ORAL_TABLET | Freq: Two times a day (BID) | ORAL | Status: DC
  Administered 2018-02-03 – 2018-02-06 (×6): 1000 mg via ORAL
  Filled 2018-02-03 (×6): qty 2

## 2018-02-03 MED ORDER — NITROGLYCERIN IN D5W 200-5 MCG/ML-% IV SOLN: 0.0000 ug/min | INTRAVENOUS | Status: DC

## 2018-02-03 MED ORDER — CLOPIDOGREL BISULFATE 75 MG PO TABS
75.0000 mg | ORAL_TABLET | Freq: Every day | ORAL | Status: DC
  Administered 2018-02-03 – 2018-02-06 (×4): 75 mg via ORAL
  Filled 2018-02-03 (×4): qty 1

## 2018-02-03 MED ORDER — PANTOPRAZOLE SODIUM 40 MG PO TBEC
40.0000 mg | DELAYED_RELEASE_TABLET | Freq: Every day | ORAL | Status: DC
  Administered 2018-02-04 – 2018-02-06 (×3): 40 mg via ORAL
  Filled 2018-02-03 (×3): qty 1

## 2018-02-03 MED ORDER — DOCUSATE SODIUM 100 MG PO CAPS
100.0000 mg | ORAL_CAPSULE | Freq: Every day | ORAL | Status: DC
  Administered 2018-02-05 – 2018-02-06 (×2): 100 mg via ORAL
  Filled 2018-02-03 (×2): qty 1

## 2018-02-03 MED ORDER — COLCHICINE 0.6 MG PO TABS
0.6000 mg | ORAL_TABLET | Freq: Two times a day (BID) | ORAL | Status: DC
  Administered 2018-02-03 – 2018-02-06 (×6): 0.6 mg via ORAL
  Filled 2018-02-03 (×6): qty 1

## 2018-02-03 MED ORDER — ASPIRIN EC 81 MG PO TBEC
81.0000 mg | DELAYED_RELEASE_TABLET | Freq: Every day | ORAL | Status: DC
  Administered 2018-02-04 – 2018-02-06 (×3): 81 mg via ORAL
  Filled 2018-02-03 (×3): qty 1

## 2018-02-03 MED ORDER — LATANOPROST 0.005 % OP SOLN
1.0000 [drp] | Freq: Every day | OPHTHALMIC | Status: DC
  Administered 2018-02-03 – 2018-02-04 (×2): 1 [drp] via OPHTHALMIC
  Filled 2018-02-03: qty 2.5

## 2018-02-03 MED ORDER — DORZOLAMIDE HCL-TIMOLOL MAL 2-0.5 % OP SOLN
1.0000 [drp] | Freq: Two times a day (BID) | OPHTHALMIC | Status: DC
  Administered 2018-02-03 – 2018-02-06 (×5): 1 [drp] via OPHTHALMIC
  Filled 2018-02-03: qty 10

## 2018-02-03 MED ORDER — FUROSEMIDE 10 MG/ML IJ SOLN
40.0000 mg | Freq: Once | INTRAMUSCULAR | Status: AC
  Administered 2018-02-03: 40 mg via INTRAVENOUS
  Filled 2018-02-03: qty 4

## 2018-02-03 MED ORDER — LINACLOTIDE 145 MCG PO CAPS
145.0000 ug | ORAL_CAPSULE | Freq: Every day | ORAL | Status: DC
  Administered 2018-02-04 – 2018-02-06 (×3): 145 ug via ORAL
  Filled 2018-02-03 (×3): qty 1

## 2018-02-03 MED ORDER — ACETAMINOPHEN 325 MG PO TABS
650.0000 mg | ORAL_TABLET | ORAL | Status: DC | PRN
  Filled 2018-02-03: qty 2

## 2018-02-03 NOTE — Telephone Encounter (Signed)
Called patient's home phone and Marylu Lund answered the phone who is not listed on the patient's DPR. When I asked to speak with patient, Marylu Lund explained that patient had been up all night with chest pain. He had taken nitroglycerin and it did not help with his symptoms. Marylu Lund reports that patient is resting now and she will have him call me when he wakes up.  After speaking with Dr. Dulce Sellar, I insisted for Marylu Lund to wake patient up and call 911 immediately to get patient to the hospital to be evaluated as there is concern for possible stent occlusion. Marylu Lund was agreeable and verbalized understanding.

## 2018-02-03 NOTE — Progress Notes (Signed)
ANTICOAGULATION CONSULT NOTE - Initial Consult  Pharmacy Consult for IV heparin Indication: chest pain/ACS  Allergies  Allergen Reactions  . Codeine Other (See Comments)    unknown  . Lisinopril Cough  . Contrast Media [Iodinated Diagnostic Agents] Hives and Rash    Patient Measurements: Height: 5\' 10"  (177.8 cm) Weight: 195 lb 11.2 oz (88.8 kg) IBW/kg (Calculated) : 73 Heparin Dosing Weight: 88.8 kg  Vital Signs: Temp: 98.6 F (37 C) (01/22 1945) Temp Source: Oral (01/22 1945) BP: 143/81 (01/22 1945) Pulse Rate: 70 (01/22 1945)  Labs: Recent Labs    02/01/18 0415 02/01/18 1942 02/02/18 0341  HGB 10.8* 11.1* 10.6*  HCT 32.7* 33.2* 30.8*  PLT 197 223 214  LABPROT 14.3  --   --   INR 1.12  --   --   HEPARINUNFRC 0.42  --   --   CREATININE 1.14 1.06 1.17    Estimated Creatinine Clearance: 58.4 mL/min (by C-G formula based on SCr of 1.17 mg/dL).   Medical History: Past Medical History:  Diagnosis Date  . Anxiety   . Arthritis   . BPH (benign prostatic hyperplasia)   . CAD (coronary artery disease)   . Cataracts, bilateral   . Enlarged prostate   . GERD (gastroesophageal reflux disease)   . Glaucoma   . Hyperlipidemia   . Hypertension   . Non-ST elevation (NSTEMI) myocardial infarction (HCC) 08/21/2017  . Pneumonia   . PUD (peptic ulcer disease)   . Rotator cuff tear     Medications:  Scheduled:  . [START ON 02/04/2018] alfuzosin  10 mg Oral Daily  . [START ON 02/04/2018] aspirin EC  81 mg Oral Daily  . bethanechol  50 mg Oral TID  . carvedilol  6.25 mg Oral BID  . clopidogrel  75 mg Oral Daily  . colchicine  0.6 mg Oral BID  . [START ON 02/04/2018] docusate sodium  100 mg Oral Daily  . dorzolamide-timolol  1 drop Both Eyes BID  . [START ON 02/04/2018] finasteride  5 mg Oral Daily  . hydrocortisone   Topical BID  . [START ON 02/04/2018] isosorbide mononitrate  60 mg Oral Daily  . latanoprost  1 drop Both Eyes QHS  . [START ON 02/04/2018] linaclotide   145 mcg Oral QAC breakfast  . ondansetron  8 mg Oral BID  . [START ON 02/04/2018] pantoprazole  40 mg Oral Daily  . ranolazine  1,000 mg Oral BID    Assessment: 78 yo male readmitted with chest pain, elevated troponins. Heparin infusion started at [facility] at 1606 PM today at 1100 units/hr.  No overt bleeding or complications noted.  Labs from [facility]: Hgb 10.9, Pltc 204 K= 3.5,  Scr 1.1  Goal of Therapy:  Heparin level 0.3-0.7 units/ml Monitor platelets by anticoagulation protocol: Yes   Plan:  1. Continue IV heparin at current rate. 2. Check heparin level at midnight tonight. 3. Daily heparin level and CBC. 4. F/u plans for d/c heparin if troponins trend down.  Jenetta Downer, [facility] Clinical Pharmacist Phone [phone]  02/03/2018 8:31 PM

## 2018-02-03 NOTE — H&P (Addendum)
Cardiology Admission History and Physical:   Patient ID: Austin Malone MRN: [id number]; DOB: 16-Mar-1940   Admission date: 02/03/2018  Primary Care Provider: Shelbie Ammons, MD Primary Cardiologist: Garwin Brothers, MD  Primary Electrophysiologist:  None   Chief Complaint:  Chest pain   Patient Profile:   Austin Malone is a 78 y.o. male with hx of CAD s/p CABG with graft stenosis and prior PCIs to VG to PDA and chronically occluded VG to diag and VG to OM and patent LIMA,  attempt to manage medically but pt with increasing angina so 02/01/18 had PCI to [facility] with orbital atherectomy and DES and PCI to dRCA and rPL with DES, HLD, HTN, hx MI, PUD and chronic foley catheter, he did well and discharged 02/02/18 He developed acute chest pain today and went to [facility] with elevated troponins.  Transferred to [facility].    History of Present Illness:   Austin Malone with above hx  CAD s/p CABG with graft stenosis and prior PCIs to VG to PDA and chronically occluded VG to diag and VG to OM and patent LIMA,  attempt to manage medically but pt with increasing angina so 02/01/18 had PCI to [facility] with orbital atherectomy and DES and PCI to dRCA and rPL with DES, HLD, HTN, hx MI, PUD and chronic foley catheter, he did well and discharged 02/02/18 He developed acute chest pain today and went to [facility] with elevated troponins.  Transferred to [facility].    He did well post PCI and ambulated on the 21st without chest pain and was ready for discharge.  His admit troponins were neg and none post procedure.  He did have a lot of pain with the procedure.    Today he developed pain during the early hours.  Occurs with deep breath and lying back in bed, improves with sitting up  Different than the pain he presented over the weekend with.  NTG has not helped this pain.  Associated with mild SOB.  He went to [facility]  CXR stable no cardiopulmonary process.  EKG SR LVH and ST depression in V2 but  otherwise no acute changes.  I personally reviewed.  Hgb 10.9 and HCT 31.5 WBC 9.3 plts 204 Na 132, K+ 3.5, BUN 22, Cr 1.10  Troponin I 1.96  Pro BNP 2860  Currently on IV heparin and IV NTG.  No pain currently.  Is hungry.  Has chronic foley.     Past Medical History:  Diagnosis Date  . Anxiety   . Arthritis   . BPH (benign prostatic hyperplasia)   . CAD (coronary artery disease)   . Cataracts, bilateral   . Enlarged prostate   . GERD (gastroesophageal reflux disease)   . Glaucoma   . Hyperlipidemia   . Hypertension   . Non-ST elevation (NSTEMI) myocardial infarction (HCC) 08/21/2017  . Pneumonia   . PUD (peptic ulcer disease)   . Rotator cuff tear     Past Surgical History:  Procedure Laterality Date  . APPENDECTOMY    . CORONARY ANGIOPLASTY WITH STENT PLACEMENT  11/2014   PCI of SVG to PDA x 2 with Xience DES to proximal stenosis, Ultra 5.0 BMS to distal thrombotic lesion  . CORONARY ARTERY BYPASS GRAFT  2008  . CORONARY ATHERECTOMY N/A 02/01/2018   Procedure: CORONARY ATHERECTOMY;  Surgeon: Yvonne Kendall, MD;  Location: [facility] INVASIVE CV LAB;  Service: Cardiovascular;  Laterality: N/A;  . CORONARY STENT INTERVENTION N/A 02/01/2018   Procedure: CORONARY STENT  INTERVENTION;  Surgeon: Yvonne Kendall, MD;  Location: [facility] INVASIVE CV LAB;  Service: Cardiovascular;  Laterality: N/A;  . EYE SURGERY    . INTRAVASCULAR ULTRASOUND/IVUS N/A 02/01/2018   Procedure: Intravascular Ultrasound/IVUS;  Surgeon: Yvonne Kendall, MD;  Location: [facility] INVASIVE CV LAB;  Service: Cardiovascular;  Laterality: N/A;  . LEFT HEART CATH AND CORS/GRAFTS ANGIOGRAPHY N/A 08/21/2017   Procedure: LEFT HEART CATH AND CORS/GRAFTS ANGIOGRAPHY;  Surgeon: Yvonne Kendall, MD;  Location: [facility] INVASIVE CV LAB;  Service: Cardiovascular;  Laterality: N/A;  . LEFT HEART CATH AND CORS/GRAFTS ANGIOGRAPHY N/A 02/01/2018   Procedure: LEFT HEART CATH AND CORS/GRAFTS ANGIOGRAPHY;  Surgeon: Yvonne Kendall, MD;  Location: [facility]  INVASIVE CV LAB;  Service: Cardiovascular;  Laterality: N/A;  . LEG SURGERY    . TONSILLECTOMY       Medications Prior to Admission: Prior to Admission medications   Medication Sig Start Date End Date Taking? Authorizing Provider  alfuzosin (UROXATRAL) 10 MG 24 hr tablet Take 1 tablet by mouth daily.    [provider]  ALPRAZolam Prudy Feeler) 0.5 MG tablet Take 0.5 mg by mouth 2 (two) times daily as needed for anxiety or sleep.  05/10/14   [provider]  aspirin EC 81 MG tablet Take 81 mg by mouth daily.    [provider]  bethanechol (URECHOLINE) 50 MG tablet Take 50 mg by mouth 3 (three) times daily.    [provider]  carvedilol (COREG) 6.25 MG tablet Take 1 tablet by mouth 2 (two) times daily. 09/01/16   [provider]  clopidogrel (PLAVIX) 75 MG tablet Take 1 tablet (75 mg total) by mouth daily. 02/02/18   Rai, Delene Ruffini, MD  docusate sodium (COLACE) 100 MG capsule Take 100 mg by mouth daily.    [provider]  dorzolamide-timolol (COSOPT) 22.3-6.8 MG/ML ophthalmic solution Place 1 drop into both eyes 2 (two) times daily. 01/29/18   [provider]  finasteride (PROSCAR) 5 MG tablet Take 1 tablet (5 mg total) by mouth daily. 02/02/18   Rai, Delene Ruffini, MD  folic acid (FOLVITE) 1 MG tablet Take 1 tablet (1 mg total) by mouth daily. Patient not taking: Reported on 01/31/2018 08/24/17   Jackie Plum, MD  furosemide (LASIX) 20 MG tablet Take 20 mg by mouth daily as needed for fluid.  07/30/17   [provider]  hydrocortisone 1 % ointment Apply topically 2 (two) times daily. Apply to medial right malleolus in a thin layer twice daily 02/02/18   Rai, Delene Ruffini, MD  isosorbide mononitrate (IMDUR) 60 MG 24 hr tablet Take 1 tablet (60 mg total) by mouth daily. 02/02/18   Rai, Delene Ruffini, MD  linaclotide (LINZESS) 145 MCG CAPS capsule Take 145 mcg by mouth daily before breakfast.    [provider]  Melatonin 5 MG  TABS Take 40 mg by mouth at bedtime.    [provider]  nitroGLYCERIN (NITROSTAT) 0.4 MG SL tablet Place 0.4 mg under the tongue every 5 (five) minutes as needed for chest pain.  03/10/16   [provider]  ondansetron (ZOFRAN) 8 MG tablet Take 8 mg by mouth 2 (two) times daily.     [provider]  pantoprazole (PROTONIX) 40 MG tablet Take 40 mg by mouth daily.     [provider]  ranolazine (RANEXA) 1000 MG SR tablet Take 1,000 mg by mouth 2 (two) times daily.  02/25/16   [provider]  TRAVATAN Z 0.004 % SOLN ophthalmic solution Place  1 drop into both eyes 2 (two) times daily.  06/24/17   [provider]     Allergies:    Allergies  Allergen Reactions  . Codeine Other (See Comments)    unknown  . Lisinopril Cough  . Contrast Media [Iodinated Diagnostic Agents] Hives and Rash    Social History:   Social History   Socioeconomic History  . Marital status: Married    Spouse name: Not on file  . Number of children: Not on file  . Years of education: Not on file  . Highest education level: Not on file  Occupational History  . Occupation: Retired  Engineer, productionocial Needs  . Financial resource strain: Not on file  . Food insecurity:    Worry: Not on file    Inability: Not on file  . Transportation needs:    Medical: Not on file    Non-medical: Not on file  Tobacco Use  . Smoking status: Former Games developermoker  . Smokeless tobacco: Never Used  Substance and Sexual Activity  . Alcohol use: Yes    Alcohol/week: 21.0 standard drinks    Types: 21 Cans of beer per week  . Drug use: Yes    Types: Marijuana  . Sexual activity: Not on file  Lifestyle  . Physical activity:    Days per week: Not on file    Minutes per session: Not on file  . Stress: Not on file  Relationships  . Social connections:    Talks on phone: Not on file    Gets together: Not on file    Attends religious service: Not on file    Active member of club or organization:  Not on file    Attends meetings of clubs or organizations: Not on file    Relationship status: Not on file  . Intimate partner violence:    Fear of current or ex partner: Not on file    Emotionally abused: Not on file    Physically abused: Not on file    Forced sexual activity: Not on file  Other Topics Concern  . Not on file  Social History Narrative   [place].    Family History:   The patient's family history includes Diabetes in his mother; Heart attack in his brother.    ROS:  Please see the history of present illness.  General:no colds or fevers, no weight changes Skin:no rashes or ulcers HEENT:no blurred vision, no congestion CV:see HPI PUL:see HPI GI:no diarrhea constipation or melena, no indigestion GU:no hematuria, no dysuria MS:no joint pain, no claudication, chronic lower ext edema Neuro:no syncope, no lightheadedness Endo:no diabetes, no thyroid disease     Physical Exam/Data:   Vitals:   02/03/18 1900  BP: 116/62  Pulse: 72  SpO2: 100%  Weight: 88.8 kg  Height: 5\' 10"  (1.778 m)   No intake or output data in the 24 hours ending 02/03/18 1936 Last 3 Weights 02/03/2018 02/02/2018 02/01/2018  Weight (lbs) 195 lb 11.2 oz 190 lb 14.7 oz 192 lb 9.6 oz  Weight (kg) 88.769 kg 86.6 kg 87.363 kg     Body mass index is 28.08 kg/m.  General:  Well nourished, well developed, in no acute distress, no current pain HEENT: normal Lymph: no adenopathy Neck: no JVD Endocrine:  No thryomegaly Vascular: No carotid bruits; pedal pulses 2+ bilaterally   Cardiac:  normal S1, S2; RRR; no murmur gallup rub or click Lungs:  clear to auscultation bilaterally, no wheezing, rhonchi  or rales  Abd: soft, nontender, no hepatomegaly  Ext: mild lower ext edema- improved from Sunday  Musculoskeletal:  No deformities, BUE and BLE strength normal and equal Skin: warm and dry  Neuro:  Alert and oriented X 3 MAE, follows commands , no focal abnormalities  noted Psych:  Normal affect  GU foley in place, clear yellow urine.    Relevant CV Studies: Cardiac cath PCI 02/01/18 Conclusions: 1. Severe three-vessel native coronary artery disease; mid RCA stenosis is more severe (90-95%) than in 08/2017. Otherwise, no significant change is noted. 2. Patent SVG-rPDA with stable mild to moderate disease. 3. Patient LIMA-LAD. 4. Known occlusions of SVG-D and SVG-OM; grafts were not engaged on today's study. 5. Successful PCI to mid RCA using orbital atherectomy and Synergy 3.5 x 38 mm drug-eluting stent with 0% residual stenosis and TIMI-3 flow 6. Successful PCI to distal RCA and rPL using Synergy 2.75 x 24 mm drug-eluting stent with 0% residual stenosis and TIMI-3 flow. 7. Successful placement of temporary transvenous pacemaker via the right femoral vein. EF 45-50% Recommendations: 1. Dual antiplatelet therapy with aspirin and clopidogrel for at least 12 months, ideally longer. 2. Aggressive secondary prevention. 3. Remove right femoral vein sheath when ACT < 175 seconds.  Diagnostic  Dominance: Right    Intervention      Laboratory Data:  Chemistry Recent Labs  Lab 02/01/18 0415 02/01/18 1942 02/02/18 0341  NA 136  --  136  K 4.0  --  3.6  CL 104  --  106  CO2 21*  --  21*  GLUCOSE 104*  --  172*  BUN 12  --  16  CREATININE 1.14 1.06 1.17  CALCIUM 8.7*  --  8.5*  GFRNONAA >60 >60 59*  GFRAA >60 >60 >60  ANIONGAP 11  --  9    Recent Labs  Lab 01/31/18 0346  PROT 5.7*  ALBUMIN 3.2*  AST 20  ALT 14  ALKPHOS 34*  BILITOT 0.6   Hematology Recent Labs  Lab 02/01/18 1942 02/02/18 0341  WBC 8.0 11.2*  RBC 3.49* 3.20*  HGB 11.1* 10.6*  HCT 33.2* 30.8*  MCV 95.1 96.3  MCH 31.8 33.1  MCHC 33.4 34.4  RDW 13.2 13.3  PLT 223 214   Cardiac Enzymes Recent Labs  Lab 01/31/18 0346 01/31/18 0922 01/31/18 1536  TROPONINI <0.03 <0.03 <0.03   No results for input(s): TROPIPOC in the last 168 hours.  BNPNo results  for input(s): BNP, PROBNP in the last 168 hours.  DDimer No results for input(s): DDIMER in the last 168 hours.  Radiology/Studies:  No results found.  Assessment and Plan:   1. Angina, though sounds similar to pericarditis with pain lying flat improved sitting up and no relief with NTG.  Troponin is elevated do not know if going down from procedure or this is new.  Serial troponins will be done, on IV heprin and iv NTG low dose.  Currently pain free - will check echo in AM. 2. CAD with recent 02/01/18 PCI to native RCA  Mid and distal.  On asa and plavix--continue hx of CABG with graft stenosis 3. HTN controlled coreg 6.25 bid, imfur 60, ranexa 1000 BID  4. HLD statin intolerant needs repatha 5. Lower ext edema on lasix continue 6. Chronic foley cath with prostate surgery in Sept - followed by urology 7. Ulcer rt ankle wound care saw and hydrocortisone cream recommended   8. GERD on protonix.   Severity of Illness: The appropriate  patient status for this patient is OBSERVATION. Observation status is judged to be reasonable and necessary in order to provide the required intensity of service to ensure the patient's safety. The patient's presenting symptoms, physical exam findings, and initial radiographic and laboratory data in the context of their medical condition is felt to place them at decreased risk for further clinical deterioration. Furthermore, it is anticipated that the patient will be medically stable for discharge from the hospital within 2 midnights of admission. The following factors support the patient status of observation.   " The patient's presenting symptoms include chest pain. 2 days post PCI " The physical exam findings include pain with lying flat " The initial radiographic and laboratory data are elevated troponin  For questions or updates, please contact [facility] Please consult [email or web address] for contact info under   Signed, Nada BoozerLaura Ingold, NP  02/03/2018 7:36 PM     The patient was seen, examined and discussed with Nada BoozerLaura Ingold, NP and I agree with the above.   78 year old patient with h/o CAD s/p CABG with graft stenosis and prior PCIs to VG to PDA and chronically occluded VG to diag and VG to OM and patent LIMA, admitted on 1/20 with UA -->[facility] and PCI to dRCA with occlusion of acute ramus during procedure. Stable and asymptomatic post procedure, troponin not checked at that time,discharged yesterday.  [facility] with chest pain, Troponin 2, pain seems pleuritis, worse with deep inspitration and lying on his back in bed, improves with sitting up, different than on presentation with UA. ECG is unchanged showing SR, negative T waves in the lateral leads. Physical exam shows no JVDs, lungs CTA, RRR, no rub, 2/6 systolic murmur. Mild B/L LE edema.  Plan: We will continue cycling troponin, doubt stent thrombosis as ECG hasn't changed. We will start therapy for acute pericarditis, no NSAIDS as he is on DAPT, but colchicine 0.6 mg PO BID.  Currently on IV heparin and IV NTG. We will d/c Heparin if troponin down trending. Troponin elevation most probably sec to small branch occlusion during intervention on 02/01/18. I will give 1 dose of iv Lasix for LE edema, crea is normal, K 3.5, recheck in the am  Tobias AlexanderKatarina Ahmiyah Coil, MD 02/03/2018

## 2018-02-03 NOTE — Plan of Care (Signed)
  Problem: Education: Goal: Knowledge of General Education information will improve Description Including pain rating scale, medication(s)/side effects and non-pharmacologic comfort measures Outcome: Progressing   

## 2018-02-04 ENCOUNTER — Other Ambulatory Visit: Payer: Self-pay

## 2018-02-04 ENCOUNTER — Observation Stay (HOSPITAL_BASED_OUTPATIENT_CLINIC_OR_DEPARTMENT_OTHER): Payer: Medicare HMO

## 2018-02-04 DIAGNOSIS — R079 Chest pain, unspecified: Secondary | ICD-10-CM | POA: Diagnosis not present

## 2018-02-04 DIAGNOSIS — I25118 Atherosclerotic heart disease of native coronary artery with other forms of angina pectoris: Secondary | ICD-10-CM

## 2018-02-04 DIAGNOSIS — I4819 Other persistent atrial fibrillation: Secondary | ICD-10-CM | POA: Diagnosis not present

## 2018-02-04 DIAGNOSIS — I351 Nonrheumatic aortic (valve) insufficiency: Secondary | ICD-10-CM

## 2018-02-04 LAB — TROPONIN I
Troponin I: 1.47 ng/mL (ref <0.03)
Troponin I: 1.3 ng/mL (ref <0.03)

## 2018-02-04 LAB — CBC
HCT: 29.9 % — ABNORMAL LOW (ref 39.0–52.0)
Hemoglobin: 10.2 g/dL — ABNORMAL LOW (ref 13.0–17.0)
MCH: 32.6 pg (ref 26.0–34.0)
MCHC: 34.1 g/dL (ref 30.0–36.0)
MCV: 95.5 fL (ref 80.0–100.0)
PLATELETS: 193 10*3/uL (ref 150–400)
RBC: 3.13 MIL/uL — ABNORMAL LOW (ref 4.22–5.81)
RDW: 13.2 % (ref 11.5–15.5)
WBC: 8.2 10*3/uL (ref 4.0–10.5)
nRBC: 0 % (ref 0.0–0.2)

## 2018-02-04 LAB — BASIC METABOLIC PANEL
Anion gap: 11 (ref 5–15)
BUN: 16 mg/dL (ref 8–23)
CHLORIDE: 102 mmol/L (ref 98–111)
CO2: 23 mmol/L (ref 22–32)
Calcium: 8.3 mg/dL — ABNORMAL LOW (ref 8.9–10.3)
Creatinine, Ser: 1.17 mg/dL (ref 0.61–1.24)
GFR calc Af Amer: >60 mL/min (ref >60)
GFR calc non Af Amer: 59 mL/min — ABNORMAL LOW (ref >60)
Glucose, Bld: 114 mg/dL — ABNORMAL HIGH (ref 70–99)
Potassium: 3.6 mmol/L (ref 3.5–5.1)
SODIUM: 136 mmol/L (ref 135–145)

## 2018-02-04 LAB — LIPID PANEL
Cholesterol: 176 mg/dL (ref 0–200)
HDL: 60 mg/dL (ref >40)
LDL Cholesterol: 97 mg/dL (ref 0–99)
Total CHOL/HDL Ratio: 2.9 RATIO
Triglycerides: 96 mg/dL (ref <150)
VLDL: 19 mg/dL (ref 0–40)

## 2018-02-04 LAB — ECHOCARDIOGRAM COMPLETE
Height: 70 in
Weight: 3020.8 oz

## 2018-02-04 LAB — HEPARIN LEVEL (UNFRACTIONATED)
Heparin Unfractionated: 0.32 IU/mL (ref 0.30–0.70)
Heparin Unfractionated: 0.45 IU/mL (ref 0.30–0.70)

## 2018-02-04 MED ORDER — PERFLUTREN LIPID MICROSPHERE
4.0000 mL | Freq: Once | INTRAVENOUS | Status: AC
  Administered 2018-02-04: 4 mL via INTRAVENOUS

## 2018-02-04 MED ORDER — APIXABAN 5 MG PO TABS
5.0000 mg | ORAL_TABLET | Freq: Two times a day (BID) | ORAL | Status: DC
  Administered 2018-02-04 – 2018-02-06 (×4): 5 mg via ORAL
  Filled 2018-02-04 (×4): qty 1

## 2018-02-04 MED ORDER — OFF THE BEAT BOOK
Freq: Once | Status: AC
  Administered 2018-02-04: 18:00:00
  Filled 2018-02-04: qty 1

## 2018-02-04 NOTE — Progress Notes (Signed)
Pt converted to Afib w/ RVR in the 140'S the Cardio Fellow notified no new orders given pt stated he could feel his heart beating fast but reported no pain more than usual EKG obtain and the pt is now resting quietly will continue to monitor

## 2018-02-04 NOTE — Progress Notes (Signed)
ANTICOAGULATION CONSULT NOTE  Pharmacy Consult for IV heparin Indication: chest pain/ACS  Allergies  Allergen Reactions  . Codeine Other (See Comments)    unknown  . Lisinopril Cough  . Contrast Media [Iodinated Diagnostic Agents] Hives and Rash    Patient Measurements: Height: 5\' 10"  (177.8 cm) Weight: 188 lb 12.8 oz (85.6 kg) IBW/kg (Calculated) : 73 Heparin Dosing Weight: 88.8 kg  Vital Signs: Temp: 99.5 F (37.5 C) (01/23 0544) Temp Source: Oral (01/23 0544) BP: 107/76 (01/23 0544) Pulse Rate: 52 (01/23 0544)  Labs: Recent Labs    02/01/18 1942 02/02/18 0341 02/03/18 2140 02/03/18 2339 02/04/18 0230 02/04/18 0748  HGB 11.1* 10.6*  --   --   --  10.2*  HCT 33.2* 30.8*  --   --   --  29.9*  PLT 223 214  --   --   --  193  HEPARINUNFRC  --   --   --  0.45 0.32  --   CREATININE 1.06 1.17  --   --   --   --   TROPONINI  --   --  1.55*  --  1.47*  --     Estimated Creatinine Clearance: 53.7 mL/min (by C-G formula based on SCr of 1.17 mg/dL).   Medical History: Past Medical History:  Diagnosis Date  . Anxiety   . Arthritis   . BPH (benign prostatic hyperplasia)   . CAD (coronary artery disease)   . Cataracts, bilateral   . Enlarged prostate   . GERD (gastroesophageal reflux disease)   . Glaucoma   . Hyperlipidemia   . Hypertension   . Non-ST elevation (NSTEMI) myocardial infarction (HCC) 08/21/2017  . Pneumonia   . PUD (peptic ulcer disease)   . Rotator cuff tear     Medications:  Scheduled:  . alfuzosin  10 mg Oral Daily  . aspirin EC  81 mg Oral Daily  . bethanechol  50 mg Oral TID  . carvedilol  6.25 mg Oral BID  . clopidogrel  75 mg Oral Daily  . colchicine  0.6 mg Oral BID  . docusate sodium  100 mg Oral Daily  . dorzolamide-timolol  1 drop Both Eyes BID  . finasteride  5 mg Oral Daily  . hydrocortisone   Topical BID  . isosorbide mononitrate  60 mg Oral Daily  . latanoprost  1 drop Both Eyes QHS  . linaclotide  145 mcg Oral QAC breakfast   . ondansetron  8 mg Oral BID  . pantoprazole  40 mg Oral Daily  . ranolazine  1,000 mg Oral BID    Assessment: 78 yo male readmitted with chest pain, elevated troponins. Heparin infusion started at [facility] at 1606 PM today at 1100 units/hr.    Initial heparin level was therapeutic at 0.45 followed by therapeutic morning level at 0.32, on 1100 units/hr. Hgb 10.2, plt 193. Troponin 1.55>1.47.   Goal of Therapy:  Heparin level 0.3-0.7 units/ml Monitor platelets by anticoagulation protocol: Yes   Plan:  1. Continue IV heparin at 1200 units/hr to keep in goal range 2. Daily heparin level and CBC. 3. F/u plans for d/c heparin if troponins trend down.  Sherron Monday, PharmD, BCCCP Clinical Pharmacist  Pager: [phone] Phone: [phone]  02/04/2018 8:09 AM

## 2018-02-04 NOTE — Discharge Instructions (Addendum)
Stop aspirin after 03/04/2018 (which is 30 days after your recent stent) You need to continue plavix and eliquis   Information about your medication: Plavix (anti-platelet agent)  Generic Name (Brand): clopidogrel (Plavix), once daily medication  PURPOSE: You are taking this medication along with aspirin to lower your chance of having a heart attack, stroke, or blood clots in your heart stent. These can be fatal. Brilinta and aspirin help prevent platelets from sticking together and forming a clot that can block an artery or your stent.   Common SIDE EFFECTS you may experience include: bruising or bleeding more easily, shortness of breath  Do not stop taking PLAVIX without talking to the doctor who prescribes it for you. People who are treated with a stent and stop taking Plavix too soon, have a higher risk of getting a blood clot in the stent, having a heart attack, or dying. If you stop Plavix because of bleeding, or for other reasons, your risk of a heart attack or stroke may increase.   Tell all of your doctors and dentists that you are taking Plavix. They should talk to the doctor who prescribed plavix for you before you have any surgery or invasive procedure.   Contact your health care provider if you experience: severe or uncontrollable bleeding, pink/red/brown urine, vomiting blood or vomit that looks like "coffee grounds", red or black stools (looks like tar), coughing up blood or blood clots ----------------------------------------------------------------------------------------------------------------------  Information on my medicine - ELIQUIS (apixaban)  Why was Eliquis prescribed for you? Eliquis was prescribed for you to reduce the risk of a blood clot forming that can cause a stroke if you have a medical condition called atrial fibrillation (a type of irregular heartbeat).  What do You need to know about Eliquis ? Take your Eliquis TWICE DAILY - one tablet in the  morning and one tablet in the evening with or without food. If you have difficulty swallowing the tablet whole please discuss with your pharmacist how to take the medication safely.  Take Eliquis exactly as prescribed by your doctor and DO NOT stop taking Eliquis without talking to the doctor who prescribed the medication.  Stopping may increase your risk of developing a stroke.  Refill your prescription before you run out.  After discharge, you should have regular check-up appointments with your healthcare provider that is prescribing your Eliquis.  In the future your dose may need to be changed if your kidney function or weight changes by a significant amount or as you get older.  What do you do if you miss a dose? If you miss a dose, take it as soon as you remember on the same day and resume taking twice daily.  Do not take more than one dose of ELIQUIS at the same time to make up a missed dose.  Important Safety Information A possible side effect of Eliquis is bleeding. You should call your healthcare provider right away if you experience any of the following: ? Bleeding from an injury or your nose that does not stop. ? Unusual colored urine (red or dark brown) or unusual colored stools (red or black). ? Unusual bruising for unknown reasons. ? A serious fall or if you hit your head (even if there is no bleeding).  Some medicines may interact with Eliquis and might increase your risk of bleeding or clotting while on Eliquis. To help avoid this, consult your healthcare provider or pharmacist prior to using any new prescription or non-prescription medications, including herbals, vitamins,  non-steroidal anti-inflammatory drugs (NSAIDs) and supplements.  This website has more information on Eliquis (apixaban): http://www.eliquis.com/eliquis/home  - - - - - - - - - - - - - - - - - - - - - - - - - - - - - - - - - - - - - - - - - - - - - - - - - - - - - - - - - - - - - - - - - - - - - - - - -  - -   Continue plavix (clopidogrel), Eliquis (apixaban), and aspirin for 30 days. Can stop taking aspirin and just continue Eliquis (apixaban) and Plavix (clopidogrel) after 03/03/2018.

## 2018-02-04 NOTE — Care Management (Signed)
#     1.  S/W  DEMETRA  @ [facility] RX #  [phone]  1. COLCHICINE  TABLET  0.6 MG BID\ COVER- YES CO-PAY- $ 3.60 TIER- 3 DRUG PRIOR APPROVAL- NO  APIXABAN: NONE FORMULARY  2.ELIQUIS    5 MG BID COVER- YES CO-PAY- $ 8.95 TIER-  3 DRUG PRIOR APPROVAL- NO  3. XARELTO   20 MG DAILY COVER- YES CO-PAY- $ 8.95 TIER- 3 DRUG PRIOR APPROVAL- NO   PREFERRED PHARMACY : YES -  [facility]

## 2018-02-04 NOTE — Progress Notes (Addendum)
Pt states having CP 5/10- would like to try Nitro SL-  NItro given 1 tab- 7:45 pm BP-112/67 After 2 mins pain decreased to 3/10- 7:47 pm After 3 mins pain is 0-1/10- 7:48 pm BP- 90/54 7:50 pm Will continue to cycle Q8mins to monitor BP BP 121/57 7:59 pm Still CP free- 8:36 pm

## 2018-02-04 NOTE — Progress Notes (Addendum)
Progress Note  Patient Name: [place] Malone Date of Encounter: 02/04/2018  Primary Cardiologist: Jenean Lindau, MD  Subjective   Feeling better this AM. No CP. No SOB.  Inpatient Medications    Scheduled Meds: . alfuzosin  10 mg Oral Daily  . aspirin EC  81 mg Oral Daily  . bethanechol  50 mg Oral TID  . carvedilol  6.25 mg Oral BID  . clopidogrel  75 mg Oral Daily  . colchicine  0.6 mg Oral BID  . docusate sodium  100 mg Oral Daily  . dorzolamide-timolol  1 drop Both Eyes BID  . finasteride  5 mg Oral Daily  . hydrocortisone   Topical BID  . isosorbide mononitrate  60 mg Oral Daily  . latanoprost  1 drop Both Eyes QHS  . linaclotide  145 mcg Oral QAC breakfast  . ondansetron  8 mg Oral BID  . pantoprazole  40 mg Oral Daily  . ranolazine  1,000 mg Oral BID   Continuous Infusions: . heparin 1,200 Units/hr (02/04/18 0830)  . nitroGLYCERIN Stopped (02/04/18 0958)   PRN Meds: acetaminophen, ALPRAZolam, nitroGLYCERIN, nitroGLYCERIN, ondansetron (ZOFRAN) IV   Vital Signs    Vitals:   02/04/18 0544 02/04/18 0646 02/04/18 0746 02/04/18 0835  BP: 107/76 (!) 95/55 (!) 82/58 104/68  Pulse: (!) 52 98 88 (!) 56  Resp: '17 20 14 11  ' Temp: 99.5 F (37.5 C)     TempSrc: Oral     SpO2: 94% 93% 95% (!) 89%  Weight: 85.6 kg     Height:        Intake/Output Summary (Last 24 hours) at 02/04/2018 1051 Last data filed at 02/04/2018 0835 Gross per 24 hour  Intake 360 ml  Output 2700 ml  Net -2340 ml   Last 3 Weights 02/04/2018 02/03/2018 02/02/2018  Weight (lbs) 188 lb 12.8 oz 195 lb 11.2 oz 190 lb 14.7 oz  Weight (kg) 85.639 kg 88.769 kg 86.6 kg     Telemetry    afib CVR (was 120s-130s earlier) - Personally Reviewed   Physical Exam   GEN: No acute distress.  HEENT: Normocephalic, atraumatic, sclera non-icteric. Neck: No JVD or bruits. Cardiac: Irregularly irregular, no murmurs, rubs, or gallops.  Radials/DP/PT 1+ and equal bilaterally.  Respiratory: Clear to  auscultation bilaterally. Breathing is unlabored. GI: Soft, nontender, non-distended, BS +x 4. MS: no deformity. Extremities: No clubbing or cyanosis. No edema. Distal pedal pulses are 2+ and equal bilaterally. Neuro:  AAOx3. Follows commands. Psych:  Responds to questions appropriately with a normal affect.  Labs    Chemistry Recent Labs  Lab 01/31/18 0346 02/01/18 0415 02/01/18 1942 02/02/18 0341 02/04/18 0748  NA 136 136  --  136 136  K 4.1 4.0  --  3.6 3.6  CL 103 104  --  106 102  CO2 22 21*  --  21* 23  GLUCOSE 132* 104*  --  172* 114*  BUN 14 12  --  16 16  CREATININE 1.08 1.14 1.06 1.17 1.17  CALCIUM 8.6* 8.7*  --  8.5* 8.3*  PROT 5.7*  --   --   --   --   ALBUMIN 3.2*  --   --   --   --   AST 20  --   --   --   --   ALT 14  --   --   --   --   ALKPHOS 34*  --   --   --   --  BILITOT 0.6  --   --   --   --   GFRNONAA >60 >60 >60 59* 59*  GFRAA >60 >60 >60 >60 >60  ANIONGAP 11 11  --  9 11     Hematology Recent Labs  Lab 02/01/18 1942 02/02/18 0341 02/04/18 0748  WBC 8.0 11.2* 8.2  RBC 3.49* 3.20* 3.13*  HGB 11.1* 10.6* 10.2*  HCT 33.2* 30.8* 29.9*  MCV 95.1 96.3 95.5  MCH 31.8 33.1 32.6  MCHC 33.4 34.4 34.1  RDW 13.2 13.3 13.2  PLT 223 214 193    Cardiac Enzymes Recent Labs  Lab 01/31/18 1536 02/03/18 2140 02/04/18 0230 02/04/18 0748  TROPONINI <0.03 1.55* 1.47* 1.30*   No results for input(s): TROPIPOC in the last 168 hours.   BNP Recent Labs  Lab 02/03/18 2140  BNP 369.7*     DDimer No results for input(s): DDIMER in the last 168 hours.   Radiology    No results found.  Patient Profile     78 y.o. male w/ CAD s/p CABG, prior PCIs, recent PCI 02/01/18, HLD, HTN, PUD, chronic foley catheter, chronic appearing anemia. Attempts were made to control pt's angina but due to progressive sx underwent 02/01/18 had PCI to [facility] with orbital atherectomy and DES and PCI to dRCA and rPL with DES. The patient experienced chest pain during stent  placement due to jailing with sluggish flow in RV marginal and small rPL branches. Troponins were initially negative that admission but he had a lot of pain post cath. However, felt well and was discharged. Returned 1/22 with recurrent CP only this time more positional/pleuritic, felt c/w pericarditis. Also developed new onset AF.  Assessment & Plan    1. Chest pain with elevated troponin - suspected to be pericarditis (could be related to periprocedure MI related to jailing of RV marginal/rPL branches). Continue new colchicine. Will hold off NSAIDS/steroids given need for triple therapy (as below). Dr. Martinique is not currently planning any relook interventions. Follow for echo. Will check CRP/ESR with AM labs. Low grade temp noted this AM. Continue to follow. Anticipate colchicine x 3 months. Care management consult in place to eval cost.  2. CAD - continues on ASA/Plavix, BB, carvedilol. Given need for triple therapy will now need to drop ASA at 30 days. Continue PPI.  3. New onset Afib - noted overnight, rate controlled on carvedilol. If low BP remains an issue can consider changing to metoprolol. Recent TSH wnl. Per d/w MD, if echo looks OK will transition from heparin to NOAC. Care management consult in process to eval cost. No role for acute cardioversion.  4. HTN with soft BP overnight - received IV Lasix overnight, will follow for now. Can resume PRN when BP demonstrates stability.  5. Lower extremity edema  - as above.   6. Hyperlipidemia - reported to be statin intolerant, will need OP f/u for PCSK9. PharmD will be reaching out to our OP lipid clinic to help facilitate.  7. Chronic appearing anemia - hemoccult stool while inpt and follow CBC. This has been relatively stable. Will need to be followed closely on blood thinners. The patient denies any prior knowledge of this and denies any known bleeding.  For questions or updates, please contact [place] Please consult [email or web address]  for contact info under Cardiology/STEMI.  Signed, Charlie Pitter, PA-C 02/04/2018, 10:51 AM    Patient seen and examined and history reviewed. Agree with above findings and plan. Patient DC 2 days ago after  complex atherectomy and stenting of the RCA. Returns with chest pan that is different from prior angina and worse with lying down. Pain improved this am with colchicine. No rub on exam. Now in Afib. Rate controlled.  Will check sed rate and CRP. Continue colchicine. If no effusion on Echo will switch IV heparin to oral Eliquis. Troponin levels are decreasing and could be related to complex intervention on Monday.  Peter Martinique, [place] 02/04/2018 2:21 PM

## 2018-02-04 NOTE — Care Management Obs Status (Signed)
MEDICARE OBSERVATION STATUS NOTIFICATION   Patient Details  Name: [place] Malone MRN: [id number] Date of Birth: 11-07-1940   Medicare Observation Status Notification Given:  Yes    Gala LewandowskyGraves-Bigelow, Lyniah Fujita Kaye, RN 02/04/2018, 4:13 PM

## 2018-02-04 NOTE — Progress Notes (Signed)
ANTICOAGULATION CONSULT NOTE  Pharmacy Consult for apixaban Indication: atrial fibrillation   Allergies  Allergen Reactions  . Codeine Other (See Comments)    unknown  . Lisinopril Cough  . Contrast Media [Iodinated Diagnostic Agents] Hives and Rash    Patient Measurements: Height: 5\' 10"  (177.8 cm) Weight: 188 lb 12.8 oz (85.6 kg) IBW/kg (Calculated) : 73 Heparin Dosing Weight: 88.8 kg  Vital Signs: Temp: 99.5 F (37.5 C) (01/23 0544) Temp Source: Oral (01/23 0544) BP: 104/68 (01/23 0835) Pulse Rate: 56 (01/23 0835)  Labs: Recent Labs    02/01/18 1942 02/02/18 0341 02/03/18 2140 02/03/18 2339 02/04/18 0230 02/04/18 0748  HGB 11.1* 10.6*  --   --   --  10.2*  HCT 33.2* 30.8*  --   --   --  29.9*  PLT 223 214  --   --   --  193  HEPARINUNFRC  --   --   --  0.45 0.32  --   CREATININE 1.06 1.17  --   --   --  1.17  TROPONINI  --   --  1.55*  --  1.47* 1.30*    Estimated Creatinine Clearance: 53.7 mL/min (by C-G formula based on SCr of 1.17 mg/dL).   Medical History: Past Medical History:  Diagnosis Date  . Anxiety   . Arthritis   . BPH (benign prostatic hyperplasia)   . CAD (coronary artery disease)   . Cataracts, bilateral   . Enlarged prostate   . GERD (gastroesophageal reflux disease)   . Glaucoma   . Hyperlipidemia   . Hypertension   . Non-ST elevation (NSTEMI) myocardial infarction (HCC) 08/21/2017  . Pneumonia   . PUD (peptic ulcer disease)   . Rotator cuff tear     Medications:  Scheduled:  . alfuzosin  10 mg Oral Daily  . aspirin EC  81 mg Oral Daily  . bethanechol  50 mg Oral TID  . carvedilol  6.25 mg Oral BID  . clopidogrel  75 mg Oral Daily  . colchicine  0.6 mg Oral BID  . docusate sodium  100 mg Oral Daily  . dorzolamide-timolol  1 drop Both Eyes BID  . finasteride  5 mg Oral Daily  . hydrocortisone   Topical BID  . isosorbide mononitrate  60 mg Oral Daily  . latanoprost  1 drop Both Eyes QHS  . linaclotide  145 mcg Oral QAC  breakfast  . off the beat book   Does not apply Once  . ondansetron  8 mg Oral BID  . pantoprazole  40 mg Oral Daily  . ranolazine  1,000 mg Oral BID    Assessment: 78 yo male readmitted with chest pain, elevated troponins. Heparin infusion started at [facility] at 1606 PM today at 1100 units/hr.    Initial heparin level was therapeutic at 0.45 followed by therapeutic morning level at 0.32, on 1100 units/hr. Hgb 10.2, plt 193. Troponin 1.55>1.47. Patient has new diagnosis for atrial fibrillation. Pharmacy consulted for apixaban start.   Goal of Therapy:  Monitor platelets by anticoagulation protocol: Yes   Plan:  1. Discontinue heparin drip 2. Start apixaban 5mg  BID 3. Monitor s/sx of bleeding   Gwynneth AlbrightSara Nimer, Ilda Bassetharm D PGY1 Pharmacy Resident  Phone [phone] 02/04/2018   3:07 PM

## 2018-02-05 ENCOUNTER — Other Ambulatory Visit: Payer: Self-pay | Admitting: Cardiology

## 2018-02-05 DIAGNOSIS — F419 Anxiety disorder, unspecified: Secondary | ICD-10-CM | POA: Diagnosis present

## 2018-02-05 DIAGNOSIS — I309 Acute pericarditis, unspecified: Secondary | ICD-10-CM | POA: Diagnosis not present

## 2018-02-05 DIAGNOSIS — S81801A Unspecified open wound, right lower leg, initial encounter: Secondary | ICD-10-CM | POA: Diagnosis present

## 2018-02-05 DIAGNOSIS — Z66 Do not resuscitate: Secondary | ICD-10-CM | POA: Diagnosis present

## 2018-02-05 DIAGNOSIS — K219 Gastro-esophageal reflux disease without esophagitis: Secondary | ICD-10-CM | POA: Diagnosis present

## 2018-02-05 DIAGNOSIS — I25810 Atherosclerosis of coronary artery bypass graft(s) without angina pectoris: Secondary | ICD-10-CM | POA: Diagnosis present

## 2018-02-05 DIAGNOSIS — R6883 Chills (without fever): Secondary | ICD-10-CM | POA: Diagnosis not present

## 2018-02-05 DIAGNOSIS — Z79899 Other long term (current) drug therapy: Secondary | ICD-10-CM | POA: Diagnosis not present

## 2018-02-05 DIAGNOSIS — E785 Hyperlipidemia, unspecified: Secondary | ICD-10-CM | POA: Diagnosis present

## 2018-02-05 DIAGNOSIS — Z7902 Long term (current) use of antithrombotics/antiplatelets: Secondary | ICD-10-CM | POA: Diagnosis not present

## 2018-02-05 DIAGNOSIS — I110 Hypertensive heart disease with heart failure: Secondary | ICD-10-CM | POA: Diagnosis present

## 2018-02-05 DIAGNOSIS — I25708 Atherosclerosis of coronary artery bypass graft(s), unspecified, with other forms of angina pectoris: Secondary | ICD-10-CM | POA: Diagnosis not present

## 2018-02-05 DIAGNOSIS — R079 Chest pain, unspecified: Secondary | ICD-10-CM | POA: Diagnosis not present

## 2018-02-05 DIAGNOSIS — I4891 Unspecified atrial fibrillation: Secondary | ICD-10-CM | POA: Diagnosis not present

## 2018-02-05 DIAGNOSIS — R0789 Other chest pain: Secondary | ICD-10-CM | POA: Diagnosis present

## 2018-02-05 DIAGNOSIS — I25119 Atherosclerotic heart disease of native coronary artery with unspecified angina pectoris: Secondary | ICD-10-CM | POA: Diagnosis present

## 2018-02-05 DIAGNOSIS — Z955 Presence of coronary angioplasty implant and graft: Secondary | ICD-10-CM | POA: Diagnosis not present

## 2018-02-05 DIAGNOSIS — I319 Disease of pericardium, unspecified: Secondary | ICD-10-CM | POA: Diagnosis not present

## 2018-02-05 DIAGNOSIS — Z7982 Long term (current) use of aspirin: Secondary | ICD-10-CM | POA: Diagnosis not present

## 2018-02-05 DIAGNOSIS — I5032 Chronic diastolic (congestive) heart failure: Secondary | ICD-10-CM | POA: Diagnosis present

## 2018-02-05 DIAGNOSIS — I252 Old myocardial infarction: Secondary | ICD-10-CM | POA: Diagnosis not present

## 2018-02-05 DIAGNOSIS — Z885 Allergy status to narcotic agent status: Secondary | ICD-10-CM | POA: Diagnosis not present

## 2018-02-05 DIAGNOSIS — H409 Unspecified glaucoma: Secondary | ICD-10-CM | POA: Diagnosis present

## 2018-02-05 DIAGNOSIS — Z8711 Personal history of peptic ulcer disease: Secondary | ICD-10-CM | POA: Diagnosis not present

## 2018-02-05 DIAGNOSIS — Z79890 Hormone replacement therapy (postmenopausal): Secondary | ICD-10-CM | POA: Diagnosis not present

## 2018-02-05 DIAGNOSIS — Z951 Presence of aortocoronary bypass graft: Secondary | ICD-10-CM | POA: Diagnosis not present

## 2018-02-05 DIAGNOSIS — I480 Paroxysmal atrial fibrillation: Secondary | ICD-10-CM | POA: Diagnosis present

## 2018-02-05 DIAGNOSIS — N400 Enlarged prostate without lower urinary tract symptoms: Secondary | ICD-10-CM | POA: Diagnosis present

## 2018-02-05 DIAGNOSIS — R7989 Other specified abnormal findings of blood chemistry: Secondary | ICD-10-CM | POA: Diagnosis present

## 2018-02-05 LAB — CBC
HCT: 31 % — ABNORMAL LOW (ref 39.0–52.0)
Hemoglobin: 10.3 g/dL — ABNORMAL LOW (ref 13.0–17.0)
MCH: 32.3 pg (ref 26.0–34.0)
MCHC: 33.2 g/dL (ref 30.0–36.0)
MCV: 97.2 fL (ref 80.0–100.0)
Platelets: 193 10*3/uL (ref 150–400)
RBC: 3.19 MIL/uL — ABNORMAL LOW (ref 4.22–5.81)
RDW: 13 % (ref 11.5–15.5)
WBC: 7.1 10*3/uL (ref 4.0–10.5)
nRBC: 0 % (ref 0.0–0.2)

## 2018-02-05 LAB — OCCULT BLOOD X 1 CARD TO LAB, STOOL: Fecal Occult Bld: NEGATIVE

## 2018-02-05 LAB — BASIC METABOLIC PANEL
Anion gap: 6 (ref 5–15)
BUN: 15 mg/dL (ref 8–23)
CHLORIDE: 106 mmol/L (ref 98–111)
CO2: 25 mmol/L (ref 22–32)
Calcium: 8.6 mg/dL — ABNORMAL LOW (ref 8.9–10.3)
Creatinine, Ser: 1.19 mg/dL (ref 0.61–1.24)
Glucose, Bld: 112 mg/dL — ABNORMAL HIGH (ref 70–99)
Potassium: 3.8 mmol/L (ref 3.5–5.1)
Sodium: 137 mmol/L (ref 135–145)

## 2018-02-05 LAB — SEDIMENTATION RATE: Sed Rate: 33 mm/hr — ABNORMAL HIGH (ref 0–16)

## 2018-02-05 LAB — C-REACTIVE PROTEIN: CRP: 6.9 mg/dL — ABNORMAL HIGH (ref <1.0)

## 2018-02-05 MED ORDER — LATANOPROST 0.005 % OP SOLN
1.0000 [drp] | Freq: Two times a day (BID) | OPHTHALMIC | Status: DC
  Administered 2018-02-05 – 2018-02-06 (×2): 1 [drp] via OPHTHALMIC
  Filled 2018-02-05: qty 2.5

## 2018-02-05 MED ORDER — EZETIMIBE 10 MG PO TABS
10.0000 mg | ORAL_TABLET | Freq: Every day | ORAL | Status: DC
  Administered 2018-02-05 – 2018-02-06 (×2): 10 mg via ORAL
  Filled 2018-02-05 (×2): qty 1

## 2018-02-05 MED ORDER — COLCHICINE 0.6 MG PO TABS: 0.6000 mg | ORAL_TABLET | Freq: Two times a day (BID) | ORAL | 11 refills | Status: DC

## 2018-02-05 MED ORDER — EZETIMIBE 10 MG PO TABS: 10.0000 mg | ORAL_TABLET | Freq: Every day | ORAL | 11 refills | Status: DC

## 2018-02-05 MED ORDER — APIXABAN 5 MG PO TABS: 5.0000 mg | ORAL_TABLET | Freq: Two times a day (BID) | ORAL | 11 refills | Status: DC

## 2018-02-05 MED FILL — COLCHICINE 0.6 MG TABS: 0.6 | 30 days supply | Qty: 60 | Fill #0 | Status: TO

## 2018-02-05 MED FILL — EZETIMIBE 10 MG TABS: 10 | 30 days supply | Qty: 30 | Fill #0 | Status: TO

## 2018-02-05 MED FILL — ELIQUIS 5 MG TABLET: 5 | 30 days supply | Qty: 60 | Fill #0 | Status: TO

## 2018-02-05 NOTE — Progress Notes (Addendum)
Progress Note  Patient Name: [place] Malone Date of Encounter: 02/05/2018  Primary Cardiologist: Garwin Brothers, MD  Subjective   Pt is currently chest pain free however per chart review, he had an episode yesterday evening. Denies SOB.   Inpatient Medications    Scheduled Meds: . alfuzosin  10 mg Oral Daily  . apixaban  5 mg Oral BID  . aspirin EC  81 mg Oral Daily  . bethanechol  50 mg Oral TID  . carvedilol  6.25 mg Oral BID  . clopidogrel  75 mg Oral Daily  . colchicine  0.6 mg Oral BID  . docusate sodium  100 mg Oral Daily  . dorzolamide-timolol  1 drop Both Eyes BID  . finasteride  5 mg Oral Daily  . hydrocortisone   Topical BID  . isosorbide mononitrate  60 mg Oral Daily  . latanoprost  1 drop Both Eyes QHS  . linaclotide  145 mcg Oral QAC breakfast  . ondansetron  8 mg Oral BID  . pantoprazole  40 mg Oral Daily  . ranolazine  1,000 mg Oral BID   Continuous Infusions:  PRN Meds: acetaminophen, ALPRAZolam, nitroGLYCERIN, ondansetron (ZOFRAN) IV   Vital Signs    Vitals:   02/04/18 0835 02/04/18 1445 02/04/18 2134 02/05/18 0513  BP: 104/68 112/66 134/69 135/66  Pulse: (!) 56 65 70 61  Resp: 11 15    Temp:   98.1 F (36.7 C) 97.8 F (36.6 C)  TempSrc:   Oral Oral  SpO2: (!) 89% 95% 99% 99%  Weight:    84.8 kg  Height:        Intake/Output Summary (Last 24 hours) at 02/05/2018 0959 Last data filed at 02/05/2018 0651 Gross per 24 hour  Intake 659.92 ml  Output 825 ml  Net -165.08 ml   Filed Weights   02/03/18 1900 02/04/18 0544 02/05/18 0513  Weight: 88.8 kg 85.6 kg 84.8 kg    Physical Exam   General: Well developed, well nourished, NAD Skin: Warm, dry, intact  Head: Normocephalic, atraumatic, clear, moist mucus membranes. Neck: Negative for carotid bruits. No JVD Lungs:Clear to ausculation bilaterally. No wheezes, rales, or rhonchi. Breathing is unlabored. Cardiovascular: RRR with S1 S2. No murmurs, rubs, gallops, or LV heave  appreciated. Abdomen: Soft, non-tender, non-distended with normoactive bowel sounds. No obvious abdominal masses. MSK: Strength and tone appear normal for age. 5/5 in all extremities Extremities: No edema. No clubbing or cyanosis. DP/PT pulses 2+ bilaterally Neuro: Alert and oriented. No focal deficits. No facial asymmetry. MAE spontaneously. Psych: Responds to questions appropriately with normal affect.    Labs    Chemistry Recent Labs  Lab 01/31/18 0346  02/02/18 0341 02/04/18 0748 02/05/18 0432  NA 136   < > 136 136 137  K 4.1   < > 3.6 3.6 3.8  CL 103   < > 106 102 106  CO2 22   < > 21* 23 25  GLUCOSE 132*   < > 172* 114* 112*  BUN 14   < > 16 16 15   CREATININE 1.08   < > 1.17 1.17 1.19  CALCIUM 8.6*   < > 8.5* 8.3* 8.6*  PROT 5.7*  --   --   --   --   ALBUMIN 3.2*  --   --   --   --   AST 20  --   --   --   --   ALT 14  --   --   --   --  ALKPHOS 34*  --   --   --   --   BILITOT 0.6  --   --   --   --   GFRNONAA >60   < > 59* 59* NOT CALCULATED  GFRAA >60   < > >60 >60 NOT CALCULATED  ANIONGAP 11   < > 9 11 6    < > = values in this interval not displayed.     Hematology Recent Labs  Lab 02/02/18 0341 02/04/18 0748 02/05/18 0432  WBC 11.2* 8.2 7.1  RBC 3.20* 3.13* 3.19*  HGB 10.6* 10.2* 10.3*  HCT 30.8* 29.9* 31.0*  MCV 96.3 95.5 97.2  MCH 33.1 32.6 32.3  MCHC 34.4 34.1 33.2  RDW 13.3 13.2 13.0  PLT 214 193 193   Cardiac Enzymes Recent Labs  Lab 01/31/18 1536 02/03/18 2140 02/04/18 0230 02/04/18 0748  TROPONINI <0.03 1.55* 1.47* 1.30*   No results for input(s): TROPIPOC in the last 168 hours.   BNP Recent Labs  Lab 02/03/18 2140  BNP 369.7*    DDimer No results for input(s): DDIMER in the last 168 hours.   Radiology    No results found.  Telemetry    02/05/2018 NSR  - Personally Reviewed  ECG    No new tracing as of 02/05/18 - Personally Reviewed  Cardiac Studies   Echocardiogram: 02/04/2018: Study Conclusions  - Left  ventricle: The cavity size was mildly dilated. Systolic   function was normal. The estimated ejection fraction was in the   range of 55% to 60%. Wall motion was normal; there were no   regional wall motion abnormalities. Doppler parameters are   consistent with abnormal left ventricular relaxation (grade 1   diastolic dysfunction). - Aortic valve: There was mild regurgitation. - Mitral valve: Calcified annulus. - Left atrium: The atrium was mildly dilated. - Right ventricle: Systolic function was mildly reduced. - Atrial septum: No defect or patent foramen ovale was identified.  Impressions:  - Echo contrast used to better define endocardial borders. Normal   LV EF, no significant wallk motion abnormalities.  Patient Profile     78 y.o. male w/ CAD s/p CABG, prior PCIs, recent PCI 02/01/18, HLD, HTN, PUD, chronic foley catheter, chronic appearing anemia. Attempts were made to control pt's angina but due to progressive sx underwent [facility]/01/18 [facility] and [facility] and PCI to dRCA and rPL with DES. The patient experienced chest pain during stent placement due to jailing with sluggish flow in RV marginal and small rPL branches. Troponins were initially negative that admission but he had a lot of pain post cath. However, felt well and was discharged. Returned 1/22 with recurrent CP only this time more positional/pleuritic, felt c/w pericarditis. Also developed new onset AF.  Assessment & Plan    1. Chest pain with elevated troponin: -Suspected to be pericarditis (could be related to periprocedure MI related to jailing of RV marginal/rPL branches).  -Continue new colchicine -Will hold off NSAIDS/steroids given need for triple therapy (as below). Dr. SwazilandJordan is not currently planning any relook interventions -Anticipate colchicine x 3 months  2. CAD: -Continues on ASA/Plavix, BB, carvedilol -Given need for triple therapy will now need to drop ASA at 30 days.  -Continue PPI  3. New onset Afib: -Pt currently in NSR, converted to NSR spontaneously  -No role for acute cardioversion.  4. HTN with soft BP overnight:  -Received IV Lasix overnight, will follow for now.  -Can resume PRN when  BP demonstrates stability.  5. Hyperlipidemia: -Reported to be statin intolerant, will need OP referral to lipid clinic   7. Chronic appearing anemia: -Hb stable at 10.3 -Will need to be followed closely on blood thinners.   Signed, Georgie Chard NP-C [facility] Pager: [phone] 02/05/2018, 9:59 AM     For questions or updates, please contact   Please consult [email or web address] for contact info under Cardiology/STEMI.   Patient seen and examined and history reviewed. Agree with above findings and plan. He feels well today. Had an episode of chest pain last night relieved with Ntg. He doesn't even remember this. He has converted from Afib to NSR. Echo looks good. We will continue colchicine. On Eliquis now for Afib. Will ambulate today and observe here today. If he is stable I would anticipate DC tomorrow.    [place], MDFACC 02/05/2018 10:24 AM

## 2018-02-05 NOTE — Progress Notes (Signed)
Pt c/o having episode of nausea with cold sweats x1.  Afebril at 98.6.  Zofran prn given.  Will continue to monitor.

## 2018-02-06 DIAGNOSIS — Z789 Other specified health status: Secondary | ICD-10-CM

## 2018-02-06 DIAGNOSIS — I25708 Atherosclerosis of coronary artery bypass graft(s), unspecified, with other forms of angina pectoris: Secondary | ICD-10-CM

## 2018-02-06 DIAGNOSIS — I10 Essential (primary) hypertension: Secondary | ICD-10-CM

## 2018-02-06 DIAGNOSIS — R6883 Chills (without fever): Secondary | ICD-10-CM

## 2018-02-06 DIAGNOSIS — I4891 Unspecified atrial fibrillation: Secondary | ICD-10-CM

## 2018-02-06 DIAGNOSIS — E785 Hyperlipidemia, unspecified: Secondary | ICD-10-CM

## 2018-02-06 DIAGNOSIS — D649 Anemia, unspecified: Secondary | ICD-10-CM

## 2018-02-06 DIAGNOSIS — I309 Acute pericarditis, unspecified: Secondary | ICD-10-CM

## 2018-02-06 LAB — CBC
HCT: 31.7 % — ABNORMAL LOW (ref 39.0–52.0)
Hemoglobin: 10.5 g/dL — ABNORMAL LOW (ref 13.0–17.0)
MCH: 32.1 pg (ref 26.0–34.0)
MCHC: 33.1 g/dL (ref 30.0–36.0)
MCV: 96.9 fL (ref 80.0–100.0)
Platelets: 215 10*3/uL (ref 150–400)
RBC: 3.27 MIL/uL — ABNORMAL LOW (ref 4.22–5.81)
RDW: 12.9 % (ref 11.5–15.5)
WBC: 6.8 10*3/uL (ref 4.0–10.5)
nRBC: 0 % (ref 0.0–0.2)

## 2018-02-06 NOTE — Progress Notes (Addendum)
Progress Note  Patient Name: [place] Malone Date of Encounter: 02/06/2018  Primary Cardiologist: Garwin Brothers, [place]   Subjective   Denies any CP or SOB. Had a cold spell last night.   Inpatient Medications    Scheduled Meds: . alfuzosin  10 mg Oral Daily  . apixaban  5 mg Oral BID  . aspirin EC  81 mg Oral Daily  . bethanechol  50 mg Oral TID  . carvedilol  6.25 mg Oral BID  . clopidogrel  75 mg Oral Daily  . colchicine  0.6 mg Oral BID  . docusate sodium  100 mg Oral Daily  . dorzolamide-timolol  1 drop Both Eyes BID  . ezetimibe  10 mg Oral Daily  . finasteride  5 mg Oral Daily  . hydrocortisone   Topical BID  . isosorbide mononitrate  60 mg Oral Daily  . latanoprost  1 drop Both Eyes BID  . linaclotide  145 mcg Oral QAC breakfast  . ondansetron  8 mg Oral BID  . pantoprazole  40 mg Oral Daily  . ranolazine  1,000 mg Oral BID   Continuous Infusions:  PRN Meds: acetaminophen, ALPRAZolam, nitroGLYCERIN, ondansetron (ZOFRAN) IV   Vital Signs    Vitals:   02/05/18 1424 02/05/18 2000 02/05/18 2036 02/06/18 0300  BP: 111/61 110/75 135/77 111/87  Pulse: 67  68 66  Resp: 14 20  18   Temp: 98.5 F (36.9 C)  98.4 F (36.9 C) 98.5 F (36.9 C)  TempSrc: Oral  Oral Oral  SpO2: 98% 98% 98% 100%  Weight:    83.5 kg  Height:        Intake/Output Summary (Last 24 hours) at 02/06/2018 0834 Last data filed at 02/05/2018 2200 Gross per 24 hour  Intake 482.33 ml  Output 1125 ml  Net -642.67 ml   Last 3 Weights 02/06/2018 02/05/2018 02/04/2018  Weight (lbs) 184 lb 1.6 oz 186 lb 14.4 oz 188 lb 12.8 oz  Weight (kg) 83.507 kg 84.777 kg 85.639 kg      Telemetry    NSR with rare PVC - Personally Reviewed  ECG    NSR with minimal ST downtrending and TWI in lateral leads- Personally Reviewed  Physical Exam   GEN: No acute distress.   Neck: No JVD Cardiac: RRR, no murmurs, rubs, or gallops.  Respiratory: Clear to auscultation bilaterally. GI: Soft, nontender,  non-distended  MS: No edema; No deformity. Neuro:  Nonfocal  Psych: Normal affect   Labs    Chemistry Recent Labs  Lab 01/31/18 0346  02/02/18 0341 02/04/18 0748 02/05/18 0432  NA 136   < > 136 136 137  K 4.1   < > 3.6 3.6 3.8  CL 103   < > 106 102 106  CO2 22   < > 21* 23 25  GLUCOSE 132*   < > 172* 114* 112*  BUN 14   < > 16 16 15   CREATININE 1.08   < > 1.17 1.17 1.19  CALCIUM 8.6*   < > 8.5* 8.3* 8.6*  PROT 5.7*  --   --   --   --   ALBUMIN 3.2*  --   --   --   --   AST 20  --   --   --   --   ALT 14  --   --   --   --   ALKPHOS 34*  --   --   --   --  BILITOT 0.6  --   --   --   --   GFRNONAA >60   < > 59* 59* NOT CALCULATED  GFRAA >60   < > >60 >60 NOT CALCULATED  ANIONGAP 11   < > 9 11 6    < > = values in this interval not displayed.     Hematology Recent Labs  Lab 02/04/18 0748 02/05/18 0432 02/06/18 0340  WBC 8.2 7.1 6.8  RBC 3.13* 3.19* 3.27*  HGB 10.2* 10.3* 10.5*  HCT 29.9* 31.0* 31.7*  MCV 95.5 97.2 96.9  MCH 32.6 32.3 32.1  MCHC 34.1 33.2 33.1  RDW 13.2 13.0 12.9  PLT 193 193 215    Cardiac Enzymes Recent Labs  Lab 01/31/18 1536 02/03/18 2140 02/04/18 0230 02/04/18 0748  TROPONINI <0.03 1.55* 1.47* 1.30*   No results for input(s): TROPIPOC in the last 168 hours.   BNP Recent Labs  Lab 02/03/18 2140  BNP 369.7*     DDimer No results for input(s): DDIMER in the last 168 hours.   Radiology    No results found.  Cardiac Studies   Cath 02/01/2018 Conclusions: 1. Severe three-vessel native coronary artery disease; mid RCA stenosis is more severe (90-95%) than in 08/2017. Otherwise, no significant change is noted. 2. Patent SVG-rPDA with stable mild to moderate disease. 3. Patient LIMA-LAD. 4. Known occlusions of SVG-D and SVG-OM; grafts were not engaged on today's study. 5. Successful PCI to mid RCA using orbital atherectomy and Synergy 3.5 x 38 mm drug-eluting stent with 0% residual stenosis and TIMI-3 flow 6. Successful PCI  to distal RCA and rPL using Synergy 2.75 x 24 mm drug-eluting stent with 0% residual stenosis and TIMI-3 flow. 7. Successful placement of temporary transvenous pacemaker via the right femoral vein.  Recommendations: 1. Dual antiplatelet therapy with aspirin and clopidogrel for at least 12 months, ideally longer. 2. Aggressive secondary prevention. 3. Remove right femoral vein sheath when ACT < 175 seconds.    Echo 02/04/2018 LV EF: 55% -   60%  Study Conclusions  - Left ventricle: The cavity size was mildly dilated. Systolic   function was normal. The estimated ejection fraction was in the   range of 55% to 60%. Wall motion was normal; there were no   regional wall motion abnormalities. Doppler parameters are   consistent with abnormal left ventricular relaxation (grade 1   diastolic dysfunction). - Aortic valve: There was mild regurgitation. - Mitral valve: Calcified annulus. - Left atrium: The atrium was mildly dilated. - Right ventricle: Systolic function was mildly reduced. - Atrial septum: No defect or patent foramen ovale was identified.  Impressions:  - Echo contrast used to better define endocardial borders. Normal   LV EF, no significant wallk motion abnormalities.  Patient Profile     78 y.o. male with PMH of CAD s/p CABG (chronic occluded SVG to Diag and SVG to OM, patent SVG to rPDA, patent LIMA, recently had PCI to mid to distal RCA in 01/2018) presented back with chest pain. [facility], troponin negative. Pain seems to be more pleuritic with suspecion of pericarditis. He developed new atrial fibrillation during this admission. Recent TSH normal.   Assessment & Plan    1. Chest pain concerning for pericarditis: troponin elevated felt to be related to small branch occlusion jailed during intervention on 02/01/2018  - no NSAIDS/steroids given the need for triple therapy. Continue colchicine. No plan for relook intervention per Dr.  Swazil[place]Jordan  - Echo  02/04/2018 normal wall motion, no significant pericardial effusion, EF 55-60%  - discharge on colchicine for 3 month. On coreg, imdur and ranexa. Discharge today.   2. CAD s/p CABG: recent PCI 02/01/2018 with DES to RCA.  3. PAF: CHA2DS2-Vasc score 4 (age 37, CAD, HTN). Converted to NSR. Continue ASA, plavix and eliquis, stop ASA after 03/04/2018.   4. HTN: well controlled on coreg and imdur  5. HLD: intolerant to statin. Refer to the lipid clinic   6. Chronic foley with prostate sugery in Sept: followed by urology  7. Chronic anemia: hemoccult negative.   8. Chills: mentioned a cold spell last night. No fever, no WBC elevation. Blood sugar normal. Apparently this cold spells has been chronic for the past year with no identifiable cause.    For questions or updates, please contact [facility] Please consult [email or web address] for contact info under       Signed, Azalee Course, PA  02/06/2018, 8:34 AM    The patient was seen and examined, and I agree with the history, physical exam, assessment and plan as documented above, with modifications as noted below.  He had been doing very well this morning.  He did have an episode of vomiting and had some mild left-sided chest pain but said it was nothing concerning.  He remains in sinus rhythm.  Plan as stated above with colchicine, Imdur, and Ranexa.  He is statin intolerant and will be referred to the lipid clinic.  Stop aspirin after 03/04/2018 and continue Eliquis and Plavix.  He will be discharged today.   Prentice Docker, [place], [facility]  02/06/2018 9:10 AM

## 2018-02-06 NOTE — Discharge Summary (Signed)
Discharge Summary    Patient ID: Austin Malone MRN: [id number]; DOB: 08-Sep-1940  Admit date: 02/03/2018 Discharge date: 02/06/2018  Primary Care Provider: Shelbie Ammons, MD  Primary Cardiologist: Garwin Brothers, MD  Primary Electrophysiologist:  None   Discharge Diagnoses    Principal Problem:   Pericarditis Active Problems:   Hx of CABG   Hyperlipidemia   Hypertension   CAD (coronary artery disease)   Chest pain at rest   Chronic diastolic CHF (congestive heart failure) (HCC)   Allergies Allergies  Allergen Reactions  . Codeine Other (See Comments)    unknown  . Lisinopril Cough  . Contrast Media [Iodinated Diagnostic Agents] Hives and Rash    Diagnostic Studies/Procedures    Echo 02/04/2018 LV EF: 55% -   60% Study Conclusions  - Left ventricle: The cavity size was mildly dilated. Systolic   function was normal. The estimated ejection fraction was in the   range of 55% to 60%. Wall motion was normal; there were no   regional wall motion abnormalities. Doppler parameters are   consistent with abnormal left ventricular relaxation (grade 1   diastolic dysfunction). - Aortic valve: There was mild regurgitation. - Mitral valve: Calcified annulus. - Left atrium: The atrium was mildly dilated. - Right ventricle: Systolic function was mildly reduced. - Atrial septum: No defect or patent foramen ovale was identified.  Impressions:  - Echo contrast used to better define endocardial borders. Normal   LV EF, no significant wallk motion abnormalities.  _____________   History of Present Illness     Austin Malone with hx of CAD s/p CABG with graft stenosis and prior PCIs to VG to PDA and chronically occluded VG to diag and VG to OM and patent LIMA, attempt to manage medically but pt with increasing angina so 02/01/18 had PCI to [facility] with orbital atherectomy and DES and PCI to dRCA and rPL with DES, HLD, HTN, hx MI, PUD and chronic foley catheter, he did well and  discharged 02/02/18 He developed acute chest pain today and went to [facility] with elevated troponins.  Transferred to [facility].    He did well post PCI and ambulated on the 21st without chest pain and was ready for discharge.  His admit troponins were neg and none post procedure.  He did have a lot of pain with the procedure.    Today he developed pain during the early hours.  Occurs with deep breath and lying back in bed, improves with sitting up  Different than the pain he presented over the weekend with.  NTG has not helped this pain.  Associated with mild SOB.  He went to [facility]     Consultants: N/A  Patient was transferred from [facility] to [facility] for further evaluation.  Troponin was elevated, however suspect the elevated troponin was related to small branch occlusion geology during the recent intervention.  His chest pain was felt to be consistent with pericarditis, worse with laying down better with sitting up.  It was decided to avoid NSAIDs and steroid given the need for triple therapy.  He was placed on colchicine 0.6 mg twice daily.  Echocardiogram obtained on 02/04/2018 showed a normal wall motion, no significant pericardial effusion, EF 55 to 60%.  Patient was seen by Dr. [place], at this time there was no plan for relook intervention given improvement of symptoms.  He will continue on aspirin, Plavix and Eliquis with plan to stop aspirin after  03/04/2018.  He will also require outpatient lipid clinic referral given his intolerance of statin.   Patient was seen in the morning of 02/06/2018, at which time he denies any significant shortness of breath.  He did have a episode of vomiting and mild left-sided chest discomfort however this quickly resolved and has not recurred again.  He is deemed stable for discharge from cardiology perspective.  _____________  Discharge Vitals Blood pressure 111/87, pulse 66, temperature 98.5 F (36.9  C), temperature source Oral, resp. rate 18, height 5\' 10"  (1.778 m), weight 83.5 kg, SpO2 100 %.  Filed Weights   02/04/18 0544 02/05/18 0513 02/06/18 0300  Weight: 85.6 kg 84.8 kg 83.5 kg    Labs & Radiologic Studies    CBC Recent Labs    02/05/18 0432 02/06/18 0340  WBC 7.1 6.8  HGB 10.3* 10.5*  HCT 31.0* 31.7*  MCV 97.2 96.9  PLT 193 215   Basic Metabolic Panel Recent Labs    38/25/05 2140 02/04/18 0748 02/05/18 0432  NA  --  136 137  K  --  3.6 3.8  CL  --  102 106  CO2  --  23 25  GLUCOSE  --  114* 112*  BUN  --  16 15  CREATININE  --  1.17 1.19  CALCIUM  --  8.3* 8.6*  MG 2.0  --   --    Liver Function Tests No results for input(s): AST, ALT, ALKPHOS, BILITOT, PROT, ALBUMIN in the last 72 hours. No results for input(s): LIPASE, AMYLASE in the last 72 hours. Cardiac Enzymes Recent Labs    02/03/18 2140 02/04/18 0230 02/04/18 0748  TROPONINI 1.55* 1.47* 1.30*   BNP Invalid input(s): POCBNP D-Dimer No results for input(s): DDIMER in the last 72 hours. Hemoglobin A1C No results for input(s): HGBA1C in the last 72 hours. Fasting Lipid Panel Recent Labs    02/04/18 0230  CHOL 176  HDL 60  LDLCALC 97  TRIG 96  CHOLHDL 2.9   Thyroid Function Tests No results for input(s): TSH, T4TOTAL, T3FREE, THYROIDAB in the last 72 hours.  Invalid input(s): FREET3 _____________  No results found. Disposition   Pt is being discharged home today in good condition.  Follow-up Plans & Appointments    Follow-up Information    Revankar, Aundra Dubin, MD Follow up on 02/25/2018.   Specialty:  Cardiology Why:  2PM. Cardiology visit.  Contact information: [address] [place] [address] [phone]          Discharge Instructions    Diet - low sodium heart healthy   Complete by:  As directed    Increase activity slowly   Complete by:  As directed       Discharge Medications   Allergies as of 02/06/2018      Reactions   Codeine Other (See  Comments)   unknown   Lisinopril Cough   Contrast Media [iodinated Diagnostic Agents] Hives, Rash      Medication List    TAKE these medications   alfuzosin 10 MG 24 hr tablet Commonly known as:  UROXATRAL Take 1 tablet by mouth daily.   ALPRAZolam 0.5 MG tablet Commonly known as:  XANAX Take 0.5 mg by mouth 2 (two) times daily as needed for anxiety or sleep.   apixaban 5 MG Tabs tablet Commonly known as:  ELIQUIS Take 1 tablet (5 mg total) by mouth 2 (two) times daily.   aspirin EC 81 MG tablet Take 81 mg by mouth  daily.   bethanechol 50 MG tablet Commonly known as:  URECHOLINE Take 50 mg by mouth 3 (three) times daily.   carvedilol 6.25 MG tablet Commonly known as:  COREG Take 1 tablet by mouth 2 (two) times daily.   clopidogrel 75 MG tablet Commonly known as:  PLAVIX Take 1 tablet (75 mg total) by mouth daily.   colchicine 0.6 MG tablet Take 1 tablet (0.6 mg total) by mouth 2 (two) times daily.   docusate sodium 100 MG capsule Commonly known as:  COLACE Take 100 mg by mouth daily.   dorzolamide-timolol 22.3-6.8 MG/ML ophthalmic solution Commonly known as:  COSOPT Place 1 drop into both eyes 2 (two) times daily.   ezetimibe 10 MG tablet Commonly known as:  ZETIA Take 1 tablet (10 mg total) by mouth daily.   finasteride 5 MG tablet Commonly known as:  PROSCAR Take 1 tablet (5 mg total) by mouth daily.   folic acid 1 MG tablet Commonly known as:  FOLVITE Take 1 tablet (1 mg total) by mouth daily.   furosemide 20 MG tablet Commonly known as:  LASIX Take 20 mg by mouth daily as needed for fluid.   hydrocortisone 1 % ointment Apply topically 2 (two) times daily. Apply to medial right malleolus in a thin layer twice daily   isosorbide mononitrate 60 MG 24 hr tablet Commonly known as:  IMDUR Take 1 tablet (60 mg total) by mouth daily.   LINZESS 145 MCG Caps capsule Generic drug:  linaclotide Take 145 mcg by mouth daily before breakfast.   Melatonin  5 MG Tabs Take 40 mg by mouth at bedtime.   nitroGLYCERIN 0.4 MG SL tablet Commonly known as:  NITROSTAT Place 0.4 mg under the tongue every 5 (five) minutes as needed for chest pain.   ondansetron 8 MG tablet Commonly known as:  ZOFRAN Take 8 mg by mouth 2 (two) times daily.   pantoprazole 40 MG tablet Commonly known as:  PROTONIX Take 40 mg by mouth daily.   RANEXA 1000 MG SR tablet Generic drug:  ranolazine Take 1,000 mg by mouth 2 (two) times daily.   TRAVATAN Z 0.004 % Soln ophthalmic solution Generic drug:  Travoprost (BAK Free) Place 1 drop into both eyes 2 (two) times daily.        Acute coronary syndrome (MI, NSTEMI, STEMI, etc) this admission?: No.    Outstanding Labs/Studies   None  Duration of Discharge Encounter   Greater than 30 minutes including physician time.  Ramond DialSigned, Jacquel Redditt, PA 02/06/2018, 12:56 PM

## 2018-02-09 NOTE — Progress Notes (Signed)
Transitions of Care Follow Up Call Note  Austin Malone is an 78 y.o. male who presented to [facility] on 02/03/2018.  The patient had the following prescriptions filled at [facility]: isosorbide mononitrate, finasteride, clopidogrel, cochicine, ezetimibe, eliquis  Patient was called by pharmacist and HIPAA identifiers were verified. The following questions were asked about the prescriptions filled at [facility]:  Has the patient been experiencing any side effects to the medications prescribed? Yes - pt reports diarrhea while taking 2 laxatives Understanding of regimen: fair Understanding of indications: fair Potential of compliance: good  Pharmacist comments: Counseled patient that Ezetimibe can also cause diarrhea so he can try stopping the 2 laxatives he's been taking and see if that provides relief. Emphasized that he should not stop taking the Ezetimibe as it helps to lower his cholesterol. Patient verbalized understanding.   [x]  Patient's prescriptions with refills filled at the [facility] were transferred to the following pharmacy: [facility] in [place]  []  Patient unable to be reached after calling three times and prescriptions filled at the [facility] were transferred to preferred pharmacy found within their chart.   Melissa A Lore 02/09/2018, 5:54 PM Transitions of Care Pharmacy Hours: Monday - Friday 8:30am to 5:00 PM  Phone - [phone]

## 2018-02-19 ENCOUNTER — Other Ambulatory Visit: Payer: Self-pay

## 2018-02-19 ENCOUNTER — Encounter (HOSPITAL_COMMUNITY): Payer: Self-pay | Admitting: Emergency Medicine

## 2018-02-19 ENCOUNTER — Emergency Department (HOSPITAL_COMMUNITY): Payer: Medicare HMO

## 2018-02-19 ENCOUNTER — Inpatient Hospital Stay (HOSPITAL_COMMUNITY)
Admission: EM | Admit: 2018-02-19 | Discharge: 2018-02-22 | DRG: 378 | Disposition: A | Discharge status: Home Health | Payer: Medicare HMO | Attending: Internal Medicine | Admitting: Internal Medicine

## 2018-02-19 DIAGNOSIS — Z7901 Long term (current) use of anticoagulants: Secondary | ICD-10-CM

## 2018-02-19 DIAGNOSIS — N400 Enlarged prostate without lower urinary tract symptoms: Secondary | ICD-10-CM | POA: Diagnosis present

## 2018-02-19 DIAGNOSIS — Z888 Allergy status to other drugs, medicaments and biological substances status: Secondary | ICD-10-CM

## 2018-02-19 DIAGNOSIS — N179 Acute kidney failure, unspecified: Secondary | ICD-10-CM

## 2018-02-19 DIAGNOSIS — F419 Anxiety disorder, unspecified: Secondary | ICD-10-CM | POA: Diagnosis present

## 2018-02-19 DIAGNOSIS — K449 Diaphragmatic hernia without obstruction or gangrene: Secondary | ICD-10-CM | POA: Diagnosis present

## 2018-02-19 DIAGNOSIS — K922 Gastrointestinal hemorrhage, unspecified: Secondary | ICD-10-CM

## 2018-02-19 DIAGNOSIS — Q438 Other specified congenital malformations of intestine: Secondary | ICD-10-CM | POA: Diagnosis not present

## 2018-02-19 DIAGNOSIS — Z87891 Personal history of nicotine dependence: Secondary | ICD-10-CM

## 2018-02-19 DIAGNOSIS — E785 Hyperlipidemia, unspecified: Secondary | ICD-10-CM | POA: Diagnosis present

## 2018-02-19 DIAGNOSIS — H409 Unspecified glaucoma: Secondary | ICD-10-CM | POA: Diagnosis present

## 2018-02-19 DIAGNOSIS — I252 Old myocardial infarction: Secondary | ICD-10-CM

## 2018-02-19 DIAGNOSIS — D649 Anemia, unspecified: Secondary | ICD-10-CM

## 2018-02-19 DIAGNOSIS — I110 Hypertensive heart disease with heart failure: Secondary | ICD-10-CM | POA: Diagnosis present

## 2018-02-19 DIAGNOSIS — Z7982 Long term (current) use of aspirin: Secondary | ICD-10-CM | POA: Diagnosis not present

## 2018-02-19 DIAGNOSIS — Z951 Presence of aortocoronary bypass graft: Secondary | ICD-10-CM | POA: Diagnosis not present

## 2018-02-19 DIAGNOSIS — K921 Melena: Secondary | ICD-10-CM | POA: Diagnosis present

## 2018-02-19 DIAGNOSIS — I319 Disease of pericardium, unspecified: Secondary | ICD-10-CM | POA: Diagnosis present

## 2018-02-19 DIAGNOSIS — Z955 Presence of coronary angioplasty implant and graft: Secondary | ICD-10-CM | POA: Diagnosis not present

## 2018-02-19 DIAGNOSIS — Z66 Do not resuscitate: Secondary | ICD-10-CM | POA: Diagnosis present

## 2018-02-19 DIAGNOSIS — Z885 Allergy status to narcotic agent status: Secondary | ICD-10-CM

## 2018-02-19 DIAGNOSIS — I2510 Atherosclerotic heart disease of native coronary artery without angina pectoris: Secondary | ICD-10-CM | POA: Diagnosis not present

## 2018-02-19 DIAGNOSIS — E782 Mixed hyperlipidemia: Secondary | ICD-10-CM

## 2018-02-19 DIAGNOSIS — I25110 Atherosclerotic heart disease of native coronary artery with unstable angina pectoris: Secondary | ICD-10-CM | POA: Diagnosis present

## 2018-02-19 DIAGNOSIS — D62 Acute posthemorrhagic anemia: Secondary | ICD-10-CM | POA: Diagnosis present

## 2018-02-19 DIAGNOSIS — Z8711 Personal history of peptic ulcer disease: Secondary | ICD-10-CM

## 2018-02-19 DIAGNOSIS — Z79899 Other long term (current) drug therapy: Secondary | ICD-10-CM

## 2018-02-19 DIAGNOSIS — F1290 Cannabis use, unspecified, uncomplicated: Secondary | ICD-10-CM | POA: Diagnosis present

## 2018-02-19 DIAGNOSIS — R079 Chest pain, unspecified: Secondary | ICD-10-CM | POA: Diagnosis not present

## 2018-02-19 DIAGNOSIS — I5032 Chronic diastolic (congestive) heart failure: Secondary | ICD-10-CM | POA: Diagnosis present

## 2018-02-19 DIAGNOSIS — Z833 Family history of diabetes mellitus: Secondary | ICD-10-CM

## 2018-02-19 DIAGNOSIS — I480 Paroxysmal atrial fibrillation: Secondary | ICD-10-CM | POA: Diagnosis present

## 2018-02-19 DIAGNOSIS — Z91041 Radiographic dye allergy status: Secondary | ICD-10-CM

## 2018-02-19 DIAGNOSIS — I10 Essential (primary) hypertension: Secondary | ICD-10-CM | POA: Diagnosis present

## 2018-02-19 DIAGNOSIS — F1010 Alcohol abuse, uncomplicated: Secondary | ICD-10-CM | POA: Diagnosis present

## 2018-02-19 DIAGNOSIS — Z8249 Family history of ischemic heart disease and other diseases of the circulatory system: Secondary | ICD-10-CM

## 2018-02-19 DIAGNOSIS — R0789 Other chest pain: Secondary | ICD-10-CM

## 2018-02-19 DIAGNOSIS — K219 Gastro-esophageal reflux disease without esophagitis: Secondary | ICD-10-CM | POA: Diagnosis present

## 2018-02-19 DIAGNOSIS — E7800 Pure hypercholesterolemia, unspecified: Secondary | ICD-10-CM | POA: Diagnosis present

## 2018-02-19 HISTORY — DX: Gastrointestinal hemorrhage, unspecified: K92.2

## 2018-02-19 HISTORY — DX: Acute kidney failure, unspecified: N17.9

## 2018-02-19 HISTORY — DX: Anemia, unspecified: D64.9

## 2018-02-19 LAB — CBC WITH DIFFERENTIAL/PLATELET
Abs Immature Granulocytes: 0.03 10*3/uL (ref 0.00–0.07)
Basophils Absolute: 0 10*3/uL (ref 0.0–0.1)
Basophils Relative: 1 %
Eosinophils Absolute: 0.1 10*3/uL (ref 0.0–0.5)
Eosinophils Relative: 1 %
HEMATOCRIT: 22.9 % — AB (ref 39.0–52.0)
Hemoglobin: 7.4 g/dL — ABNORMAL LOW (ref 13.0–17.0)
Immature Granulocytes: 1 %
Lymphocytes Relative: 28 %
Lymphs Abs: 1.5 10*3/uL (ref 0.7–4.0)
MCH: 32.6 pg (ref 26.0–34.0)
MCHC: 32.3 g/dL (ref 30.0–36.0)
MCV: 100.9 fL — ABNORMAL HIGH (ref 80.0–100.0)
Monocytes Absolute: 0.5 10*3/uL (ref 0.1–1.0)
Monocytes Relative: 10 %
Neutro Abs: 3.2 10*3/uL (ref 1.7–7.7)
Neutrophils Relative %: 59 %
Platelets: 218 10*3/uL (ref 150–400)
RBC: 2.27 MIL/uL — ABNORMAL LOW (ref 4.22–5.81)
RDW: 14.4 % (ref 11.5–15.5)
WBC: 5.4 10*3/uL (ref 4.0–10.5)
nRBC: 0 % (ref 0.0–0.2)

## 2018-02-19 LAB — COMPREHENSIVE METABOLIC PANEL
ALT: 24 U/L (ref 0–44)
AST: 22 U/L (ref 15–41)
Albumin: 3.1 g/dL — ABNORMAL LOW (ref 3.5–5.0)
Alkaline Phosphatase: 40 U/L (ref 38–126)
Anion gap: 11 (ref 5–15)
BILIRUBIN TOTAL: 0.5 mg/dL (ref 0.3–1.2)
BUN: 25 mg/dL — ABNORMAL HIGH (ref 8–23)
CO2: 20 mmol/L — ABNORMAL LOW (ref 22–32)
Calcium: 8.7 mg/dL — ABNORMAL LOW (ref 8.9–10.3)
Chloride: 107 mmol/L (ref 98–111)
Creatinine, Ser: 1.37 mg/dL — ABNORMAL HIGH (ref 0.61–1.24)
GFR calc Af Amer: 56 mL/min — ABNORMAL LOW (ref >60)
GFR, EST NON AFRICAN AMERICAN: 49 mL/min — AB (ref >60)
Glucose, Bld: 122 mg/dL — ABNORMAL HIGH (ref 70–99)
Potassium: 4.1 mmol/L (ref 3.5–5.1)
Sodium: 138 mmol/L (ref 135–145)
TOTAL PROTEIN: 5.8 g/dL — AB (ref 6.5–8.1)

## 2018-02-19 LAB — PROTIME-INR
INR: 1.6
Prothrombin Time: 18.8 seconds — ABNORMAL HIGH (ref 11.4–15.2)

## 2018-02-19 LAB — PREPARE RBC (CROSSMATCH)

## 2018-02-19 LAB — TROPONIN I
Troponin I: <0.03 ng/mL (ref <0.03)
Troponin I: <0.03 ng/mL (ref <0.03)

## 2018-02-19 LAB — POC OCCULT BLOOD, ED: Fecal Occult Bld: POSITIVE — AB

## 2018-02-19 MED ORDER — ISOSORBIDE MONONITRATE ER 60 MG PO TB24
60.0000 mg | ORAL_TABLET | Freq: Every day | ORAL | Status: DC
  Administered 2018-02-20 – 2018-02-22 (×3): 60 mg via ORAL
  Filled 2018-02-19 (×3): qty 1

## 2018-02-19 MED ORDER — CARVEDILOL 6.25 MG PO TABS
6.2500 mg | ORAL_TABLET | Freq: Two times a day (BID) | ORAL | Status: DC
  Administered 2018-02-19 – 2018-02-22 (×6): 6.25 mg via ORAL
  Filled 2018-02-19 (×6): qty 1

## 2018-02-19 MED ORDER — PROTHROMBIN COMPLEX CONC HUMAN 500 UNITS IV KIT: 50.0000 [IU]/kg | PACK | Status: DC

## 2018-02-19 MED ORDER — RANOLAZINE ER 500 MG PO TB12
1000.0000 mg | ORAL_TABLET | Freq: Two times a day (BID) | ORAL | Status: DC
  Administered 2018-02-19 – 2018-02-22 (×6): 1000 mg via ORAL
  Filled 2018-02-19 (×6): qty 2

## 2018-02-19 MED ORDER — ONDANSETRON HCL 4 MG PO TABS: 4.0000 mg | ORAL_TABLET | Freq: Four times a day (QID) | ORAL | Status: DC | PRN

## 2018-02-19 MED ORDER — SODIUM CHLORIDE 0.9% IV SOLUTION
Freq: Once | INTRAVENOUS | Status: AC
  Administered 2018-02-19: 23:00:00 via INTRAVENOUS

## 2018-02-19 MED ORDER — NITROGLYCERIN 0.4 MG SL SUBL
SUBLINGUAL_TABLET | SUBLINGUAL | Status: AC
  Filled 2018-02-19: qty 1

## 2018-02-19 MED ORDER — LATANOPROST 0.005 % OP SOLN
1.0000 [drp] | Freq: Two times a day (BID) | OPHTHALMIC | Status: DC
  Filled 2018-02-19: qty 2.5

## 2018-02-19 MED ORDER — MORPHINE SULFATE (PF) 2 MG/ML IV SOLN: 2.0000 mg | INTRAVENOUS | Status: DC | PRN

## 2018-02-19 MED ORDER — PROTHROMBIN COMPLEX CONC HUMAN 500 UNITS IV KIT: 1500.0000 [IU] | PACK | Freq: Once | Status: DC

## 2018-02-19 MED ORDER — HYDROCODONE-ACETAMINOPHEN 5-325 MG PO TABS
1.0000 | ORAL_TABLET | ORAL | Status: DC | PRN
  Administered 2018-02-19: 1 via ORAL
  Filled 2018-02-19: qty 1

## 2018-02-19 MED ORDER — ALPRAZOLAM 0.5 MG PO TABS
0.5000 mg | ORAL_TABLET | Freq: Two times a day (BID) | ORAL | Status: DC | PRN
  Administered 2018-02-19 – 2018-02-21 (×4): 0.5 mg via ORAL
  Filled 2018-02-19 (×4): qty 1

## 2018-02-19 MED ORDER — LATANOPROST 0.005 % OP SOLN
1.0000 [drp] | Freq: Every day | OPHTHALMIC | Status: DC
  Administered 2018-02-21: 1 [drp] via OPHTHALMIC

## 2018-02-19 MED ORDER — DORZOLAMIDE HCL-TIMOLOL MAL 2-0.5 % OP SOLN
1.0000 [drp] | Freq: Two times a day (BID) | OPHTHALMIC | Status: DC
  Administered 2018-02-19 – 2018-02-21 (×5): 1 [drp] via OPHTHALMIC
  Filled 2018-02-19: qty 10

## 2018-02-19 MED ORDER — VITAMIN K1 10 MG/ML IJ SOLN: 10.0000 mg | Freq: Once | INTRAVENOUS | Status: DC

## 2018-02-19 MED ORDER — ACETAMINOPHEN 325 MG PO TABS: 650.0000 mg | ORAL_TABLET | Freq: Four times a day (QID) | ORAL | Status: DC | PRN

## 2018-02-19 MED ORDER — SODIUM CHLORIDE 0.9 % IV SOLN
INTRAVENOUS | Status: DC
  Administered 2018-02-20: 05:00:00 via INTRAVENOUS

## 2018-02-19 MED ORDER — SODIUM CHLORIDE 0.9 % IV SOLN
10.0000 mL/h | Freq: Once | INTRAVENOUS | Status: AC
  Administered 2018-02-19: 10 mL/h via INTRAVENOUS

## 2018-02-19 MED ORDER — ONDANSETRON HCL 4 MG/2ML IJ SOLN
4.0000 mg | Freq: Four times a day (QID) | INTRAMUSCULAR | Status: DC | PRN
  Administered 2018-02-20: 4 mg via INTRAVENOUS
  Filled 2018-02-19: qty 2

## 2018-02-19 MED ORDER — COLCHICINE 0.6 MG PO TABS
0.6000 mg | ORAL_TABLET | Freq: Every day | ORAL | Status: DC
  Administered 2018-02-19 – 2018-02-22 (×4): 0.6 mg via ORAL
  Filled 2018-02-19 (×4): qty 1

## 2018-02-19 MED ORDER — NITROGLYCERIN 0.4 MG SL SUBL
0.4000 mg | SUBLINGUAL_TABLET | SUBLINGUAL | Status: DC | PRN
  Filled 2018-02-19: qty 1

## 2018-02-19 MED ORDER — ACETAMINOPHEN 650 MG RE SUPP: 650.0000 mg | Freq: Four times a day (QID) | RECTAL | Status: DC | PRN

## 2018-02-19 NOTE — H&P (Signed)
Referring Physician: Dr. Kara Paceruotova  Reason for Consultation: Chest pain.   HPI: Austin Malone is a 78 y.o. male with hx of CAD s/p CABG with graft stenosis and prior PCIs to VG to PDA and chronically occluded VG to diag and VG to OM and patent LIMA, pt with increasing angina so [facility]01/18 [facility] and PCI to dRCA and rPL with DES, HLD, HTN, hx MI, PUD admitted with chest pain and malena. He states that he has chest pain that is worse lying down than sitting up. His EKG showed diffuse sagging ST depression s/o pericarditis. He was recently diagnosed with pericarditis after PCI. He has been having malena since discharge. Had significant hemoglobin drop. Currently has 2/10 chest pain.    Review of Systems:     Cardiac Review of Systems: {Y] = yes [ ]  = no  Chest Pain [   y ]  Resting SOB [   ] Exertional SOB  [  ]  Orthopnea [  ]   Pedal Edema [   ]    Palpitations [  ] Syncope  [  ]   Presyncope [   ]  General Review of Systems: [Y] = yes [  ]=no Constitional: recent weight change [  ]; anorexia [  ]; fatigue Cove.Etienne[y  ]; nausea Cove.Etienne[y  ]; night sweats [  ]; fever [  ]; or chills [  ];                                                                     Eyes : blurred vision [  ]; diplopia [   ]; vision changes [  ];  Amaurosis fugax[  ]; Resp: cough [  ];  wheezing[  ];  hemoptysis[  ];  PND [  ];  GI:  gallstones[  ], vomiting[  ];  dysphagia[  ]; melena[y  ];  hematochezia [  ]; heartburn[ y ];   GU: kidney stones [  ]; hematuria[  ];   dysuria [  ];  nocturia[  ]; incontinence [  ];             Skin: rash, swelling[  ];, hair loss[  ];  peripheral edema[  ];  or itching[  ]; Musculosketetal: myalgias[  ];  joint swelling[  ];  joint erythema[  ];  joint pain[  ];  back pain[  ];  Heme/Lymph: bruising[  ];  bleeding[  ];  anemia[  ];  Neuro: TIA[  ];  headaches[  ];  stroke[  ];  vertigo[  ];  seizures[  ];   paresthesias[  ];  difficulty walking[   ];  Psych:depression[  ]; anxiety[  ];    Past Medical History:  Diagnosis Date  . Anxiety   . Arthritis   . BPH (benign prostatic hyperplasia)   . CAD (coronary artery disease)   . Cataracts, bilateral   . Enlarged prostate   . GERD (gastroesophageal reflux disease)   . Glaucoma   . Hyperlipidemia   . Hypertension   . Non-ST elevation (NSTEMI) myocardial infarction (HCC) 08/21/2017  . Pneumonia   . PUD (peptic ulcer disease)   . Rotator cuff tear  Medications Prior to Admission  Medication Sig Dispense Refill  . alfuzosin (UROXATRAL) 10 MG 24 hr tablet Take 1 tablet by mouth daily.    Marland Kitchen ALPRAZolam (XANAX) 0.5 MG tablet Take 0.5 mg by mouth 2 (two) times daily as needed for anxiety or sleep.     Marland Kitchen apixaban (ELIQUIS) 5 MG TABS tablet Take 1 tablet (5 mg total) by mouth 2 (two) times daily. 60 tablet 11  . aspirin EC 81 MG tablet Take 81 mg by mouth daily.    . bethanechol (URECHOLINE) 50 MG tablet Take 50 mg by mouth 3 (three) times daily.    . carvedilol (COREG) 6.25 MG tablet Take 1 tablet by mouth 2 (two) times daily.    . clopidogrel (PLAVIX) 75 MG tablet Take 1 tablet (75 mg total) by mouth daily. 30 tablet 4  . colchicine 0.6 MG tablet Take 1 tablet (0.6 mg total) by mouth 2 (two) times daily. 60 tablet 11  . dorzolamide-timolol (COSOPT) 22.3-6.8 MG/ML ophthalmic solution Place 1 drop into both eyes 2 (two) times daily.    Marland Kitchen ezetimibe (ZETIA) 10 MG tablet Take 1 tablet (10 mg total) by mouth daily. 30 tablet 11  . finasteride (PROSCAR) 5 MG tablet Take 1 tablet (5 mg total) by mouth daily. 30 tablet 4  . hydrocortisone 1 % ointment Apply topically 2 (two) times daily. Apply to medial right malleolus in a thin layer twice daily 30 g 0  . isosorbide mononitrate (IMDUR) 60 MG 24 hr tablet Take 1 tablet (60 mg total) by mouth daily. 30 tablet 4  . nitroGLYCERIN (NITROSTAT) 0.4 MG SL tablet Place 0.4 mg under the tongue every 5 (five) minutes as needed for chest pain.     Marland Kitchen  ondansetron (ZOFRAN) 8 MG tablet Take 8 mg by mouth 2 (two) times daily.     . pantoprazole (PROTONIX) 40 MG tablet Take 40 mg by mouth daily.     . ranolazine (RANEXA) 1000 MG SR tablet Take 1,000 mg by mouth 2 (two) times daily.     . TRAVATAN Z 0.004 % SOLN ophthalmic solution Place 1 drop into both eyes at bedtime.   6     . sodium chloride   Intravenous Once  . carvedilol  6.25 mg Oral BID  . dorzolamide-timolol  1 drop Both Eyes BID  . [START ON 02/20/2018] isosorbide mononitrate  60 mg Oral Daily  . latanoprost  1 drop Both Eyes QHS  . nitroGLYCERIN      . ranolazine  1,000 mg Oral BID    Infusions: . sodium chloride    . sodium chloride      Allergies  Allergen Reactions  . Codeine Other (See Comments)    unknown  . Lisinopril Cough  . Contrast Media [Iodinated Diagnostic Agents] Hives and Rash    Social History   Socioeconomic History  . Marital status: Married    Spouse name: Not on file  . Number of children: Not on file  . Years of education: Not on file  . Highest education level: Not on file  Occupational History  . Occupation: Retired  Engineer, production  . Financial resource strain: Not on file  . Food insecurity:    Worry: Not on file    Inability: Not on file  . Transportation needs:    Medical: Not on file    Non-medical: Not on file  Tobacco Use  . Smoking status: Former Games developer  . Smokeless tobacco: Never Used  Substance  and Sexual Activity  . Alcohol use: Yes    Alcohol/week: 21.0 standard drinks    Types: 21 Cans of beer per week  . Drug use: Yes    Types: Marijuana  . Sexual activity: Not on file  Lifestyle  . Physical activity:    Days per week: Not on file    Minutes per session: Not on file  . Stress: Not on file  Relationships  . Social connections:    Talks on phone: Not on file    Gets together: Not on file    Attends religious service: Not on file    Active member of club or organization: Not on file    Attends meetings of  clubs or organizations: Not on file    Relationship status: Not on file  . Intimate partner violence:    Fear of current or ex partner: Not on file    Emotionally abused: Not on file    Physically abused: Not on file    Forced sexual activity: Not on file  Other Topics Concern  . Not on file  Social History Narrative   He lives in [place], [place] alone.    Family History  Problem Relation Age of Onset  . Diabetes Mother   . Heart attack Brother     PHYSICAL EXAM: Vitals:   02/19/18 2045 02/19/18 2124  BP: (!) 144/73 (!) 141/63  Pulse: 72 70  Resp: 16 16  Temp:  98.4 F (36.9 C)  SpO2: 100% 100%    No intake or output data in the 24 hours ending 02/19/18 2227  General:  Well appearing. No respiratory difficulty HEENT: normal Neck: supple. no JVD. Carotids 2+ bilat; no bruits. No lymphadenopathy or thryomegaly appreciated. Cor: PMI nondisplaced. Regular rate & rhythm. No rubs, gallops or murmurs. Lungs: clear Abdomen: soft, nontender, nondistended. No hepatosplenomegaly. No bruits or masses. Good bowel sounds. Extremities: no cyanosis, clubbing, rash, edema Neuro: alert & oriented x 3, cranial nerves grossly intact. moves all 4 extremities w/o difficulty. Affect pleasant.  ECG: Sinus with ST depr and TWI in inferior and anterolateral leads and sagging STE in aVR.  Results for orders placed or performed during the hospital encounter of 02/19/18 (from the past 24 hour(s))  CBC with Differential/Platelet     Status: Abnormal   Collection Time: 02/19/18  5:29 PM  Result Value Ref Range   WBC 5.4 4.0 - 10.5 K/uL   RBC 2.27 (L) 4.22 - 5.81 MIL/uL   Hemoglobin 7.4 (L) 13.0 - 17.0 g/dL   HCT 92.4 (L) 26.8 - 34.1 %   MCV 100.9 (H) 80.0 - 100.0 fL   MCH 32.6 26.0 - 34.0 pg   MCHC 32.3 30.0 - 36.0 g/dL   RDW 96.2 22.9 - 79.8 %   Platelets 218 150 - 400 K/uL   nRBC 0.0 0.0 - 0.2 %   Neutrophils Relative % 59 %   Neutro Abs 3.2 1.7 - 7.7 K/uL   Lymphocytes  Relative 28 %   Lymphs Abs 1.5 0.7 - 4.0 K/uL   Monocytes Relative 10 %   Monocytes Absolute 0.5 0.1 - 1.0 K/uL   Eosinophils Relative 1 %   Eosinophils Absolute 0.1 0.0 - 0.5 K/uL   Basophils Relative 1 %   Basophils Absolute 0.0 0.0 - 0.1 K/uL   Immature Granulocytes 1 %   Abs Immature Granulocytes 0.03 0.00 - 0.07 K/uL  Comprehensive metabolic panel     Status: Abnormal   Collection Time:  02/19/18  5:29 PM  Result Value Ref Range   Sodium 138 135 - 145 mmol/L   Potassium 4.1 3.5 - 5.1 mmol/L   Chloride 107 98 - 111 mmol/L   CO2 20 (L) 22 - 32 mmol/L   Glucose, Bld 122 (H) 70 - 99 mg/dL   BUN 25 (H) 8 - 23 mg/dL   Creatinine, Ser 1.61 (H) 0.61 - 1.24 mg/dL   Calcium 8.7 (L) 8.9 - 10.3 mg/dL   Total Protein 5.8 (L) 6.5 - 8.1 g/dL   Albumin 3.1 (L) 3.5 - 5.0 g/dL   AST 22 15 - 41 U/L   ALT 24 0 - 44 U/L   Alkaline Phosphatase 40 38 - 126 U/L   Total Bilirubin 0.5 0.3 - 1.2 mg/dL   GFR calc non Af Amer 49 (L) >60 mL/min   GFR calc Af Amer 56 (L) >60 mL/min   Anion gap 11 5 - 15  Troponin I - ONCE - STAT     Status: None   Collection Time: 02/19/18  5:29 PM  Result Value Ref Range   Troponin I <0.03 <0.03 ng/mL  POC occult blood, ED     Status: Abnormal   Collection Time: 02/19/18  6:39 PM  Result Value Ref Range   Fecal Occult Bld POSITIVE (A) NEGATIVE  Type and screen     Status: None (Preliminary result)   Collection Time: 02/19/18  8:55 PM  Result Value Ref Range   ABO/RH(D) A POS    Antibody Screen NEG    Sample Expiration 02/22/2018    Unit Number [id number]    Blood Component Type RED CELLS,LR    Unit division 00    Status of Unit ALLOCATED    Transfusion Status OK TO TRANSFUSE    Crossmatch Result      Compatible Performed at [facility], [address] [place], [place] [address]    Unit Number [id number]    Blood Component Type RED CELLS,LR    Unit division 00    Status of Unit ALLOCATED    Transfusion Status OK TO TRANSFUSE    Crossmatch  Result Compatible    Unit Number [id number]    Blood Component Type RED CELLS,LR    Unit division 00    Status of Unit ALLOCATED    Transfusion Status OK TO TRANSFUSE    Crossmatch Result Compatible   Prepare RBC     Status: None   Collection Time: 02/19/18  8:55 PM  Result Value Ref Range   Order Confirmation      ORDER PROCESSED BY BLOOD BANK Performed at [facility], [address], [place], [place] [address]    Dg Chest 2 View  Result Date: 02/19/2018 CLINICAL DATA:  78 year old male with chest pain, nausea and vomiting. EXAM: CHEST - 2 VIEW COMPARISON:  [facility] portable chest x-ray 02/08/2018 and earlier. FINDINGS: Semi upright AP and lateral views. Stable sequelae of CABG. Mildly lower lung volumes compared to prior two view exams. Mediastinal contours remain within normal limits. Visualized tracheal air column is within normal limits. No pneumothorax or pulmonary edema. Small chronic calcified granuloma in the right lung near the minor fissure. No pleural effusion or confluent pulmonary opacity. No acute osseous abnormality identified. Negative visible bowel gas pattern. IMPRESSION: No acute cardiopulmonary abnormality. Electronically Signed   By: Odessa Fleming M.D.   On: 02/19/2018 18:06     ASSESSMENT and Plan: Chest pain secondary to pericarditis: Diffuse changes  in EKG with persistent chest pain and cold sweats would suggest pericarditis. Will start on colchicine since patient has Malena and NSAID's and steroids cannot be used.  CAD s/p CABG: Chest pain is not typical and trops are negative. Patient has significant Hb drop with malena and hence will hold off the DAPT and Apixaban weighing the risk and benefits. Patient explained that there is a risk of stent thrombosis and mortality form that is 20-40%. However, in this situation, hist treatment for malena takes precedence and resuming DAPT and apixaban would be decided by GI. Patient is agreeable and plan conveyed to  Dr. Kara Pacer.  Malena: Per primary.

## 2018-02-19 NOTE — ED Notes (Signed)
Patient transported to X-ray 

## 2018-02-19 NOTE — ED Notes (Signed)
Family at bedside. 

## 2018-02-19 NOTE — ED Provider Notes (Signed)
[facility] EMERGENCY DEPARTMENT Provider Note   CSN: [id number] Arrival date & time: 02/19/18  1649     History   Chief Complaint Chief Complaint  Patient presents with  . Chest Pain    HPI Tina Temme is a 78 y.o. male.  HPI Patient with prior history of CAD status post CABG presents with episodic central chest pain which he describes as aching.  Pain is worse with lying flat and improved with leaning forward.  Had a pain earlier this afternoon but denies any pain currently.  States he has been having chills and diaphoresis at night for the past several days.  Recently admitted for pericarditis.  No known fevers or chills.  No new lower extremity swelling or pain.  No shortness of breath or cough. Past Medical History:  Diagnosis Date  . Anxiety   . Arthritis   . BPH (benign prostatic hyperplasia)   . CAD (coronary artery disease)   . Cataracts, bilateral   . Enlarged prostate   . GERD (gastroesophageal reflux disease)   . Glaucoma   . Hyperlipidemia   . Hypertension   . Non-ST elevation (NSTEMI) myocardial infarction (HCC) 08/21/2017  . Pneumonia   . PUD (peptic ulcer disease)   . Rotator cuff tear     Patient Active Problem List   Diagnosis Date Noted  . Upper GI bleed 02/19/2018  . Symptomatic anemia 02/19/2018  . AKI (acute kidney injury) (HCC) 02/19/2018  . Pericarditis 02/03/2018  . Unstable angina (HCC) 01/31/2018  . Chronic diastolic CHF (congestive heart failure) (HCC) 01/31/2018  . Chest pain at rest 09/29/2017  . Pre-operative clearance 09/08/2017  . Alcohol abuse 08/22/2017  . Anxiety 08/22/2017  . CAD (coronary artery disease) 08/18/2017  . BPH (benign prostatic hyperplasia) 07/21/2017  . Gastroesophageal reflux disease without esophagitis 07/21/2017  . 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 Hyperlipidemia 07/21/2017  . Hypertension 07/21/2017  . 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 Gallbladder sludge 04/01/2015     Past Surgical History:  Procedure Laterality Date  . APPENDECTOMY    . CORONARY ANGIOPLASTY WITH STENT PLACEMENT  11/2014   PCI of SVG to PDA x 2 with Xience DES to proximal stenosis, Ultra 5.0 BMS to distal thrombotic lesion  . CORONARY ARTERY BYPASS GRAFT  2008  . CORONARY ATHERECTOMY N/A 02/01/2018   Procedure: CORONARY ATHERECTOMY;  Surgeon: Yvonne Kendall, MD;  Location: [facility] INVASIVE CV LAB;  Service: Cardiovascular;  Laterality: N/A;  . CORONARY STENT INTERVENTION N/A 02/01/2018   Procedure: CORONARY STENT INTERVENTION;  Surgeon: Yvonne Kendall, MD;  Location: [facility] INVASIVE CV LAB;  Service: Cardiovascular;  Laterality: N/A;  . EYE SURGERY    . INTRAVASCULAR ULTRASOUND/IVUS N/A 02/01/2018   Procedure: Intravascular Ultrasound/IVUS;  Surgeon: Yvonne Kendall, MD;  Location: [facility] INVASIVE CV LAB;  Service: Cardiovascular;  Laterality: N/A;  . LEFT HEART CATH AND CORS/GRAFTS ANGIOGRAPHY N/A 08/21/2017   Procedure: LEFT HEART CATH AND CORS/GRAFTS ANGIOGRAPHY;  Surgeon: Yvonne Kendall, MD;  Location: [facility] INVASIVE CV LAB;  Service: Cardiovascular;  Laterality: N/A;  . LEFT HEART CATH AND CORS/GRAFTS ANGIOGRAPHY N/A 02/01/2018   Procedure: LEFT HEART CATH AND CORS/GRAFTS ANGIOGRAPHY;  Surgeon: Yvonne Kendall, MD;  Location: [facility] INVASIVE CV LAB;  Service: Cardiovascular;  Laterality: N/A;  . LEG SURGERY    . TONSILLECTOMY          Home Medications    Prior to Admission medications   Medication Sig Start Date End Date Taking? Authorizing Provider  alfuzosin (  UROXATRAL) 10 MG 24 hr tablet Take 1 tablet by mouth daily.   Yes [provider]  ALPRAZolam Prudy Feeler) 0.5 MG tablet Take 0.5 mg by mouth 2 (two) times daily as needed for anxiety or sleep.  05/10/14  Yes [provider]  apixaban (ELIQUIS) 5 MG TABS tablet Take 1 tablet (5 mg total) by mouth 2 (two) times daily. 02/05/18  Yes Georgie Chard D, NP  aspirin EC 81 MG tablet Take 81 mg by mouth daily.   Yes [provider]  bethanechol (URECHOLINE) 50 MG tablet Take 50 mg by mouth 3 (three) times daily.   Yes [provider]  carvedilol (COREG) 6.25 MG tablet Take 1 tablet by mouth 2 (two) times daily. 09/01/16  Yes [provider]  clopidogrel (PLAVIX) 75 MG tablet Take 1 tablet (75 mg total) by mouth daily. 02/02/18  Yes Rai, Ripudeep K, MD  colchicine 0.6 MG tablet Take 1 tablet (0.6 mg total) by mouth 2 (two) times daily. 02/05/18  Yes Georgie Chard D, NP  dorzolamide-timolol (COSOPT) 22.3-6.8 MG/ML ophthalmic solution Place 1 drop into both eyes 2 (two) times daily. 01/29/18  Yes [provider]  ezetimibe (ZETIA) 10 MG tablet Take 1 tablet (10 mg total) by mouth daily. 02/06/18  Yes Georgie Chard D, NP  finasteride (PROSCAR) 5 MG tablet Take 1 tablet (5 mg total) by mouth daily. 02/02/18  Yes Rai, Ripudeep K, MD  hydrocortisone 1 % ointment Apply topically 2 (two) times daily. Apply to medial right malleolus in a thin layer twice daily 02/02/18  Yes Rai, Ripudeep K, MD  isosorbide mononitrate (IMDUR) 60 MG 24 hr tablet Take 1 tablet (60 mg total) by mouth daily. 02/02/18  Yes Rai, Ripudeep K, MD  nitroGLYCERIN (NITROSTAT) 0.4 MG SL tablet Place 0.4 mg under the tongue every 5 (five) minutes as needed for chest pain.  03/10/16  Yes [provider]  ondansetron (ZOFRAN) 8 MG tablet Take 8 mg by mouth 2 (two) times daily.    Yes [provider]  pantoprazole (PROTONIX) 40 MG tablet Take 40 mg by mouth daily.    Yes [provider]  ranolazine (RANEXA) 1000 MG SR tablet Take 1,000 mg by mouth 2 (two) times daily.  02/25/16  Yes [provider]  TRAVATAN Z 0.004 % SOLN ophthalmic solution Place 1 drop into both eyes at bedtime.  06/24/17  Yes [provider]    Family History Family History  Problem Relation Age of Onset  . Diabetes Mother   . Heart attack Brother     Social History Social History   Tobacco Use  . Smoking status:  Former Games developer  . Smokeless tobacco: Never Used  Substance Use Topics  . Alcohol use: Yes    Alcohol/week: 21.0 standard drinks    Types: 21 Cans of beer per week  . Drug use: Yes    Types: Marijuana     Allergies   Codeine; Lisinopril; and Contrast media [iodinated diagnostic agents]   Review of Systems Review of Systems  Constitutional: Positive for chills, diaphoresis and fatigue. Negative for fever.  HENT: Negative for sore throat and trouble swallowing.   Respiratory: Negative for cough and shortness of breath.   Cardiovascular: Positive for chest pain. Negative for palpitations and leg swelling.  Gastrointestinal: Negative for abdominal pain, constipation, diarrhea, nausea and vomiting.  Genitourinary: Negative for dysuria, flank pain and frequency.  Musculoskeletal: Negative for back pain, myalgias and neck pain.  Skin: Negative for  rash and wound.  Neurological: Negative for dizziness, weakness, light-headedness, numbness and headaches.  All other systems reviewed and are negative.    Physical Exam Updated Vital Signs BP (!) 158/67 (BP Location: Left Arm)   Pulse 72   Temp 98.4 F (36.9 C) (Oral)   Resp 20   Ht 5\' 10"  (1.778 m)   Wt 83.5 kg   SpO2 98%   BMI 26.41 kg/m   Physical Exam Vitals signs and nursing note reviewed.  Constitutional:      General: He is not in acute distress.    Appearance: Normal appearance. He is well-developed. He is not ill-appearing.  HENT:     Head: Normocephalic and atraumatic.     Nose: Nose normal. No congestion.     Mouth/Throat:     Mouth: Mucous membranes are moist.     Pharynx: No oropharyngeal exudate or posterior oropharyngeal erythema.  Eyes:     Pupils: Pupils are equal, round, and reactive to light.  Neck:     Musculoskeletal: Normal range of motion and neck supple. No neck rigidity or muscular tenderness.  Cardiovascular:     Rate and Rhythm: Normal rate and regular rhythm.     Heart sounds: Friction rub  present.     Comments: Mildly diminished cardiac sounds. Pulmonary:     Effort: Pulmonary effort is normal. No respiratory distress.     Breath sounds: Normal breath sounds. No stridor. No wheezing, rhonchi or rales.  Abdominal:     General: Bowel sounds are normal. There is no distension.     Palpations: Abdomen is soft. There is no mass.     Tenderness: There is no abdominal tenderness. There is no guarding or rebound.  Musculoskeletal: Normal range of motion.        General: No swelling, tenderness or deformity.     Right lower leg: No edema.     Left lower leg: No edema.  Lymphadenopathy:     Cervical: No cervical adenopathy.  Skin:    General: Skin is warm and dry.     Capillary Refill: Capillary refill takes less than 2 seconds.     Findings: No erythema or rash.  Neurological:     General: No focal deficit present.     Mental Status: He is alert and oriented to person, place, and time.  Psychiatric:        Mood and Affect: Mood normal.        Behavior: Behavior normal.      ED Treatments / Results  Labs (all labs ordered are listed, but only abnormal results are displayed) Labs Reviewed  CBC WITH DIFFERENTIAL/PLATELET - Abnormal; Notable for the following components:      Result Value   RBC 2.27 (*)    Hemoglobin 7.4 (*)    HCT 22.9 (*)    MCV 100.9 (*)    All other components within normal limits  COMPREHENSIVE METABOLIC PANEL - Abnormal; Notable for the following components:   CO2 20 (*)    Glucose, Bld 122 (*)    BUN 25 (*)    Creatinine, Ser 1.37 (*)    Calcium 8.7 (*)    Total Protein 5.8 (*)    Albumin 3.1 (*)    GFR calc non Af Amer 49 (*)    GFR calc Af Amer 56 (*)    All other components within normal limits  COMPREHENSIVE METABOLIC PANEL - Abnormal; Notable for the following components:   CO2 21 (*)  BUN 24 (*)    Creatinine, Ser 1.25 (*)    Calcium 8.1 (*)    Total Protein 5.2 (*)    Albumin 2.9 (*)    Alkaline Phosphatase 37 (*)    GFR  calc non Af Amer 54 (*)    All other components within normal limits  CBC - Abnormal; Notable for the following components:   RBC 2.90 (*)    Hemoglobin 9.1 (*)    HCT 27.4 (*)    RDW 15.9 (*)    All other components within normal limits  HEMOGLOBIN A1C - Abnormal; Notable for the following components:   Hgb A1c MFr Bld 5.8 (*)    All other components within normal limits  PROTIME-INR - Abnormal; Notable for the following components:   Prothrombin Time 18.8 (*)    All other components within normal limits  CBC - Abnormal; Notable for the following components:   RBC 3.06 (*)    Hemoglobin 9.6 (*)    HCT 29.3 (*)    RDW 16.0 (*)    All other components within normal limits  BASIC METABOLIC PANEL - Abnormal; Notable for the following components:   CO2 19 (*)    Glucose, Bld 102 (*)    Calcium 8.6 (*)    GFR calc non Af Amer 60 (*)    All other components within normal limits  POC OCCULT BLOOD, ED - Abnormal; Notable for the following components:   Fecal Occult Bld POSITIVE (*)    All other components within normal limits  MRSA PCR SCREENING  TROPONIN I  MAGNESIUM  PHOSPHORUS  TSH  TROPONIN I  TROPONIN I  TROPONIN I  TYPE AND SCREEN  PREPARE RBC (CROSSMATCH)    EKG EKG Interpretation  Date/Time:  Friday February 19 2018 20:12:48 EST Ventricular Rate:  72 PR Interval:    QRS Duration: 102 QT Interval:  400 QTC Calculation: 438 R Axis:   -11 Text Interpretation:  Sinus rhythm Repol abnrm suggests ischemia, lateral leads Confirmed by Zadie RhineWickline, Donald ([id number]) on 02/20/2018 2:15:00 PM   Radiology Dg Chest 2 View  Result Date: 02/19/2018 CLINICAL DATA:  78 year old male with chest pain, nausea and vomiting. EXAM: CHEST - 2 VIEW [facility]2018 and earlier. FINDINGS: Semi upright AP and lateral views. Stable sequelae of CABG. Mildly lower lung volumes compared to prior two view exams. Mediastinal contours remain within normal  limits. Visualized tracheal air column is within normal limits. No pneumothorax or pulmonary edema. Small chronic calcified granuloma in the right lung near the minor fissure. No pleural effusion or confluent pulmonary opacity. No acute osseous abnormality identified. Negative visible bowel gas pattern. IMPRESSION: No acute cardiopulmonary abnormality. Electronically Signed   By: Odessa FlemingH  Hall M.D.   On: 02/19/2018 18:06    Procedures Procedures (including critical care time)  Medications Ordered in ED Medications  nitroGLYCERIN (NITROSTAT) 0.4 MG SL tablet (0.4 mg  Not Given 02/19/18 2120)  nitroGLYCERIN (NITROSTAT) SL tablet 0.4 mg ( Sublingual MAR Unhold 02/21/18 0916)  morphine 2 MG/ML injection 2 mg ( Intravenous MAR Unhold 02/21/18 0916)  isosorbide mononitrate (IMDUR) 24 hr tablet 60 mg (60 mg Oral Given 02/21/18 0953)  carvedilol (COREG) tablet 6.25 mg (6.25 mg Oral Given 02/21/18 0953)  ranolazine (RANEXA) 12 hr tablet 1,000 mg (1,000 mg Oral Given 02/21/18 0953)  ALPRAZolam (XANAX) tablet 0.5 mg ( Oral MAR Unhold 02/21/18 0916)  dorzolamide-timolol (COSOPT) 22.3-6.8 MG/ML ophthalmic solution 1 drop (1 drop Both  Eyes Given 02/21/18 1041)  acetaminophen (TYLENOL) tablet 650 mg ( Oral MAR Unhold 02/21/18 0916)    Or  acetaminophen (TYLENOL) suppository 650 mg ( Rectal MAR Unhold 02/21/18 0916)  HYDROcodone-acetaminophen (NORCO/VICODIN) 5-325 MG per tablet 1-2 tablet ( Oral MAR Unhold 02/21/18 0916)  ondansetron (ZOFRAN) tablet 4 mg ( Oral MAR Unhold 02/21/18 0916)    Or  ondansetron (ZOFRAN) injection 4 mg ( Intravenous MAR Unhold 02/21/18 0916)  latanoprost (XALATAN) 0.005 % ophthalmic solution 1 drop ( Both Eyes MAR Unhold 02/21/18 0916)  colchicine tablet 0.6 mg (0.6 mg Oral Given 02/21/18 0953)  finasteride (PROSCAR) tablet 5 mg (5 mg Oral Given 02/21/18 0953)  alfuzosin (UROXATRAL) 24 hr tablet 10 mg (10 mg Oral Given 02/21/18 0953)  pantoprazole (PROTONIX) EC tablet 40 mg (40 mg Oral Given 02/21/18 0954)  clopidogrel  (PLAVIX) tablet 75 mg (75 mg Oral Given 02/21/18 1454)  aspirin EC tablet 81 mg (81 mg Oral Given 02/21/18 1454)  0.9 %  sodium chloride infusion (10 mL/hr Intravenous New Bag/Given 02/19/18 2246)  0.9 %  sodium chloride infusion (Manually program via Guardrails IV Fluids) ( Intravenous New Bag/Given 02/19/18 2246)  pantoprazole (PROTONIX) 80 mg in sodium chloride 0.9 % 100 mL IVPB (80 mg Intravenous New Bag/Given 02/20/18 0130)  polyethylene glycol (GoLYTELY/NuLYTELY) solution 4,000 mL (4,000 mLs Oral Given 02/20/18 1759)   CRITICAL CARE Performed by: Loren Racer Total critical care time: 35 minutes Critical care time was exclusive of separately billable procedures and treating other patients. Critical care was necessary to treat or prevent imminent or life-threatening deterioration. Critical care was time spent personally by me on the following activities: development of treatment plan with patient and/or surrogate as well as nursing, discussions with consultants, evaluation of patient's response to treatment, examination of patient, obtaining history from patient or surrogate, ordering and performing treatments and interventions, ordering and review of laboratory studies, ordering and review of radiographic studies, pulse oximetry and re-evaluation of patient's condition.  Initial Impression / Assessment and Plan / ED Course  I have reviewed the triage vital signs and the nursing notes.  Pertinent labs & imaging results that were available during my care of the patient were reviewed by me and considered in my medical decision making (see chart for details).     Melanotic stools that are Hemoccult positive.  Significant drop in hemoglobin.  Patient symptoms likely represent symptomatic anemia.  Initiated blood transfusion and will admit to hospitalist.  Also start PPI bolus and infusion.  Final Clinical Impressions(s) / ED Diagnoses   Final diagnoses:  Upper GI bleed  Nonspecific chest pain     ED Discharge Orders    None       Loren Racer, MD 02/21/18 1515

## 2018-02-19 NOTE — ED Triage Notes (Signed)
Pt BIB [facility], c/o chest pain and cold chills x 3 days. Afebrile, hx MI with stents.

## 2018-02-19 NOTE — Progress Notes (Signed)
Pt arrived to [address]. As he moved from stretcher to bed, pt started c/o chest pain 8/10 described as aching in his upper left chest. Pain radiating to both arms. NP on call paged, EKG completed, VS stable, pt in no respiratory distress.  By the time RN got nitroglycerin, in approximately 5 min, pt verbalized his pain got down to 4/10. Soon after that pain was rated 0/10. EKG completed and NP on call notified about results being available in Epic for review.

## 2018-02-19 NOTE — H&P (Signed)
Austin Malone ZOX:[id number] DOB: 1940-07-28 DOA: 02/19/2018     PCP: Shelbie Ammons, MD   Outpatient Specialists:   CARDS:  Garwin Brothers, MD  Dr. Swaziland    Patient arrived to ER on 02/19/18 at 1649  Patient coming from: home       Chief Complaint:  Chief Complaint  Patient presents with  . Chest Pain   MD requesting admission Dr. Ranae Palms  HPI: Austin Malone is a 78 y.o. male with medical history significant of CAD, sp of CABG recent pericarditis, HLD, HTN, chronic diastolic CHF,  alcohol abuse, PUD, chronic foley catheter, chronic anemia recently new diagnosis of A. Fib  On eliquis    Presented with  Chest pain and feeling cold x 3 days. NO fever Pain has been coming and going and feeling like aching.  Worse with lying flat and improved with leaning forward.  No current chest pain but had an episode earlier this afternoon.  He has been having occasional diaphoresis at night for the past few days.  No lower extremity edema no shortness of breath or cough. Patient called EMS and was brought into [facility] called by [facility]  Hx of recent pericarditis associated with Diaphoresis Recently had melena  He is on triple therapy for recent DES stent as well as history of atrial fibrillation.  He is on Eliquis aspirin 81 mg and Plavix 75 a day.  Is also on Ranexa.  Patient has known history of PUD and takes Protonix a day. For His recent   pericarditis patient is on colchicine  He drinks under occasion but denies any recent withdrawals.  Reports melena for past 1 week or more ever since his discharge no bright red blood per rectum no hematemesis  Regarding pertinent Chronic problems:    Re CAD graft stenosis and prior PCIs to VG to PDA and chronically occluded VG to diag and VG to OM and patent LIMA,attempt to manage medically but pt with increasing angina so 02/01/18 had PCI to [facility] with orbital atherectomy and DES and PCI to dRCA and rPL with DES Initially was able to  be discharged to home on 21 January But developed pericardial pain and elevated troponin presented  to [facility] and was transferred back [facility] chest pain was felt to be consistent with pericarditis,  It was decided to avoid NSAIDs and steroid given the need for triple therapy.  He was placed on colchicine 0.6 mg twice daily.  Echocardiogram obtained on 02/04/2018 showed a normal wall motion, no significant pericardial effusion, EF 55 to 60%.  Patient was seen by Dr. Swaziland, at this time there was no plan for relook intervention given improvement of symptoms. HE developed New onset Afib:  Pt currently in NSR, converted to NSR spontaneously on Eliquis.  He will continue on aspirin, Plavix and Eliquis with plan to stop aspirin after 03/04/2018.  Discharged home on 25 January   Echo 02/04/2018 LV EF: 55% - 60% grade 1 diastolic dysfunction).  While in ER: Hem occult positive Hg went 10.5 to 7.4  Initially complete reversal was considered but was discontinued as patient have not had any acute changes and have had melena for the past few days with no escalation of his bleeding and currently hemodynamically stable  The following Work up has been ordered so far:  Orders Placed This Encounter  Procedures  . DG Chest 2 View  . CBC with Differential/Platelet  . Comprehensive metabolic panel  . Troponin I -  ONCE - STAT  .  Follow Oral Anticoagulation Rapid Reversal Protocol  . Complete patient signature process for consent form  . Practitioner attestation of consent  . Check STAT INR 60 minutes after Kcentra administered  . After Kcentra administration, check PT/INR every 6 hours x 4, then daily x 2  . If INR>/=1.5 during 1st 48 hours, contact physician for further orders  . Consult for [facility] Admission  . Consult to gastroenterology  . post Kcentra/Feiba/Praxbind pharmacy monitoring  . POC occult blood, ED  . EKG 12-Lead  . Type and screen  . Prepare RBC   Following  Medications were ordered in ER: Medications  0.9 %  sodium chloride infusion (has no administration in time range)  prothrombin complex conc human (KCENTRA) IVPB 1,500 Units (has no administration in time range)  phytonadione (VITAMIN K) 10 mg in dextrose 5 % 50 mL IVPB (has no administration in time range)  prothrombin complex conc human (KCENTRA) IVPB 4,175 Units (has no administration in time range)    Significant initial  Findings: Abnormal Labs Reviewed  CBC WITH DIFFERENTIAL/PLATELET - Abnormal; Notable for the following components:      Result Value   RBC 2.27 (*)    Hemoglobin 7.4 (*)    HCT 22.9 (*)    MCV 100.9 (*)    All other components within normal limits  COMPREHENSIVE METABOLIC PANEL - Abnormal; Notable for the following components:   CO2 20 (*)    Glucose, Bld 122 (*)    BUN 25 (*)    Creatinine, Ser 1.37 (*)    Calcium 8.7 (*)    Total Protein 5.8 (*)    Albumin 3.1 (*)    GFR calc non Af Amer 49 (*)    GFR calc Af Amer 56 (*)    All other components within normal limits  POC OCCULT BLOOD, ED - Abnormal; Notable for the following components:   Fecal Occult Bld POSITIVE (*)    All other components within normal limits     Lactic Acid, Venous No results found for: LATICACIDVEN  Na 138 K 4.1  Cr    Up from baseline see below Lab Results  Component Value Date   CREATININE 1.37 (H) 02/19/2018   CREATININE 1.19 02/05/2018   CREATININE 1.17 02/04/2018    BUN 25 up from baseline as well  WBC  5.4  HG/HCT  Down from baseline see below    Component Value Date/Time   HGB 7.4 (L) 02/19/2018 1729   HCT 22.9 (L) 02/19/2018 1729    Troponin (Point of Care Test) No results for input(s): TROPIPOC in the last 72 hours. < 0.03    BNP (last 3 results) Recent Labs    02/03/18 2140  BNP 369.7*    ProBNP (last 3 results) No results for input(s): PROBNP in the last 8760 hours.     UA  not ordered    CXR - NON acute   ECG:  Personally reviewed  by me showing: HR : 66 Rhythm: NSR,   nonspecific changes,   QTC 450      ED Triage Vitals  Enc Vitals Group     BP 02/19/18 1715 (!) 121/58     Pulse Rate 02/19/18 1715 64     Resp 02/19/18 1715 13     Temp 02/19/18 1716 97.7 F (36.5 C)     Temp Source 02/19/18 1716 Oral     SpO2 02/19/18 1715 100 %  Weight --      Height --      Head Circumference --      Peak Flow --      Pain Score 02/19/18 1716 0     Pain Loc --      Pain Edu? --      Excl. in GC? --   TMAX(24)@       Latest  Blood pressure (!) 109/47, pulse 72, temperature 97.7 F (36.5 C), temperature source Oral, resp. rate 20, SpO2 100 %.    ER Provider Called:   Cardiology   Dr. Gala Romney They Recommend admit to medicine will see in consult at this point will need to hold off on triple therapy risk discussed with patient Will see on the floor I Called:   GI   Dr.Dennis with [facility] Will see in AM    Hospitalist was called for admission for symptomatic anemia with likely upper GI bleed in the setting of triple therapy and recent unstable angina status post DES stenting complicated by pericarditis   Review of Systems:    Pertinent positives include: chills, fatigue, chest pain,  Constitutional:  No weight loss, night sweats, Fevers,  weight loss  HEENT:  No headaches, Difficulty swallowing,Tooth/dental problems,Sore throat,  No sneezing, itching, ear ache, nasal congestion, post nasal drip,  Cardio-vascular:  No  Orthopnea, PND, anasarca, dizziness, palpitations.no Bilateral lower extremity swelling  GI:  No heartburn, indigestion, abdominal pain, nausea, vomiting, diarrhea, change in bowel habits, loss of appetite, melena, blood in stool, hematemesis Resp:  no shortness of breath at rest. No dyspnea on exertion, No excess mucus, no productive cough, No non-productive cough, No coughing up of blood.No change in color of mucus.No wheezing. Skin:  no rash or lesions. No jaundice GU:  no dysuria,  change in color of urine, no urgency or frequency. No straining to urinate.  No flank pain.  Musculoskeletal:  No joint pain or no joint swelling. No decreased range of motion. No back pain.  Psych:  No change in mood or affect. No depression or anxiety. No memory loss.  Neuro: no localizing neurological complaints, no tingling, no weakness, no double vision, no gait abnormality, no slurred speech, no confusion  All systems reviewed and apart from HOPI all are negative  Past Medical History:   Past Medical History:  Diagnosis Date  . Anxiety   . Arthritis   . BPH (benign prostatic hyperplasia)   . CAD (coronary artery disease)   . Cataracts, bilateral   . Enlarged prostate   . GERD (gastroesophageal reflux disease)   . Glaucoma   . Hyperlipidemia   . Hypertension   . Non-ST elevation (NSTEMI) myocardial infarction (HCC) 08/21/2017  . Pneumonia   . PUD (peptic ulcer disease)   . Rotator cuff tear       Past Surgical History:  Procedure Laterality Date  . APPENDECTOMY    . CORONARY ANGIOPLASTY WITH STENT PLACEMENT  11/2014   PCI of SVG to PDA x 2 with Xience DES to proximal stenosis, Ultra 5.0 BMS to distal thrombotic lesion  . CORONARY ARTERY BYPASS GRAFT  2008  . CORONARY ATHERECTOMY N/A 02/01/2018   Procedure: CORONARY ATHERECTOMY;  Surgeon: Yvonne Kendall, MD;  Location: [facility] INVASIVE [facility];  Service: Cardiovascular;  Laterality: N/A;  . CORONARY STENT INTERVENTION N/A 02/01/2018   Procedure: CORONARY STENT INTERVENTION;  Surgeon: Yvonne Kendall, MD;  Location: [facility] INVASIVE CV LAB;  Service: Cardiovascular;  Laterality: N/A;  . EYE SURGERY    .  INTRAVASCULAR ULTRASOUND/IVUS N/A 02/01/2018   Procedure: Intravascular Ultrasound/IVUS;  Surgeon: Yvonne Kendall, MD;  Location: [facility]VASIVE CV LAB;  Service: Cardiovascular;  Laterality: N/A;  . LEFT HEART CATH AND CORS/GRAFTS ANGIOGRAPHY N/A 08/21/2017   Procedure: LEFT HEART CATH AND CORS/GRAFTS ANGIOGRAPHY;  Surgeon: Yvonne Kendall, MD;  Location: [facility] INVASIVE CV LAB;  Service: Cardiovascular;  Laterality: N/A;  . LEFT HEART CATH AND CORS/GRAFTS ANGIOGRAPHY N/A 02/01/2018   Procedure: LEFT HEART CATH AND CORS/GRAFTS ANGIOGRAPHY;  Surgeon: Yvonne Kendall, MD;  Location: [facility];  Service: Cardiovascular;  Laterality: N/A;  . LEG SURGERY    . TONSILLECTOMY      Social History:  Ambulatory   Independently     reports that he has quit smoking. He has never used smokeless tobacco. He reports current alcohol use of about 21.0 standard drinks of alcohol per week. He reports current drug use. Drug: Marijuana.     Family History:   Family History  Problem Relation Age of Onset  . Diabetes Mother   . Heart attack Brother     Allergies: Allergies  Allergen Reactions  . Codeine Other (See Comments)    unknown  . Lisinopril Cough  . Contrast Media [Iodinated Diagnostic Agents] Hives and Rash     Prior to Admission medications   Medication Sig Start Date End Date Taking? Authorizing Provider  alfuzosin (UROXATRAL) 10 MG 24 hr tablet Take 1 tablet by mouth daily.    [provider]  ALPRAZolam Prudy Feeler) 0.5 MG tablet Take 0.5 mg by mouth 2 (two) times daily as needed for anxiety or sleep.  05/10/14   [provider]  apixaban (ELIQUIS) 5 MG TABS tablet Take 1 tablet (5 mg total) by mouth 2 (two) times daily. 02/05/18   Georgie Chard D, NP  aspirin EC 81 MG tablet Take 81 mg by mouth daily.    [provider]  bethanechol (URECHOLINE) 50 MG tablet Take 50 mg by mouth 3 (three) times daily.    [provider]  carvedilol (COREG) 6.25 MG tablet Take 1 tablet by mouth 2 (two) times daily. 09/01/16   [provider]  clopidogrel (PLAVIX) 75 MG tablet Take 1 tablet (75 mg total) by mouth daily. 02/02/18   Rai, Delene Ruffini, MD  colchicine 0.6 MG tablet Take 1 tablet (0.6 mg total) by mouth 2 (two) times daily. 02/05/18   Georgie Chard D, NP  docusate sodium  (COLACE) 100 MG capsule Take 100 mg by mouth daily.    [provider]  dorzolamide-timolol (COSOPT) 22.3-6.8 MG/ML ophthalmic solution Place 1 drop into both eyes 2 (two) times daily. 01/29/18   [provider]  ezetimibe (ZETIA) 10 MG tablet Take 1 tablet (10 mg total) by mouth daily. 02/06/18   Filbert Schilder, NP  finasteride (PROSCAR) 5 MG tablet Take 1 tablet (5 mg total) by mouth daily. 02/02/18   Rai, Delene Ruffini, MD  folic acid (FOLVITE) 1 MG tablet Take 1 tablet (1 mg total) by mouth daily. Patient not taking: Reported on 01/31/2018 08/24/17   Jackie Plum, MD  furosemide (LASIX) 20 MG tablet Take 20 mg by mouth daily as needed for fluid.  07/30/17   [provider]  hydrocortisone 1 % ointment Apply topically 2 (two) times daily. Apply to medial right malleolus in a thin layer twice daily 02/02/18   Rai, Delene Ruffini, MD  isosorbide mononitrate (IMDUR) 60 MG 24 hr tablet Take 1 tablet (60 mg total) by mouth daily.  02/02/18   Rai, Delene Ruffiniipudeep K, MD  linaclotide (LINZESS) 145 MCG CAPS capsule Take 145 mcg by mouth daily before breakfast.    [provider]  Melatonin 5 MG TABS Take 40 mg by mouth at bedtime.    [provider]  nitroGLYCERIN (NITROSTAT) 0.4 MG SL tablet Place 0.4 mg under the tongue every 5 (five) minutes as needed for chest pain.  03/10/16   [provider]  ondansetron (ZOFRAN) 8 MG tablet Take 8 mg by mouth 2 (two) times daily.     [provider]  pantoprazole (PROTONIX) 40 MG tablet Take 40 mg by mouth daily.     [provider]  ranolazine (RANEXA) 1000 MG SR tablet Take 1,000 mg by mouth 2 (two) times daily.  02/25/16   [provider]  TRAVATAN Z 0.004 % SOLN ophthalmic solution Place 1 drop into both eyes 2 (two) times daily.  06/24/17   [provider]   Physical Exam: Blood pressure (!) 109/47, pulse 72, temperature 97.7 F (36.5 C), temperature source Oral, resp. rate 20, SpO2 100  %. 1. General:  in No  Acute distress    Chronically ill pale -appearing 2. Psychological: Alert and   Oriented 3. Head/ENT:    Dry Mucous Membranes                          Head Non traumatic, neck supple                           Poor Dentition 4. SKIN:   decreased Skin turgor,  Skin clean Dry and intact no rash 5. Heart: Regular rate and rhythm no  Murmur, no Rub or gallop 6. Lungs: , no wheezes or crackles   7. Abdomen: Soft,  non-tender, Non distended  owel sounds present 8. Lower extremities: no clubbing, cyanosis, no edema 9. Neurologically Grossly intact, moving all 4 extremities equally   intact 10. MSK: Normal range of motion   LABS:     Recent Labs  Lab 02/19/18 1729  WBC 5.4  NEUTROABS 3.2  HGB 7.4*  HCT 22.9*  MCV 100.9*  PLT 218   Basic Metabolic Panel: Recent Labs  Lab 02/19/18 1729  NA 138  K 4.1  CL 107  CO2 20*  GLUCOSE 122*  BUN 25*  CREATININE 1.37*  CALCIUM 8.7*      Recent Labs  Lab 02/19/18 1729  AST 22  ALT 24  ALKPHOS 40  BILITOT 0.5  PROT 5.8*  ALBUMIN 3.1*   No results for input(s): LIPASE, AMYLASE in the last 168 hours. No results for input(s): AMMONIA in the last 168 hours.    HbA1C: No results for input(s): HGBA1C in the last 72 hours. CBG: No results for input(s): GLUCAP in the last 168 hours.    Urine analysis: No results found for: COLORURINE, APPEARANCEUR, LABSPEC, PHURINE, GLUCOSEU, HGBUR, BILIRUBINUR, KETONESUR, PROTEINUR, UROBILINOGEN, NITRITE, LEUKOCYTESUR     Cultures: No results found for: SDES, SPECREQUEST, CULT, REPTSTATUS   Radiological Exams on Admission: Dg Chest 2 View  Result Date: 02/19/2018 CLINICAL DATA:  78 year old male with chest pain, nausea and vomiting. EXAM: CHEST - 2 VIEW [facility] and earlier. FINDINGS: Semi upright AP and lateral views. Stable sequelae of CABG. Mildly lower lung volumes compared to prior two view exams. Mediastinal  contours remain within normal limits. Visualized tracheal air column  is within normal limits. No pneumothorax or pulmonary edema. Small chronic calcified granuloma in the right lung near the minor fissure. No pleural effusion or confluent pulmonary opacity. No acute osseous abnormality identified. Negative visible bowel gas pattern. IMPRESSION: No acute cardiopulmonary abnormality. Electronically Signed   By: Odessa Fleming M.D.   On: 02/19/2018 18:06    Chart has been reviewed    Assessment/Plan   78 y.o. male with medical history significant of CAD, sp of CABG recent pericarditis, HLD, HTN, chronic diastolic CHF,  alcohol abuse, PUD, chronic foley catheter, chronic anemia recently new diagnosis of A. Fib  On eliquis     Admitted for  symptomatic anemia with likely upper GI bleed in the setting of triple therapy and recent unstable angina status post DES stenting complicated by pericarditis   Present on Admission: . Upper GI bleed -  - Glasgow Blatchford score BUN >18.2  , Hg <13  melena   CHF   >1 Justifies admission and aggressive management      Modifying risk factors include: hx of PUD   anticoagulation,  alcohol abuse     Admit to stepdown given significant hemoglobin drop history of CAD with recent DES stents on triple therapy with recurrent chest pain with triple therapy had to be discontinued    -    gastroenterology (   [facility]) they will see patient in a.m. appreciate their consult   - serial CBC.    - Monitor for any recurrence,  evidence of hemodynamic instability or significant blood loss We will transfuse 2 units and follow after this Transfuse as needed for hemoglobin below 7 or evidence of life-threatening bleeding  - Establish at least 2 PIV and fluid resuscitate   - clear liquids for tonight keep nothing by mouth post midnight,   -  administer Protonix drip      . BPH (benign prostatic hyperplasia) stable  . Gastroesophageal reflux disease without esophagitis on Protonix .  Hyperlipidemia -stable continue home medications . Hypertension -allow some permissive hypertension for tonight given intermittent chest pain continue with Coreg and Imdur . CAD (coronary artery disease) -hold aspirin and Plavix continue statin continue Coreg . Alcohol abuse -monitor for any signs of withdrawal patient denies history of withdrawals . Chest pain at rest -recent admission for pericarditis currently symptomatic anemia.  Known history of CAD.  Appreciate cardiology consult continue to cycle cardiac enzymes. Not a candidate for anticoagulation given GI bleed . Chronic diastolic CHF (congestive heart failure) (HCC) monitor avoid fluid overload currently appears to be euvolemic . Pericarditis avoid NSAIDs and steroids given ongoing GI bleed.  Appreciate cardiology input . Symptomatic anemia transfuse and follow . AKI (acute kidney injury) (HCC) gently rehydrate and follow fluid status   Other plan as per orders.  DVT prophylaxis:  SCD    Code Status:   DNR/DNI   as per patient  I had personally discussed CODE STATUS with patient    Family Communication:   Family not  at  Bedside    Disposition Plan:     To home once workup is complete and patient is stable                      Would benefit from PT/OT eval prior to DC                      Consults called:  [facility] ,Cardiology   Admission status:  inpatient  Expect 2 midnight stay secondary to severity of patient's current illness including     Severe lab/radiological/exam abnormalities including: Symptomatic anemia   and extensive comorbidities including:  CHF  CAD . Chronic anticoagulation  That are currently affecting medical management.   I expect  patient to be hospitalized for 2 midnights requiring inpatient medical care.  Patient is at high risk for adverse outcome (such as loss of life or disability) if not treated.  Indication for inpatient stay as follows:  Severe change from baseline regarding mental  status    persistent chest pain despite medical management Need for operative/procedural  Intervention EGD    Need for  IV fluids,  IV   medications      Level of care    SDU tele indefinitely please discontinue once patient no longer qualifies     Dshawn Mcnay 02/19/2018, 8:27 PM    [facility]     after 2 AM please page floor coverage PA If 7AM-7PM, please contact the day team taking care of the patient using [email or web address]

## 2018-02-19 NOTE — ED Notes (Signed)
Pt is blood bank bracelet,  I will check with nurse to make sure blood was sent.

## 2018-02-20 ENCOUNTER — Inpatient Hospital Stay (HOSPITAL_COMMUNITY): Payer: Medicare HMO | Admitting: Certified Registered Nurse Anesthetist

## 2018-02-20 ENCOUNTER — Encounter (HOSPITAL_COMMUNITY): Payer: Self-pay | Admitting: *Deleted

## 2018-02-20 ENCOUNTER — Other Ambulatory Visit: Payer: Self-pay

## 2018-02-20 ENCOUNTER — Encounter (HOSPITAL_COMMUNITY)
Admission: EM | Disposition: A | Discharge status: Home Health | Payer: Self-pay | Source: Home / Self Care | Attending: Internal Medicine

## 2018-02-20 DIAGNOSIS — D62 Acute posthemorrhagic anemia: Secondary | ICD-10-CM

## 2018-02-20 DIAGNOSIS — K921 Melena: Secondary | ICD-10-CM

## 2018-02-20 HISTORY — PX: ESOPHAGOGASTRODUODENOSCOPY (EGD) WITH PROPOFOL: SHX5813

## 2018-02-20 LAB — HEMOGLOBIN A1C
Hgb A1c MFr Bld: 5.8 % — ABNORMAL HIGH (ref 4.8–5.6)
Mean Plasma Glucose: 119.76 mg/dL

## 2018-02-20 LAB — COMPREHENSIVE METABOLIC PANEL
ALT: 24 U/L (ref 0–44)
ANION GAP: 7 (ref 5–15)
AST: 22 U/L (ref 15–41)
Albumin: 2.9 g/dL — ABNORMAL LOW (ref 3.5–5.0)
Alkaline Phosphatase: 37 U/L — ABNORMAL LOW (ref 38–126)
BUN: 24 mg/dL — ABNORMAL HIGH (ref 8–23)
CO2: 21 mmol/L — ABNORMAL LOW (ref 22–32)
Calcium: 8.1 mg/dL — ABNORMAL LOW (ref 8.9–10.3)
Chloride: 110 mmol/L (ref 98–111)
Creatinine, Ser: 1.25 mg/dL — ABNORMAL HIGH (ref 0.61–1.24)
GFR calc Af Amer: >60 mL/min (ref >60)
GFR calc non Af Amer: 54 mL/min — ABNORMAL LOW (ref >60)
GLUCOSE: 90 mg/dL (ref 70–99)
Potassium: 3.7 mmol/L (ref 3.5–5.1)
Sodium: 138 mmol/L (ref 135–145)
Total Bilirubin: 0.9 mg/dL (ref 0.3–1.2)
Total Protein: 5.2 g/dL — ABNORMAL LOW (ref 6.5–8.1)

## 2018-02-20 LAB — CBC
HCT: 27.4 % — ABNORMAL LOW (ref 39.0–52.0)
Hemoglobin: 9.1 g/dL — ABNORMAL LOW (ref 13.0–17.0)
MCH: 31.4 pg (ref 26.0–34.0)
MCHC: 33.2 g/dL (ref 30.0–36.0)
MCV: 94.5 fL (ref 80.0–100.0)
Platelets: 190 10*3/uL (ref 150–400)
RBC: 2.9 MIL/uL — AB (ref 4.22–5.81)
RDW: 15.9 % — ABNORMAL HIGH (ref 11.5–15.5)
WBC: 5.1 10*3/uL (ref 4.0–10.5)
nRBC: 0 % (ref 0.0–0.2)

## 2018-02-20 LAB — TROPONIN I
Troponin I: <0.03 ng/mL (ref <0.03)
Troponin I: <0.03 ng/mL (ref <0.03)

## 2018-02-20 LAB — TSH: TSH: 1.058 u[IU]/mL (ref 0.350–4.500)

## 2018-02-20 LAB — MAGNESIUM: Magnesium: 1.9 mg/dL (ref 1.7–2.4)

## 2018-02-20 LAB — PHOSPHORUS: Phosphorus: 3.4 mg/dL (ref 2.5–4.6)

## 2018-02-20 LAB — MRSA PCR SCREENING: MRSA by PCR: NEGATIVE

## 2018-02-20 SURGERY — ESOPHAGOGASTRODUODENOSCOPY (EGD) WITH PROPOFOL
Anesthesia: Monitor Anesthesia Care

## 2018-02-20 MED ORDER — ALFUZOSIN HCL ER 10 MG PO TB24
10.0000 mg | ORAL_TABLET | Freq: Every day | ORAL | Status: DC
  Administered 2018-02-20 – 2018-02-22 (×3): 10 mg via ORAL
  Filled 2018-02-20 (×3): qty 1

## 2018-02-20 MED ORDER — PROPOFOL 10 MG/ML IV BOLUS
INTRAVENOUS | Status: DC | PRN
  Administered 2018-02-20: 20 mg via INTRAVENOUS

## 2018-02-20 MED ORDER — PANTOPRAZOLE SODIUM 40 MG IV SOLR: 40.0000 mg | Freq: Two times a day (BID) | INTRAVENOUS | Status: DC

## 2018-02-20 MED ORDER — FINASTERIDE 5 MG PO TABS
5.0000 mg | ORAL_TABLET | Freq: Every day | ORAL | Status: DC
  Administered 2018-02-20 – 2018-02-22 (×3): 5 mg via ORAL
  Filled 2018-02-20 (×3): qty 1

## 2018-02-20 MED ORDER — PANTOPRAZOLE SODIUM 40 MG PO TBEC
40.0000 mg | DELAYED_RELEASE_TABLET | Freq: Every day | ORAL | Status: DC
  Administered 2018-02-21 – 2018-02-22 (×2): 40 mg via ORAL
  Filled 2018-02-20 (×2): qty 1

## 2018-02-20 MED ORDER — SODIUM CHLORIDE 0.9 % IV SOLN
8.0000 mg/h | INTRAVENOUS | Status: DC
  Administered 2018-02-20 (×2): 8 mg/h via INTRAVENOUS
  Filled 2018-02-20 (×2): qty 80

## 2018-02-20 MED ORDER — SODIUM CHLORIDE 0.9 % IV SOLN
INTRAVENOUS | Status: DC
  Administered 2018-02-20: 12:00:00 via INTRAVENOUS

## 2018-02-20 MED ORDER — PEG 3350-KCL-NABCB-NACL-NASULF 236 G PO SOLR
4000.0000 mL | Freq: Once | ORAL | Status: AC
  Administered 2018-02-20: 4000 mL via ORAL
  Filled 2018-02-20: qty 4000

## 2018-02-20 MED ORDER — SODIUM CHLORIDE 0.9 % IV SOLN
80.0000 mg | Freq: Once | INTRAVENOUS | Status: AC
  Administered 2018-02-20: 80 mg via INTRAVENOUS
  Filled 2018-02-20: qty 80

## 2018-02-20 MED ORDER — CLOPIDOGREL BISULFATE 75 MG PO TABS
75.0000 mg | ORAL_TABLET | Freq: Every day | ORAL | Status: DC
  Administered 2018-02-20 – 2018-02-21 (×2): 75 mg via ORAL
  Filled 2018-02-20 (×2): qty 1

## 2018-02-20 MED ORDER — PROPOFOL 500 MG/50ML IV EMUL
INTRAVENOUS | Status: DC | PRN
  Administered 2018-02-20: 100 ug/kg/min via INTRAVENOUS

## 2018-02-20 SURGICAL SUPPLY — 15 items

## 2018-02-20 NOTE — Transfer of Care (Signed)
Immediate Anesthesia Transfer of Care Note  Patient: Austin Malone  Procedure(s) Performed: ESOPHAGOGASTRODUODENOSCOPY (EGD) WITH PROPOFOL (N/A )  Patient Location: Endoscopy Unit  Anesthesia Type:MAC  Level of Consciousness: awake, alert  and oriented  Airway & Oxygen Therapy: Patient Spontanous Breathing and Patient connected to nasal cannula oxygen  Post-op Assessment: Report given to RN and Post -op Vital signs reviewed and stable  Post vital signs: Reviewed and stable  Last Vitals:  Vitals Value Taken Time  BP    Temp    Pulse    Resp    SpO2      Last Pain:  Vitals:   02/20/18 1122  TempSrc: Oral  PainSc: 0-No pain         Complications: No apparent anesthesia complications

## 2018-02-20 NOTE — Interval H&P Note (Signed)
History and Physical Interval Note:  02/20/2018 11:42 AM  Austin Malone  has presented today for surgery, with the diagnosis of melena and anemia  The various methods of treatment have been discussed with the patient and family. After consideration of risks, benefits and other options for treatment, the patient has consented to  Procedure(s): ESOPHAGOGASTRODUODENOSCOPY (EGD) WITH PROPOFOL (N/A) as a surgical intervention .  The patient's history has been reviewed, patient examined, no change in status, stable for surgery.  I have reviewed the patient's chart and labs.  Questions were answered to the patient's satisfaction.     Charlie PitterHenry L Danis III

## 2018-02-20 NOTE — Consult Note (Addendum)
[facility] Consult Note   History Austin Malone MRN # [id number]  Date of Admission: 02/19/2018 Date of Consultation: 02/20/2018 Referring physician: Dr. Ghimire, Shanker M, MD Primary Care Provider: Haque, Imran P, MD Primary Gastroenterologist: None   Reason for Consultation/Chief Complaint: Melena  Subjective  HPI:  This is a 78-year-old man admitted last evening for GI bleeding and chest pain.  He has a complicated recent cardiac history extensively outlined in his admission history and physical and cardiology consultation, both of which I reviewed.  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  He had been on aspirin prior to that, Plavix was then added after stent placement.  Shortly after that, the patient was admitted to [facility] and transferred to [facility] with chest pain found to be from pericarditis.  This was apparently treated with colchicine, patient denies having taken any NSAIDs recently but he seems uncertain about that.  During the pericarditis he developed new onset atrial fibrillation which apparently converted to sinus rhythm, and he was put on Eliquis.  His last Eliquis dose was probably about 1 PM yesterday by his report. He was seen in cardiology consult last night and his intermittent 2 out of 10 chest pain is from pericarditis.  He does not have coronary syndrome at present. He was found to have a hemoglobin that it dropped about 3 g from the most recent value, and he describes several days of black tarry stool that had apparently been going on since just after his recent discharge.  He has a possible previous history of ulcer, says there was a time when he was taking Excedrin many years ago. He received 2 units of PRBCs overnight.  At the moment he denies any chest pain, abdominal pain or dyspnea.  He denies hematemesis,  dysphagia, odynophagia, vomiting, or recent anorexia or weight loss.  ROS:  All other systems are negative except as noted above in the HPI  Past Medical History Past Medical History:  Diagnosis Date  . Anxiety   . Arthritis   . BPH (benign prostatic hyperplasia)   . CAD (coronary artery disease)   . Cataracts, bilateral   . Enlarged prostate   . GERD (gastroesophageal reflux disease)   . Glaucoma   . Hyperlipidemia   . Hypertension   . Non-ST elevation (NSTEMI) myocardial infarction (HCC) 08/21/2017  . Pneumonia   . PUD (peptic ulcer disease)   . Rotator cuff tear     Past Surgical History Past Surgical History:  Procedure Laterality Date  . APPENDECTOMY    . CORONARY ANGIOPLASTY WITH STENT PLACEMENT  11/2014   PCI of SVG to PDA x 2 with Xience DES to proximal stenosis, Ultra 5.0 BMS to distal thrombotic lesion  . CORONARY ARTERY BYPASS GRAFT  2008  . CORONARY ATHERECTOMY N/A 02/01/2018   Procedure: CORONARY ATHERECTOMY;  Surgeon: End, Christopher, MD;  Location: [facility];  Service: Cardiovascular;  Laterality: N/A;  . CORONARY STENT INTERVENTION N/A 02/01/2018   Procedure: CORONARY STENT INTERVENTION;  Surgeon: End, Christopher, MD;  Location: [facility];  Service: Cardiovascular;  Laterality: N/A;  . EYE SURGERY    . INTRAVASCULAR ULTRASOUND/IVUS N/A 02/01/2018   Procedure: Intravascular Ultrasound/IVUS;  Surgeon: End, Christopher, MD;  Location: [facility];  Service: Cardiovascular;  Laterality: N/A;  . LEFT HEART CATH AND CORS/GRAFTS ANGIOGRAPHY N/A 08/21/2017   Procedure: LEFT HEART   CATH AND CORS/GRAFTS ANGIOGRAPHY;  Surgeon: Yvonne Kendall, MD;  Location: [facility];  Service: Cardiovascular;  Laterality: N/A;  . LEFT HEART CATH AND CORS/GRAFTS ANGIOGRAPHY N/A 02/01/2018   Procedure: LEFT HEART CATH AND CORS/GRAFTS ANGIOGRAPHY;  Surgeon: Yvonne Kendall, MD;  Location: [facility];  Service: Cardiovascular;  Laterality: N/A;  . LEG  SURGERY    . TONSILLECTOMY      Family History Family History  Problem Relation Age of Onset  . Diabetes Mother   . Heart attack Brother     Social History Social History   Socioeconomic History  . Marital status: Married    Spouse name: Not on file  . Number of children: Not on file  . Years of education: Not on file  . Highest education level: Not on file  Occupational History  . Occupation: Retired  Engineer, production  . Financial resource strain: Not on file  . Food insecurity:    Worry: Not on file    Inability: Not on file  . Transportation needs:    Medical: Not on file    Non-medical: Not on file  Tobacco Use  . Smoking status: Former Games developer  . Smokeless tobacco: Never Used  Substance and Sexual Activity  . Alcohol use: Yes    Alcohol/week: 21.0 standard drinks    Types: 21 Cans of beer per week  . Drug use: Yes    Types: Marijuana  . Sexual activity: Not on file  Lifestyle  . Physical activity:    Days per week: Not on file    Minutes per session: Not on file  . Stress: Not on file  Relationships  . Social connections:    Talks on phone: Not on file    Gets together: Not on file    Attends religious service: Not on file    Active member of club or organization: Not on file    Attends meetings of clubs or organizations: Not on file    Relationship status: Not on file  Other Topics Concern  . Not on file  Social History Narrative   He lives in [place], [place] alone.    Allergies Allergies  Allergen Reactions  . Codeine Other (See Comments)    unknown  . Lisinopril Cough  . Contrast Media [Iodinated Diagnostic Agents] Hives and Rash    Outpatient Meds Home medications from the H+P and/or nursing med reconciliation reviewed.  Inpatient med list reviewed  _____________________________________________________________________ Objective   Exam:  Current vital signs  Patient Vitals for the past 8 hrs:  BP Temp Temp src Pulse Resp  SpO2  02/20/18 0515 131/74 97.9 F (36.6 C) Oral 66 17 97 %  02/20/18 0221 129/67 98.1 F (36.7 C) Oral 67 20 96 %  02/20/18 0148 (!) 110/49 98.3 F (36.8 C) Oral 69 16 98 %    Intake/Output Summary (Last 24 hours) at 02/20/2018 0715 Last data filed at 02/20/2018 0500 Gross per 24 hour  Intake 759.38 ml  Output 650 ml  Net 109.38 ml    Physical Exam:    General: this is an early male patient in no acute distress.  He is alert and conversational  Eyes: sclera anicteric, no redness  ENT: oral mucosa moist without lesions, no cervical or supraclavicular lymphadenopathy  CV: RRR without murmur or rub, S1/S2, no JVD,, no peripheral edema.  Sternotomy scar  Resp: clear to auscultation bilaterally, normal RR and effort noted  GI: soft, no tenderness, with active bowel  sounds. No guarding or palpable organomegaly noted  Skin; warm and dry, no rash or jaundice noted.  Pale  Neuro: awake, alert and oriented x 3. Normal gross motor function and fluent speech.  Labs:  Recent Labs  Lab 02/19/18 1729  WBC 5.4  HGB 7.4*  HCT 22.9*  PLT 218   CBC Latest Ref Rng & Units 02/19/2018 02/06/2018 02/05/2018  WBC 4.0 - 10.5 K/uL 5.4 6.8 7.1  Hemoglobin 13.0 - 17.0 g/dL 7.4(L) 10.5(L) 10.3(L)  Hematocrit 39.0 - 52.0 % 22.9(L) 31.7(L) 31.0(L)  Platelets 150 - 400 K/uL 218 215 193    Recent Labs  Lab 02/19/18 1729  NA 138  K 4.1  CL 107  CO2 20*  BUN 25*  ALBUMIN 3.1*  ALKPHOS 40  ALT 24  AST 22  GLUCOSE 122*  Creatinine 1.37 last evening, up from 1.19 on 02/05/2018.  GFR 50  Troponin negative x2 last evening  Recent Labs  Lab 02/19/18 2225  INR 1.60    Radiologic studies: Twelve-lead EKG report on file.  Reviewed in cardiology consult note.  None acute adult on two-view portable chest report on file from yesterday.  @[email or web address]@ Impression:  Melena Acute blood loss anemia Recent unstable angina with DES to RCA Recent onset atrial fibrillation, patient  was on triple therapy with aspirin Plavix and Eliquis, last doses of all of them yesterday. Coronary artery disease History of alcohol abuse  This appears most likely to be upper GI bleeding, suspected peptic ulcer disease from chronic low-dose aspirin use, now exacerbated by additional antiplatelet agent and oral anticoagulant.  Plan:  He has ongoing GI bleeding causing severely decreased hemoglobin in the setting of multiple antiplatelet and anticoagulant medicines.  His blood pressure and heart rate are currently stable and he does not have hematemesis.  Mild consumptive coagulopathy last evening. He has adequate peripheral IV access is on a Protonix drip, received 2 units of PRBCs, and repeat hemoglobin was drawn a little while ago.  He needs an upper endoscopy today.  The procedure was described in detail and he is agreeable after discussion of risks and benefits.  The benefits and risks of the planned procedure were described in detail with the patient or (when appropriate) their health care proxy.  Risks were outlined as including, but not limited to, bleeding, infection, perforation, adverse medication reaction leading to cardiac or pulmonary decompensation, or pancreatitis (if ERCP).  The limitation of incomplete mucosal visualization was also discussed.  No guarantees or warranties were given.  Patient at increased risk for cardiopulmonary complications of procedure due to medical comorbidities. He also understands that his DNR status must be reversed for 24 hours to allow endoscopic procedure.  It may be difficult to intervene on any lesion discovered with the medicines he has on board.  However, at least the Eliquis should be about out of his system even if there is still antiplatelet effect from the Plavix.  He understands the increased risks of the procedure and is willing to proceed. He will require anesthesia support due to his medical comorbidities and history of heavy alcohol  use.  Thank you for the courtesy of this consult.  Please contact me with any questions or concerns.  Shirley Bolle L Danis III Office: [phone]  

## 2018-02-20 NOTE — Progress Notes (Addendum)
Pt arrived to 5 west, alert and oriented x 4. Identified appropriately, cardiac monitor in place, pt in no acute distress. Pt oriented to room and equipment, instructed on how to use call bell, understanding verbalized and call bell left within reach.  Pt has chronic Foley that was caped. Per pt last time Foley catheter replaced November 2019. Pt advised that is time to replace his Foley, he refused, will consult with MD in the morning.  Pt money taken to security and yellow slip placed in pt chart.  Will continue to monitor and treat pt per MD orders.

## 2018-02-20 NOTE — Progress Notes (Signed)
PROGRESS NOTE        PATIENT DETAILS Name: Austin Malone Age: 78 y.o. Sex: male Date of Birth: Jul 11, 1940 Admit Date: 02/19/2018 Admitting Physician Therisa Doyne, MD [id number], Myrene Galas, MD  Brief Narrative: Patient is a 78 y.o. male with history of PAF on Eliquis, CAD s/p CABG in the past-who underwent PCI to to [facility] with orbital atherectomy and DES and PCI to [facility] and rPL with DES on 1/20 and was started on aspirin/Plavix-presented to the hospital with melena, and acute blood loss anemia.  See below for further details.  Subjective: No further melanotic stools-last melena was yesterday morning.  No chest pain or shortness of breath.  Assessment/Plan: Upper GI bleeding with acute blood loss anemia: Melena seems to have resolved, hemoglobin stable after 2 units of PRBC transfusion.  Aspirin/Plavix/Eliquis has been discontinued for now.  Continue PPI-GI following with plans for endoscopic evaluation later today.    CAD with recent PCI to [facility] with orbital atherectomy and DES and PCI to dRCA and rPL with DES on 1/20: Thankfully no anginal symptoms this morning-very difficult situation-due to ongoing GI bleeding-all antiplatelets has been held.  Await endoscopic evaluation-and determine if antiplatelet agents can be restarted.  Cardiology following.  AKI: Mild AKI likely hemodynamically mediated-follow electrolytes-continue supportive care.  PAF: Sinus rhythm on telemetry-Eliquis on hold.  Continue rate control with Coreg.  Pericarditis: This occurred post PCI on 1/20-patient denies any NSAID use to me, continue colchicine.  No chest pain this morning.  Chronic diastolic heart failure: Euvolemic on exam-follow volume status closely.  Hypertension: Blood pressure stable-continue Imdur and Coreg.  BPH: Continue finasteride and alfuzosin  DVT Prophylaxis: SCD's  Code Status: DNR  Family Communication: None at bedside  Disposition Plan: Remain  inpatient  Antimicrobial agents: Anti-infectives (From admission, onward)   None      Procedures: None  CONSULTS:  cardiology and GI  Time spent: 25- minutes-Greater than 50% of this time was spent in counseling, explanation of diagnosis, planning of further management, and coordination of care.  MEDICATIONS: Scheduled Meds: . [MAR Hold] carvedilol  6.25 mg Oral BID  . [MAR Hold] colchicine  0.6 mg Oral Daily  . [MAR Hold] dorzolamide-timolol  1 drop Both Eyes BID  . [MAR Hold] isosorbide mononitrate  60 mg Oral Daily  . [MAR Hold] latanoprost  1 drop Both Eyes QHS  . [MAR Hold] pantoprazole  40 mg Intravenous Q12H  . [MAR Hold] ranolazine  1,000 mg Oral BID   Continuous Infusions: . sodium chloride    . pantoprozole (PROTONIX) infusion 8 mg/hr (02/20/18 1047)   PRN Meds:.[MAR Hold] acetaminophen **OR** [MAR Hold] acetaminophen, [MAR Hold] ALPRAZolam, [MAR Hold] HYDROcodone-acetaminophen, [MAR Hold]  morphine injection, [MAR Hold] nitroGLYCERIN, [MAR Hold] ondansetron **OR** [MAR Hold] ondansetron (ZOFRAN) IV   PHYSICAL EXAM: Vital signs: Vitals:   02/20/18 0800 02/20/18 0900 02/20/18 1050 02/20/18 1122  BP: (!) 148/72  (!) 148/70 (!) 164/86  Pulse: 66 66 68 68  Resp: 17 17 15 14   Temp: 98.4 F (36.9 C)   98.8 F (37.1 C)  TempSrc: Oral   Oral  SpO2: 98% 97% 96% 98%  Weight:    83.5 kg  Height:    5\' 10"  (1.778 m)   Filed Weights   02/20/18 1122  Weight: 83.5 kg   Body mass index is 26.41 kg/m.   General  appearance :Awake, alert, not in any distress.  HEENT: Atraumatic and Normocephalic Neck: supple Resp:Good air entry bilaterally, no added sounds  CVS: S1 S2 regular GI: Bowel sounds present, Non tender and not distended with no gaurding, rigidity or rebound.No organomegaly Extremities: B/L Lower Ext shows no edema, both legs are warm to touch Neurology:  speech clear,Non focal, sensation is grossly intact. Psychiatric: Normal judgment and insight.  Alert and oriented x 3. Normal mood. Musculoskeletal:No digital cyanosis Skin:No Rash, warm and dry Wounds:N/A  I have personally reviewed following labs and imaging studies  LABORATORY DATA: CBC: Recent Labs  Lab 02/19/18 1729 02/20/18 0649  WBC 5.4 5.1  NEUTROABS 3.2  --   HGB 7.4* 9.1*  HCT 22.9* 27.4*  MCV 100.9* 94.5  PLT 218 190    Basic Metabolic Panel: Recent Labs  Lab 02/19/18 1729 02/20/18 0649  NA 138 138  K 4.1 3.7  CL 107 110  CO2 20* 21*  GLUCOSE 122* 90  BUN 25* 24*  CREATININE 1.37* 1.25*  CALCIUM 8.7* 8.1*  MG  --  1.9  PHOS  --  3.4    GFR: Estimated Creatinine Clearance: 49.5 mL/min (A) (by C-G formula based on SCr of 1.25 mg/dL (H)).  Liver Function Tests: Recent Labs  Lab 02/19/18 1729 02/20/18 0649  AST 22 22  ALT 24 24  ALKPHOS 40 37*  BILITOT 0.5 0.9  PROT 5.8* 5.2*  ALBUMIN 3.1* 2.9*   No results for input(s): LIPASE, AMYLASE in the last 168 hours. No results for input(s): AMMONIA in the last 168 hours.  Coagulation Profile: Recent Labs  Lab 02/19/18 2225  INR 1.60    Cardiac Enzymes: Recent Labs  Lab 02/19/18 1729 02/19/18 2225 02/20/18 0649  TROPONINI <0.03 <0.03 <0.03    BNP (last 3 results) No results for input(s): PROBNP in the last 8760 hours.  HbA1C: Recent Labs    02/20/18 0649  HGBA1C 5.8*    CBG: No results for input(s): GLUCAP in the last 168 hours.  Lipid Profile: No results for input(s): CHOL, HDL, LDLCALC, TRIG, CHOLHDL, LDLDIRECT in the last 72 hours.  Thyroid Function Tests: Recent Labs    02/20/18 0649  TSH 1.058    Anemia Panel: No results for input(s): VITAMINB12, FOLATE, FERRITIN, TIBC, IRON, RETICCTPCT in the last 72 hours.  Urine analysis: No results found for: COLORURINE, APPEARANCEUR, LABSPEC, PHURINE, GLUCOSEU, HGBUR, BILIRUBINUR, KETONESUR, PROTEINUR, UROBILINOGEN, NITRITE, LEUKOCYTESUR  Sepsis Labs: Lactic Acid, Venous No results found for:  LATICACIDVEN  MICROBIOLOGY: Recent Results (from the past 240 hour(s))  MRSA PCR Screening     Status: None   Collection Time: 02/20/18  3:44 AM  Result Value Ref Range Status   MRSA by PCR NEGATIVE NEGATIVE Final    Comment:        The GeneXpert MRSA Assay (FDA approved for NASAL specimens only), is one component of a comprehensive MRSA colonization surveillance program. It is not intended to diagnose MRSA infection nor to guide or monitor treatment for MRSA infections. Performed at [facility], [address] [place], [place] [address]     RADIOLOGY STUDIES/RESULTS: Dg Chest 2 View  Result Date: 02/19/2018 CLINICAL DATA:  78 year old male with chest pain, nausea and vomiting. EXAM: CHEST - 2 VIEW COMPARISON:  [facility] portable chest x-ray 02/08/2018 and earlier. FINDINGS: Semi upright AP and lateral views. Stable sequelae of CABG. Mildly lower lung volumes compared to prior two view exams. Mediastinal contours remain within normal limits. Visualized tracheal  air column is within normal limits. No pneumothorax or pulmonary edema. Small chronic calcified granuloma in the right lung near the minor fissure. No pleural effusion or confluent pulmonary opacity. No acute osseous abnormality identified. Negative visible bowel gas pattern. IMPRESSION: No acute cardiopulmonary abnormality. Electronically Signed   By: H  Hall M.D.   On: 02/19/2018 18:06     LOS: 1 day   Jeoffrey MassedShaOdessa Flemingnker Javel Hersh, MD  [facility]  If 7PM-7AM, please contact night-coverage  Please page via [email or web address]  Go to [email or web address] and use [facility] universal password to access. If you do not have the password, please contact the hospital operator.  Locate the [facility] provider you are looking for under [facility] and page to a number that you can be directly reached. If you still have difficulty reaching the provider, please page the [facility] (Director on Call) for the Hospitalists listed on amion for  assistance.  02/20/2018, 11:32 AM

## 2018-02-20 NOTE — Anesthesia Postprocedure Evaluation (Signed)
Anesthesia Post Note  Patient: Austin Malone  Procedure(s) Performed: ESOPHAGOGASTRODUODENOSCOPY (EGD) WITH PROPOFOL (N/A )     Patient location during evaluation: PACU Anesthesia Type: MAC Level of consciousness: awake and alert Pain management: pain level controlled Vital Signs Assessment: post-procedure vital signs reviewed and stable Respiratory status: spontaneous breathing, nonlabored ventilation, respiratory function stable and patient connected to nasal cannula oxygen Cardiovascular status: stable and blood pressure returned to baseline Postop Assessment: no apparent nausea or vomiting Anesthetic complications: no    Last Vitals:  Vitals:   02/20/18 1241 02/20/18 1314  BP: (!) 180/86 (!) 151/69  Pulse: 69 67  Resp: 18 (!) 22  Temp:  36.9 C  SpO2: 97% 99%    Last Pain:  Vitals:   02/20/18 1314  TempSrc: Oral  PainSc: 0-No pain                 Jonica Bickhart DAVID

## 2018-02-20 NOTE — H&P (View-Only) (Signed)
[facility] Gastroenterology Consult Note   History Austin Malone MRN # [id number]  Date of Admission: 02/19/2018 Date of Consultation: 02/20/2018 Referring physician: Dr. Jerral Ralph, Werner Lean, MD Primary Care Provider: Shelbie Ammons, MD Primary Gastroenterologist: None   Reason for Consultation/Chief Complaint: Melena  Subjective  HPI:  This is a 78 year old man admitted last evening for GI bleeding and chest pain.  He has a complicated recent cardiac history extensively outlined in his admission history and physical and cardiology consultation, both of which I reviewed.  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  He had been on aspirin prior to that, Plavix was then added after stent placement.  Shortly after that, the patient was admitted to [facility] and transferred to [facility] with chest pain found to be from pericarditis.  This was apparently treated with colchicine, patient denies having taken any NSAIDs recently but he seems uncertain about that.  During the pericarditis he developed new onset atrial fibrillation which apparently converted to sinus rhythm, and he was put on Eliquis.  His last Eliquis dose was probably about 1 PM yesterday by his report. He was seen in cardiology consult last night and his intermittent 2 out of 10 chest pain is from pericarditis.  He does not have coronary syndrome at present. He was found to have a hemoglobin that it dropped about 3 g from the most recent value, and he describes several days of black tarry stool that had apparently been going on since just after his recent discharge.  He has a possible previous history of ulcer, says there was a time when he was taking Excedrin many years ago. He received 2 units of PRBCs overnight.  At the moment he denies any chest pain, abdominal pain or dyspnea.  He denies hematemesis,  dysphagia, odynophagia, vomiting, or recent anorexia or weight loss.  ROS:  All other systems are negative except as noted above in the HPI  Past Medical History Past Medical History:  Diagnosis Date  . Anxiety   . Arthritis   . BPH (benign prostatic hyperplasia)   . CAD (coronary artery disease)   . Cataracts, bilateral   . Enlarged prostate   . GERD (gastroesophageal reflux disease)   . Glaucoma   . Hyperlipidemia   . Hypertension   . Non-ST elevation (NSTEMI) myocardial infarction (HCC) 08/21/2017  . Pneumonia   . PUD (peptic ulcer disease)   . Rotator cuff tear     Past Surgical History Past Surgical History:  Procedure Laterality Date  . APPENDECTOMY    . CORONARY ANGIOPLASTY WITH STENT PLACEMENT  11/2014   PCI of SVG to PDA x 2 with Xience DES to proximal stenosis, Ultra 5.0 BMS to distal thrombotic lesion  . CORONARY ARTERY BYPASS GRAFT  2008  . CORONARY ATHERECTOMY N/A 02/01/2018   Procedure: CORONARY ATHERECTOMY;  Surgeon: Yvonne Kendall, MD;  Location: [facility] INVASIVE CV LAB;  Service: Cardiovascular;  Laterality: N/A;  . CORONARY STENT INTERVENTION N/A 02/01/2018   Procedure: CORONARY STENT INTERVENTION;  Surgeon: Yvonne Kendall, MD;  Location: [facility] INVASIVE CV LAB;  Service: Cardiovascular;  Laterality: N/A;  . EYE SURGERY    . INTRAVASCULAR ULTRASOUND/IVUS N/A 02/01/2018   Procedure: Intravascular Ultrasound/IVUS;  Surgeon: Yvonne Kendall, MD;  Location: [facility] INVASIVE CV LAB;  Service: Cardiovascular;  Laterality: N/A;  . LEFT HEART CATH AND CORS/GRAFTS ANGIOGRAPHY N/A 08/21/2017   Procedure: LEFT HEART  CATH AND CORS/GRAFTS ANGIOGRAPHY;  Surgeon: Yvonne Kendall, MD;  Location: [facility] INVASIVE CV LAB;  Service: Cardiovascular;  Laterality: N/A;  . LEFT HEART CATH AND CORS/GRAFTS ANGIOGRAPHY N/A 02/01/2018   Procedure: LEFT HEART CATH AND CORS/GRAFTS ANGIOGRAPHY;  Surgeon: Yvonne Kendall, MD;  Location: [facility] INVASIVE CV LAB;  Service: Cardiovascular;  Laterality: N/A;  . LEG  SURGERY    . TONSILLECTOMY      Family History Family History  Problem Relation Age of Onset  . Diabetes Mother   . Heart attack Brother     Social History Social History   Socioeconomic History  . Marital status: Married    Spouse name: Not on file  . Number of children: Not on file  . Years of education: Not on file  . Highest education level: Not on file  Occupational History  . Occupation: Retired  Engineer, production  . Financial resource strain: Not on file  . Food insecurity:    Worry: Not on file    Inability: Not on file  . Transportation needs:    Medical: Not on file    Non-medical: Not on file  Tobacco Use  . Smoking status: Former Games developer  . Smokeless tobacco: Never Used  Substance and Sexual Activity  . Alcohol use: Yes    Alcohol/week: 21.0 standard drinks    Types: 21 Cans of beer per week  . Drug use: Yes    Types: Marijuana  . Sexual activity: Not on file  Lifestyle  . Physical activity:    Days per week: Not on file    Minutes per session: Not on file  . Stress: Not on file  Relationships  . Social connections:    Talks on phone: Not on file    Gets together: Not on file    Attends religious service: Not on file    Active member of club or organization: Not on file    Attends meetings of clubs or organizations: Not on file    Relationship status: Not on file  Other Topics Concern  . Not on file  Social History Narrative   He lives in [place], [place] alone.    Allergies Allergies  Allergen Reactions  . Codeine Other (See Comments)    unknown  . Lisinopril Cough  . Contrast Media [Iodinated Diagnostic Agents] Hives and Rash    Outpatient Meds Home medications from the H+P and/or nursing med reconciliation reviewed.  Inpatient med list reviewed  _____________________________________________________________________ Objective   Exam:  Current vital signs  Patient Vitals for the past 8 hrs:  BP Temp Temp src Pulse Resp  SpO2  02/20/18 0515 131/74 97.9 F (36.6 C) Oral 66 17 97 %  02/20/18 0221 129/67 98.1 F (36.7 C) Oral 67 20 96 %  02/20/18 0148 (!) 110/49 98.3 F (36.8 C) Oral 69 16 98 %    Intake/Output Summary (Last 24 hours) at 02/20/2018 0715 Last data filed at 02/20/2018 0500 Gross per 24 hour  Intake 759.38 ml  Output 650 ml  Net 109.38 ml    Physical Exam:    General: this is an early male patient in no acute distress.  He is alert and conversational  Eyes: sclera anicteric, no redness  ENT: oral mucosa moist without lesions, no cervical or supraclavicular lymphadenopathy  CV: RRR without murmur or rub, S1/S2, no JVD,, no peripheral edema.  Sternotomy scar  Resp: clear to auscultation bilaterally, normal RR and effort noted  GI: soft, no tenderness, with active bowel  sounds. No guarding or palpable organomegaly noted  Skin; warm and dry, no rash or jaundice noted.  Pale  Neuro: awake, alert and oriented x 3. Normal gross motor function and fluent speech.  Labs:  Recent Labs  Lab 02/19/18 1729  WBC 5.4  HGB 7.4*  HCT 22.9*  PLT 218   CBC Latest Ref Rng & Units 02/19/2018 02/06/2018 02/05/2018  WBC 4.0 - 10.5 K/uL 5.4 6.8 7.1  Hemoglobin 13.0 - 17.0 g/dL 7.4(L) 10.5(L) 10.3(L)  Hematocrit 39.0 - 52.0 % 22.9(L) 31.7(L) 31.0(L)  Platelets 150 - 400 K/uL 218 215 193    Recent Labs  Lab 02/19/18 1729  NA 138  K 4.1  CL 107  CO2 20*  BUN 25*  ALBUMIN 3.1*  ALKPHOS 40  ALT 24  AST 22  GLUCOSE 122*  Creatinine 1.37 last evening, up from 1.19 on 02/05/2018.  GFR 50  Troponin negative x2 last evening  Recent Labs  Lab 02/19/18 2225  INR 1.60    Radiologic studies: Twelve-lead EKG report on file.  Reviewed in cardiology consult note.  None acute adult on two-view portable chest report on file from yesterday.  @ASSESSMENTPLANBEGIN @ Impression:  Melena Acute blood loss anemia Recent unstable angina with DES to RCA Recent onset atrial fibrillation, patient  was on triple therapy with aspirin Plavix and Eliquis, last doses of all of them yesterday. Coronary artery disease History of alcohol abuse  This appears most likely to be upper GI bleeding, suspected peptic ulcer disease from chronic low-dose aspirin use, now exacerbated by additional antiplatelet agent and oral anticoagulant.  Plan:  He has ongoing GI bleeding causing severely decreased hemoglobin in the setting of multiple antiplatelet and anticoagulant medicines.  His blood pressure and heart rate are currently stable and he does not have hematemesis.  Mild consumptive coagulopathy last evening. He has adequate peripheral IV access is on a Protonix drip, received 2 units of PRBCs, and repeat hemoglobin was drawn a little while ago.  He needs an upper endoscopy today.  The procedure was described in detail and he is agreeable after discussion of risks and benefits.  The benefits and risks of the planned procedure were described in detail with the patient or (when appropriate) their health care proxy.  Risks were outlined as including, but not limited to, bleeding, infection, perforation, adverse medication reaction leading to cardiac or pulmonary decompensation, or pancreatitis (if ERCP).  The limitation of incomplete mucosal visualization was also discussed.  No guarantees or warranties were given.  Patient at increased risk for cardiopulmonary complications of procedure due to medical comorbidities. He also understands that his DNR status must be reversed for 24 hours to allow endoscopic procedure.  It may be difficult to intervene on any lesion discovered with the medicines he has on board.  However, at least the Eliquis should be about out of his system even if there is still antiplatelet effect from the Plavix.  He understands the increased risks of the procedure and is willing to proceed. He will require anesthesia support due to his medical comorbidities and history of heavy alcohol  use.  Thank you for the courtesy of this consult.  Please contact me with any questions or concerns.  Charlie PitterHenry L Danis III Office: [phone]

## 2018-02-20 NOTE — Op Note (Signed)
[facility] Patient Name: Austin Malone Procedure Date : 02/20/2018 MRN: [id number] Attending MD: Starr Lake. Myrtie Neither , MD Date of Birth: 1940-03-30 CSN: [id number] Age: 78 Admit Type: Inpatient Procedure:                Upper GI endoscopy Indications:              Acute post hemorrhagic anemia, Melena (recent                            coronary DES, then pericarditis) Providers:                Starr Lake. Myrtie Neither, MD, Margaree Mackintosh, RN,                            Kandice Robinsons, Technician Referring MD:             [facility] Medicines:                Monitored Anesthesia Care Complications:            No immediate complications. Estimated Blood Loss:     Estimated blood loss: none. Procedure:                Pre-Anesthesia Assessment:                           - Prior to the procedure, a History and Physical                            was performed, and patient medications and                            allergies were reviewed. The patient's tolerance of                            previous anesthesia was also reviewed. The risks                            and benefits of the procedure and the sedation                            options and risks were discussed with the patient.                            All questions were answered, and informed consent                            was obtained. Prior Anticoagulants: The patient                            last took aspirin 1 day, Eliquis (apixaban) 1 day                            and Plavix (clopidogrel) 1 day prior to the  procedure. ASA Grade Assessment: IV - A patient                            with severe systemic disease that is a constant                            threat to life. After reviewing the risks and                            benefits, the patient was deemed in satisfactory                            condition to undergo the procedure.                           After  obtaining informed consent, the endoscope was                            passed under direct vision. Throughout the                            procedure, the patient's blood pressure, pulse, and                            oxygen saturations were monitored continuously. The                            GIF-H190 ([id number] Olympus gastroscope was                            introduced through the mouth, and advanced to the                            second part of duodenum. The upper GI endoscopy was                            accomplished without difficulty. The patient                            tolerated the procedure well. Scope In: Scope Out: Findings:      The esophagus was normal.      A medium-sized hiatal hernia was present.      The exam of the stomach was otherwise normal, including on retroflexion.      The examined duodenum was normal. Impression:               - Normal esophagus.                           - Medium-sized hiatal hernia.                           - Normal examined duodenum.                           -  No specimens collected. Recommendation:           - Return patient to hospital ward for ongoing care.                           - Clear liquid diet.                           - Perform a colonoscopy tomorrow. Procedure Code(s):        --- Professional ---                           [phone], Esophagogastroduodenoscopy, flexible,                            transoral; diagnostic, including collection of                            specimen(s) by brushing or washing, when performed                            (separate procedure) Diagnosis Code(s):        --- Professional ---                           K44.9, Diaphragmatic hernia without obstruction or                            gangrene                           D62, Acute posthemorrhagic anemia                           K92.1, Melena (includes Hematochezia) CPT copyright 2018 American Medical Association. All rights  reserved. The codes documented in this report are preliminary and upon coder review may  be revised to meet current compliance requirements. Henry L. Myrtie Neither, MD 02/20/2018 12:20:03 PM This report has been signed electronically. Number of Addenda: 0

## 2018-02-20 NOTE — Progress Notes (Signed)
Progress Note  Patient Name: Austin Malone Date of Encounter: 02/20/2018  Primary Cardiologist: Jenean Lindau, MD   Subjective   Chest pain has resolved. No recurrent melena  Inpatient Medications    Scheduled Meds: . alfuzosin  10 mg Oral Daily  . carvedilol  6.25 mg Oral BID  . colchicine  0.6 mg Oral Daily  . dorzolamide-timolol  1 drop Both Eyes BID  . finasteride  5 mg Oral Daily  . isosorbide mononitrate  60 mg Oral Daily  . latanoprost  1 drop Both Eyes QHS  . [START ON 02/21/2018] pantoprazole  40 mg Oral Q0600  . polyethylene glycol  4,000 mL Oral Once  . ranolazine  1,000 mg Oral BID   Continuous Infusions:  PRN Meds: acetaminophen **OR** acetaminophen, ALPRAZolam, HYDROcodone-acetaminophen, morphine injection, nitroGLYCERIN, ondansetron **OR** ondansetron (ZOFRAN) IV   Vital Signs    Vitals:   02/20/18 1219 02/20/18 1227 02/20/18 1241 02/20/18 1314  BP: (!) 156/72 (!) 156/76 (!) 180/86 (!) 151/69  Pulse: 77 68 69 67  Resp: '20 20 18 ' (!) 22  Temp: 98.7 F (37.1 C)   98.4 F (36.9 C)  TempSrc: Oral   Oral  SpO2: 97% 98% 97% 99%  Weight:      Height:        Intake/Output Summary (Last 24 hours) at 02/20/2018 1357 Last data filed at 02/20/2018 1211 Gross per 24 hour  Intake 1158.36 ml  Output 950 ml  Net 208.36 ml   Last 3 Weights 02/20/2018 02/06/2018 02/05/2018  Weight (lbs) 184 lb 1.4 oz 184 lb 1.6 oz 186 lb 14.4 oz  Weight (kg) 83.5 kg 83.507 kg 84.777 kg      Telemetry    SR - Personally Reviewed  ECG    na  Physical Exam   GEN: No acute distress.   Neck: No JVD Cardiac: RRR, no murmurs, rubs, or gallops.  Respiratory: Clear to auscultation bilaterally. GI: Soft, nontender, non-distended  MS: No edema; No deformity. Neuro:  Nonfocal  Psych: Normal affect   Labs    Chemistry Recent Labs  Lab 02/19/18 1729 02/20/18 0649  NA 138 138  K 4.1 3.7  CL 107 110  CO2 20* 21*  GLUCOSE 122* 90  BUN 25* 24*  CREATININE 1.37* 1.25*    CALCIUM 8.7* 8.1*  PROT 5.8* 5.2*  ALBUMIN 3.1* 2.9*  AST 22 22  ALT 24 24  ALKPHOS 40 37*  BILITOT 0.5 0.9  GFRNONAA 49* 54*  GFRAA 56* >60  ANIONGAP 11 7     Hematology Recent Labs  Lab 02/19/18 1729 02/20/18 0649  WBC 5.4 5.1  RBC 2.27* 2.90*  HGB 7.4* 9.1*  HCT 22.9* 27.4*  MCV 100.9* 94.5  MCH 32.6 31.4  MCHC 32.3 33.2  RDW 14.4 15.9*  PLT 218 190    Cardiac Enzymes Recent Labs  Lab 02/19/18 1729 02/19/18 2225 02/20/18 0649  TROPONINI <0.03 <0.03 <0.03   No results for input(s): TROPIPOC in the last 168 hours.   BNPNo results for input(s): BNP, PROBNP in the last 168 hours.   DDimer No results for input(s): DDIMER in the last 168 hours.   Radiology    Dg Chest 2 View  Result Date: 02/19/2018 CLINICAL DATA:  78 year old male with chest pain, nausea and vomiting. EXAM: CHEST - 2 VIEW COMPARISON:  [facility] portable chest x-ray 02/08/2018 and earlier. FINDINGS: Semi upright AP and lateral views. Stable sequelae of CABG. Mildly lower lung volumes compared to prior  two view exams. Mediastinal contours remain within normal limits. Visualized tracheal air column is within normal limits. No pneumothorax or pulmonary edema. Small chronic calcified granuloma in the right lung near the minor fissure. No pleural effusion or confluent pulmonary opacity. No acute osseous abnormality identified. Negative visible bowel gas pattern. IMPRESSION: No acute cardiopulmonary abnormality. Electronically Signed   By: Genevie Ann M.D.   On: 02/19/2018 18:06    Cardiac Studies     Patient Profile     Austin Malone a 78 y.o.malewith hx of CAD s/p CABG with graft stenosis and prior PCIs to VG to PDA andchronically occluded VG to diag and VG to OM and patent LIMA,pt with increasing angina so 02/01/18 had PCI to [facility] with orbital atherectomy and PCI to dRCA and rPL with DES,HLD, HTN, hx MI, PUD admitted with chest pain and malena.  Assessment & Plan    1. CAD - history  of prior CABG - Jan 2020 cath DES to mid RCA and DES to distal RCA in setting of unstable angina. Discharged on ASA/plavix. - Jan 2020 ehco LVEF 55-60%, no WMAs - admitted few days later after PCI with chest pain, treated for pericarditis with colchcine. Diagnosed with PAF this admission, started on eliquis.  NSAIDs and prednsione avoided due to need for triple therapy.    Difficult situation. Recents stents placed less than 1 month ago. Melena occurred in setting of triple therapy.  - in the absence of hemodynamically significant bleeding would restart at least his plavix. If stent were to thrombose would be dealing with a much higher acuity than what we are dealing with now.    2. Chest pain/Probable pericarditis - recent CAD history as reported above. Subsequent admission with management for pericaditis, only on colchicine. Wanted to avoid NSAIDs and prednisone due to need for triple therapy. CRP at that tine 6.9, ESR 33   - EKG with lateral ST/T changes are chronic - trops this admission are negative - chest pain worst with laying flat, better with leaning forward - pain resolved this AM. Limited echo to evaluate for possible effusion.     3. Melena - occurred on triple therapy for recent RCA stenting and new diagnosis of afib.  - given 2 units of pRBCs    4. PAF - hold eliquis in setting of GI bleed    For questions or updates, please contact [facility] Please consult [email or web address] for contact info under        Signed, Carlyle Dolly, MD  02/20/2018, 1:57 PM

## 2018-02-20 NOTE — Anesthesia Preprocedure Evaluation (Signed)
Anesthesia Evaluation  Patient identified by MRN, date of birth, ID band Patient awake    Reviewed: Allergy & Precautions, NPO status , Patient's Chart, lab work & pertinent test results  Airway Mallampati: I  TM Distance: >3 FB Neck ROM: Full    Dental   Pulmonary former smoker,    Pulmonary exam normal        Cardiovascular hypertension, Pt. on medications + CAD, + Past MI, + Cardiac Stents and + CABG  Normal cardiovascular exam     Neuro/Psych Anxiety    GI/Hepatic GERD  Medicated and Controlled,  Endo/Other    Renal/GU      Musculoskeletal   Abdominal   Peds  Hematology   Anesthesia Other Findings   Reproductive/Obstetrics                             Anesthesia Physical Anesthesia Plan  ASA: III  Anesthesia Plan: MAC   Post-op Pain Management:    Induction: Intravenous  PONV Risk Score and Plan: 1 and Treatment may vary due to age or medical condition  Airway Management Planned: Simple Face Mask  Additional Equipment:   Intra-op Plan:   Post-operative Plan:   Informed Consent: I have reviewed the patients History and Physical, chart, labs and discussed the procedure including the risks, benefits and alternatives for the proposed anesthesia with the patient or authorized representative who has indicated his/her understanding and acceptance.       Plan Discussed with: CRNA and Surgeon  Anesthesia Plan Comments:         Anesthesia Quick Evaluation

## 2018-02-20 NOTE — H&P (View-Only) (Signed)
[facility] Consult Note   History Austin Malone MRN # [id number]  Date of Admission: 02/19/2018 Date of Consultation: 02/20/2018 Referring physician: Dr. Jerral RalphGhimire, Werner LeanShanker M, MD Primary Care Provider: Shelbie AmmonsHaque, Imran P, MD Primary Gastroenterologist: None   Reason for Consultation/Chief Complaint: Melena  Subjective  HPI:  This is a 78 year old man admitted last evening for GI bleeding and chest pain.  He has a complicated recent cardiac history tentatively outlined in his admission history and physical and cardiology consultation, both of which I clearly reviewed.  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  He had been on aspirin prior to that, Plavix was then added after stent placement.  Shortly after that the patient was admitted to [facility] and [facility] with chest pain found to be from pericarditis.  This was apparently treated with colchicine, patient denies having taken any NSAIDs recently but he seems more uncertain about that.  During the pericarditis he developed new onset atrial fibrillation which apparently converted to sinus rhythm, and he was put on Eliquis.  His last Eliquis dose was probably about 1 PM yesterday by his report. He was seen in cardiology consult last night and his intermittent 2 out of 10 chest pain is from pericarditis.  He does not have coronary syndrome at present. He was found to have a hemoglobin that it dropped about 3 g from the most recent 1, and he describes several days of black tarry stool that had apparently been going on since just after his recent discharge.  He has a possible previous history of ulcer, says there was a time when he was taking Excedrin many years ago. He received 2 units of PRBCs overnight.  At the moment he denies any chest pain, abdominal pain or dyspnea.  He denies hematemesis,  dysphagia, odynophagia, vomiting, or recent anorexia or weight loss.  ROS:  All other systems are negative except as noted above in the HPI  Past Medical History Past Medical History:  Diagnosis Date  . Anxiety   . Arthritis   . BPH (benign prostatic hyperplasia)   . CAD (coronary artery disease)   . Cataracts, bilateral   . Enlarged prostate   . GERD (gastroesophageal reflux disease)   . Glaucoma   . Hyperlipidemia   . Hypertension   . Non-ST elevation (NSTEMI) myocardial infarction (HCC) 08/21/2017  . Pneumonia   . PUD (peptic ulcer disease)   . Rotator cuff tear     Past Surgical History Past Surgical History:  Procedure Laterality Date  . APPENDECTOMY    . CORONARY ANGIOPLASTY WITH STENT PLACEMENT  11/2014   PCI of SVG to PDA x 2 with Xience DES to proximal stenosis, Ultra 5.0 BMS to distal thrombotic lesion  . CORONARY ARTERY BYPASS GRAFT  2008  . CORONARY ATHERECTOMY N/A 02/01/2018   Procedure: CORONARY ATHERECTOMY;  Surgeon: Yvonne KendallEnd, Christopher, MD;  Location: [facility] INVASIVE CV LAB;  Service: Cardiovascular;  Laterality: N/A;  . CORONARY STENT INTERVENTION N/A 02/01/2018   Procedure: CORONARY STENT INTERVENTION;  Surgeon: Yvonne KendallEnd, Christopher, MD;  Location: [facility] INVASIVE CV LAB;  Service: Cardiovascular;  Laterality: N/A;  . EYE SURGERY    . INTRAVASCULAR ULTRASOUND/IVUS N/A 02/01/2018   Procedure: Intravascular Ultrasound/IVUS;  Surgeon: Yvonne KendallEnd, Christopher, MD;  Location: [facility] INVASIVE CV LAB;  Service: Cardiovascular;  Laterality: N/A;  . LEFT HEART CATH AND CORS/GRAFTS ANGIOGRAPHY N/A 08/21/2017   Procedure:  LEFT HEART CATH AND CORS/GRAFTS ANGIOGRAPHY;  Surgeon: Yvonne KendallEnd, Christopher, MD;  Location: [facility] INVASIVE CV LAB;  Service: Cardiovascular;  Laterality: N/A;  . LEFT HEART CATH AND CORS/GRAFTS ANGIOGRAPHY N/A 02/01/2018   Procedure: LEFT HEART CATH AND CORS/GRAFTS ANGIOGRAPHY;  Surgeon: Yvonne KendallEnd, Christopher, MD;  Location: [facility] INVASIVE CV LAB;  Service: Cardiovascular;  Laterality: N/A;  . LEG  SURGERY    . TONSILLECTOMY      Family History Family History  Problem Relation Age of Onset  . Diabetes Mother   . Heart attack Brother     Social History Social History   Socioeconomic History  . Marital status: Married    Spouse name: Not on file  . Number of children: Not on file  . Years of education: Not on file  . Highest education level: Not on file  Occupational History  . Occupation: Retired  Engineer, productionocial Needs  . Financial resource strain: Not on file  . Food insecurity:    Worry: Not on file    Inability: Not on file  . Transportation needs:    Medical: Not on file    Non-medical: Not on file  Tobacco Use  . Smoking status: Former Games developermoker  . Smokeless tobacco: Never Used  Substance and Sexual Activity  . Alcohol use: Yes    Alcohol/week: 21.0 standard drinks    Types: 21 Cans of beer per week  . Drug use: Yes    Types: Marijuana  . Sexual activity: Not on file  Lifestyle  . Physical activity:    Days per week: Not on file    Minutes per session: Not on file  . Stress: Not on file  Relationships  . Social connections:    Talks on phone: Not on file    Gets together: Not on file    Attends religious service: Not on file    Active member of club or organization: Not on file    Attends meetings of clubs or organizations: Not on file    Relationship status: Not on file  Other Topics Concern  . Not on file  Social History Narrative   [place].    Allergies Allergies  Allergen Reactions  . Codeine Other (See Comments)    unknown  . Lisinopril Cough  . Contrast Media [Iodinated Diagnostic Agents] Hives and Rash    Outpatient Meds Home medications from the H+P and/or nursing med reconciliation reviewed.  Inpatient med list reviewed  _____________________________________________________________________ Objective   Exam:  Current vital signs  Patient Vitals for the past 8 hrs:  BP Temp Temp src Pulse Resp  SpO2  02/20/18 0515 131/74 97.9 F (36.6 C) Oral 66 17 97 %  02/20/18 0221 129/67 98.1 F (36.7 C) Oral 67 20 96 %  02/20/18 0148 (!) 110/49 98.3 F (36.8 C) Oral 69 16 98 %    Intake/Output Summary (Last 24 hours) at 02/20/2018 0715 Last data filed at 02/20/2018 0500 Gross per 24 hour  Intake 759.38 ml  Output 650 ml  Net 109.38 ml    Physical Exam:    General: this is an early male patient in no acute distress.  He is alert and conversational  Eyes: sclera anicteric, no redness  ENT: oral mucosa moist without lesions, no cervical or supraclavicular lymphadenopathy  CV: RRR without murmur or rub, S1/S2, no JVD,, no peripheral edema.  Sternotomy scar  Resp: clear to auscultation bilaterally, normal RR and effort noted  GI: soft, no tenderness, with  active bowel sounds. No guarding or palpable organomegaly noted  Skin; warm and dry, no rash or jaundice noted.  Pale  Neuro: awake, alert and oriented x 3. Normal gross motor function and fluent speech.  Labs:  Recent Labs  Lab 02/19/18 1729  WBC 5.4  HGB 7.4*  HCT 22.9*  PLT 218   CBC Latest Ref Rng & Units 02/19/2018 02/06/2018 02/05/2018  WBC 4.0 - 10.5 K/uL 5.4 6.8 7.1  Hemoglobin 13.0 - 17.0 g/dL 7.4(L) 10.5(L) 10.3(L)  Hematocrit 39.0 - 52.0 % 22.9(L) 31.7(L) 31.0(L)  Platelets 150 - 400 K/uL 218 215 193    Recent Labs  Lab 02/19/18 1729  NA 138  K 4.1  CL 107  CO2 20*  BUN 25*  ALBUMIN 3.1*  ALKPHOS 40  ALT 24  AST 22  GLUCOSE 122*  Creatinine 1.37 last evening, up from 1.19 on 02/05/2018.  GFR 50  Troponin negative x2 last evening  Recent Labs  Lab 02/19/18 2225  INR 1.60    Radiologic studies: Twelve-lead EKG report on file.  Reviewed in cardiology consult note.  None acute adult on two-view portable chest report on file from yesterday.  @ASSESSMENTPLANBEGIN @ Impression:  Melena Acute blood loss anemia Recent unstable angina with DES to RCA Recent onset atrial fibrillation, patient  was on triple therapy with aspirin Plavix and Eliquis, last doses of all of them yesterday. Coronary artery disease History of alcohol abuse  This appears most likely to be upper GI bleeding, suspected peptic ulcer disease from chronic low-dose aspirin use, now exacerbated by additional antiplatelet agent and oral anticoagulant.  Plan:  He has ongoing GI bleeding causing severely decreased hemoglobin in the setting of multiple antiplatelet and anticoagulant medicines.  His blood pressure and heart rate are currently stable and he does not have hematemesis.  Mild consumptive coagulopathy last evening. He has adequate peripheral IV access is on a Protonix drip, received 2 units of PRBCs, and repeat hemoglobin was drawn a little while ago.  He needs an upper endoscopy today.  The procedure was described in detail and he is agreeable after discussion of risks and benefits.  The benefits and risks of the planned procedure were described in detail with the patient or (when appropriate) their health care proxy.  Risks were outlined as including, but not limited to, bleeding, infection, perforation, adverse medication reaction leading to cardiac or pulmonary decompensation, or pancreatitis (if ERCP).  The limitation of incomplete mucosal visualization was also discussed.  No guarantees or warranties were given.  Patient at increased risk for cardiopulmonary complications of procedure due to medical comorbidities. He also understands that his DNR status must be reversed for 24 hours to allow endoscopic procedure.  It may be difficult to intervene on any lesion discovered with the medicines he has on board.  However, at least the Eliquis should be about out of his system even if there is still antiplatelet effect from the Plavix.  He understands the increased risks of the procedure and is willing to proceed. He will require anesthesia support due to his medical comorbidities and history of heavy alcohol  use.  Thank you for the courtesy of this consult.  Please contact me with any questions or concerns.  [name] Office: [phone]

## 2018-02-21 ENCOUNTER — Inpatient Hospital Stay (HOSPITAL_COMMUNITY): Payer: Medicare HMO

## 2018-02-21 ENCOUNTER — Encounter (HOSPITAL_COMMUNITY)
Admission: EM | Disposition: A | Discharge status: Home Health | Payer: Self-pay | Source: Home / Self Care | Attending: Internal Medicine

## 2018-02-21 ENCOUNTER — Inpatient Hospital Stay (HOSPITAL_COMMUNITY): Payer: Medicare HMO | Admitting: Certified Registered Nurse Anesthetist

## 2018-02-21 DIAGNOSIS — R079 Chest pain, unspecified: Secondary | ICD-10-CM

## 2018-02-21 HISTORY — PX: COLONOSCOPY WITH PROPOFOL: SHX5780

## 2018-02-21 LAB — BASIC METABOLIC PANEL
Anion gap: 12 (ref 5–15)
BUN: 16 mg/dL (ref 8–23)
CO2: 19 mmol/L — ABNORMAL LOW (ref 22–32)
Calcium: 8.6 mg/dL — ABNORMAL LOW (ref 8.9–10.3)
Chloride: 107 mmol/L (ref 98–111)
Creatinine, Ser: 1.16 mg/dL (ref 0.61–1.24)
GFR calc Af Amer: >60 mL/min (ref >60)
GFR calc non Af Amer: 60 mL/min — ABNORMAL LOW (ref >60)
Glucose, Bld: 102 mg/dL — ABNORMAL HIGH (ref 70–99)
Potassium: 3.5 mmol/L (ref 3.5–5.1)
Sodium: 138 mmol/L (ref 135–145)

## 2018-02-21 LAB — CBC
HCT: 29.3 % — ABNORMAL LOW (ref 39.0–52.0)
Hemoglobin: 9.6 g/dL — ABNORMAL LOW (ref 13.0–17.0)
MCH: 31.4 pg (ref 26.0–34.0)
MCHC: 32.8 g/dL (ref 30.0–36.0)
MCV: 95.8 fL (ref 80.0–100.0)
Platelets: 211 10*3/uL (ref 150–400)
RBC: 3.06 MIL/uL — ABNORMAL LOW (ref 4.22–5.81)
RDW: 16 % — ABNORMAL HIGH (ref 11.5–15.5)
WBC: 6 10*3/uL (ref 4.0–10.5)
nRBC: 0 % (ref 0.0–0.2)

## 2018-02-21 LAB — ECHOCARDIOGRAM LIMITED
HEIGHTINCHES: 70 in
Weight: 2945.35 oz

## 2018-02-21 SURGERY — COLONOSCOPY WITH PROPOFOL
Anesthesia: Monitor Anesthesia Care

## 2018-02-21 MED ORDER — LACTATED RINGERS IV SOLN: INTRAVENOUS | Status: DC

## 2018-02-21 MED ORDER — ASPIRIN EC 81 MG PO TBEC
81.0000 mg | DELAYED_RELEASE_TABLET | Freq: Every day | ORAL | Status: DC
  Administered 2018-02-21 – 2018-02-22 (×2): 81 mg via ORAL
  Filled 2018-02-21 (×2): qty 1

## 2018-02-21 MED ORDER — PROPOFOL 10 MG/ML IV BOLUS
INTRAVENOUS | Status: DC | PRN
  Administered 2018-02-21 (×2): 20 mg via INTRAVENOUS

## 2018-02-21 MED ORDER — SODIUM CHLORIDE 0.9 % IV SOLN
INTRAVENOUS | Status: DC
  Administered 2018-02-21 (×2): via INTRAVENOUS

## 2018-02-21 MED ORDER — PROPOFOL 500 MG/50ML IV EMUL
INTRAVENOUS | Status: DC | PRN
  Administered 2018-02-21: 100 ug/kg/min via INTRAVENOUS

## 2018-02-21 SURGICAL SUPPLY — 22 items

## 2018-02-21 NOTE — Anesthesia Preprocedure Evaluation (Signed)
Anesthesia Evaluation  Patient identified by MRN, date of birth, ID band Patient awake    Reviewed: Allergy & Precautions, NPO status , Patient's Chart, lab work & pertinent test results  Airway Mallampati: II  TM Distance: >3 FB     Dental   Pulmonary pneumonia, former smoker,    breath sounds clear to auscultation       Cardiovascular hypertension, + angina + CAD, + Past MI and +CHF   Rhythm:Regular Rate:Normal     Neuro/Psych    GI/Hepatic Neg liver ROS, PUD, GERD  ,  Endo/Other    Renal/GU Renal disease     Musculoskeletal   Abdominal   Peds  Hematology  (+) anemia ,   Anesthesia Other Findings   Reproductive/Obstetrics                             Anesthesia Physical Anesthesia Plan  ASA: IV  Anesthesia Plan: MAC   Post-op Pain Management:    Induction: Intravenous  PONV Risk Score and Plan: 1 and Treatment may vary due to age or medical condition and Propofol infusion  Airway Management Planned: Nasal Cannula and Simple Face Mask  Additional Equipment:   Intra-op Plan:   Post-operative Plan:   Informed Consent: I have reviewed the patients History and Physical, chart, labs and discussed the procedure including the risks, benefits and alternatives for the proposed anesthesia with the patient or authorized representative who has indicated his/her understanding and acceptance.     Dental advisory given  Plan Discussed with: CRNA and Anesthesiologist  Anesthesia Plan Comments:         Anesthesia Quick Evaluation

## 2018-02-21 NOTE — Progress Notes (Signed)
Pt completed Moviprep, last BM liquid, no solid pieces. Np on call notified. Endoscopy RN Victorino Dike notified when report was given at 0700.  Endoscopy, RN will notify MD.

## 2018-02-21 NOTE — Op Note (Signed)
[facility] Patient Name: Austin BibleJames Ledford Procedure Date : 02/21/2018 MRN: [id number] Attending MD: Starr LakeHenry L. Myrtie Neitheranis , MD Date of Birth: Feb 17, 1940 CSN: [id number] Age: [age] Admit Type: Inpatient Procedure:                Colonoscopy Indications:              Melena, Acute post hemorrhagic anemia (no source on                            EGD yesterday) Providers:                Starr LakeHenry L. Myrtie Neitheranis, MD, Jacquiline DoeJennifer Zhu, RN, Kandice RobinsonsGuillaume                            [place], Technician Referring MD:             [facility] Medicines:                Monitored Anesthesia Care Complications:            No immediate complications. Estimated Blood Loss:     Estimated blood loss: none. Estimated blood loss                            was minimal (self-limited bleeding from superficial                            mucosal tear near hepatic flexure due to scope                            looping). Procedure:                Pre-Anesthesia Assessment:                           - Prior to the procedure, a History and Physical                            was performed, and patient medications and                            allergies were reviewed. The patient's tolerance of                            previous anesthesia was also reviewed. The risks                            and benefits of the procedure and the sedation                            options and risks were discussed with the patient.                            All questions were answered, and informed consent                            was obtained.  Prior Anticoagulants: The patient                            last took aspirin 2 days, Eliquis (apixaban) 2 days                            and Plavix (clopidogrel) 2 days prior to the                            procedure. ASA Grade Assessment: IV - A patient                            with severe systemic disease that is a constant                            threat to life. After reviewing  the risks and                            benefits, the patient was deemed in satisfactory                            condition to undergo the procedure.                           After obtaining informed consent, the colonoscope                            was passed under direct vision. Throughout the                            procedure, the patient's blood pressure, pulse, and                            oxygen saturations were monitored continuously. The                            [id number]-[id number] ([id number] Olympus colonoscope was                            introduced through the anus and advanced to the the                            cecum, identified by appendiceal orifice and                            ileocecal valve. The colonoscopy was performed with                            difficulty due to a redundant colon and significant                            looping. Successful completion of the procedure was  aided by changing the patient to a supine position                            and using manual pressure. The patient tolerated                            the procedure well. The quality of the bowel                            preparation was good. The ileocecal valve,                            appendiceal orifice, and rectum were photographed.                            The terminal ileum could not be intubated due to                            scope looping/redundant colon. Scope In: 8:15:22 AM Scope Out: 8:35:50 AM Scope Withdrawal Time: 0 hours 8 minutes 4 seconds  Total Procedure Duration: 0 hours 20 minutes 28 seconds  Findings:      The perianal and digital rectal examinations were normal.      The colon (entire examined portion) was significantly redundant, causing       scope looping.      The exam was otherwise without abnormality on direct and retroflexion       views. Impression:               - Redundant colon.                           - The  examination was otherwise normal on direct                            and retroflexion views.                           - No specimens collected. Recommendation:           - Return patient to hospital ward for ongoing care.                           - Resume regular diet.                           - Resume aspirin and Plavix (clopidogrel) at prior                            dose today. Patient had a DES placed recently.                           - If anticoagulation felt to be strongly indicated,                            consider resuming unfractionated heparin while  patient is hospitalized. If recurrent melena and                            worsening anemia, small bowel video capsule study                            would then be indicated.                           - Daily CBC Procedure Code(s):        --- Professional ---                           [phone], Colonoscopy, flexible; diagnostic, including                            collection of specimen(s) by brushing or washing,                            when performed (separate procedure) Diagnosis Code(s):        --- Professional ---                           K92.1, Melena (includes Hematochezia)                           D62, Acute posthemorrhagic anemia                           Q43.8, Other specified congenital malformations of                            intestine CPT copyright 2018 American Medical Association. All rights reserved. The codes documented in this report are preliminary and upon coder review may  be revised to meet current compliance requirements. Rakesha Dalporto L. Myrtie Neither, MD 02/21/2018 9:06:47 AM This report has been signed electronically. Number of Addenda: [phone]

## 2018-02-21 NOTE — Interval H&P Note (Signed)
History and Physical Interval Note:  02/21/2018 8:00 AM  Austin Malone  has presented today for surgery, with the diagnosis of GI bleed  The various methods of treatment have been discussed with the patient and family. After consideration of risks, benefits and other options for treatment, the patient has consented to  Procedure(s): COLONOSCOPY WITH PROPOFOL (N/A) as a surgical intervention .  The patient's history has been reviewed, patient examined, no change in status, stable for surgery.  I have reviewed the patient's chart and labs.  Questions were answered to the patient's satisfaction.    Hemoglobin 9.6 today  [name]

## 2018-02-21 NOTE — Anesthesia Postprocedure Evaluation (Signed)
Anesthesia Post Note  Patient: Austin Malone  Procedure(s) Performed: COLONOSCOPY WITH PROPOFOL (N/A )     Patient location during evaluation: Endoscopy Anesthesia Type: MAC Level of consciousness: awake Pain management: pain level controlled Vital Signs Assessment: post-procedure vital signs reviewed and stable Respiratory status: spontaneous breathing Cardiovascular status: stable Postop Assessment: no apparent nausea or vomiting Anesthetic complications: no    Last Vitals:  Vitals:   02/21/18 0900 02/21/18 0952  BP: (!) 171/73 (!) 158/67  Pulse: 72   Resp: 20 20  Temp:  36.9 C  SpO2: 98% 98%    Last Pain:  Vitals:   02/21/18 0952  TempSrc: Oral  PainSc: 0-No pain                 Arlene Brickel

## 2018-02-21 NOTE — Progress Notes (Signed)
No additional recs today, patient restarted on ASA/plavix today. Remains off eliquis, ongoing workup for source of bleed. Continue colchcine for pericarditis. Call with questions over the weekend   Dominga Ferry MD

## 2018-02-21 NOTE — Plan of Care (Signed)

## 2018-02-21 NOTE — Progress Notes (Signed)
  Echocardiogram 2D Echocardiogram has been performed.  Tye Savoy 02/21/2018, 4:09 PM

## 2018-02-21 NOTE — Transfer of Care (Signed)
Immediate Anesthesia Transfer of Care Note  Patient: Austin Malone  Procedure(s) Performed: COLONOSCOPY WITH PROPOFOL (N/A )  Patient Location: Endoscopy Unit  Anesthesia Type:MAC  Level of Consciousness: awake, alert  and oriented  Airway & Oxygen Therapy: Patient Spontanous Breathing and Patient connected to nasal cannula oxygen  Post-op Assessment: Report given to RN and Post -op Vital signs reviewed and stable  Post vital signs: Reviewed and stable  Last Vitals:  Vitals Value Taken Time  BP    Temp    Pulse    Resp    SpO2      Last Pain:  Vitals:   02/21/18 0756  TempSrc: Oral  PainSc: 0-No pain         Complications: No apparent anesthesia complications

## 2018-02-21 NOTE — Progress Notes (Signed)
PROGRESS NOTE        PATIENT DETAILS Name: Austin Malone Age: 78 y.o. Sex: male Date of Birth: May 04, 1940 Admit Date: 02/19/2018 Admitting Physician Therisa Doyne, MD [id number], Myrene Galas, MD  Brief Narrative: Patient is a 78 y.o. 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  See below for further details.  Subjective: Claims that at the end of the colonoscopy prep-stools were light brown in color.  No further melena.  Assessment/Plan: Upper GI bleeding with acute blood loss anemia: Now seems to have resolved, hemoglobin stable after 2 units of PRBC transfusion.  EGD/colonoscopy negative for obvious bleeding etiology.  Aspirin/Plavix has been restarted given recent PCI, will continue to hold Eliquis.  Observe for another day-if stable-we will discharge home tomorrow.      CAD with recent PCI to [facility] with orbital atherectomy and DES and PCI to dRCA and rPL with DES on 1/20: No anginal symptoms, given GI bleeding-all antiplatelets were held-since EGD/colonoscopy negative-has been restarted on aspirin/Plavix.  AKI: Mild AKI likely hemodynamically mediated-resolved.  PAF: Sinus rhythm on telemetry-Eliquis on hold.  Continue rate control with Coreg.  Pericarditis: This occurred post PCI on 1/20-currently denies any chest pain-remains on colchicine.  Await limited echocardiogram.   Chronic diastolic heart failure: Euvolemic on exam-follow volume status closely.  Hypertension: Blood pressure slightly up-monitor 1 more day on Imdur and Coreg.  BPH: Continue finasteride and alfuzosin  DVT Prophylaxis: SCD's  Code Status: DNR  Family Communication: None at bedside  Disposition Plan: Remain inpatient hopefully home on  2/10.  Antimicrobial agents: Anti-infectives (From admission, onward)   None      Procedures: None  CONSULTS:  cardiology and GI  Time spent: 25- minutes-Greater than 50% of this time was spent in counseling, explanation of diagnosis, planning of further management, and coordination of care.  MEDICATIONS: Scheduled Meds: . alfuzosin  10 mg Oral Daily  . aspirin EC  81 mg Oral Daily  . carvedilol  6.25 mg Oral BID  . clopidogrel  75 mg Oral Daily  . colchicine  0.6 mg Oral Daily  . dorzolamide-timolol  1 drop Both Eyes BID  . finasteride  5 mg Oral Daily  . isosorbide mononitrate  60 mg Oral Daily  . latanoprost  1 drop Both Eyes QHS  . pantoprazole  40 mg Oral Q0600  . ranolazine  1,000 mg Oral BID   Continuous Infusions:  PRN Meds:.acetaminophen **OR** acetaminophen, ALPRAZolam, HYDROcodone-acetaminophen, morphine injection, nitroGLYCERIN, ondansetron **OR** ondansetron (ZOFRAN) IV   PHYSICAL EXAM: Vital signs: Vitals:   02/21/18 0843 02/21/18 0855 02/21/18 0900 02/21/18 0952  BP: (!) 143/56 (!) 143/71 (!) 171/73 (!) 158/67  Pulse: 62 69 72   Resp: 17 (!) 21 20 20   Temp:    98.4 F (36.9 C)  TempSrc:    Oral  SpO2: 100% 98% 98% 98%  Weight:      Height:       Filed Weights   02/20/18 1122 02/21/18 0756  Weight: 83.5 kg 83.5 kg   Body mass index is 26.41 kg/m.   General appearance:Awake, alert, not in any distress.  Eyes:no scleral icterus.  HEENT: Atraumatic and Normocephalic Neck: supple, no JVD. Resp:Good air entry bilaterally,no rales or rhonchi CVS: S1 S2 regular GI: Bowel sounds present, Non tender and not distended with no gaurding, rigidity or rebound. Extremities: B/L Lower Ext shows no edema, both legs are warm to touch Neurology:  Non focal Psychiatric: Normal judgment and insight. Normal mood. Musculoskeletal:No digital cyanosis Skin:No Rash, warm and dry Wounds:N/A  I have personally reviewed following labs and imaging  studies  LABORATORY DATA: CBC: Recent Labs  Lab 02/19/18 1729 02/20/18 0649 02/21/18 0427  WBC 5.4 5.1 6.0  NEUTROABS 3.2  --   --   HGB 7.4* 9.1* 9.6*  HCT 22.9* 27.4* 29.3*  MCV 100.9* 94.5 95.8  PLT 218 190 211    Basic Metabolic Panel: Recent Labs  Lab 02/19/18 1729 02/20/18 0649 02/21/18 0427  NA 138 138 138  K 4.1 3.7 3.5  CL 107 110 107  CO2 20* 21* 19*  GLUCOSE 122* 90 102*  BUN 25* 24* 16  CREATININE 1.37* 1.25* 1.16  CALCIUM 8.7* 8.1* 8.6*  MG  --  1.9  --   PHOS  --  3.4  --     GFR: Estimated Creatinine Clearance: 53.3 mL/min (by C-G formula based on SCr of 1.16 mg/dL).  Liver Function Tests: Recent Labs  Lab 02/19/18 1729 02/20/18 0649  AST 22 22  ALT 24 24  ALKPHOS 40 37*  BILITOT 0.5 0.9  PROT 5.8* 5.2*  ALBUMIN 3.1* 2.9*   No results for input(s): LIPASE, AMYLASE in the last 168 hours. No results for input(s): AMMONIA in the last 168 hours.  Coagulation Profile: Recent Labs  Lab 02/19/18 2225  INR 1.60    Cardiac Enzymes: Recent Labs  Lab 02/19/18 1729 02/19/18 2225 02/20/18 0649 02/20/18 1259  TROPONINI <0.03 <0.03 <0.03 <0.03    BNP (last 3 results) No results for input(s): PROBNP in the last 8760 hours.  HbA1C: Recent Labs    02/20/18 0649  HGBA1C 5.8*    CBG: No results for input(s): GLUCAP in the last 168 hours.  Lipid Profile: No results for input(s): CHOL, HDL, LDLCALC, TRIG, CHOLHDL, LDLDIRECT in the last 72 hours.  Thyroid Function Tests: Recent Labs    02/20/18 0649  TSH 1.058    Anemia Panel: No results for input(s): VITAMINB12, FOLATE, FERRITIN, TIBC, IRON, RETICCTPCT in the last 72 hours.  Urine analysis: No results found for: COLORURINE, APPEARANCEUR, LABSPEC, PHURINE, GLUCOSEU, HGBUR, BILIRUBINUR, KETONESUR, PROTEINUR, UROBILINOGEN, NITRITE, LEUKOCYTESUR  Sepsis Labs: Lactic Acid, Venous No results found for: LATICACIDVEN  MICROBIOLOGY: Recent Results (from the past 240 hour(s))   MRSA PCR Screening     Status: None   Collection Time: 02/20/18  3:44 AM  Result Value Ref Range Status   MRSA by PCR NEGATIVE NEGATIVE Final    Comment:        The GeneXpert MRSA Assay (FDA approved for NASAL specimens only), is one component of a comprehensive MRSA colonization surveillance program. It is not intended to diagnose MRSA infection nor to guide or monitor treatment for MRSA infections. Performed at [facility], [address] [place], [place] [id number]     RADIOLOGY STUDIES/RESULTS: Dg Chest 2 View  Result Date: 02/19/2018 CLINICAL DATA:  78 year old male with chest pain, nausea and vomiting. EXAM: CHEST - 2 VIEW COMPARISON:  [facility] portable chest x-ray 02/08/2018 and earlier. FINDINGS: Semi upright AP and lateral views. Stable sequelae of CABG. Mildly lower lung volumes compared to prior two view  exams. Mediastinal contours remain within normal limits. Visualized tracheal air column is within normal limits. No pneumothorax or pulmonary edema. Small chronic calcified granuloma in the right lung near the minor fissure. No pleural effusion or confluent pulmonary opacity. No acute osseous abnormality identified. Negative visible bowel gas pattern. IMPRESSION: No acute cardiopulmonary abnormality. Electronically Signed   By: Odessa FlemingH  Hall M.D.   On: 02/19/2018 18:06     LOS: 2 days   Jeoffrey MassedShanker , MD  [facility]  If 7PM-7AM, please contact night-coverage  Please page via [email or web address]  Go to [email or web address] and use [place]'s universal password to access. If you do not have the password, please contact the hospital operator.  Locate the [facility] provider you are looking for under [facility] and page to a number that you can be directly reached. If you still have difficulty reaching the provider, please page the [facility] (Director on Call) for the Hospitalists listed on amion for assistance.  02/21/2018, 12:36 PM

## 2018-02-22 LAB — CBC
HCT: 28.3 % — ABNORMAL LOW (ref 39.0–52.0)
Hemoglobin: 9.6 g/dL — ABNORMAL LOW (ref 13.0–17.0)
MCH: 32.7 pg (ref 26.0–34.0)
MCHC: 33.9 g/dL (ref 30.0–36.0)
MCV: 96.3 fL (ref 80.0–100.0)
Platelets: 206 10*3/uL (ref 150–400)
RBC: 2.94 MIL/uL — ABNORMAL LOW (ref 4.22–5.81)
RDW: 15.7 % — ABNORMAL HIGH (ref 11.5–15.5)
WBC: 5.4 10*3/uL (ref 4.0–10.5)
nRBC: 0 % (ref 0.0–0.2)

## 2018-02-22 NOTE — Progress Notes (Signed)
Ival Bible to be D/C'd home per MD order. Discussed with the patient and all questions fully answered. VVS, Skin clean, dry and intact without evidence of skin break down, no evidence of skin tears noted.  IV catheters discontinued intact. Site without signs and symptoms of complications. Dressing and pressure applied.  An After Visit Summary was printed and given to the patient.  Patient escorted via WC, and D/C home via taxi (voucher given).  Jon Gills  02/22/2018 11:47 AM

## 2018-02-22 NOTE — Progress Notes (Signed)
Per discussion with Dr. Duke Salvia, recommend moving pt's appt to 1-2 weeks - have r/s from 2/13 to 2/18 and spoke with pt to make him aware. Dayna Dunn PA-C

## 2018-02-22 NOTE — Progress Notes (Signed)
    Progress Note   Subjective  Chief Complaint: Melena  Patient is doing well this morning, tells me he has been started back on his Plavix and aspirin.  He has not had a bowel movement since time of colonoscopy and wonders if he needs a laxative.  Ate a regular diet this morning.  Denies any new complaints.    Objective   Vital signs in last 24 hours: Temp:  [92.4 F (33.6 C)-98.1 F (36.7 C)] 92.4 F (33.6 C) (02/10 0522) Last BM Date: 02/21/18 General:    white male in NAD Heart:  Regular rate and rhythm; no murmurs Lungs: Respirations even and unlabored, lungs CTA bilaterally Abdomen:  Soft, nontender and nondistended. Normal bowel sounds. Extremities:  Without edema. Neurologic:  Alert and oriented,  grossly normal neurologically. Psych:  Cooperative. Normal mood and affect.  Intake/Output from previous day: 02/09 0701 - 02/10 0700 In: 460 [P.O.:260; I.V.:200] Out: 870 [Urine:870]  Lab Results: Recent Labs    02/20/18 0649 02/21/18 0427 02/22/18 0400  WBC 5.1 6.0 5.4  HGB 9.1* 9.6* 9.6*  HCT 27.4* 29.3* 28.3*  PLT 190 211 206   BMET Recent Labs    02/19/18 1729 02/20/18 0649 02/21/18 0427  NA 138 138 138  K 4.1 3.7 3.5  CL 107 110 107  CO2 20* 21* 19*  GLUCOSE 122* 90 102*  BUN 25* 24* 16  CREATININE 1.37* 1.25* 1.16  CALCIUM 8.7* 8.1* 8.6*   LFT Recent Labs    02/20/18 0649  PROT 5.2*  ALBUMIN 2.9*  AST 22  ALT 24  ALKPHOS 37*  BILITOT 0.9   PT/INR Recent Labs    02/19/18 2225  LABPROT 18.8*  INR 1.60    Assessment / Plan:   Assessment: 1.  Melena: EGD and colonoscopy with no etiology, no further melena since time of colonoscopy yesterday, hemoglobin stable, Plavix and aspirin restarted 2.  Acute blood loss anemia 3.  Recent unstable angina with DES to RCA: started back on Plavix and ASA today 4.  Recent onset A. fib 5.  History of alcohol abuse  Plan: 1.  Patient's hemoglobin remained stable today, he has been started back on  his Plavix and aspirin.  Continue to monitor for decrease in hemoglobin and/or signs of acute GI bleed. 2.  Please await any further recommendations from Dr. Adela Lank today  Thank you for your kind consultation.    LOS: 3 days   Unk Lightning  02/22/2018, 10:34 AM

## 2018-02-22 NOTE — Discharge Summary (Addendum)
PATIENT DETAILS Name: Austin Malone Age: 78 y.o. Sex: male Date of Birth: 08/08/1940 MRN: [id number]. Admitting Physician: Therisa Doyne, MD VEH:MCNOB, Myrene Galas, MD  Admit Date: 02/19/2018 Discharge date: 02/22/2018  Recommendations for Outpatient Follow-up:  1. Follow up with PCP in 1-2 weeks 2. Please obtain BMP/CBC in one week 3. Eliquis on hold due to recent GI bleed-see below 4. Please ensure outpatient follow-up with cardiology   Admitted From:  Home  Disposition: Home    Home Health: No  Equipment/Devices: None  Discharge Condition: Stable  CODE STATUS: FULL CODE  Diet recommendation:  Heart Healthy   Brief Summary: See H&P, Labs, Consult and Test reports for all details in brief, Patient is a 78 y.o. 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  See below for further details.  Brief Hospital Course: Upper GI bleeding with acute blood loss anemia: Now seems to have resolved, hemoglobin stable after 2 units of PRBC transfusion.  EGD/colonoscopy negative for obvious bleeding etiology.  Aspirin/Plavix has been restarted given recent PCI, will continue to hold Eliquis.    Hemoglobin stable at 9.6 on the day of discharge.  Difficult situation-recent PCI-after extensive discussion with GI and cardiology-we plan to continue aspirin/Plavix-continue to hold Eliquis on discharge.  Patient to follow-up with outpatient MD including PCP, cardiology for further continued care.    CAD with recent PCI to [facility] with orbital atherectomy and DES and PCI to dRCA and rPL with DES on 1/20: No anginal symptoms, given GI bleeding-all antiplatelets were held-since EGD/colonoscopy negative-been restarted on aspirin and Plavix-with no further bleeding, hemoglobin  stable.  Difficult situation-spoke with cardiology on-call-Dr. Duke Salvia over the phone on 2/10-recommends that we continue with aspirin and Plavix for now-continue to hold Eliquis.  Cardiology will arrange for a quick outpatient follow-up in 1 week.  AKI: Mild AKI likely hemodynamically mediated-resolved.  PAF: Sinus rhythm on telemetry-Eliquis remains on hold due to GI bleeding requiring PRBC transfusion.  Patient on aspirin/Plavix due to recent PCI.  Fecal situation-long discussion with patient-he is aware of potential for possible embolic phenomena-but also is aware of the risks of catastrophic bleeding.  After extensive discussion with the gastroenterology service-and with cardiology (Dr. Duke Salvia over the phone)-we are planning on continuing patient on aspirin/Plavix and holding Eliquis for now.  Patient will follow-up with his outpatient primary cardiologist for further continued care.    Pericarditis: This occurred post PCI on 1/20-currently denies any chest pain-remains on colchicine.  Repeat limited echocardiogram without any significant pericardial effusion.  Chronic diastolic heart failure: Euvolemic on exam-follow volume status closely.  Hypertension: Blood pressure normally controlled-fluctuating-continue with Coreg, Imdur and follow-up with PCP for further optimization  BPH: Continue finasteride and alfuzosin  Procedures/Studies: None  Discharge Diagnoses:  Active Problems:   BPH (benign prostatic hyperplasia)   Gastroesophageal reflux disease without esophagitis   Hyperlipidemia   Hypertension   CAD (coronary artery disease)   Alcohol abuse   Chest pain at rest   Chronic diastolic CHF (congestive heart failure) (HCC)   Pericarditis   Upper GI bleed   Symptomatic anemia   AKI (acute kidney injury) [facility])   Discharge Instructions:  Activity:  As tolerated   Discharge Instructions    Diet - low sodium heart healthy   Complete  by:  As directed    Discharge  instructions   Complete by:  As directed    Follow with Primary MD  Shelbie Ammons, MD in 1 week  Please follow-up with your primary cardiologist in 1 week, office should be calling you with a follow-up appointment  Continue to hold Eliquis for now-follow-up with cardiology and your other outpatient physicians-and only resume when instructed to do so by your cardiologist.  Please get a complete blood count and chemistry panel checked by your Primary MD at your next visit, and again as instructed by your Primary MD.  Get Medicines reviewed and adjusted: Please take all your medications with you for your next visit with your Primary MD  Laboratory/radiological data: Please request your Primary MD to go over all hospital tests and procedure/radiological results at the follow up, please ask your Primary MD to get all Hospital records sent to his/her office.  In some cases, they will be blood work, cultures and biopsy results pending at the time of your discharge. Please request that your primary care M.D. follows up on these results.  Also Note the following: If you experience worsening of your admission symptoms, develop shortness of breath, life threatening emergency, suicidal or homicidal thoughts you must seek medical attention immediately by calling 911 or calling your MD immediately  if symptoms less severe.  You must read complete instructions/literature along with all the possible adverse reactions/side effects for all the Medicines you take and that have been prescribed to you. Take any new Medicines after you have completely understood and accpet all the possible adverse reactions/side effects.   Do not drive when taking Pain medications or sleeping medications (Benzodaizepines)  Do not take more than prescribed Pain, Sleep and Anxiety Medications. It is not advisable to combine anxiety,sleep and pain medications without talking with your primary care practitioner  Special  Instructions: If you have smoked or chewed Tobacco  in the last 2 yrs please stop smoking, stop any regular Alcohol  and or any Recreational drug use.  Wear Seat belts while driving.  Please note: You were cared for by a hospitalist during your hospital stay. Once you are discharged, your primary care physician will handle any further medical issues. Please note that NO REFILLS for any discharge medications will be authorized once you are discharged, as it is imperative that you return to your primary care physician (or establish a relationship with a primary care physician if you do not have one) for your post hospital discharge needs so that they can reassess your need for medications and monitor your lab values.   Increase activity slowly   Complete by:  As directed      Allergies as of 02/22/2018      Reactions   Codeine Other (See Comments)   unknown   Lisinopril Cough   Contrast Media [iodinated Diagnostic Agents] Hives, Rash      Medication List    STOP taking these medications   apixaban 5 MG Tabs tablet Commonly known as:  ELIQUIS     TAKE these medications   alfuzosin 10 MG 24 hr tablet Commonly known as:  UROXATRAL Take 1 tablet by mouth daily.   ALPRAZolam 0.5 MG tablet Commonly known as:  XANAX Take 0.5 mg by mouth 2 (two) times daily as needed for anxiety or sleep.   aspirin EC 81 MG tablet Take 81 mg by mouth daily.   bethanechol 50 MG tablet Commonly known as:  URECHOLINE Take 50  mg by mouth 3 (three) times daily.   carvedilol 6.25 MG tablet Commonly known as:  COREG Take 1 tablet by mouth 2 (two) times daily.   clopidogrel 75 MG tablet Commonly known as:  PLAVIX Take 1 tablet (75 mg total) by mouth daily.   colchicine 0.6 MG tablet Take 1 tablet (0.6 mg total) by mouth 2 (two) times daily.   dorzolamide-timolol 22.3-6.8 MG/ML ophthalmic solution Commonly known as:  COSOPT Place 1 drop into both eyes 2 (two) times daily.   ezetimibe 10 MG  tablet Commonly known as:  ZETIA Take 1 tablet (10 mg total) by mouth daily.   finasteride 5 MG tablet Commonly known as:  PROSCAR Take 1 tablet (5 mg total) by mouth daily.   hydrocortisone 1 % ointment Apply topically 2 (two) times daily. Apply to medial right malleolus in a thin layer twice daily   isosorbide mononitrate 60 MG 24 hr tablet Commonly known as:  IMDUR Take 1 tablet (60 mg total) by mouth daily.   nitroGLYCERIN 0.4 MG SL tablet Commonly known as:  NITROSTAT Place 0.4 mg under the tongue every 5 (five) minutes as needed for chest pain.   ondansetron 8 MG tablet Commonly known as:  ZOFRAN Take 8 mg by mouth 2 (two) times daily.   pantoprazole 40 MG tablet Commonly known as:  PROTONIX Take 40 mg by mouth daily.   RANEXA 1000 MG SR tablet Generic drug:  ranolazine Take 1,000 mg by mouth 2 (two) times daily.   TRAVATAN Z 0.004 % Soln ophthalmic solution Generic drug:  Travoprost (BAK Free) Place 1 drop into both eyes at bedtime.      Follow-up Information    Shelbie Ammons, MD. Schedule an appointment as soon as possible for a visit in 1 week(s).   Specialty:  Internal Medicine Contact information: [address] [place] [address] [phone]        Garwin Brothers, MD. Schedule an appointment as soon as possible for a visit in 1 week(s).   Specialty:  Cardiology Contact information: [address] [place] [address] [phone]          Allergies  Allergen Reactions  . Codeine Other (See Comments)    unknown  . Lisinopril Cough  . Contrast Media [Iodinated Diagnostic Agents] Hives and Rash    Consultations:   cardiology and GI   Other Procedures/Studies: Dg Chest 2 View  Result Date: 02/19/2018 CLINICAL DATA:  78 year old male with chest pain, nausea and vomiting. EXAM: CHEST - 2 VIEW COMPARISON:  [facility] portable chest x-ray 02/08/2018 and earlier. FINDINGS: Semi upright AP and lateral views.  Stable sequelae of CABG. Mildly lower lung volumes compared to prior two view exams. Mediastinal contours remain within normal limits. Visualized tracheal air column is within normal limits. No pneumothorax or pulmonary edema. Small chronic calcified granuloma in the right lung near the minor fissure. No pleural effusion or confluent pulmonary opacity. No acute osseous abnormality identified. Negative visible bowel gas pattern. IMPRESSION: No acute cardiopulmonary abnormality. Electronically Signed   By: Odessa Fleming M.D.   On: 02/19/2018 18:06      TODAY-DAY OF DISCHARGE:  Subjective:   Ival Bible today has no headache,no chest abdominal pain,no new weakness tingling or numbness, feels much better wants to go home today.   Objective:   Blood pressure (!) 158/67, pulse 72, temperature (!) 92.4 F (33.6 C), temperature source Oral, resp. rate 20, height 5\' 10"  (1.778 m), weight 83.5  kg, SpO2 98 %.  Intake/Output Summary (Last 24 hours) at 02/22/2018 0932 Last data filed at 02/22/2018 0523 Gross per 24 hour  Intake 260 ml  Output 870 ml  Net -610 ml   Filed Weights   02/20/18 1122 02/21/18 0756  Weight: 83.5 kg 83.5 kg    Exam: Awake Alert, Oriented *3, No new F.N deficits, Normal affect [place].AT,PERRAL Supple Neck,No JVD, No cervical lymphadenopathy appriciated.  Symmetrical Chest wall movement, Good air movement bilaterally, CTAB RRR,No Gallops,Rubs or new Murmurs, No Parasternal Heave +ve B.Sounds, Abd Soft, Non tender, No organomegaly appriciated, No rebound -guarding or rigidity. No Cyanosis, Clubbing or edema, No new Rash or bruise   PERTINENT RADIOLOGIC STUDIES: Dg Chest 2 View  Result Date: 02/19/2018 CLINICAL DATA:  78 year old male with chest pain, nausea and vomiting. EXAM: CHEST - 2 VIEW COMPARISON:  [facility] portable chest x-ray 02/08/2018 and earlier. FINDINGS: Semi upright AP and lateral views. Stable sequelae of CABG. Mildly lower lung volumes compared to  prior two view exams. Mediastinal contours remain within normal limits. Visualized tracheal air column is within normal limits. No pneumothorax or pulmonary edema. Small chronic calcified granuloma in the right lung near the minor fissure. No pleural effusion or confluent pulmonary opacity. No acute osseous abnormality identified. Negative visible bowel gas pattern. IMPRESSION: No acute cardiopulmonary abnormality. Electronically Signed   By: Odessa FlemingH  Hall M.D.   On: 02/19/2018 18:06     PERTINENT LAB RESULTS: CBC: Recent Labs    02/21/18 0427 02/22/18 0400  WBC 6.0 5.4  HGB 9.6* 9.6*  HCT 29.3* 28.3*  PLT 211 206   CMET CMP     Component Value Date/Time   NA 138 02/21/2018 0427   NA 132 (L) 09/18/2017 1435   K 3.5 02/21/2018 0427   CL 107 02/21/2018 0427   CO2 19 (L) 02/21/2018 0427   GLUCOSE 102 (H) 02/21/2018 0427   BUN 16 02/21/2018 0427   BUN 10 09/18/2017 1435   CREATININE 1.16 02/21/2018 0427   CALCIUM 8.6 (L) 02/21/2018 0427   PROT 5.2 (L) 02/20/2018 0649   PROT 6.5 09/18/2017 1440   ALBUMIN 2.9 (L) 02/20/2018 0649   ALBUMIN 4.1 09/18/2017 1440   AST 22 02/20/2018 0649   ALT 24 02/20/2018 0649   ALKPHOS 37 (L) 02/20/2018 0649   BILITOT 0.9 02/20/2018 0649   BILITOT 0.3 09/18/2017 1440   GFRNONAA 60 (L) 02/21/2018 0427   GFRAA >60 02/21/2018 0427    GFR Estimated Creatinine Clearance: 53.3 mL/min (by C-G formula based on SCr of 1.16 mg/dL). No results for input(s): LIPASE, AMYLASE in the last 72 hours. Recent Labs    02/19/18 2225 02/20/18 0649 02/20/18 1259  TROPONINI <0.03 <0.03 <0.03   Invalid input(s): POCBNP No results for input(s): DDIMER in the last 72 hours. Recent Labs    02/20/18 0649  HGBA1C 5.8*   No results for input(s): CHOL, HDL, LDLCALC, TRIG, CHOLHDL, LDLDIRECT in the last 72 hours. Recent Labs    02/20/18 0649  TSH 1.058   No results for input(s): VITAMINB12, FOLATE, FERRITIN, TIBC, IRON, RETICCTPCT in the last 72  hours. Coags: Recent Labs    02/19/18 2225  INR 1.60   Microbiology: Recent Results (from the past 240 hour(s))  MRSA PCR Screening     Status: None   Collection Time: 02/20/18  3:44 AM  Result Value Ref Range Status   MRSA by PCR NEGATIVE NEGATIVE Final    Comment:  The GeneXpert MRSA Assay (FDA approved for NASAL specimens only), is one component of a comprehensive MRSA colonization surveillance program. It is not intended to diagnose MRSA infection nor to guide or monitor treatment for MRSA infections. Performed at [facility], [address] [place], [place] [id number]     FURTHER DISCHARGE INSTRUCTIONS:  Get Medicines reviewed and adjusted: Please take all your medications with you for your next visit with your Primary MD  Laboratory/radiological data: Please request your Primary MD to go over all hospital tests and procedure/radiological results at the follow up, please ask your Primary MD to get all Hospital records sent to his/her office.  In some cases, they will be blood work, cultures and biopsy results pending at the time of your discharge. Please request that your primary care M.D. goes through all the records of your hospital data and follows up on these results.  Also Note the following: If you experience worsening of your admission symptoms, develop shortness of breath, life threatening emergency, suicidal or homicidal thoughts you must seek medical attention immediately by calling 911 or calling your MD immediately  if symptoms less severe.  You must read complete instructions/literature along with all the possible adverse reactions/side effects for all the Medicines you take and that have been prescribed to you. Take any new Medicines after you have completely understood and accpet all the possible adverse reactions/side effects.   Do not drive when taking Pain medications or sleeping medications (Benzodaizepines)  Do not take more than  prescribed Pain, Sleep and Anxiety Medications. It is not advisable to combine anxiety,sleep and pain medications without talking with your primary care practitioner  Special Instructions: If you have smoked or chewed Tobacco  in the last 2 yrs please stop smoking, stop any regular Alcohol  and or any Recreational drug use.  Wear Seat belts while driving.  Please note: You were cared for by a hospitalist during your hospital stay. Once you are discharged, your primary care physician will handle any further medical issues. Please note that NO REFILLS for any discharge medications will be authorized once you are discharged, as it is imperative that you return to your primary care physician (or establish a relationship with a primary care physician if you do not have one) for your post hospital discharge needs so that they can reassess your need for medications and monitor your lab values.  Total Time spent coordinating discharge including counseling, education and face to face time equals 35 minutes.  SignedJeoffrey Massed: Terrilynn Postell 02/22/2018 9:32 AM

## 2018-02-22 NOTE — Progress Notes (Signed)
CSW consulted regarding transportation needs. Patient has no money and no one to come pick him up. [place]. CSW provided taxi voucher.  Osborne Cascoadia Loretto Belinsky LCSW [phone]

## 2018-02-23 ENCOUNTER — Encounter (HOSPITAL_COMMUNITY): Payer: Self-pay | Admitting: Gastroenterology

## 2018-02-23 LAB — BPAM RBC
Blood Product Expiration Date: 202003062359
Blood Product Expiration Date: 202003062359
Blood Product Expiration Date: 202003072359
ISSUE DATE / TIME: 202002072236
ISSUE DATE / TIME: 202002080154
Unit Type and Rh: 6200
Unit Type and Rh: 6200
Unit Type and Rh: 6200

## 2018-02-23 LAB — TYPE AND SCREEN
ABO/RH(D): A POS
Antibody Screen: NEGATIVE
Unit division: 0
Unit division: 0
Unit division: 0

## 2018-02-23 NOTE — Care Management (Signed)
02/23/2018  Per [facility], pt active with HH.Isidoro Donning RN CCM Case Mgmt phone [phone]

## 2018-02-25 ENCOUNTER — Ambulatory Visit: Payer: Medicare HMO | Admitting: Cardiology

## 2018-03-02 ENCOUNTER — Encounter: Payer: Self-pay | Admitting: Cardiology

## 2018-03-02 ENCOUNTER — Ambulatory Visit (INDEPENDENT_AMBULATORY_CARE_PROVIDER_SITE_OTHER): Payer: Medicare HMO | Admitting: Cardiology

## 2018-03-02 VITALS — BP 112/60 | HR 62 | Ht 70.0 in | Wt 187.0 lb

## 2018-03-02 DIAGNOSIS — I2510 Atherosclerotic heart disease of native coronary artery without angina pectoris: Secondary | ICD-10-CM | POA: Diagnosis not present

## 2018-03-02 NOTE — Progress Notes (Signed)
Cardiology Office Note:    Date:  03/02/2018   ID:  Austin Malone, DOB 15-Feb-1940, MRN [id number]  PCP:  Shelbie AmmonsHaque, Imran P, MD  Cardiologist:  Garwin Brothersajan R Revankar, MD   Referring MD: Shelbie AmmonsHaque, Imran P, MD    ASSESSMENT:    1. Coronary artery disease involving native coronary artery of native heart without angina pectoris    PLAN:    In order of problems listed above:  1. I discussed my findings with the patient at extensive length and secondary prevention stressed with the patient.  Importance of compliance with diet and medication stressed and he vocalized understanding.  His blood pressure was reviewed and is stable today. 2. I reviewed his lab work from recent past and his lipids are fine.  He is hemoglobin and blood work for GI blood loss will be followed by his primary care physician.  He has an appointment coming with him in the next few days.  In view of dual antiplatelet therapy I told the patient to be watchful of any suggestion of bleeding from his stools or any such issues and he understands. 3. 4 months follow-up appointment or earlier if he has any concerns.   Medication Adjustments/Labs and Tests Ordered: Current medicines are reviewed at length with the patient today.  Concerns regarding medicines are outlined above.  No orders of the defined types were placed in this encounter.  No orders of the defined types were placed in this encounter.    No chief complaint on file.    History of Present Illness:    Austin Malone is a 78 y.o. male.  Patient has history of coronary artery disease.  [facility] and [place] coronary intervention.  Subsequently he had GI bleed and underwent transfusion.  He is done fine since.  No chest pain orthopnea or PND.  He ambulates these appropriately.  At the time of my evaluation, the patient is alert awake oriented and in no distress.  Past Medical History:  Diagnosis Date  . Anxiety   . Arthritis   . BPH (benign  prostatic hyperplasia)   . CAD (coronary artery disease)   . Cataracts, bilateral   . Enlarged prostate   . GERD (gastroesophageal reflux disease)   . Glaucoma   . Hyperlipidemia   . Hypertension   . Non-ST elevation (NSTEMI) myocardial infarction (HCC) 08/21/2017  . Pneumonia   . PUD (peptic ulcer disease)   . Rotator cuff tear     Past Surgical History:  Procedure Laterality Date  . APPENDECTOMY    . COLONOSCOPY WITH PROPOFOL N/A 02/21/2018   Procedure: COLONOSCOPY WITH PROPOFOL;  Surgeon: Sherrilyn Ristanis, Henry L III, MD;  [facility];  Service: Gastroenterology;  Laterality: N/A;  . CORONARY ANGIOPLASTY WITH STENT PLACEMENT  11/2014   PCI of SVG to PDA x 2 with Xience DES to proximal stenosis, Ultra 5.0 BMS to distal thrombotic lesion  . CORONARY ARTERY BYPASS GRAFT  2008  . CORONARY ATHERECTOMY N/A 02/01/2018   Procedure: CORONARY ATHERECTOMY;  Surgeon: Yvonne KendallEnd, Christopher, MD;  [facility];  Service: Cardiovascular;  Laterality: N/A;  . CORONARY STENT INTERVENTION N/A 02/01/2018   Procedure: CORONARY STENT INTERVENTION;  Surgeon: Yvonne KendallEnd, Christopher, MD;  [facility];  Service: Cardiovascular;  Laterality: N/A;  . ESOPHAGOGASTRODUODENOSCOPY (EGD) WITH PROPOFOL N/A 02/20/2018   Procedure: ESOPHAGOGASTRODUODENOSCOPY (EGD) WITH PROPOFOL;  Surgeon: Sherrilyn Ristanis, Henry L III, MD;  [facility];  Service: Gastroenterology;  Laterality: N/A;  .  EYE SURGERY    . INTRAVASCULAR ULTRASOUND/IVUS N/A 02/01/2018   Procedure: Intravascular Ultrasound/IVUS;  Surgeon: Yvonne Kendall, MD;  [facility];  Service: Cardiovascular;  Laterality: N/A;  . LEFT HEART CATH AND CORS/GRAFTS ANGIOGRAPHY N/A 08/21/2017   Procedure: LEFT HEART CATH AND CORS/GRAFTS ANGIOGRAPHY;  Surgeon: Yvonne Kendall, MD;  [facility];  Service: Cardiovascular;  Laterality: N/A;  . LEFT HEART CATH AND CORS/GRAFTS ANGIOGRAPHY N/A 02/01/2018   Procedure: LEFT HEART  CATH AND CORS/GRAFTS ANGIOGRAPHY;  Surgeon: Yvonne Kendall, MD;  [facility];  Service: Cardiovascular;  Laterality: N/A;  . LEG SURGERY    . TONSILLECTOMY      Current Medications: Current Meds  Medication Sig  . alfuzosin (UROXATRAL) 10 MG 24 hr tablet Take 1 tablet by mouth daily.  Marland Kitchen ALPRAZolam (XANAX) 0.5 MG tablet Take 0.5 mg by mouth 2 (two) times daily as needed for anxiety or sleep.   Marland Kitchen aspirin EC 81 MG tablet Take 81 mg by mouth daily.  . bethanechol (URECHOLINE) 50 MG tablet Take 50 mg by mouth 3 (three) times daily.  . carvedilol (COREG) 6.25 MG tablet Take 1 tablet by mouth 2 (two) times daily.  . clopidogrel (PLAVIX) 75 MG tablet Take 1 tablet (75 mg total) by mouth daily.  . colchicine 0.6 MG tablet Take 1 tablet (0.6 mg total) by mouth 2 (two) times daily.  . dorzolamide-timolol (COSOPT) 22.3-6.8 MG/ML ophthalmic solution Place 1 drop into both eyes 2 (two) times daily.  Marland Kitchen ezetimibe (ZETIA) 10 MG tablet Take 1 tablet (10 mg total) by mouth daily.  . finasteride (PROSCAR) 5 MG tablet Take 1 tablet (5 mg total) by mouth daily.  . hydrocortisone 1 % ointment Apply topically 2 (two) times daily. Apply to medial right malleolus in a thin layer twice daily  . isosorbide mononitrate (IMDUR) 60 MG 24 hr tablet Take 1 tablet (60 mg total) by mouth daily.  . nitroGLYCERIN (NITROSTAT) 0.4 MG SL tablet Place 0.4 mg under the tongue every 5 (five) minutes as needed for chest pain.   Marland Kitchen ondansetron (ZOFRAN) 8 MG tablet Take 8 mg by mouth 2 (two) times daily.   . pantoprazole (PROTONIX) 40 MG tablet Take 40 mg by mouth daily.   . ranolazine (RANEXA) 1000 MG SR tablet Take 1,000 mg by mouth 2 (two) times daily.   . TRAVATAN Z 0.004 % SOLN ophthalmic solution Place 1 drop into both eyes at bedtime.      Allergies:   Codeine; Lisinopril; and Contrast media [iodinated diagnostic agents]   Social History   Socioeconomic History  . Marital status: Married    Spouse name:  Not on file  . Number of children: Not on file  . Years of education: Not on file  . Highest education level: Not on file  Occupational History  . Occupation: Retired  Engineer, production  . Financial resource strain: Not on file  . Food insecurity:    Worry: Not on file    Inability: Not on file  . Transportation needs:    Medical: Not on file    Non-medical: Not on file  Tobacco Use  . Smoking status: Former Games developer  . Smokeless tobacco: Never Used  Substance and Sexual Activity  . Alcohol use: Yes    Alcohol/week: 21.0 standard drinks    Types: 21 Cans of beer per week  . Drug use: Yes    Types: Marijuana  . Sexual activity: Not on file  Lifestyle  . Physical activity:    Days per week: Not on file    Minutes per session: Not on file  . Stress: Not on file  Relationships  . Social connections:    Talks on phone: Not on file    Gets together: Not on file    Attends religious service: Not on file    Active member of club or organization: Not on file    Attends meetings of clubs or organizations: Not on file    Relationship status: Not on file  Other Topics Concern  . Not on file  Social History Narrative   He lives in [place], [place] alone.     Family History: The patient's family history includes Diabetes in his mother; Heart attack in his brother.  ROS:   Please see the history of present illness.    All other systems reviewed and are negative.  EKGs/Labs/Other Studies Reviewed:    The following studies were reviewed today:   [facility]  CARDIAC CATHETERIZATION  Order# [id number]  Reading physician: Yvonne Kendall, MD Ordering physician: Yvonne Kendall, MD Study date: 02/01/18  Physicians   Panel Physicians Referring Physician Case Authorizing Physician  End, Cristal Deer, MD (Primary)    Procedures   CORONARY STENT INTERVENTION  CORONARY ATHERECTOMY  Intravascular Ultrasound/IVUS  LEFT HEART CATH AND CORS/GRAFTS ANGIOGRAPHY  Conclusion     Conclusions: 4. Severe three-vessel native coronary artery disease; mid RCA stenosis is more severe (90-95%) than in 08/2017. Otherwise, no significant change is noted. 5. Patent SVG-rPDA with stable mild to moderate disease. 6. Patient LIMA-LAD. 7. Known occlusions of SVG-D and SVG-OM; grafts were not engaged on today's study. 8. Successful PCI to mid RCA using orbital atherectomy and Synergy 3.5 x 38 mm drug-eluting stent with 0% residual stenosis and TIMI-3 flow 9. Successful PCI to distal RCA and rPL using Synergy 2.75 x 24 mm drug-eluting stent with 0% residual stenosis and TIMI-3 flow. 10. Successful placement of temporary transvenous pacemaker via the right femoral vein.  Recommendations: 1. Dual antiplatelet therapy with aspirin and clopidogrel for at least 12 months, ideally longer. 2. Aggressive secondary prevention. 3. Remove right femoral vein sheath when ACT < 175 seconds.  Yvonne Kendall, MD [facility] Pager: [phone]    Recent Labs: 02/03/2018: B Natriuretic Peptide 369.7 02/20/2018: ALT 24; Magnesium 1.9; TSH 1.058 02/21/2018: BUN 16; Creatinine, Ser 1.16; Potassium 3.5; Sodium 138 02/22/2018: Hemoglobin 9.6; Platelets 206  Recent Lipid Panel    Component Value Date/Time   CHOL 176 02/04/2018 0230   CHOL 184 09/18/2017 1442   TRIG 96 02/04/2018 0230   HDL 60 02/04/2018 0230   HDL 71 09/18/2017 1442   CHOLHDL 2.9 02/04/2018 0230   VLDL 19 02/04/2018 0230   LDLCALC 97 02/04/2018 0230   LDLCALC 92 09/18/2017 1442    Physical Exam:    VS:  BP 112/60 (BP Location: Right Arm, Patient Position: Sitting, Cuff Size: Normal)   Pulse 62   Ht 5\' 10"  (1.778 m)   Wt 187 lb (84.8 kg)   SpO2 98%   BMI 26.83 kg/m     Wt Readings from Last 3 Encounters:  03/02/18 187 lb (84.8 kg)  02/21/18 184 lb 1.4 oz (83.5 kg)  02/06/18 184 lb 1.6 oz (83.5 kg)     GEN: Patient is in no acute distress HEENT: Normal NECK: No JVD; No carotid bruits LYMPHATICS: No  lymphadenopathy CARDIAC: Hear sounds regular, 2/6 systolic murmur at the apex. RESPIRATORY:  Clear  to auscultation without rales, wheezing or rhonchi  ABDOMEN: Soft, non-tender, non-distended MUSCULOSKELETAL:  No edema; No deformity  SKIN: Warm and dry NEUROLOGIC:  Alert and oriented x 3 PSYCHIATRIC:  Normal affect   Signed, Garwin Brothers, MD  03/02/2018 3:23 PM     [facility]

## 2018-03-02 NOTE — Patient Instructions (Signed)
Medication Instructions:  Your physician recommends that you continue on your current medications as directed. Please refer to the Current Medication list given to you today.  If you need a refill on your cardiac medications before your next appointment, please call your pharmacy.   Lab work: None  If you have labs (blood work) drawn today and your tests are completely normal, you will receive your results only by: . MyChart Message (if you have MyChart) OR . A paper copy in the mail If you have any lab test that is abnormal or we need to change your treatment, we will call you to review the results.  Testing/Procedures: None  Follow-Up: At [facility], you and your health needs are our priority.  As part of our continuing mission to provide you with exceptional heart care, we have created designated Provider Care Teams.  These Care Teams include your primary Cardiologist (physician) and Advanced Practice Providers (APPs -  Physician Assistants and Nurse Practitioners) who all work together to provide you with the care you need, when you need it.  You will need a follow up appointment in 4 months.  Please call our office 2 months in advance to schedule this appointment.  You may see Rajan R Revankar, MD or another member of our [facility] Provider Team in [place]: Robert Krasowski, MD . Brian Munley, MD  Any Other Special Instructions Will Be Listed Below (If Applicable).    

## 2018-03-17 ENCOUNTER — Telehealth: Payer: Self-pay | Admitting: Cardiology

## 2018-03-17 DIAGNOSIS — R079 Chest pain, unspecified: Secondary | ICD-10-CM

## 2018-03-17 NOTE — Telephone Encounter (Signed)
Had so many CP's last night her took over a dozen nitroglycerin-and wants to know what he should do now so he don't have CP all night again

## 2018-03-17 NOTE — Telephone Encounter (Signed)
Left message for patient to return call. Advised him to go to the emergency department if having active chest pain.

## 2018-03-18 ENCOUNTER — Other Ambulatory Visit: Payer: Self-pay | Admitting: Emergency Medicine

## 2018-03-18 ENCOUNTER — Telehealth: Payer: Self-pay

## 2018-03-18 MED ORDER — NITROGLYCERIN 0.4 MG SL SUBL: 0.4000 mg | SUBLINGUAL_TABLET | SUBLINGUAL | 3 refills | Status: DC | PRN

## 2018-03-18 NOTE — Telephone Encounter (Signed)
Per Dr. Tomie China patient informed to have labs drawn at [facility] asap. Patient verbally understands he reports he may not go until this afternoon because his morning is full, I did express urgency and advised him to go as soon as he could per Dr. Tomie China. He understands to wait there until we call him with results, he provided his cell phone number. This is a stat lab and is being faxed to [facility]. Patient verbally understood.

## 2018-03-18 NOTE — Telephone Encounter (Signed)
Called patient he reports last night went much better for him. He only had chest pain once, it went away and he slept well. The night before he had multiple instances of chest pain that was centralized in his chest and radiated to his neck, and both arms. During this he took nitroglycerin and it relieved his pain but it would come back 30 minutes after taking the nitroglycerin. As stated last night went much better for him, he denies any chest pain currently. I advised him if this were to return he would need to go to the emergency department he verbally understands. I will route to Dr. Tomie China for any other recommendations as well. Patient understands all information.

## 2018-03-18 NOTE — Telephone Encounter (Signed)
Please have him go to [facility] now asap and go to the lab and get a troponin level.  Have him wait there while he gets the results.  We will then decide accordingly.

## 2018-03-18 NOTE — Telephone Encounter (Signed)
Patient informed of negative troponin results and scheduled to see Dr. Tomie China next week, in the meantime he was advised to go to the emergency department if he has anymore chest pain. Patient verbally understands.

## 2018-03-18 NOTE — Telephone Encounter (Signed)
Called patient and left detailed voice message on patients phone regarding lab results. 

## 2018-03-18 NOTE — Telephone Encounter (Signed)
-----   Message from Rajan R Revankar, MD sent at 03/18/2018  1:04 PM EST ----- The results of the study is unremarkable. Please inform patient. I will discuss in detail at next appointment. Cc  primary care/referring physician Rajan R Revankar, MD 03/18/2018 1:04 PM 

## 2018-03-18 NOTE — Addendum Note (Signed)
Addended by: Lita Mains on: 03/18/2018 12:46 PM   Modules accepted: Orders

## 2018-03-24 ENCOUNTER — Encounter: Payer: Self-pay | Admitting: Cardiology

## 2018-03-24 ENCOUNTER — Other Ambulatory Visit: Payer: Self-pay

## 2018-03-24 ENCOUNTER — Ambulatory Visit (INDEPENDENT_AMBULATORY_CARE_PROVIDER_SITE_OTHER): Payer: Medicare HMO | Admitting: Cardiology

## 2018-03-24 VITALS — BP 116/68 | HR 63 | Ht 70.0 in | Wt 189.0 lb

## 2018-03-24 DIAGNOSIS — E782 Mixed hyperlipidemia: Secondary | ICD-10-CM | POA: Diagnosis not present

## 2018-03-24 DIAGNOSIS — I10 Essential (primary) hypertension: Secondary | ICD-10-CM

## 2018-03-24 DIAGNOSIS — I2510 Atherosclerotic heart disease of native coronary artery without angina pectoris: Secondary | ICD-10-CM | POA: Diagnosis not present

## 2018-03-24 HISTORY — DX: Essential (primary) hypertension: I10

## 2018-03-24 NOTE — Progress Notes (Signed)
Cardiology Office Note:    Date:  03/24/2018   ID:  Austin Malone, DOB 1940-10-16, MRN [id number]  PCP:  Shelbie AmmonsHaque, Imran P, MD  Cardiologist:  Garwin Brothersajan R Kareli Hossain, MD   Referring MD: Shelbie AmmonsHaque, Imran P, MD    ASSESSMENT:    1. Coronary artery disease involving native coronary artery of native heart without angina pectoris   2. Mixed hyperlipidemia   3. Essential hypertension    PLAN:    In order of problems listed above:  1. Secondary prevention stressed with the patient.  Importance of compliance with diet and medication stressed and he vocalized understanding.  His blood pressure is stable.  Diet was discussed for dyslipidemia.  He has an excellent effort tolerance now for his age and I congratulated him about his consistent exercise. 2. Lipids are followed by his primary care physician so he is his anemia. 3. Patient will be seen in follow-up appointment in 4 months or earlier if the patient has any concerns    Medication Adjustments/Labs and Tests Ordered: Current medicines are reviewed at length with the patient today.  Concerns regarding medicines are outlined above.  No orders of the defined types were placed in this encounter.  No orders of the defined types were placed in this encounter.    No chief complaint on file.    History of Present Illness:    Austin Malone is a 78 y.o. male.  Patient has known coronary artery disease.  He denies any problems at this time and takes care of activities of daily living.  No chest pain orthopnea or PND.  He exercises on a regular basis.  He tells me that he walks 30 minutes now without any symptoms and is very happy about it.  Recently his doctor does his blood work and he is waiting to hear from them.  Past Medical History:  Diagnosis Date  . Anxiety   . Arthritis   . BPH (benign prostatic hyperplasia)   . CAD (coronary artery disease)   . Cataracts, bilateral   . Enlarged prostate   . GERD (gastroesophageal reflux disease)    . Glaucoma   . Hyperlipidemia   . Hypertension   . Non-ST elevation (NSTEMI) myocardial infarction (HCC) 08/21/2017  . Pneumonia   . PUD (peptic ulcer disease)   . Rotator cuff tear     Past Surgical History:  Procedure Laterality Date  . APPENDECTOMY    . COLONOSCOPY WITH PROPOFOL N/A 02/21/2018   Procedure: COLONOSCOPY WITH PROPOFOL;  Surgeon: Sherrilyn Ristanis, Henry L III, MD;  [facility];  Service: Gastroenterology;  Laterality: N/A;  . CORONARY ANGIOPLASTY WITH STENT PLACEMENT  11/2014   PCI of SVG to PDA x 2 with Xience DES to proximal stenosis, Ultra 5.0 BMS to distal thrombotic lesion  . CORONARY ARTERY BYPASS GRAFT  2008  . CORONARY ATHERECTOMY N/A 02/01/2018   Procedure: CORONARY ATHERECTOMY;  Surgeon: Yvonne KendallEnd, Christopher, MD;  [facility];  Service: Cardiovascular;  Laterality: N/A;  . CORONARY STENT INTERVENTION N/A 02/01/2018   Procedure: CORONARY STENT INTERVENTION;  Surgeon: Yvonne KendallEnd, Christopher, MD;  [facility];  Service: Cardiovascular;  Laterality: N/A;  . ESOPHAGOGASTRODUODENOSCOPY (EGD) WITH PROPOFOL N/A 02/20/2018   Procedure: ESOPHAGOGASTRODUODENOSCOPY (EGD) WITH PROPOFOL;  Surgeon: Sherrilyn Ristanis, Henry L III, MD;  [facility];  Service: Gastroenterology;  Laterality: N/A;  . EYE SURGERY    . INTRAVASCULAR ULTRASOUND/IVUS N/A 02/01/2018   Procedure: Intravascular Ultrasound/IVUS;  Surgeon: Yvonne KendallEnd, Christopher, MD;  [facility] CV LAB;  Service: Cardiovascular;  Laterality: N/A;  . LEFT HEART CATH AND CORS/GRAFTS ANGIOGRAPHY N/A 08/21/2017   Procedure: LEFT HEART CATH AND CORS/GRAFTS ANGIOGRAPHY;  Surgeon: Yvonne Kendall, MD;  [facility];  Service: Cardiovascular;  Laterality: N/A;  . LEFT HEART CATH AND CORS/GRAFTS ANGIOGRAPHY N/A 02/01/2018   Procedure: LEFT HEART CATH AND CORS/GRAFTS ANGIOGRAPHY;  Surgeon: Yvonne Kendall, MD;  [facility];  Service: Cardiovascular;  Laterality: N/A;  . LEG SURGERY     . TONSILLECTOMY      Current Medications: Current Meds  Medication Sig  . alfuzosin (UROXATRAL) 10 MG 24 hr tablet Take 1 tablet by mouth daily.  Marland Kitchen ALPRAZolam (XANAX) 0.5 MG tablet Take 0.5 mg by mouth 2 (two) times daily as needed for anxiety or sleep.   Marland Kitchen aspirin EC 81 MG tablet Take 81 mg by mouth daily.  . bethanechol (URECHOLINE) 50 MG tablet Take 50 mg by mouth 3 (three) times daily.  . carvedilol (COREG) 6.25 MG tablet Take 1 tablet by mouth 2 (two) times daily.  . clopidogrel (PLAVIX) 75 MG tablet Take 1 tablet (75 mg total) by mouth daily.  . dorzolamide-timolol (COSOPT) 22.3-6.8 MG/ML ophthalmic solution Place 1 drop into both eyes 2 (two) times daily.  Marland Kitchen ezetimibe (ZETIA) 10 MG tablet Take 1 tablet (10 mg total) by mouth daily.  . finasteride (PROSCAR) 5 MG tablet Take 1 tablet (5 mg total) by mouth daily.  . hydrocortisone 1 % ointment Apply topically 2 (two) times daily. Apply to medial right malleolus in a thin layer twice daily  . isosorbide mononitrate (IMDUR) 60 MG 24 hr tablet Take 1 tablet (60 mg total) by mouth daily.  . nitroGLYCERIN (NITROSTAT) 0.4 MG SL tablet Place 1 tablet (0.4 mg total) under the tongue every 5 (five) minutes as needed for chest pain.  Marland Kitchen ondansetron (ZOFRAN) 8 MG tablet Take 8 mg by mouth 2 (two) times daily.   . pantoprazole (PROTONIX) 40 MG tablet Take 40 mg by mouth daily.   . ranolazine (RANEXA) 1000 MG SR tablet Take 1,000 mg by mouth 2 (two) times daily.   . TRAVATAN Z 0.004 % SOLN ophthalmic solution Place 1 drop into both eyes at bedtime.      Allergies:   Codeine; Lisinopril; and Contrast media [iodinated diagnostic agents]   Social History   Socioeconomic History  . Marital status: Married    Spouse name: Not on file  . Number of children: Not on file  . Years of education: Not on file  . Highest education level: Not on file  Occupational History  . Occupation: Retired  Engineer, production  . Financial resource strain: Not on file   . Food insecurity:    Worry: Not on file    Inability: Not on file  . Transportation needs:    Medical: Not on file    Non-medical: Not on file  Tobacco Use  . Smoking status: Former Games developer  . Smokeless tobacco: Never Used  Substance and Sexual Activity  . Alcohol use: Yes    Alcohol/week: 21.0 standard drinks    Types: 21 Cans of beer per week  . Drug use: Yes    Types: Marijuana  . Sexual activity: Not on file  Lifestyle  . Physical activity:    Days per week: Not on file    Minutes per session: Not on file  . Stress: Not on file  Relationships  . Social connections:  Talks on phone: Not on file    Gets together: Not on file    Attends religious service: Not on file    Active member of club or organization: Not on file    Attends meetings of clubs or organizations: Not on file    Relationship status: Not on file  Other Topics Concern  . Not on file  Social History Narrative   He lives in [place], [place] alone.     Family History: The patient's family history includes Diabetes in his mother; Heart attack in his brother.  ROS:   Please see the history of present illness.    All other systems reviewed and are negative.  EKGs/Labs/Other Studies Reviewed:    The following studies were reviewed today: I discussed my findings with the patient at extensive length.   Recent Labs: 02/03/2018: B Natriuretic Peptide 369.7 02/20/2018: ALT 24; Magnesium 1.9; TSH 1.058 02/21/2018: BUN 16; Creatinine, Ser 1.16; Potassium 3.5; Sodium 138 02/22/2018: Hemoglobin 9.6; Platelets 206  Recent Lipid Panel    Component Value Date/Time   CHOL 176 02/04/2018 0230   CHOL 184 09/18/2017 1442   TRIG 96 02/04/2018 0230   HDL 60 02/04/2018 0230   HDL 71 09/18/2017 1442   CHOLHDL 2.9 02/04/2018 0230   VLDL 19 02/04/2018 0230   LDLCALC 97 02/04/2018 0230   LDLCALC 92 09/18/2017 1442    Physical Exam:    VS:  BP 116/68 (BP Location: Right Arm, Patient Position: Sitting,  Cuff Size: Normal)   Pulse 63   Ht 5\' 10"  (1.778 m)   Wt 189 lb (85.7 kg)   SpO2 98%   BMI 27.12 kg/m     Wt Readings from Last 3 Encounters:  03/24/18 189 lb (85.7 kg)  03/02/18 187 lb (84.8 kg)  02/21/18 184 lb 1.4 oz (83.5 kg)     GEN: Patient is in no acute distress HEENT: Normal NECK: No JVD; No carotid bruits LYMPHATICS: No lymphadenopathy CARDIAC: Hear sounds regular, 2/6 systolic murmur at the apex. RESPIRATORY:  Clear to auscultation without rales, wheezing or rhonchi  ABDOMEN: Soft, non-tender, non-distended MUSCULOSKELETAL:  No edema; No deformity  SKIN: Warm and dry NEUROLOGIC:  Alert and oriented x 3 PSYCHIATRIC:  Normal affect   Signed, Garwin Brothers, MD  03/24/2018 4:24 PM    [facility]

## 2018-03-24 NOTE — Patient Instructions (Signed)
Medication Instructions:  Your physician recommends that you continue on your current medications as directed. Please refer to the Current Medication list given to you today.  If you need a refill on your cardiac medications before your next appointment, please call your pharmacy.   Lab work: None  If you have labs (blood work) drawn today and your tests are completely normal, you will receive your results only by: . MyChart Message (if you have MyChart) OR . A paper copy in the mail If you have any lab test that is abnormal or we need to change your treatment, we will call you to review the results.  Testing/Procedures: None  Follow-Up: At [facility], you and your health needs are our priority.  As part of our continuing mission to provide you with exceptional heart care, we have created designated Provider Care Teams.  These Care Teams include your primary Cardiologist (physician) and Advanced Practice Providers (APPs -  Physician Assistants and Nurse Practitioners) who all work together to provide you with the care you need, when you need it.  You will need a follow up appointment in 4 months.  Please call our office 2 months in advance to schedule this appointment.  You may see Rajan R Revankar, MD or another member of our [facility] Provider Team in [place]: Robert Krasowski, MD . Brian Munley, MD  Any Other Special Instructions Will Be Listed Below (If Applicable).    

## 2018-05-23 ENCOUNTER — Encounter (HOSPITAL_COMMUNITY): Payer: Self-pay | Admitting: Emergency Medicine

## 2018-05-23 ENCOUNTER — Emergency Department (HOSPITAL_COMMUNITY): Payer: Medicare HMO

## 2018-05-23 ENCOUNTER — Observation Stay (HOSPITAL_COMMUNITY)
Admission: EM | Admit: 2018-05-23 | Discharge: 2018-05-24 | Disposition: A | Discharge status: Home / Self Care | Payer: Medicare HMO | Attending: Cardiology | Admitting: Cardiology

## 2018-05-23 DIAGNOSIS — I5033 Acute on chronic diastolic (congestive) heart failure: Secondary | ICD-10-CM | POA: Insufficient documentation

## 2018-05-23 DIAGNOSIS — Z951 Presence of aortocoronary bypass graft: Secondary | ICD-10-CM | POA: Insufficient documentation

## 2018-05-23 DIAGNOSIS — F419 Anxiety disorder, unspecified: Secondary | ICD-10-CM | POA: Insufficient documentation

## 2018-05-23 DIAGNOSIS — I252 Old myocardial infarction: Secondary | ICD-10-CM | POA: Diagnosis not present

## 2018-05-23 DIAGNOSIS — D649 Anemia, unspecified: Secondary | ICD-10-CM | POA: Diagnosis not present

## 2018-05-23 DIAGNOSIS — I313 Pericardial effusion (noninflammatory): Secondary | ICD-10-CM | POA: Diagnosis not present

## 2018-05-23 DIAGNOSIS — E785 Hyperlipidemia, unspecified: Secondary | ICD-10-CM | POA: Insufficient documentation

## 2018-05-23 DIAGNOSIS — I25110 Atherosclerotic heart disease of native coronary artery with unstable angina pectoris: Secondary | ICD-10-CM | POA: Diagnosis not present

## 2018-05-23 DIAGNOSIS — K219 Gastro-esophageal reflux disease without esophagitis: Secondary | ICD-10-CM | POA: Diagnosis not present

## 2018-05-23 DIAGNOSIS — K589 Irritable bowel syndrome without diarrhea: Secondary | ICD-10-CM | POA: Insufficient documentation

## 2018-05-23 DIAGNOSIS — Z91041 Radiographic dye allergy status: Secondary | ICD-10-CM | POA: Diagnosis not present

## 2018-05-23 DIAGNOSIS — Z7982 Long term (current) use of aspirin: Secondary | ICD-10-CM | POA: Diagnosis not present

## 2018-05-23 DIAGNOSIS — N179 Acute kidney failure, unspecified: Secondary | ICD-10-CM | POA: Insufficient documentation

## 2018-05-23 DIAGNOSIS — Z833 Family history of diabetes mellitus: Secondary | ICD-10-CM | POA: Insufficient documentation

## 2018-05-23 DIAGNOSIS — H409 Unspecified glaucoma: Secondary | ICD-10-CM | POA: Insufficient documentation

## 2018-05-23 DIAGNOSIS — Z955 Presence of coronary angioplasty implant and graft: Secondary | ICD-10-CM | POA: Insufficient documentation

## 2018-05-23 DIAGNOSIS — Z1159 Encounter for screening for other viral diseases: Secondary | ICD-10-CM | POA: Diagnosis not present

## 2018-05-23 DIAGNOSIS — Z8711 Personal history of peptic ulcer disease: Secondary | ICD-10-CM | POA: Insufficient documentation

## 2018-05-23 DIAGNOSIS — M1990 Unspecified osteoarthritis, unspecified site: Secondary | ICD-10-CM | POA: Diagnosis not present

## 2018-05-23 DIAGNOSIS — N400 Enlarged prostate without lower urinary tract symptoms: Secondary | ICD-10-CM | POA: Insufficient documentation

## 2018-05-23 DIAGNOSIS — Z7902 Long term (current) use of antithrombotics/antiplatelets: Secondary | ICD-10-CM | POA: Insufficient documentation

## 2018-05-23 DIAGNOSIS — I110 Hypertensive heart disease with heart failure: Secondary | ICD-10-CM | POA: Insufficient documentation

## 2018-05-23 DIAGNOSIS — Z885 Allergy status to narcotic agent status: Secondary | ICD-10-CM | POA: Diagnosis not present

## 2018-05-23 DIAGNOSIS — R079 Chest pain, unspecified: Secondary | ICD-10-CM

## 2018-05-23 DIAGNOSIS — Z87891 Personal history of nicotine dependence: Secondary | ICD-10-CM | POA: Insufficient documentation

## 2018-05-23 DIAGNOSIS — Z79899 Other long term (current) drug therapy: Secondary | ICD-10-CM | POA: Insufficient documentation

## 2018-05-23 DIAGNOSIS — I200 Unstable angina: Secondary | ICD-10-CM | POA: Diagnosis not present

## 2018-05-23 DIAGNOSIS — F1010 Alcohol abuse, uncomplicated: Secondary | ICD-10-CM | POA: Insufficient documentation

## 2018-05-23 DIAGNOSIS — Z8249 Family history of ischemic heart disease and other diseases of the circulatory system: Secondary | ICD-10-CM | POA: Insufficient documentation

## 2018-05-23 DIAGNOSIS — Z888 Allergy status to other drugs, medicaments and biological substances status: Secondary | ICD-10-CM | POA: Diagnosis not present

## 2018-05-23 HISTORY — DX: Chest pain, unspecified: R07.9

## 2018-05-23 LAB — URINALYSIS, ROUTINE W REFLEX MICROSCOPIC
Bilirubin Urine: NEGATIVE
Glucose, UA: NEGATIVE mg/dL
Hgb urine dipstick: NEGATIVE
Ketones, ur: NEGATIVE mg/dL
Nitrite: NEGATIVE
Protein, ur: NEGATIVE mg/dL
Specific Gravity, Urine: 1.006 (ref 1.005–1.030)
pH: 6 (ref 5.0–8.0)

## 2018-05-23 LAB — BASIC METABOLIC PANEL
Anion gap: 13 (ref 5–15)
BUN: 11 mg/dL (ref 8–23)
CO2: 24 mmol/L (ref 22–32)
Calcium: 9 mg/dL (ref 8.9–10.3)
Chloride: 100 mmol/L (ref 98–111)
Creatinine, Ser: 1.18 mg/dL (ref 0.61–1.24)
GFR calc Af Amer: >60 mL/min (ref >60)
GFR calc non Af Amer: 58 mL/min — ABNORMAL LOW (ref >60)
Glucose, Bld: 100 mg/dL — ABNORMAL HIGH (ref 70–99)
Potassium: 3.9 mmol/L (ref 3.5–5.1)
Sodium: 137 mmol/L (ref 135–145)

## 2018-05-23 LAB — CBC
HCT: 32.3 % — ABNORMAL LOW (ref 39.0–52.0)
Hemoglobin: 10.4 g/dL — ABNORMAL LOW (ref 13.0–17.0)
MCH: 30.8 pg (ref 26.0–34.0)
MCHC: 32.2 g/dL (ref 30.0–36.0)
MCV: 95.6 fL (ref 80.0–100.0)
Platelets: 202 10*3/uL (ref 150–400)
RBC: 3.38 MIL/uL — ABNORMAL LOW (ref 4.22–5.81)
RDW: 17 % — ABNORMAL HIGH (ref 11.5–15.5)
WBC: 7.2 10*3/uL (ref 4.0–10.5)
nRBC: 0 % (ref 0.0–0.2)

## 2018-05-23 LAB — TROPONIN I
Troponin I: <0.03 ng/mL (ref <0.03)
Troponin I: <0.03 ng/mL (ref <0.03)
Troponin I: <0.03 ng/mL (ref <0.03)
Troponin I: <0.03 ng/mL (ref <0.03)

## 2018-05-23 LAB — BRAIN NATRIURETIC PEPTIDE: B Natriuretic Peptide: 512.7 pg/mL — ABNORMAL HIGH (ref 0.0–100.0)

## 2018-05-23 LAB — HEPARIN LEVEL (UNFRACTIONATED): Heparin Unfractionated: 0.32 IU/mL (ref 0.30–0.70)

## 2018-05-23 LAB — SARS CORONAVIRUS 2 BY RT PCR (HOSPITAL ORDER, PERFORMED IN ~~LOC~~ HOSPITAL LAB): SARS Coronavirus 2: NEGATIVE

## 2018-05-23 MED ORDER — PANTOPRAZOLE SODIUM 40 MG PO TBEC
40.0000 mg | DELAYED_RELEASE_TABLET | Freq: Every day | ORAL | Status: DC
  Administered 2018-05-23 – 2018-05-24 (×2): 40 mg via ORAL
  Filled 2018-05-23 (×2): qty 1

## 2018-05-23 MED ORDER — SODIUM CHLORIDE 0.9 % IV SOLN: 250.0000 mL | INTRAVENOUS | Status: DC | PRN

## 2018-05-23 MED ORDER — ONDANSETRON HCL 4 MG PO TABS
8.0000 mg | ORAL_TABLET | Freq: Two times a day (BID) | ORAL | Status: DC
  Administered 2018-05-23 – 2018-05-24 (×3): 8 mg via ORAL
  Filled 2018-05-23 (×3): qty 2

## 2018-05-23 MED ORDER — SODIUM CHLORIDE 0.9% FLUSH
3.0000 mL | Freq: Two times a day (BID) | INTRAVENOUS | Status: DC
  Administered 2018-05-23: 3 mL via INTRAVENOUS

## 2018-05-23 MED ORDER — ONDANSETRON HCL 4 MG/2ML IJ SOLN: 4.0000 mg | Freq: Four times a day (QID) | INTRAMUSCULAR | Status: DC | PRN

## 2018-05-23 MED ORDER — ASPIRIN 81 MG PO CHEW
81.0000 mg | CHEWABLE_TABLET | ORAL | Status: AC
  Administered 2018-05-24: 81 mg via ORAL
  Filled 2018-05-23: qty 1

## 2018-05-23 MED ORDER — DOCUSATE SODIUM 100 MG PO CAPS
100.0000 mg | ORAL_CAPSULE | Freq: Every day | ORAL | Status: DC
  Administered 2018-05-23: 100 mg via ORAL
  Filled 2018-05-23 (×2): qty 1

## 2018-05-23 MED ORDER — NITROGLYCERIN 0.4 MG SL SUBL: 0.4000 mg | SUBLINGUAL_TABLET | SUBLINGUAL | Status: DC | PRN

## 2018-05-23 MED ORDER — ASPIRIN EC 81 MG PO TBEC: 81.0000 mg | DELAYED_RELEASE_TABLET | Freq: Every day | ORAL | Status: DC

## 2018-05-23 MED ORDER — HEPARIN BOLUS VIA INFUSION
4000.0000 [IU] | Freq: Once | INTRAVENOUS | Status: AC
  Administered 2018-05-23: 4000 [IU] via INTRAVENOUS
  Filled 2018-05-23: qty 4000

## 2018-05-23 MED ORDER — FINASTERIDE 5 MG PO TABS
5.0000 mg | ORAL_TABLET | Freq: Every day | ORAL | Status: DC
  Administered 2018-05-23 – 2018-05-24 (×2): 5 mg via ORAL
  Filled 2018-05-23 (×2): qty 1

## 2018-05-23 MED ORDER — RANOLAZINE ER 500 MG PO TB12
1000.0000 mg | ORAL_TABLET | Freq: Two times a day (BID) | ORAL | Status: DC
  Administered 2018-05-23 – 2018-05-24 (×3): 1000 mg via ORAL
  Filled 2018-05-23 (×3): qty 2

## 2018-05-23 MED ORDER — ISOSORBIDE MONONITRATE ER 60 MG PO TB24
60.0000 mg | ORAL_TABLET | Freq: Every day | ORAL | Status: DC
  Administered 2018-05-24: 60 mg via ORAL
  Filled 2018-05-23: qty 1

## 2018-05-23 MED ORDER — SODIUM CHLORIDE 0.9% FLUSH: 3.0000 mL | INTRAVENOUS | Status: DC | PRN

## 2018-05-23 MED ORDER — CLOPIDOGREL BISULFATE 75 MG PO TABS
75.0000 mg | ORAL_TABLET | Freq: Every day | ORAL | Status: DC
  Administered 2018-05-23 – 2018-05-24 (×2): 75 mg via ORAL
  Filled 2018-05-23 (×2): qty 1

## 2018-05-23 MED ORDER — PLECANATIDE 3 MG PO TABS
3.0000 mg | ORAL_TABLET | Freq: Every day | ORAL | Status: DC
  Administered 2018-05-23 – 2018-05-24 (×2): 3 mg via ORAL
  Filled 2018-05-23 (×3): qty 1

## 2018-05-23 MED ORDER — SODIUM CHLORIDE 0.9 % WEIGHT BASED INFUSION
3.0000 mL/kg/h | INTRAVENOUS | Status: DC
  Administered 2018-05-24: 3 mL/kg/h via INTRAVENOUS

## 2018-05-23 MED ORDER — ALPRAZOLAM 0.5 MG PO TABS
0.5000 mg | ORAL_TABLET | Freq: Two times a day (BID) | ORAL | Status: DC | PRN
  Administered 2018-05-23 – 2018-05-24 (×2): 0.5 mg via ORAL
  Filled 2018-05-23 (×2): qty 1

## 2018-05-23 MED ORDER — LATANOPROST 0.005 % OP SOLN
1.0000 [drp] | Freq: Every day | OPHTHALMIC | Status: DC
  Administered 2018-05-23: 1 [drp] via OPHTHALMIC
  Filled 2018-05-23: qty 2.5

## 2018-05-23 MED ORDER — NITROGLYCERIN 0.4 MG SL SUBL: 0.4000 mg | SUBLINGUAL_TABLET | Freq: Once | SUBLINGUAL | Status: DC

## 2018-05-23 MED ORDER — CARVEDILOL 6.25 MG PO TABS
6.2500 mg | ORAL_TABLET | Freq: Two times a day (BID) | ORAL | Status: DC
  Administered 2018-05-23 – 2018-05-24 (×3): 6.25 mg via ORAL
  Filled 2018-05-23 (×3): qty 1

## 2018-05-23 MED ORDER — DORZOLAMIDE HCL-TIMOLOL MAL 2-0.5 % OP SOLN
1.0000 [drp] | Freq: Two times a day (BID) | OPHTHALMIC | Status: DC
  Administered 2018-05-23 – 2018-05-24 (×2): 1 [drp] via OPHTHALMIC
  Filled 2018-05-23: qty 10

## 2018-05-23 MED ORDER — SODIUM CHLORIDE 0.9 % WEIGHT BASED INFUSION
1.0000 mL/kg/h | INTRAVENOUS | Status: DC
  Administered 2018-05-24: 1 mL/kg/h via INTRAVENOUS

## 2018-05-23 MED ORDER — HEPARIN (PORCINE) 25000 UT/250ML-% IV SOLN
1100.0000 [IU]/h | INTRAVENOUS | Status: DC
  Administered 2018-05-23: 1000 [IU]/h via INTRAVENOUS
  Filled 2018-05-23 (×2): qty 250

## 2018-05-23 MED ORDER — ACETAMINOPHEN 325 MG PO TABS: 650.0000 mg | ORAL_TABLET | ORAL | Status: DC | PRN

## 2018-05-23 MED ORDER — EZETIMIBE 10 MG PO TABS
10.0000 mg | ORAL_TABLET | Freq: Every day | ORAL | Status: DC
  Administered 2018-05-23 – 2018-05-24 (×2): 10 mg via ORAL
  Filled 2018-05-23 (×2): qty 1

## 2018-05-23 MED ORDER — NITROGLYCERIN IN D5W 200-5 MCG/ML-% IV SOLN
0.0000 ug/min | INTRAVENOUS | Status: DC
  Administered 2018-05-23: 5 ug/min via INTRAVENOUS
  Filled 2018-05-23 (×2): qty 250

## 2018-05-23 NOTE — ED Notes (Signed)
Dr. Rubin Payor made aware that patient is still having intermittent pain between titrations of nitro.

## 2018-05-23 NOTE — ED Triage Notes (Signed)
Pt arrives via [place] [facility] from home for c/o intermittent chest pain since yesterday evening, has been taking his sl nitro for with relief. Took 3 sl nitro and 324mg  asa prior to ems arrival and c/o no cp at this time. Pitting edema in bilateral LEs, reports he had been off of his lasix for a while because of feeling "hung over" in the mornings, and just started taking it again last night. Pt has cough that he reports is chronic, denies fevers, chills, sore throat, n/v/d. A/ox4, resp e/u, nad.

## 2018-05-23 NOTE — ED Notes (Signed)
ED Provider at bedside. 

## 2018-05-23 NOTE — Progress Notes (Signed)
ANTICOAGULATION CONSULT NOTE - Initial Consult  Pharmacy Consult for Heparin Indication: chest pain/ACS  Allergies  Allergen Reactions  . Codeine Other (See Comments)    unknown  . Lisinopril Cough  . Contrast Media [Iodinated Diagnostic Agents] Hives and Rash    Patient Measurements:   Heparin Dosing Weight: 80 kg  Vital Signs: Temp: 97.9 F (36.6 C) (05/10 8115) Temp Source: Oral (05/10 0637) BP: 153/79 (05/10 0700) Pulse Rate: 70 (05/10 0700)  Labs: No results for input(s): HGB, HCT, PLT, APTT, LABPROT, INR, HEPARINUNFRC, HEPRLOWMOCWT, CREATININE, CKTOTAL, CKMB, TROPONINI in the last 72 hours.  CrCl cannot be calculated (Patient's most recent lab result is older than the maximum 21 days allowed.).   Medical History: Past Medical History:  Diagnosis Date  . Anxiety   . Arthritis   . BPH (benign prostatic hyperplasia)   . CAD (coronary artery disease)   . Cataracts, bilateral   . Enlarged prostate   . GERD (gastroesophageal reflux disease)   . Glaucoma   . Hyperlipidemia   . Hypertension   . Non-ST elevation (NSTEMI) myocardial infarction (HCC) 08/21/2017  . Pneumonia   . PUD (peptic ulcer disease)   . Rotator cuff tear     Medications:  No current facility-administered medications on file prior to encounter.    Current Outpatient Medications on File Prior to Encounter  Medication Sig Dispense Refill  . alfuzosin (UROXATRAL) 10 MG 24 hr tablet Take 1 tablet by mouth daily.    Marland Kitchen ALPRAZolam (XANAX) 0.5 MG tablet Take 0.5 mg by mouth 2 (two) times daily as needed for anxiety or sleep.     Marland Kitchen aspirin EC 81 MG tablet Take 81 mg by mouth daily.    . bethanechol (URECHOLINE) 50 MG tablet Take 50 mg by mouth 3 (three) times daily.    . carvedilol (COREG) 6.25 MG tablet Take 1 tablet by mouth 2 (two) times daily.    . clopidogrel (PLAVIX) 75 MG tablet Take 1 tablet (75 mg total) by mouth daily. 30 tablet 4  . dorzolamide-timolol (COSOPT) 22.3-6.8 MG/ML ophthalmic  solution Place 1 drop into both eyes 2 (two) times daily.    Marland Kitchen ezetimibe (ZETIA) 10 MG tablet Take 1 tablet (10 mg total) by mouth daily. 30 tablet 11  . finasteride (PROSCAR) 5 MG tablet Take 1 tablet (5 mg total) by mouth daily. 30 tablet 4  . hydrocortisone 1 % ointment Apply topically 2 (two) times daily. Apply to medial right malleolus in a thin layer twice daily 30 g 0  . isosorbide mononitrate (IMDUR) 60 MG 24 hr tablet Take 1 tablet (60 mg total) by mouth daily. 30 tablet 4  . nitroGLYCERIN (NITROSTAT) 0.4 MG SL tablet Place 1 tablet (0.4 mg total) under the tongue every 5 (five) minutes as needed for chest pain. 25 tablet 3  . ondansetron (ZOFRAN) 8 MG tablet Take 8 mg by mouth 2 (two) times daily.     . pantoprazole (PROTONIX) 40 MG tablet Take 40 mg by mouth daily.     . ranolazine (RANEXA) 1000 MG SR tablet Take 1,000 mg by mouth 2 (two) times daily.     . TRAVATAN Z 0.004 % SOLN ophthalmic solution Place 1 drop into both eyes at bedtime.   6     Assessment: 78 y.o. male with chest pain for heparin Goal of Therapy:  Heparin level 0.3-0.7 units/ml Monitor platelets by anticoagulation protocol: Yes   Plan:  Heparin 4000 units IV bolus, then start heparin 1000  units/hr Check heparin level in 8 hours.   Lazariah Savard, Gary FleetGregory Vernon 05/23/2018,7:13 AM

## 2018-05-23 NOTE — ED Provider Notes (Signed)
[facility] EMERGENCY DEPARTMENT Provider Note   CSN: [id number] Arrival date & time: 05/23/18  4098    History   Chief Complaint Chief Complaint  Patient presents with  . Chest Pain    HPI Austin Malone is a 78 y.o. male.     HPI Patient presents with chest pain.  Has had chest pain since yesterday.  It is pressure in his mid chest and goes to both arms.  Feels like his previous angina.  States it began during a walk yesterday.  States that he normally is able to walk 30 minutes without problems but had to stop and take nitroglycerin this time because of the pain.  States that it really did not help the pain go away.  Pain eventually resolved with rest and has been coming and going all night.  States he also has episodes where he gets sweaty and feels cold.  These have been proceeding with chest pain.  States he goes to the dryer and will cycle out close to help keep himself warm.  States if he does too much exertion with these episodes he will get chest pain but does not normally have the chest pain.  He has a Foley catheter in place.  He has not been taking as much of his Lasix.  States it makes him feel hung over in the morning.  States he started taking it again yesterday but is been off it for about a week.  States he is carrying more fluid than he was previously.  Is an occasional unchanged cough.  No known sick contacts.  Patient had chest pain this morning and took 3 nitroglycerin and an aspirin.  Pain-free now. Past Medical History:  Diagnosis Date  . Anxiety   . Arthritis   . BPH (benign prostatic hyperplasia)   . CAD (coronary artery disease)   . Cataracts, bilateral   . Enlarged prostate   . GERD (gastroesophageal reflux disease)   . Glaucoma   . Hyperlipidemia   . Hypertension   . Non-ST elevation (NSTEMI) myocardial infarction (HCC) 08/21/2017  . Pneumonia   . PUD (peptic ulcer disease)   . Rotator cuff tear     Patient Active Problem List   Diagnosis Date Noted  . Essential hypertension 03/24/2018  . Upper GI bleed 02/19/2018  . Symptomatic anemia 02/19/2018  . AKI (acute kidney injury) (HCC) 02/19/2018  . Pericarditis 02/03/2018  . Chronic diastolic CHF (congestive heart failure) (HCC) 01/31/2018  . Chest pain at rest 09/29/2017  . Pre-operative clearance 09/08/2017  . Alcohol abuse 08/22/2017  . Anxiety 08/22/2017  . CAD (coronary artery disease) 08/18/2017  . BPH (benign prostatic hyperplasia) 07/21/2017  . Gastroesophageal reflux disease without esophagitis 07/21/2017  . 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 Hyperlipidemia 07/21/2017  . 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 Gallbladder sludge 04/01/2015    Past Surgical History:  Procedure Laterality Date  . APPENDECTOMY    . COLONOSCOPY WITH PROPOFOL N/A 02/21/2018   Procedure: COLONOSCOPY WITH PROPOFOL;  Surgeon: Sherrilyn Rist, MD;  Location: [facility] ENDOSCOPY;  Service: Gastroenterology;  Laterality: N/A;  . CORONARY ANGIOPLASTY WITH STENT PLACEMENT  11/2014   PCI of SVG to PDA x 2 with Xience DES to proximal stenosis, Ultra 5.0 BMS to distal thrombotic lesion  . CORONARY ARTERY BYPASS GRAFT  2008  . CORONARY ATHERECTOMY N/A 02/01/2018   Procedure: CORONARY ATHERECTOMY;  Surgeon: Yvonne Kendall, MD;  Location: [facility] INVASIVE CV LAB;  Service: Cardiovascular;  Laterality: N/A;  . CORONARY STENT INTERVENTION N/A 02/01/2018   Procedure: CORONARY STENT INTERVENTION;  Surgeon: Yvonne Kendall, MD;  Location: [facility] INVASIVE CV LAB;  Service: Cardiovascular;  Laterality: N/A;  . ESOPHAGOGASTRODUODENOSCOPY (EGD) WITH PROPOFOL N/A 02/20/2018   Procedure: ESOPHAGOGASTRODUODENOSCOPY (EGD) WITH PROPOFOL;  Surgeon: Sherrilyn Rist, MD;  Location: [facility] ENDOSCOPY;  Service: Gastroenterology;  Laterality: N/A;  . EYE SURGERY    . INTRAVASCULAR ULTRASOUND/IVUS N/A 02/01/2018   Procedure: Intravascular Ultrasound/IVUS;  Surgeon: Yvonne Kendall, MD;  Location: [facility] INVASIVE CV LAB;   Service: Cardiovascular;  Laterality: N/A;  . LEFT HEART CATH AND CORS/GRAFTS ANGIOGRAPHY N/A 08/21/2017   Procedure: LEFT HEART CATH AND CORS/GRAFTS ANGIOGRAPHY;  Surgeon: Yvonne Kendall, MD;  Location: [facility] INVASIVE CV LAB;  Service: Cardiovascular;  Laterality: N/A;  . LEFT HEART CATH AND CORS/GRAFTS ANGIOGRAPHY N/A 02/01/2018   Procedure: LEFT HEART CATH AND CORS/GRAFTS ANGIOGRAPHY;  Surgeon: Yvonne Kendall, MD;  Location: [facility] INVASIVE CV LAB;  Service: Cardiovascular;  Laterality: N/A;  . LEG SURGERY    . TONSILLECTOMY          Home Medications    Prior to Admission medications   Medication Sig Start Date End Date Taking? Authorizing Provider  ALPRAZolam Prudy Feeler) 0.5 MG tablet Take 0.5 mg by mouth 2 (two) times daily as needed for anxiety or sleep.  05/10/14  Yes [provider]  aspirin EC 81 MG tablet Take 81 mg by mouth daily.   Yes [provider]  carvedilol (COREG) 6.25 MG tablet Take 1 tablet by mouth 2 (two) times daily. 09/01/16  Yes [provider]  clopidogrel (PLAVIX) 75 MG tablet Take 1 tablet (75 mg total) by mouth daily. 02/02/18  Yes Rai, Ripudeep K, MD  docusate sodium (COLACE) 100 MG capsule Take 100 mg by mouth daily.   Yes [provider]  dorzolamide-timolol (COSOPT) 22.3-6.8 MG/ML ophthalmic solution Place 1 drop into both eyes 2 (two) times daily. 01/29/18  Yes [provider]  ezetimibe (ZETIA) 10 MG tablet Take 1 tablet (10 mg total) by mouth daily. 02/06/18  Yes Georgie Chard D, NP  finasteride (PROSCAR) 5 MG tablet Take 1 tablet (5 mg total) by mouth daily. 02/02/18  Yes Rai, Ripudeep K, MD  isosorbide mononitrate (IMDUR) 60 MG 24 hr tablet Take 1 tablet (60 mg total) by mouth daily. 02/02/18  Yes Rai, Ripudeep K, MD  nitroGLYCERIN (NITROSTAT) 0.4 MG SL tablet Place 1 tablet (0.4 mg total) under the tongue every 5 (five) minutes as needed for chest pain. 03/18/18  Yes Revankar, Aundra Dubin, MD  ondansetron (ZOFRAN) 8 MG tablet  Take 8 mg by mouth 2 (two) times daily.    Yes [provider]  pantoprazole (PROTONIX) 40 MG tablet Take 40 mg by mouth daily.    Yes [provider]  Plecanatide 3 MG TABS Take 3 mg by mouth daily.   Yes [provider]  ranolazine (RANEXA) 1000 MG SR tablet Take 1,000 mg by mouth 2 (two) times daily.  02/25/16  Yes [provider]  TRAVATAN Z 0.004 % SOLN ophthalmic solution Place 1 drop into both eyes at bedtime.  06/24/17  Yes [provider]  hydrocortisone 1 % ointment Apply topically 2 (two) times daily. Apply to medial right malleolus in a thin layer twice daily Patient not taking: Reported on 05/23/2018 02/02/18   Cathren Harsh, MD    Family History Family History  Problem Relation Age of Onset  . Diabetes Mother   .  Heart attack Brother     Social History Social History   Tobacco Use  . Smoking status: Former Games developermoker  . Smokeless tobacco: Never Used  Substance Use Topics  . Alcohol use: Yes    Alcohol/week: 21.0 standard drinks    Types: 21 Cans of beer per week  . Drug use: Yes    Types: Marijuana     Allergies   Codeine; Lisinopril; and Contrast media [iodinated diagnostic agents]   Review of Systems Review of Systems  Constitutional: Negative for appetite change.  HENT: Negative for congestion.   Respiratory: Positive for cough.   Cardiovascular: Positive for chest pain and leg swelling.  Gastrointestinal: Negative for abdominal pain.  Genitourinary: Negative for frequency.  Musculoskeletal: Negative for gait problem.  Neurological: Negative for weakness.  Psychiatric/Behavioral: Negative for confusion.     Physical Exam Updated Vital Signs BP (!) 157/77   Pulse 66   Temp 97.9 F (36.6 C) (Oral)   Resp 14   SpO2 98%   Physical Exam Vitals signs and nursing note reviewed.  HENT:     Head: Normocephalic.  Eyes:     Pupils: Pupils are equal, round, and reactive to light.  Cardiovascular:     Rate and  Rhythm: Normal rate and regular rhythm.  Pulmonary:     Breath sounds: No wheezing, rhonchi or rales.  Abdominal:     Tenderness: There is no abdominal tenderness.  Musculoskeletal:     Right lower leg: Edema present.     Left lower leg: Edema present.     Comments: Moderate pitting edema bilateral lower extremities  Skin:    General: Skin is warm.     Capillary Refill: Capillary refill takes less than 2 seconds.  Neurological:     Mental Status: He is alert and oriented to person, place, and time.      ED Treatments / Results  Labs (all labs ordered are listed, but only abnormal results are displayed) Labs Reviewed  BASIC METABOLIC PANEL - Abnormal; Notable for the following components:      Result Value   Glucose, Bld 100 (*)    GFR calc non Af Amer 58 (*)    All other components within normal limits  BRAIN NATRIURETIC PEPTIDE - Abnormal; Notable for the following components:   B Natriuretic Peptide 512.7 (*)    All other components within normal limits  CBC - Abnormal; Notable for the following components:   RBC 3.38 (*)    Hemoglobin 10.4 (*)    HCT 32.3 (*)    RDW 17.0 (*)    All other components within normal limits  SARS CORONAVIRUS 2 (HOSPITAL ORDER, PERFORMED IN [facility])  TROPONIN I  URINALYSIS, ROUTINE W REFLEX MICROSCOPIC  HEPARIN LEVEL (UNFRACTIONATED)    EKG EKG Interpretation  Date/Time:  Sunday May 23 2018 06:39:20 EDT Ventricular Rate:  64 PR Interval:    QRS Duration: 100 QT Interval:  435 QTC Calculation: 449 R Axis:   -2 Text Interpretation:  Sinus rhythm Nonspecific T abnormalities, lateral leads Confirmed by Benjiman CorePickering, Juliocesar Blasius [phone]([id number]) on 05/23/2018 6:56:42 AM  EKG Interpretation  Date/Time:  Sunday May 23 2018 08:11:20 EDT Ventricular Rate:  73 PR Interval:    QRS Duration: 104 QT Interval:  402 QTC Calculation: 443 R Axis:   -2 Text Interpretation:  Sinus rhythm Repol abnrm suggests ischemia, anterolateral increased ST  changes from prior today. Active pain with EKG Confirmed by Benjiman CorePickering, Tzivia Oneil [phone]([id number]) on 05/23/2018 8:21:16 AM  Radiology Dg Chest Portable 1 View  Result Date: 05/23/2018 CLINICAL DATA:  Pt. Has been c/o intermittent CP since yesterday afternoon. Hx of HTNchest pain EXAM: PORTABLE CHEST 1 VIEW COMPARISON:  Radiograph 02/19/2018 FINDINGS: Sternal wires overlie normal cardiac silhouette. Normal mediastinum and cardiac silhouette. Normal pulmonary vasculature. No evidence of effusion, infiltrate, or pneumothorax. No acute bony abnormality. IMPRESSION: No acute cardiopulmonary process. Electronically Signed   By: Genevive Bi M.D.   On: 05/23/2018 07:43    Procedures Procedures (including critical care time)  Medications Ordered in ED Medications  heparin ADULT infusion 100 units/mL (25000 units/286mL sodium chloride 0.45%) (1,000 Units/hr Intravenous New Bag/Given 05/23/18 0726)  nitroGLYCERIN 50 mg in dextrose 5 % 250 mL (0.2 mg/mL) infusion (10 mcg/min Intravenous Rate/Dose Change 05/23/18 0816)  nitroGLYCERIN (NITROSTAT) SL tablet 0.4 mg (0 mg Sublingual Hold 05/23/18 0833)  heparin bolus via infusion 4,000 Units (4,000 Units Intravenous Bolus from Bag 05/23/18 0725)     Initial Impression / Assessment and Plan / ED Course  I have reviewed the triage vital signs and the nursing notes.  Pertinent labs & imaging results that were available during my care of the patient were reviewed by me and considered in my medical decision making (see chart for details).        Patient presents with chest pain.  Began yesterday with exertion.  Normally would have no problems with this exertion.  States that pain is like his previous angina.  His come on and off during the night including this morning.  Pain-free upon arrival.  EKG is some nonspecific changes.  However was pain-free.  X-ray reassuring.  However while in the ER developed another episode of pain.  Started on nitroglycerin drip at  this time as he was already on a heparin drip.  Nitroglycerin relieved the pain.  However there is some nonspecific but potentially worsening EKG changes.  Will admit to cardiology once return of labs.  Labs returned.  Negative troponin.  BNP is somewhat elevated.  Will admit to cardiology.  CRITICAL CARE Performed by: Benjiman Core Total critical care time: 30 minutes Critical care time was exclusive of separately billable procedures and treating other patients. Critical care was necessary to treat or prevent imminent or life-threatening deterioration. Critical care was time spent personally by me on the following activities: development of treatment plan with patient and/or surrogate as well as nursing, discussions with consultants, evaluation of patient's response to treatment, examination of patient, obtaining history from patient or surrogate, ordering and performing treatments and interventions, ordering and review of laboratory studies, ordering and review of radiographic studies, pulse oximetry and re-evaluation of patient's condition.   Final Clinical Impressions(s) / ED Diagnoses   Final diagnoses:  Unstable angina [facility])    ED Discharge Orders    None       Benjiman Core, MD 05/23/18 [phone]

## 2018-05-23 NOTE — ED Notes (Signed)
Patient called out reporting 8/10 chest pain. Dr. Rubin Payor aware and new orders placed for nitro. ekg captured.

## 2018-05-23 NOTE — Progress Notes (Signed)
ANTICOAGULATION CONSULT NOTE Pharmacy Consult for Heparin Indication: chest pain/ACS  Allergies  Allergen Reactions  . Codeine Other (See Comments)    unknown  . Lisinopril Cough  . Contrast Media [Iodinated Diagnostic Agents] Hives and Rash    Patient Measurements:   Heparin Dosing Weight: 80 kg  Vital Signs: Temp: 97.8 F (36.6 C) (05/10 1141) Temp Source: Oral (05/10 1141) BP: 148/74 (05/10 1247) Pulse Rate: 66 (05/10 1247)  Labs: Recent Labs    05/23/18 0640 05/23/18 0955 05/23/18 1526  HGB 10.4*  --   --   HCT 32.3*  --   --   PLT 202  --   --   HEPARINUNFRC  --   --  0.32  CREATININE 1.18  --   --   TROPONINI <0.03 <0.03  --     CrCl cannot be calculated (Unknown ideal weight.).   Assessment: 78 y.o. male with chest pain for heparin Initial heparin level = 0.32  Goal of Therapy:  Heparin level 0.3-0.7 units/ml Monitor platelets by anticoagulation protocol: Yes   Plan:  Increase heparin to 1100 units / hr Follow up AM labs  Planning cath tomorrow  Thank you Okey Regal, PharmD [phone]  05/23/2018,3:53 PM

## 2018-05-23 NOTE — H&P (Signed)
Cardiology Admission History and Physical:   Patient ID: Austin Malone MRN: [id number]; DOB: 01-01-1941   Admission date: 05/23/2018  Primary Care Provider: Shelbie Ammons, MD Primary Cardiologist: Garwin Brothers, MD  Primary Electrophysiologist:  None   Chief Complaint:  Chest pain  Patient Profile:   Austin Malone is a 78 y.o. male with hx ofCAD s/p CABG and recent PCI/DES 02/01/18, HLD, HTN, hx MI, PUD and chronic foley catheter who presented to the ED on 05/23/18 for chest pain.   History of Present Illness:   Austin Malone has a history of hx ofCAD s/p CABG 2008 with graft stenosis and prior PCIs to VG to PDA and chronically occluded VG to diag and VG to OM and patent LIMA.He underwent cardiac cath and PCI to [facility] with orbital atherectomy and DES and PCI to [facility] and rPL with DES 02/01/18.   He was also seen for chest pain to [facility] on 02/05/2018 for chest pain that occurred with taking a deep breath and lying back in bed, improved with sitting up. NTG did not help his pain. He was transferred to [facility]. Troponin was elevated, however suspect the elevated troponin was related to small branch occlusion during the recent intervention.  His chest pain was felt to be consistent with pericarditis, worse with laying down better with sitting up.  It was decided to avoid NSAIDs and steroid given the need for triple therapy.  He was placed on colchicine 0.6 mg twice daily.  Echocardiogram obtained on 02/04/2018 showed a normal wall motion, no significant pericardial effusion, EF 55 to 60%. Relook cath was deferred and he was discharged home.   An echocardiogram on 02/21/2018 showed normal LV systolic function with EF 60-65%, trivial pericardial effusion.  Per ED notes, patient "arrives via  ems from home for c/o intermittent chest pain since yesterday evening, has been taking his sl nitro for with relief. Took 3 sl nitro and  asa prior to ems arrival and c/o no cp at this  time. Pitting edema in bilateral LEs, reports he had been off of his lasix for a while because of feeling "hung over" in the mornings, and just started taking it again last night." His pain was described as pressure in the mid chest that goes down both arms and feels like his previous angina.   COVID test in process  Labs in [facility] ED: -Troponin <0.03 -BNP 512.7  (BNP was 369 in 01/2018) -K+ 3.9 -SCr 1.18 -Hgb 10.4  CXR shows no acute cardiopulmonary process.   Past Medical History:  Diagnosis Date  . Anxiety   . Arthritis   . BPH (benign prostatic hyperplasia)   . CAD (coronary artery disease)   . Cataracts, bilateral   . Enlarged prostate   . GERD (gastroesophageal reflux disease)   . Glaucoma   . Hyperlipidemia   . Hypertension   . Non-ST elevation (NSTEMI) myocardial infarction (HCC) 08/21/2017  . Pneumonia   . PUD (peptic ulcer disease)   . Rotator cuff tear     Past Surgical History:  Procedure Laterality Date  . APPENDECTOMY    . COLONOSCOPY WITH PROPOFOL N/A 02/21/2018   Procedure: COLONOSCOPY WITH PROPOFOL;  Surgeon: Sherrilyn Rist, MD;  Location[facility] ENDOSCOPY;  Service: Gastroenterology;  Laterality: N/A;  . CORONARY ANGIOPLASTY WITH STENT PLACEMENT  11/2014   PCI of SVG to PDA x 2 with Xience DES to proximal stenosis, Ultra 5.0 BMS to distal thrombotic lesion  . CORONARY ARTERY BYPASS GRAFT  2008  . CORONARY ATHERECTOMY N/A 02/01/2018   Procedure: CORONARY ATHERECTOMY;  Surgeon: Yvonne Kendall, MD;  Location: [facility] INVASIVE CV LAB;  Service: Cardiovascular;  Laterality: N/A;  . CORONARY STENT INTERVENTION N/A 02/01/2018   Procedure: CORONARY STENT INTERVENTION;  Surgeon: Yvonne Kendall, MD;  Location: [facility] INVASIVE CV LAB;  Service: Cardiovascular;  Laterality: N/A;  . ESOPHAGOGASTRODUODENOSCOPY (EGD) WITH PROPOFOL N/A 02/20/2018   Procedure: ESOPHAGOGASTRODUODENOSCOPY (EGD) WITH PROPOFOL;  Surgeon: Sherrilyn Rist, MD;  Location: [facility] ENDOSCOPY;  Service:  Gastroenterology;  Laterality: N/A;  . EYE SURGERY    . INTRAVASCULAR ULTRASOUND/IVUS N/A 02/01/2018   Procedure: Intravascular Ultrasound/IVUS;  Surgeon: Yvonne Kendall, MD;  Location: [facility] INVASIVE CV LAB;  Service: Cardiovascular;  Laterality: N/A;  . LEFT HEART CATH AND CORS/GRAFTS ANGIOGRAPHY N/A 08/21/2017   Procedure: LEFT HEART CATH AND CORS/GRAFTS ANGIOGRAPHY;  Surgeon: Yvonne Kendall, MD;  Location: [facility] INVASIVE CV LAB;  Service: Cardiovascular;  Laterality: N/A;  . LEFT HEART CATH AND CORS/GRAFTS ANGIOGRAPHY N/A 02/01/2018   Procedure: LEFT HEART CATH AND CORS/GRAFTS ANGIOGRAPHY;  Surgeon: Yvonne Kendall, MD;  Location: [facility] INVASIVE CV LAB;  Service: Cardiovascular;  Laterality: N/A;  . LEG SURGERY    . TONSILLECTOMY       Medications Prior to Admission: Prior to Admission medications   Medication Sig Start Date End Date Taking? Authorizing Provider  ALPRAZolam Prudy Feeler) 0.5 MG tablet Take 0.5 mg by mouth 2 (two) times daily as needed for anxiety or sleep.  05/10/14  Yes [provider]  aspirin EC 81 MG tablet Take 81 mg by mouth daily.   Yes [provider]  carvedilol (COREG) 6.25 MG tablet Take 1 tablet by mouth 2 (two) times daily. 09/01/16  Yes [provider]  clopidogrel (PLAVIX) 75 MG tablet Take 1 tablet (75 mg total) by mouth daily. 02/02/18  Yes Rai, Ripudeep K, MD  docusate sodium (COLACE) 100 MG capsule Take 100 mg by mouth daily.   Yes [provider]  dorzolamide-timolol (COSOPT) 22.3-6.8 MG/ML ophthalmic solution Place 1 drop into both eyes 2 (two) times daily. 01/29/18  Yes [provider]  ezetimibe (ZETIA) 10 MG tablet Take 1 tablet (10 mg total) by mouth daily. 02/06/18  Yes Georgie Chard D, NP  finasteride (PROSCAR) 5 MG tablet Take 1 tablet (5 mg total) by mouth daily. 02/02/18  Yes Rai, Ripudeep K, MD  isosorbide mononitrate (IMDUR) 60 MG 24 hr tablet Take 1 tablet (60 mg total) by mouth daily. 02/02/18  Yes Rai, Ripudeep K,  MD  nitroGLYCERIN (NITROSTAT) 0.4 MG SL tablet Place 1 tablet (0.4 mg total) under the tongue every 5 (five) minutes as needed for chest pain. 03/18/18  Yes Revankar, Aundra Dubin, MD  ondansetron (ZOFRAN) 8 MG tablet Take 8 mg by mouth 2 (two) times daily.    Yes [provider]  pantoprazole (PROTONIX) 40 MG tablet Take 40 mg by mouth daily.    Yes [provider]  Plecanatide 3 MG TABS Take 3 mg by mouth daily.   Yes [provider]  ranolazine (RANEXA) 1000 MG SR tablet Take 1,000 mg by mouth 2 (two) times daily.  02/25/16  Yes [provider]  TRAVATAN Z 0.004 % SOLN ophthalmic solution Place 1 drop into both eyes at bedtime.  06/24/17  Yes [provider]  hydrocortisone 1 % ointment Apply topically 2 (two) times daily. Apply to medial right malleolus in a thin layer twice daily Patient not taking: Reported on 05/23/2018 02/02/18  Cathren Harshai, Ripudeep K, MD     Allergies:    Allergies  Allergen Reactions  . Codeine Other (See Comments)    unknown  . Lisinopril Cough  . Contrast Media [Iodinated Diagnostic Agents] Hives and Rash    Social History:   Social History   Socioeconomic History  . Marital status: Widowed    Spouse name: Not on file  . Number of children: Not on file  . Years of education: Not on file  . Highest education level: Not on file  Occupational History  . Occupation: Retired  Engineer, productionocial Needs  . Financial resource strain: Not on file  . Food insecurity:    Worry: Not on file    Inability: Not on file  . Transportation needs:    Medical: Not on file    Non-medical: Not on file  Tobacco Use  . Smoking status: Former Games developermoker  . Smokeless tobacco: Never Used  Substance and Sexual Activity  . Alcohol use: Yes    Alcohol/week: 21.0 standard drinks    Types: 21 Cans of beer per week  . Drug use: Yes    Types: Marijuana  . Sexual activity: Not on file  Lifestyle  . Physical activity:    Days per week: Not on file    Minutes  per session: Not on file  . Stress: Not on file  Relationships  . Social connections:    Talks on phone: Not on file    Gets together: Not on file    Attends religious service: Not on file    Active member of club or organization: Not on file    Attends meetings of clubs or organizations: Not on file    Relationship status: Not on file  . Intimate partner violence:    Fear of current or ex partner: Not on file    Emotionally abused: Not on file    Physically abused: Not on file    Forced sexual activity: Not on file  Other Topics Concern  . Not on file  Social History Narrative   [place].    Family History:   The patient's family history includes Diabetes in his mother; Heart attack in his brother.    ROS:  Please see the history of present illness.  All other ROS reviewed and negative.     Physical Exam/Data:   Vitals:   05/23/18 0815 05/23/18 0815 05/23/18 0830 05/23/18 0847  BP: (!) 143/70 (!) 143/70 (!) 157/77 (!) 176/87  Pulse: 73  66 65  Resp: (!) 24  14   Temp:      TempSrc:      SpO2: 99%  98%    No intake or output data in the 24 hours ending 05/23/18 0911 Last 3 Weights 03/24/2018 03/02/2018 02/21/2018  Weight (lbs) 189 lb 187 lb 184 lb 1.4 oz  Weight (kg) 85.73 kg 84.823 kg 83.5 kg      EKG:  The ECG that was done was personally reviewed and demonstrates Sinus rhythm, 73 bpm, Repol abnrm suggests ischemia, anterolateral  Relevant CV Studies:  Echo 02/21/2018 IMPRESSIONS   1. The left ventricle has normal systolic function of 60-65%. The cavity size was normal. There is no increased left ventricular wall thickness.  2. The right ventricle has normal systolic function. The cavity was normal . There is no increase in right ventricular wall thickness.  3. Trivial pericardial effusion.  4. The pericardial effusion is anterior to the right ventricle.  5. Trivial pericardial effusion. No septal shudder, normal size IVC with  normal collapse, no definite mitral or tricuspid inflow variation. No findings to suggest constrictive physiology.  FINDINGS  Left Ventricle: The left ventricle has normal systolic function of 60-65%. The cavity size was normal. There is no increased left ventricular wall thickness. Right Ventricle: The right ventricle has normal systolic function. The cavity was normal. There is no increase in right ventricular wall thickness. Left Atrium: Left atrial size was not assessed. Right Atrium: Right atrial size was not assessed. Interatrial Septum: The interatrial septum was not assessed.  Echo 02/04/18 Study Conclusions  - Left ventricle: The cavity size was mildly dilated. Systolic   function was normal. The estimated ejection fraction was in the   range of 55% to 60%. Wall motion was normal; there were no   regional wall motion abnormalities. Doppler parameters are   consistent with abnormal left ventricular relaxation (grade 1   diastolic dysfunction). - Aortic valve: There was mild regurgitation. - Mitral valve: Calcified annulus. - Left atrium: The atrium was mildly dilated. - Right ventricle: Systolic function was mildly reduced. - Atrial septum: No defect or patent foramen ovale was identified.  Impressions:  - Echo contrast used to better define endocardial borders. Normal   LV EF, no significant wallk motion abnormalities.  LHC 02/01/18 Conclusions: 1. Severe three-vessel native coronary artery disease; mid RCA stenosis is more severe (90-95%) than in 08/2017. Otherwise, no significant change is noted. 2. Patent SVG-rPDA with stable mild to moderate disease. 3. Patient LIMA-LAD. 4. Known occlusions of SVG-D and SVG-OM; grafts were not engaged on today's study. 5. Successful PCI to mid RCA using orbital atherectomy and Synergy 3.5 x 38 mm drug-eluting stent with 0% residual stenosis and TIMI-3 flow 6. Successful PCI to distal RCA and rPL using Synergy 2.75 x 24 mm  drug-eluting stent with 0% residual stenosis and TIMI-3 flow. 7. Successful placement of temporary transvenous pacemaker via the right femoral vein.  Recommendations: 1. Dual antiplatelet therapy with aspirin and clopidogrel for at least 12 months, ideally longer. 2. Aggressive secondary prevention. 3. Remove right femoral vein sheath when ACT < 175 seconds.   Laboratory Data:  Chemistry Recent Labs  Lab 05/23/18 0640  NA 137  K 3.9  CL 100  CO2 24  GLUCOSE 100*  BUN 11  CREATININE 1.18  CALCIUM 9.0  GFRNONAA 58*  GFRAA >60  ANIONGAP 13    No results for input(s): PROT, ALBUMIN, AST, ALT, ALKPHOS, BILITOT in the last 168 hours. Hematology Recent Labs  Lab 05/23/18 0640  WBC 7.2  RBC 3.38*  HGB 10.4*  HCT 32.3*  MCV 95.6  MCH 30.8  MCHC 32.2  RDW 17.0*  PLT 202   Cardiac Enzymes Recent Labs  Lab 05/23/18 0640  TROPONINI <0.03   No results for input(s): TROPIPOC in the last 168 hours.  BNP Recent Labs  Lab 05/23/18 0640  BNP 512.7*    DDimer No results for input(s): DDIMER in the last 168 hours.  Radiology/Studies:  Dg Chest Portable 1 View  Result Date: 05/23/2018 CLINICAL DATA:  Pt. Has been c/o intermittent CP since yesterday afternoon. Hx of HTNchest pain EXAM: PORTABLE CHEST 1 VIEW COMPARISON:  Radiograph 02/19/2018 FINDINGS: Sternal wires overlie normal cardiac silhouette. Normal mediastinum and cardiac silhouette. Normal pulmonary vasculature. No evidence of effusion, infiltrate, or pneumothorax. No acute bony abnormality. IMPRESSION: No acute cardiopulmonary process. Electronically Signed   By: Genevive Bi M.D.   On: 05/23/2018 07:43  Assessment and Plan:   1. Chest pain -Pt with known extensive CAD, last intervention in 01/2018 -Normal echo in 02/2018 -Presented to ED today with c/o CP at [facility] and transferred to [facility].  -First troponin negative. BNP elevated at 512. -EKG without acute ischemic changes. T wave inversions  improved since 02/2018.  -Heparin and IV nitroglycerin are infusing for possible ACS. -Admit and continue to trend troponins.   2. CAD -s/p CABG 2008 with graft stenosis and prior PCIs to VG to PDA and chronically occluded VG to diag and VG to OM and patent LIMA.He underwent cardiac cath and PCI to [facility] with orbital atherectomy and DES and PCI to [facility] and rPL with DES 02/01/18.  -Medical therapy includes beta blocker, Imdur 60 mg, Ranexa 1000mg  BID, aspirin 81 mg and Plavix 75 mg.   3. Hypertension -Controlled on coreg 6.25 mg BID, imdur 60 mg -BP currently elevated. -Will resume home meds and monitor.   4. Hyperlipidemia -Intolerant to statins. On Zetia 10 mg. LDL was 97 in 01/2018. PCSK9-I should be considered to target LDL goal of <70.   5. GERD -On PPI  6. Acute on chronic diastolic HF -BNP 800.3 (was 369 in 01/2018) -Per notes: Pitting edema in bilateral LEs, reports he had been off of his lasix for a while because of feeling "hung over" in the mornings, and just started taking it again last night. -Diurese with lasix IV  Severity of Illness: The appropriate patient status for this patient is OBSERVATION. Observation status is judged to be reasonable and necessary in order to provide the required intensity of service to ensure the patient's safety. The patient's presenting symptoms, physical exam findings, and initial radiographic and laboratory data in the context of their medical condition is felt to place them at decreased risk for further clinical deterioration. Furthermore, it is anticipated that the patient will be medically stable for discharge from the hospital within 2 midnights of admission. The following factors support the patient status of observation.   " The patient's presenting symptoms include chest pain, LE swelling . " The physical exam findings include LE edema, negative troponin, . " The initial radiographic and laboratory data are .   For questions or updates,  please contact [facility] Please consult [email or web address] for contact info under    Signed, Berton Bon, NP  05/23/2018 9:11 AM   History and all data above reviewed.  Patient examined.  I agree with the findings as above.    He has had severe CAD with chest pain as reported above.  He had his last PCI in Jan.  Following this he did have relief of symptoms to a degree.  He always takes NTG usually at night.  However, now over the last couple of nights he has had to take up to 10 with relief after one or two but the pain would come back.  He also noticed that he was having great difficulty over the last couple of days walking his usual route.  He had chest pain with this which is new and had increased DOE and fatigue with and post exercise.    GENERAL:  Well appearing HEENT:  Pupils equal round and reactive, fundi not visualized, oral mucosa unremarkable NECK:  No jugular venous distention, waveform within normal limits, carotid upstroke brisk and symmetric, no bruits, no thyromegaly LYMPHATICS:  No cervical, inguinal adenopathy LUNGS:  Clear to auscultation bilaterally BACK:  No CVA tenderness CHEST:  Well healed sternotomy scar. HEART:  PMI not displaced or sustained,S1 and S2 within normal limits, no S3, no S4, no clicks, no rubs, no murmurs ABD:  Flat, positive bowel sounds normal in frequency in pitch, no bruits, no rebound, no guarding, no midline pulsatile mass, no hepatomegaly, no splenomegaly EXT:  2 plus pulses throughout, no edema, no cyanosis no clubbing SKIN:  No rashes no nodules NEURO:  Cranial nerves II through XII grossly intact, motor grossly intact throughout PSYCH:  Cognitively intact, oriented to person place and time    All available labs, radiology testing, previous records reviewed. Agree with documented assessment and plan.   I reviewed the cath films from January.  He did have relief of symptoms after that PCI.  He now has symptoms that could represent new  stenosis or be related to his severe diffuse disease that needs medical management.   Unfortunately he needs another cardiac cath to assess this.  The patient understands that risks included but are not limited to stroke (1 in 1000), death (1 in 1000), kidney failure [usually temporary] (1 in 500), bleeding (1 in 200), allergic reaction [possibly serious] (1 in 200).  The patient understands and agrees to proceed.       Kalman Marcile Fuquay  11:51 AM  05/23/2018

## 2018-05-24 ENCOUNTER — Ambulatory Visit (HOSPITAL_COMMUNITY)
Admission: EM | Disposition: A | Discharge status: Home / Self Care | Payer: Self-pay | Source: Home / Self Care | Attending: Emergency Medicine

## 2018-05-24 ENCOUNTER — Telehealth: Payer: Self-pay | Admitting: Physician Assistant

## 2018-05-24 DIAGNOSIS — I25110 Atherosclerotic heart disease of native coronary artery with unstable angina pectoris: Secondary | ICD-10-CM | POA: Diagnosis not present

## 2018-05-24 DIAGNOSIS — I25710 Atherosclerosis of autologous vein coronary artery bypass graft(s) with unstable angina pectoris: Secondary | ICD-10-CM

## 2018-05-24 DIAGNOSIS — I200 Unstable angina: Secondary | ICD-10-CM

## 2018-05-24 DIAGNOSIS — E782 Mixed hyperlipidemia: Secondary | ICD-10-CM | POA: Diagnosis not present

## 2018-05-24 HISTORY — PX: LEFT HEART CATH AND CORS/GRAFTS ANGIOGRAPHY: CATH118250

## 2018-05-24 LAB — BASIC METABOLIC PANEL
Anion gap: 7 (ref 5–15)
BUN: 9 mg/dL (ref 8–23)
CO2: 22 mmol/L (ref 22–32)
Calcium: 8.4 mg/dL — ABNORMAL LOW (ref 8.9–10.3)
Chloride: 108 mmol/L (ref 98–111)
Creatinine, Ser: 1.09 mg/dL (ref 0.61–1.24)
GFR calc Af Amer: >60 mL/min (ref >60)
GFR calc non Af Amer: >60 mL/min (ref >60)
Glucose, Bld: 105 mg/dL — ABNORMAL HIGH (ref 70–99)
Potassium: 3.9 mmol/L (ref 3.5–5.1)
Sodium: 137 mmol/L (ref 135–145)

## 2018-05-24 LAB — CBC
HCT: 28.8 % — ABNORMAL LOW (ref 39.0–52.0)
Hemoglobin: 9.7 g/dL — ABNORMAL LOW (ref 13.0–17.0)
MCH: 31 pg (ref 26.0–34.0)
MCHC: 33.7 g/dL (ref 30.0–36.0)
MCV: 92 fL (ref 80.0–100.0)
Platelets: 178 10*3/uL (ref 150–400)
RBC: 3.13 MIL/uL — ABNORMAL LOW (ref 4.22–5.81)
RDW: 17.2 % — ABNORMAL HIGH (ref 11.5–15.5)
WBC: 5.9 10*3/uL (ref 4.0–10.5)
nRBC: 0 % (ref 0.0–0.2)

## 2018-05-24 LAB — POCT ACTIVATED CLOTTING TIME: Activated Clotting Time: 147 seconds

## 2018-05-24 LAB — HEPARIN LEVEL (UNFRACTIONATED): Heparin Unfractionated: 0.4 IU/mL (ref 0.30–0.70)

## 2018-05-24 SURGERY — LEFT HEART CATH AND CORS/GRAFTS ANGIOGRAPHY
Anesthesia: LOCAL

## 2018-05-24 MED ORDER — NITROGLYCERIN 0.4 MG SL SUBL: 0.4000 mg | SUBLINGUAL_TABLET | SUBLINGUAL | 3 refills | Status: DC | PRN

## 2018-05-24 MED ORDER — FENTANYL CITRATE (PF) 100 MCG/2ML IJ SOLN
INTRAMUSCULAR | Status: AC
  Filled 2018-05-24: qty 2

## 2018-05-24 MED ORDER — HEPARIN (PORCINE) IN NACL 1000-0.9 UT/500ML-% IV SOLN
INTRAVENOUS | Status: AC
  Filled 2018-05-24: qty 1000

## 2018-05-24 MED ORDER — MIDAZOLAM HCL 2 MG/2ML IJ SOLN
INTRAMUSCULAR | Status: DC | PRN
  Administered 2018-05-24: 1 mg via INTRAVENOUS

## 2018-05-24 MED ORDER — LABETALOL HCL 5 MG/ML IV SOLN: 10.0000 mg | INTRAVENOUS | Status: AC | PRN

## 2018-05-24 MED ORDER — METHYLPREDNISOLONE SODIUM SUCC 125 MG IJ SOLR
125.0000 mg | Freq: Once | INTRAMUSCULAR | Status: AC
  Administered 2018-05-24: 125 mg via INTRAVENOUS
  Filled 2018-05-24: qty 2

## 2018-05-24 MED ORDER — LIDOCAINE HCL (PF) 1 % IJ SOLN
INTRAMUSCULAR | Status: AC
  Filled 2018-05-24: qty 30

## 2018-05-24 MED ORDER — SODIUM CHLORIDE 0.9 % IV SOLN: INTRAVENOUS | Status: AC

## 2018-05-24 MED ORDER — CLOPIDOGREL BISULFATE 75 MG PO TABS: 75.0000 mg | ORAL_TABLET | Freq: Every day | ORAL | Status: DC

## 2018-05-24 MED ORDER — SODIUM CHLORIDE 0.9% FLUSH: 3.0000 mL | INTRAVENOUS | Status: DC | PRN

## 2018-05-24 MED ORDER — HEPARIN (PORCINE) IN NACL 1000-0.9 UT/500ML-% IV SOLN
INTRAVENOUS | Status: AC
  Filled 2018-05-24: qty 500

## 2018-05-24 MED ORDER — LIDOCAINE HCL (PF) 1 % IJ SOLN
INTRAMUSCULAR | Status: DC | PRN
  Administered 2018-05-24: 30 mL

## 2018-05-24 MED ORDER — DIPHENHYDRAMINE HCL 50 MG/ML IJ SOLN
25.0000 mg | Freq: Once | INTRAMUSCULAR | Status: AC
  Administered 2018-05-24: 25 mg via INTRAVENOUS
  Filled 2018-05-24: qty 1

## 2018-05-24 MED ORDER — IOHEXOL 350 MG/ML SOLN
INTRAVENOUS | Status: DC | PRN
  Administered 2018-05-24: 70 mL via INTRA_ARTERIAL

## 2018-05-24 MED ORDER — FENTANYL CITRATE (PF) 100 MCG/2ML IJ SOLN
INTRAMUSCULAR | Status: DC | PRN
  Administered 2018-05-24: 25 ug via INTRAVENOUS

## 2018-05-24 MED ORDER — HYDRALAZINE HCL 20 MG/ML IJ SOLN
10.0000 mg | INTRAMUSCULAR | Status: AC | PRN
  Administered 2018-05-24: 10 mg via INTRAVENOUS

## 2018-05-24 MED ORDER — SODIUM CHLORIDE 0.9% FLUSH: 3.0000 mL | Freq: Two times a day (BID) | INTRAVENOUS | Status: DC

## 2018-05-24 MED ORDER — ONDANSETRON HCL 4 MG/2ML IJ SOLN: 4.0000 mg | Freq: Four times a day (QID) | INTRAMUSCULAR | Status: DC | PRN

## 2018-05-24 MED ORDER — ACETAMINOPHEN 325 MG PO TABS: 650.0000 mg | ORAL_TABLET | ORAL | Status: DC | PRN

## 2018-05-24 MED ORDER — MIDAZOLAM HCL 2 MG/2ML IJ SOLN
INTRAMUSCULAR | Status: AC
  Filled 2018-05-24: qty 2

## 2018-05-24 MED ORDER — HYDRALAZINE HCL 20 MG/ML IJ SOLN
INTRAMUSCULAR | Status: AC
  Filled 2018-05-24: qty 1

## 2018-05-24 MED ORDER — SODIUM CHLORIDE 0.9 % IV SOLN: 250.0000 mL | INTRAVENOUS | Status: DC | PRN

## 2018-05-24 MED ORDER — ASPIRIN 81 MG PO CHEW: 81.0000 mg | CHEWABLE_TABLET | Freq: Every day | ORAL | Status: DC

## 2018-05-24 SURGICAL SUPPLY — 7 items
CATH INFINITI 5FR MULTPACK ANG (CATHETERS) ×2 IMPLANT
COVER DOME SNAP 22 D (MISCELLANEOUS) ×2 IMPLANT
KIT HEART LEFT (KITS) ×2 IMPLANT
PACK CARDIAC CATHETERIZATION (CUSTOM PROCEDURE TRAY) ×2 IMPLANT
SHEATH PINNACLE 5F 10CM (SHEATH) ×2 IMPLANT
TRANSDUCER W/STOPCOCK (MISCELLANEOUS) ×2 IMPLANT
WIRE EMERALD 3MM-J .035X150CM (WIRE) ×2 IMPLANT

## 2018-05-24 NOTE — Discharge Summary (Addendum)
Discharge Summary    Patient ID: Austin Malone,  MRN: [id number], DOB/AGE: [birth year] 78 y.o.  Admit date: 05/23/2018 Discharge date: 05/24/2018  Primary Care Provider: Shelbie AmmonsHaque, Imran P Primary Cardiologist: Dr. Tomie Chinaevankar  Discharge Diagnoses    Active Problems:   Chest pain   Unstable angina (HCC)   Allergies Allergies  Allergen Reactions  . Codeine Other (See Comments)    unknown  . Lisinopril Cough  . Contrast Media [Iodinated Diagnostic Agents] Hives and Rash    Diagnostic Studies/Procedures    Cath: 05/24/18   Ost LM to Mid LM lesion is 99% stenosed.  Ost Cx to Prox Cx lesion is 100% stenosed.  Ost LAD to Prox LAD lesion is 70% stenosed.  Ost Ramus lesion is 99% stenosed.  Ost RPDA lesion is 100% stenosed.  Previously placed Prox RCA to Mid RCA stent (unknown type) is widely patent.  Previously placed Post Atrio stent (unknown type) is widely patent.  Ost RCA to Prox RCA lesion is 30% stenosed.  Previously placed Prox Graft to Mid Graft stent (unknown type) is widely patent.  Dist Graft to Insertion lesion is 30% stenosed.  Origin to Prox Graft lesion is 100% stenosed.  Origin to Prox Graft lesion is 100% stenosed.  Diagnostic  Dominance: Right    _____________   History of Present Illness     Mr. 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   [facility] for chest pain that occurred with taking a deep breath and lying back in bed, improved with sitting up. NTG did not help his pain. [facility] Troponin was elevated, however suspect the elevated troponin was related to small branch occlusion during the recent intervention. His chest pain  was felt to be consistent with pericarditis, worse with laying down better with sitting up. It was decided to avoid NSAIDs and steroid given the need for triple therapy. He was placed on colchicine 0.6 mg twice daily. Echocardiogram obtained on 02/04/2018 showed a normal wall motion, no significant pericardial effusion, EF 55 to 60%. Relook cath was deferred and he was discharged home.   An echocardiogram on 02/21/2018 showed normal LV systolic function with EF 60-65%, trivial pericardial effusion.  Per ED notes, patient "arrives via [place] [facility] from home for c/o intermittent chest pain since yesterday evening, has been taking his sl nitro for with relief. Took 3 sl nitro and 324mg  asa prior to ems arrival and c/o no cp at this time. Pitting edema in bilateral LEs, reports he had been off of his lasix for a while because of feeling "hung over" in the mornings, and just started taking it again last night." His pain was described as pressure in the mid chest that goes down both arms and feels like his previous angina.   COVID test was negative.  [facility]: -Troponin <0.03 -BNP 512.7  (BNP was 369 in 01/2018) -K+ 3.9 -SCr 1.18 -Hgb 10.4  CXR showed no acute cardiopulmonary process.   Given symptoms he was admitted and planned for cardiac cath.   Hospital Course    Troponin cycled and negative. He was continued on IV  heparin/nitro until cath. Cardiac cath noted above with unchanged coronary anatomy from cath back in January. No culprit lesion noted. Plan for continued medical therapy. Cath site closed with mynx device. He was weaned from IV nitro. No recurrent chest pain. Able to ambulate without complications.Recommendation to continue on ASA and plavix. Other home medications continued without change. Of note, patient with statin intolerance. Consider outpatient PCSK9 lipid referral.   Shaune Seide was seen by Dr. Eldridge Dace and determined stable for discharge home. Follow up in the  office has been arranged. Medications are listed below.   _____________  Discharge Vitals Blood pressure (!) 165/64, pulse 65, temperature 98.2 F (36.8 C), temperature source Oral, resp. rate 20, weight 85.7 kg, SpO2 96 %.  Filed Weights   05/23/18 1900 05/24/18 0535  Weight: 85.7 kg 85.7 kg    Labs & Radiologic Studies    CBC Recent Labs    05/23/18 0640 05/24/18 0505  WBC 7.2 5.9  HGB 10.4* 9.7*  HCT 32.3* 28.8*  MCV 95.6 92.0  PLT 202 178   Basic Metabolic Panel Recent Labs    10/05/28 0640 05/24/18 0505  NA 137 137  K 3.9 3.9  CL 100 108  CO2 24 22  GLUCOSE 100* 105*  BUN 11 9  CREATININE 1.18 1.09  CALCIUM 9.0 8.4*   Liver Function Tests No results for input(s): AST, ALT, ALKPHOS, BILITOT, PROT, ALBUMIN in the last 72 hours. No results for input(s): LIPASE, AMYLASE in the last 72 hours. Cardiac Enzymes Recent Labs    05/23/18 0955 05/23/18 1526 05/23/18 2125  TROPONINI <0.03 <0.03 <0.03   BNP Invalid input(s): POCBNP D-Dimer No results for input(s): DDIMER in the last 72 hours. Hemoglobin A1C No results for input(s): HGBA1C in the last 72 hours. Fasting Lipid Panel No results for input(s): CHOL, HDL, LDLCALC, TRIG, CHOLHDL, LDLDIRECT in the last 72 hours. Thyroid Function Tests No results for input(s): TSH, T4TOTAL, T3FREE, THYROIDAB in the last 72 hours.  Invalid input(s): FREET3 _____________  Dg Chest Portable 1 View  Result Date: 05/23/2018 CLINICAL DATA:  Pt. Has been c/o intermittent CP since yesterday afternoon. Hx of HTNchest pain EXAM: PORTABLE CHEST 1 VIEW COMPARISON:  Radiograph 02/19/2018 FINDINGS: Sternal wires overlie normal cardiac silhouette. Normal mediastinum and cardiac silhouette. Normal pulmonary vasculature. No evidence of effusion, infiltrate, or pneumothorax. No acute bony abnormality. IMPRESSION: No acute cardiopulmonary process. Electronically Signed   By: Genevive Bi M.D.   On: 05/23/2018 07:43   Disposition    Pt is being discharged home today in good condition.  Follow-up Plans & Appointments    Follow-up Information    Revankar, Aundra Dubin, MD Follow up.   Specialty:  Cardiology Why:  as needed Contact information: [address] [place] [address] [phone]          Discharge Instructions    Call MD for:  redness, tenderness, or signs of infection (pain, swelling, redness, odor or green/yellow discharge around incision site)   Complete by:  As directed    Diet - low sodium heart healthy   Complete by:  As directed    Discharge instructions   Complete by:  As directed    Groin Site Care Refer to this sheet in the next few weeks. These instructions provide you with information on caring for yourself after your procedure. Your caregiver may also give you more specific instructions. Your treatment has been planned according to current medical practices, but problems sometimes occur. Call your caregiver  if you have any problems or questions after your procedure. HOME CARE INSTRUCTIONS You may shower 24 hours after the procedure. Remove the bandage (dressing) and gently wash the site with plain soap and water. Gently pat the site dry.  Do not apply powder or lotion to the site.  Do not sit in a bathtub, swimming pool, or whirlpool for 5 to 7 days.  No bending, squatting, or lifting anything over 10 pounds (4.5 kg) as directed by your caregiver.  Inspect the site at least twice daily.  Do not drive home if you are discharged the same day of the procedure. Have someone else drive you.  You may drive 24 hours after the procedure unless otherwise instructed by your caregiver.  What to expect: Any bruising Austin usually fade within 1 to 2 weeks.  Blood that collects in the tissue (hematoma) may be painful to the touch. It should usually decrease in size and tenderness within 1 to 2 weeks.  SEEK IMMEDIATE MEDICAL CARE IF: You have unusual pain at the groin site or down the  affected leg.  You have redness, warmth, swelling, or pain at the groin site.  You have drainage (other than a small amount of blood on the dressing).  You have chills.  You have a fever or persistent symptoms for more than 72 hours.  You have a fever and your symptoms suddenly get worse.  Your leg becomes pale, cool, tingly, or numb.  You have heavy bleeding from the site. Hold pressure on the site. .   Increase activity slowly   Complete by:  As directed        Discharge Medications     Medication List    STOP taking these medications   hydrocortisone 1 % ointment     TAKE these medications   ALPRAZolam 0.5 MG tablet Commonly known as:  XANAX Take 0.5 mg by mouth 2 (two) times daily as needed for anxiety or sleep.   aspirin EC 81 MG tablet Take 81 mg by mouth daily.   carvedilol 6.25 MG tablet Commonly known as:  COREG Take 1 tablet by mouth 2 (two) times daily.   clopidogrel 75 MG tablet Commonly known as:  PLAVIX Take 1 tablet (75 mg total) by mouth daily.   docusate sodium 100 MG capsule Commonly known as:  COLACE Take 100 mg by mouth daily.   dorzolamide-timolol 22.3-6.8 MG/ML ophthalmic solution Commonly known as:  COSOPT Place 1 drop into both eyes 2 (two) times daily.   ezetimibe 10 MG tablet Commonly known as:  ZETIA Take 1 tablet (10 mg total) by mouth daily.   finasteride 5 MG tablet Commonly known as:  PROSCAR Take 1 tablet (5 mg total) by mouth daily.   isosorbide mononitrate 60 MG 24 hr tablet Commonly known as:  IMDUR Take 1 tablet (60 mg total) by mouth daily.   nitroGLYCERIN 0.4 MG SL tablet Commonly known as:  NITROSTAT Place 1 tablet (0.4 mg total) under the tongue every 5 (five) minutes as needed for chest pain.   ondansetron 8 MG tablet Commonly known as:  ZOFRAN Take 8 mg by mouth 2 (two) times daily.   pantoprazole 40 MG tablet Commonly known as:  PROTONIX Take 40 mg by mouth daily.   Plecanatide 3 MG Tabs Take 3 mg by  mouth daily.   Ranexa 1000 MG SR tablet Generic drug:  ranolazine Take 1,000 mg by mouth 2 (two) times daily.   Travatan Z 0.004 % Soln  ophthalmic solution Generic drug:  Travoprost (BAK Free) Place 1 drop into both eyes at bedtime.        Acute coronary syndrome (MI, NSTEMI, STEMI, etc) this admission?: No.     Outstanding Labs/Studies   N/a  Duration of Discharge Encounter   Greater than 30 minutes including physician time.  Signed, Laverda Page NP-C 05/24/2018, 2:53 PM   I have examined the patient and reviewed assessment and plan and discussed with patient.  Agree with above as stated.    CAD: recurrent CP with negative troponin.  Wean IV NTG.  No targets for PCI.  COntinue medical therapy. If pain free later today, would plan on discharging the patient.  Patient relieved to know that he had collaterals to the occluded circ territory.    DAPT, antianginals and Zetia to continue.  Intolerant of statins.   Lance Muss

## 2018-05-24 NOTE — Progress Notes (Signed)
Site area: rt groin fa sheath Site Prior to Removal:  Level 1 small knot just above stick site Pressure Applied For: 20 minutes Manual:   yes Patient Status During Pull:  stable Post Pull Site:  Level 0 Post Pull Instructions Given:  yes Post Pull Pulses Present: rt dp dopplered Dressing Applied:  Gauze and tegaderm Bedrest begins @ 1030 Comments:

## 2018-05-24 NOTE — Progress Notes (Signed)
Progress Note  Patient Name: Austin Malone Date of Encounter: 05/24/2018  Primary Cardiologist: Garwin Brothers, MD   Subjective   CP returned after cath but resolved after IV NTG started  Inpatient Medications    Scheduled Meds: . aspirin EC  81 mg Oral Daily  . carvedilol  6.25 mg Oral BID  . clopidogrel  75 mg Oral Daily  . docusate sodium  100 mg Oral Daily  . dorzolamide-timolol  1 drop Both Eyes BID  . ezetimibe  10 mg Oral Daily  . finasteride  5 mg Oral Daily  . isosorbide mononitrate  60 mg Oral Daily  . latanoprost  1 drop Both Eyes QHS  . nitroGLYCERIN  0.4 mg Sublingual Once  . ondansetron  8 mg Oral BID  . pantoprazole  40 mg Oral Daily  . Plecanatide  3 mg Oral Daily  . ranolazine  1,000 mg Oral BID  . sodium chloride flush  3 mL Intravenous Q12H  . sodium chloride flush  3 mL Intravenous Q12H   Continuous Infusions: . sodium chloride 75 mL/hr at 05/24/18 0957  . sodium chloride    . nitroGLYCERIN 40 mcg/min (05/24/18 0957)   PRN Meds: sodium chloride, acetaminophen, ALPRAZolam, hydrALAZINE, labetalol, nitroGLYCERIN, ondansetron (ZOFRAN) IV, sodium chloride flush   Vital Signs    Vitals:   05/24/18 1015 05/24/18 1020 05/24/18 1025 05/24/18 1030  BP: (!) 166/72 (!) 156/62 (!) 166/62 (!) 165/64  Pulse: 63 63 61 65  Resp: 17 18 (!) 9 20  Temp:      TempSrc:      SpO2: 93% 95% 95% 96%  Weight:        Intake/Output Summary (Last 24 hours) at 05/24/2018 1240 Last data filed at 05/24/2018 0600 Gross per 24 hour  Intake 1164.33 ml  Output 1250 ml  Net -85.67 ml   Last 3 Weights 05/24/2018 05/23/2018 03/24/2018  Weight (lbs) 188 lb 14.4 oz 189 lb 189 lb  Weight (kg) 85.684 kg 85.73 kg 85.73 kg      Telemetry     NSR- Personally Reviewed  ECG    None today  Physical Exam   GEN: No acute distress.   Neck: No JVD Cardiac: RRR, no murmurs, rubs, or gallops.  Respiratory: Clear to auscultation bilaterally. GI: Soft, nontender,  non-distended  MS: No edema; No deformity. Neuro:  Nonfocal  Psych: Normal affect   Labs    Chemistry Recent Labs  Lab 05/23/18 0640 05/24/18 0505  NA 137 137  K 3.9 3.9  CL 100 108  CO2 24 22  GLUCOSE 100* 105*  BUN 11 9  CREATININE 1.18 1.09  CALCIUM 9.0 8.4*  GFRNONAA 58* >60  GFRAA >60 >60  ANIONGAP 13 7     Hematology Recent Labs  Lab 05/23/18 0640 05/24/18 0505  WBC 7.2 5.9  RBC 3.38* 3.13*  HGB 10.4* 9.7*  HCT 32.3* 28.8*  MCV 95.6 92.0  MCH 30.8 31.0  MCHC 32.2 33.7  RDW 17.0* 17.2*  PLT 202 178    Cardiac Enzymes Recent Labs  Lab 05/23/18 0640 05/23/18 0955 05/23/18 1526 05/23/18 2125  TROPONINI <0.03 <0.03 <0.03 <0.03   No results for input(s): TROPIPOC in the last 168 hours.   BNP Recent Labs  Lab 05/23/18 0640  BNP 512.7*     DDimer No results for input(s): DDIMER in the last 168 hours.   Radiology    Dg Chest Portable 1 View  Result Date: 05/23/2018 CLINICAL DATA:  Pt.  Has been c/o intermittent CP since yesterday afternoon. Hx of HTNchest pain EXAM: PORTABLE CHEST 1 VIEW COMPARISON:  Radiograph 02/19/2018 FINDINGS: Sternal wires overlie normal cardiac silhouette. Normal mediastinum and cardiac silhouette. Normal pulmonary vasculature. No evidence of effusion, infiltrate, or pneumothorax. No acute bony abnormality. IMPRESSION: No acute cardiopulmonary process. Electronically Signed   By: Genevive BiStewart  Edmunds M.D.   On: 05/23/2018 07:43    Cardiac Studies   Cath results reviewed with Dr. Allyson SabalBerry.  I personally looked at the images.   Patient Profile     78 y.o. male with diffuse CAD and chest pain  Assessment & Plan    1) CAD: recurrent CP with negative troponin.  Wean IV NTG.  No targets for PCI.  COntinue medical therapy. If pain free later today, would plan on discharging the patient.    Right groin stable. He will complete bedrest over the next few hours.   For questions or updates, please contact [facility] Please consult  [email or web address] for contact info under        Signed, Lance MussJayadeep Tamika Shropshire, MD  05/24/2018, 12:40 PM

## 2018-05-24 NOTE — Interval H&P Note (Signed)
Cath Lab Visit (complete for each Cath Lab visit)  Clinical Evaluation Leading to the Procedure:   ACS: Yes.    Non-ACS:    Anginal Classification: CCS III  Anti-ischemic medical therapy: Maximal Therapy (2 or more classes of medications)  Non-Invasive Test Results: No non-invasive testing performed  Prior CABG: Previous CABG      History and Physical Interval Note:  05/24/2018 9:05 AM  Ival Bible  has presented today for surgery, with the diagnosis of unstable angina.  The various methods of treatment have been discussed with the patient and family. After consideration of risks, benefits and other options for treatment, the patient has consented to  Procedure(s): LEFT HEART CATH AND CORS/GRAFTS ANGIOGRAPHY (N/A) as a surgical intervention.  The patient's history has been reviewed, patient examined, no change in status, stable for surgery.  I have reviewed the patient's chart and labs.  Questions were answered to the patient's satisfaction.     Nanetta Batty

## 2018-05-24 NOTE — Telephone Encounter (Signed)
See other encounter Ronie Spies PA-C

## 2018-05-24 NOTE — Telephone Encounter (Signed)
Rosanne Ashing, RN on 6E called to relay patient had requested refill of SL NTG at discharge to [facility]. Will send in refill.   Jashira Cotugno PA-C

## 2018-05-25 ENCOUNTER — Encounter (HOSPITAL_COMMUNITY): Payer: Self-pay | Admitting: Cardiovascular Disease

## 2018-05-25 MED FILL — Heparin Sod (Porcine)-NaCl IV Soln 1000 Unit/500ML-0.9%: INTRAVENOUS | Qty: 1000 | Status: AC

## 2018-05-26 SURGERY — PACEMAKER IMPLANT

## 2018-07-05 ENCOUNTER — Ambulatory Visit: Payer: Medicare HMO | Admitting: Cardiology

## 2018-07-21 ENCOUNTER — Telehealth: Payer: Self-pay

## 2018-07-21 NOTE — Telephone Encounter (Signed)
Left message for patient to call back to reschedule [facility] appt

## 2018-07-22 NOTE — Telephone Encounter (Signed)
Left vm for patient to call back and reschedule [facility] appt

## 2018-07-26 ENCOUNTER — Ambulatory Visit: Payer: Medicare HMO | Admitting: Cardiology

## 2018-08-02 ENCOUNTER — Ambulatory Visit: Payer: Medicare HMO | Admitting: Cardiology

## 2018-08-11 ENCOUNTER — Other Ambulatory Visit: Payer: Self-pay | Admitting: Cardiology

## 2018-08-11 MED ORDER — CLOPIDOGREL BISULFATE 75 MG PO TABS: 75.0000 mg | ORAL_TABLET | Freq: Every day | ORAL | 1 refills | Status: DC

## 2018-08-11 NOTE — Telephone Encounter (Signed)
°*  STAT* If patient is at the pharmacy, call can be transferred to refill team.   1. Which medications need to be refilled? (please list name of each medication and dose if known) clopidogrel (PLAVIX) 75 MG tablet   2. Which pharmacy/location (including street and city if local pharmacy) is medication to be sent to?  [place], [place] (Phone) [phone] (Fax)    3. Do they need a 30 day or 90 day supply? 90 day

## 2018-08-18 ENCOUNTER — Other Ambulatory Visit: Payer: Self-pay | Admitting: Physician Assistant

## 2018-08-19 NOTE — Telephone Encounter (Signed)
This is a [place] pt °

## 2018-08-19 NOTE — Telephone Encounter (Signed)
Duplicate refill

## 2018-08-20 ENCOUNTER — Ambulatory Visit: Payer: Medicare HMO | Admitting: Cardiology

## 2018-08-27 ENCOUNTER — Ambulatory Visit: Payer: Medicare HMO | Admitting: Cardiology

## 2018-08-31 ENCOUNTER — Ambulatory Visit (INDEPENDENT_AMBULATORY_CARE_PROVIDER_SITE_OTHER): Payer: Medicare HMO | Admitting: Cardiology

## 2018-08-31 ENCOUNTER — Other Ambulatory Visit: Payer: Self-pay

## 2018-08-31 ENCOUNTER — Encounter: Payer: Self-pay | Admitting: Cardiology

## 2018-08-31 VITALS — BP 110/68 | HR 57 | Ht 70.0 in | Wt 192.0 lb

## 2018-08-31 DIAGNOSIS — I2510 Atherosclerotic heart disease of native coronary artery without angina pectoris: Secondary | ICD-10-CM | POA: Diagnosis not present

## 2018-08-31 DIAGNOSIS — E782 Mixed hyperlipidemia: Secondary | ICD-10-CM | POA: Diagnosis not present

## 2018-08-31 DIAGNOSIS — Z951 Presence of aortocoronary bypass graft: Secondary | ICD-10-CM | POA: Diagnosis not present

## 2018-08-31 DIAGNOSIS — I10 Essential (primary) hypertension: Secondary | ICD-10-CM

## 2018-08-31 DIAGNOSIS — I5032 Chronic diastolic (congestive) heart failure: Secondary | ICD-10-CM

## 2018-08-31 NOTE — Progress Notes (Addendum)
Cardiology Office Note:    Date:  08/31/2018   ID:  Austin Malone, DOB 1941-01-10, MRN [id number]  PCP:  Clelia CroftGeorge, Robert, MD  Cardiologist:  Garwin Brothersajan R Malayia Spizzirri, MD   Referring MD: Clelia CroftGeorge, Robert, MD    ASSESSMENT:    1. Coronary artery disease involving native coronary artery of native heart without angina pectoris   2. Hx of CABG   3. Mixed hyperlipidemia   4. Essential hypertension   5. Chronic diastolic CHF (congestive heart failure) (HCC)    PLAN:    In order of problems listed above:  1. Coronary artery disease: Patient appears to be clinically stable.  Secondary prevention stressed.  Importance of compliance with diet and medication stressed and he vocalized understanding. 2. Essential hypertension: Blood pressure stable.  Diet was discussed.  His exercise tolerance is excellent.  He plans to undergo urological surgery and in view of his exercise tolerance I told him that he is not at high risk.  Uninterrupted perioperative beta-blockade and meticulous hemodynamic monitoring will further reduce the risk of coronary events.  3. Unfortunately for him he has had coronary intervention in the month of January.  So unless it is absolutely critical I would discourage from withholding dual antiplatelet therapy with aspirin and clopidogrel and this may rule out surgery for him for the current time.  If it is critically necessary for him to undergo the surgery I would like to have this discussion with the surgeon.  I discussed this with him.  The patient needs uninterrupted antiplatelet therapy at this time with aspirin and clopidogrel and I made it clear to him. 4. Mixed dyslipidemia: Lipids followed by primary care physician and will get a copy of them.  I am not sure why he is not on statin therapy and neither is he. If they are elevated I will initiate him on low-dose statin. 5. Patient will be seen in follow-up appointment in 6 months or earlier if the patient has any concerns     Medication Adjustments/Labs and Tests Ordered: Current medicines are reviewed at length with the patient today.  Concerns regarding medicines are outlined above.  No orders of the defined types were placed in this encounter.  No orders of the defined types were placed in this encounter.    Chief Complaint  Patient presents with  . Pre-op Exam    Catheter instillastion between belly button and pubic bone     History of Present Illness:    Austin BibleJames Fischler is a 78 y.o. male.  Patient has past medical history of coronary artery disease.  He denies any problems at this time and takes care of activities of daily living.  No chest pain orthopnea or PND.  The patient mentions to me that he walks up to half an hour daily without any problems.  At the time of my evaluation, the patient is alert awake oriented and in no distress.  Past Medical History:  Diagnosis Date  . Anxiety   . Arthritis   . BPH (benign prostatic hyperplasia)   . CAD (coronary artery disease)   . Cataracts, bilateral   . Enlarged prostate   . GERD (gastroesophageal reflux disease)   . Glaucoma   . Hyperlipidemia   . Hypertension   . Non-ST elevation (NSTEMI) myocardial infarction (HCC) 08/21/2017  . Pneumonia   . PUD (peptic ulcer disease)   . Rotator cuff tear     Past Surgical History:  Procedure Laterality Date  . APPENDECTOMY    .  COLONOSCOPY WITH PROPOFOL N/A 02/21/2018   Procedure: COLONOSCOPY WITH PROPOFOL;  Surgeon: Sherrilyn Ristanis, Henry L III, MD;  [facility];  Service: Gastroenterology;  Laterality: N/A;  . CORONARY ANGIOPLASTY WITH STENT PLACEMENT  11/2014   PCI of SVG to PDA x 2 with Xience DES to proximal stenosis, Ultra 5.0 BMS to distal thrombotic lesion  . CORONARY ARTERY BYPASS GRAFT  2008  . CORONARY ATHERECTOMY N/A 02/01/2018   Procedure: CORONARY ATHERECTOMY;  Surgeon: Yvonne KendallEnd, Christopher, MD;  [facility];  Service: Cardiovascular;  Laterality: N/A;  . CORONARY STENT  INTERVENTION N/A 02/01/2018   Procedure: CORONARY STENT INTERVENTION;  Surgeon: Yvonne KendallEnd, Christopher, MD;  [facility]: [facility];  Service: Cardiovascular;  Laterality: N/A;  . ESOPHAGOGASTRODUODENOSCOPY (EGD) WITH PROPOFOL N/A 02/20/2018   Procedure: ESOPHAGOGASTRODUODENOSCOPY (EGD) WITH PROPOFOL;  Surgeon: Sherrilyn Ristanis, Henry L III, MD;  [facility];  Service: Gastroenterology;  Laterality: N/A;  . EYE SURGERY    . INTRAVASCULAR ULTRASOUND/IVUS N/A 02/01/2018   Procedure: Intravascular Ultrasound/IVUS;  Surgeon: Yvonne KendallEnd, Christopher, MD;  [facility]: [facility];  Service: Cardiovascular;  Laterality: N/A;  . LEFT HEART CATH AND CORS/GRAFTS ANGIOGRAPHY N/A 08/21/2017   Procedure: LEFT HEART CATH AND CORS/GRAFTS ANGIOGRAPHY;  Surgeon: Yvonne KendallEnd, Christopher, MD;  [facility];  Service: Cardiovascular;  Laterality: N/A;  . LEFT HEART CATH AND CORS/GRAFTS ANGIOGRAPHY N/A 02/01/2018   Procedure: LEFT HEART CATH AND CORS/GRAFTS ANGIOGRAPHY;  Surgeon: Yvonne KendallEnd, Christopher, MD;  [facility];  Service: Cardiovascular;  Laterality: N/A;  . LEFT HEART CATH AND CORS/GRAFTS ANGIOGRAPHY N/A 05/24/2018   Procedure: LEFT HEART CATH AND CORS/GRAFTS ANGIOGRAPHY;  Surgeon: Runell GessBerry, Jonathan J, MD;  [facility];  Service: Cardiovascular;  Laterality: N/A;  . LEG SURGERY    . TONSILLECTOMY      Current Medications: Current Meds  Medication Sig  . alfuzosin (UROXATRAL) 10 MG 24 hr tablet TAKE 1 TABLET BY MOUTH ONCE (1) DAILY FOR bph  . ALPRAZolam (XANAX) 0.5 MG tablet Take 0.5 mg by mouth 2 (two) times daily as needed for anxiety or sleep.   Marland Kitchen. amoxicillin (AMOXIL) 875 MG tablet   . aspirin EC 81 MG tablet Take 81 mg by mouth daily.  . bethanechol (URECHOLINE) 50 MG tablet TAKE 1 TABLET BY MOUTH THREE (3) TIMES DAILY  . carvedilol (COREG) 6.25 MG tablet Take 1 tablet by mouth 2 (two) times daily.  . clopidogrel (PLAVIX) 75 MG tablet Take 1 tablet (75 mg total) by mouth  daily.  . clotrimazole-betamethasone (LOTRISONE) cream APPLY TO THE AFFECTED AREA(S) TWICE (2) DAILY  . Coenzyme Q10 100 MG TABS Take 200 mg by mouth daily.  Marland Kitchen. docusate sodium (COLACE) 100 MG capsule Take 100 mg by mouth daily.  . dorzolamide-timolol (COSOPT) 22.3-6.8 MG/ML ophthalmic solution Place 1 drop into both eyes 2 (two) times daily.  Marland Kitchen. ezetimibe (ZETIA) 10 MG tablet Take 1 tablet (10 mg total) by mouth daily.  . finasteride (PROSCAR) 5 MG tablet Take 1 tablet (5 mg total) by mouth daily.  . furosemide (LASIX) 20 MG tablet TAKE 1 TABLET BY MOUTH ONCE (1) DAILY  . isosorbide mononitrate (IMDUR) 60 MG 24 hr tablet Take 1 tablet (60 mg total) by mouth daily.  Marland Kitchen. levofloxacin (LEVAQUIN) 750 MG tablet TAKE 1 TABLET BY MOUTH ONCE (1) DAILY FOR 7 DAYS  . linaclotide (LINZESS) 290 MCG CAPS capsule Take 290 mcg by mouth daily before breakfast.  . Multiple Vitamins-Minerals (ZINC PO) Take 75  mg by mouth daily.  . nitroGLYCERIN (NITROSTAT) 0.4 MG SL tablet Place 1 tablet (0.4 mg total) under the tongue every 5 (five) minutes as needed for chest pain (up to 3 doses - if taking 3rd dose, call 911).  . ondansetron (ZOFRAN) 8 MG tablet Take 8 mg by mouth 2 (two) times daily.   . pantoprazole (PROTONIX) 40 MG tablet Take 40 mg by mouth daily.   . ranolazine (RANEXA) 1000 MG SR tablet Take 1,000 mg by mouth 2 (two) times daily.   . TRAVATAN Z 0.004 % SOLN ophthalmic solution Place 1 drop into both eyes at bedtime.   . [DISCONTINUED] Plecanatide 3 MG TABS Take 3 mg by mouth daily.     Allergies:   Codeine, Lisinopril, and Contrast media [iodinated diagnostic agents]   Social History   Socioeconomic History  . Marital status: Widowed    Spouse name: Not on file  . Number of children: Not on file  . Years of education: Not on file  . Highest education level: Not on file  Occupational History  . Occupation: Retired  Scientific laboratory technician  . Financial resource strain: Not on file  . Food insecurity     Worry: Not on file    Inability: Not on file  . Transportation needs    Medical: Not on file    Non-medical: Not on file  Tobacco Use  . Smoking status: Former Research scientist (life sciences)  . Smokeless tobacco: Never Used  Substance and Sexual Activity  . Alcohol use: Yes    Alcohol/week: 21.0 standard drinks    Types: 21 Cans of beer per week  . Drug use: Yes    Types: Marijuana  . Sexual activity: Not on file  Lifestyle  . Physical activity    Days per week: Not on file    Minutes per session: Not on file  . Stress: Not on file  Relationships  . Social Herbalist on phone: Not on file    Gets together: Not on file    Attends religious service: Not on file    Active member of club or organization: Not on file    Attends meetings of clubs or organizations: Not on file    Relationship status: Not on file  Other Topics Concern  . Not on file  Social History Narrative   He lives in [place], [place] alone.     Family History: The patient's family history includes Diabetes in his mother; Heart attack in his brother.  ROS:   Please see the history of present illness.    All other systems reviewed and are negative.  EKGs/Labs/Other Studies Reviewed:    The following studies were reviewed today: LEFT HEART CATH AND CORS/GRAFTS ANGIOGRAPHY  Conclusion    Ost LM to Mid LM lesion is 99% stenosed.  Ost Cx to Prox Cx lesion is 100% stenosed.  Ost LAD to Prox LAD lesion is 70% stenosed.  Ost Ramus lesion is 99% stenosed.  Ost RPDA lesion is 100% stenosed.  Previously placed Prox RCA to Mid RCA stent (unknown type) is widely patent.  Previously placed Post Atrio stent (unknown type) is widely patent.  Ost RCA to Prox RCA lesion is 30% stenosed.  Previously placed Prox Graft to Mid Graft stent (unknown type) is widely patent.  Dist Graft to Insertion lesion is 30% stenosed.  Origin to Prox Graft lesion is 100% stenosed.  Origin to Prox Graft lesion is 100%  stenosed.   Vanessa Kick  is a 78 y.o. male    [id number] LOCATION:  FACILITY: [facility]  PHYSICIAN: Nanetta BattyJonathan Berry, M.D. May 10, 1940   DATE OF PROCEDURE:  05/24/2018    IMPRESSION: Mr. Hutchinson's anatomy is unchanged compared to post native RCA intervention in January.  His stents to his native RCA and PLA branches were widely patent.  His vein to his occluded PDA was patent as was his LIMA to his LAD.  It certainly possible that his pain is coming from his occluded circumflex and ramus branch.  There are no "culprit lesions".  Medical therapy will be recommended.  A femoral angiogram was performed and the sheath entered at the "crux" of the common femoral, SFA and profunda femoris at the takeoff of a small branch and therefore a Mynx closure device was not deployed.  Manual compression will be done.  The patient left lab in stable condition.  Nanetta BattyJonathan Berry. MD, [facility] 05/24/2018 9:49 AM    Recent Labs: 02/20/2018: ALT 24; Magnesium 1.9; TSH 1.058 05/23/2018: B Natriuretic Peptide 512.7 05/24/2018: BUN 9; Creatinine, Ser 1.09; Hemoglobin 9.7; Platelets 178; Potassium 3.9; Sodium 137  Recent Lipid Panel    Component Value Date/Time   CHOL 176 02/04/2018 0230   CHOL 184 09/18/2017 1442   TRIG 96 02/04/2018 0230   HDL 60 02/04/2018 0230   HDL 71 09/18/2017 1442   CHOLHDL 2.9 02/04/2018 0230   VLDL 19 02/04/2018 0230   LDLCALC 97 02/04/2018 0230   LDLCALC 92 09/18/2017 1442    Physical Exam:    VS:  BP 110/68 (BP Location: Right Arm, Patient Position: Sitting, Cuff Size: Normal)   Pulse (!) 57   Ht 5\' 10"  (1.778 m)   Wt 192 lb (87.1 kg)   SpO2 98%   BMI 27.55 kg/m     Wt Readings from Last 3 Encounters:  08/31/18 192 lb (87.1 kg)  05/24/18 188 lb 14.4 oz (85.7 kg)  03/24/18 189 lb (85.7 kg)     GEN: Patient is in no acute distress HEENT: Normal NECK: No JVD; No carotid bruits LYMPHATICS: No lymphadenopathy CARDIAC: Hear sounds regular, 2/6 systolic murmur at the  apex. RESPIRATORY:  Clear to auscultation without rales, wheezing or rhonchi  ABDOMEN: Soft, non-tender, non-distended MUSCULOSKELETAL:  No edema; No deformity  SKIN: Warm and dry NEUROLOGIC:  Alert and oriented x 3 PSYCHIATRIC:  Normal affect   Signed, Garwin Brothersajan R Stormi Vandevelde, MD  08/31/2018 1:58 PM    [facility]

## 2018-08-31 NOTE — Patient Instructions (Signed)

## 2018-08-31 NOTE — Addendum Note (Signed)
Addended by: Beckey Rutter on: 08/31/2018 02:18 PM   Modules accepted: Orders

## 2018-09-13 ENCOUNTER — Telehealth: Payer: Self-pay | Admitting: *Deleted

## 2018-09-13 NOTE — Telephone Encounter (Signed)
Spoke with Austin Malone and let him know that Dr. Geraldo Pitter did not want him to have the surgery at this time, Austin Malone was disappointed but accepted his decision and will check in again down the road.

## 2018-09-13 NOTE — Telephone Encounter (Signed)
Patient came in with surgical clearance form saying that his surgeon stated Dr. Geraldo Pitter did not clear pt for surgery. Upon speaking with Dr. Geraldo Pitter he stated that he did not think pt should have surgery at this time due to need for anticoagulant. May be able to get surgery down the road but not at this time. If surgeon would like to call Dr. Geraldo Pitter to discuss this he should feel free to.

## 2018-09-17 ENCOUNTER — Other Ambulatory Visit: Payer: Self-pay

## 2018-09-17 ENCOUNTER — Ambulatory Visit (INDEPENDENT_AMBULATORY_CARE_PROVIDER_SITE_OTHER): Payer: Medicare HMO | Admitting: Cardiology

## 2018-09-17 ENCOUNTER — Encounter: Payer: Self-pay | Admitting: Cardiology

## 2018-09-17 VITALS — BP 130/72 | HR 59 | Ht 70.0 in | Wt 189.0 lb

## 2018-09-17 DIAGNOSIS — E782 Mixed hyperlipidemia: Secondary | ICD-10-CM | POA: Diagnosis not present

## 2018-09-17 DIAGNOSIS — Z951 Presence of aortocoronary bypass graft: Secondary | ICD-10-CM | POA: Diagnosis not present

## 2018-09-17 DIAGNOSIS — Z01810 Encounter for preprocedural cardiovascular examination: Secondary | ICD-10-CM

## 2018-09-17 DIAGNOSIS — I10 Essential (primary) hypertension: Secondary | ICD-10-CM

## 2018-09-17 HISTORY — DX: Encounter for preprocedural cardiovascular examination: Z01.810

## 2018-09-17 NOTE — Patient Instructions (Signed)

## 2018-09-17 NOTE — Progress Notes (Signed)
Cardiology Office Note:    Date:  09/17/2018   ID:  Austin Malone, DOB 1940-10-27, MRN [id number]  PCP:  Galvin ProfferHague, Imran P, MD  Cardiologist:  Garwin Brothersajan R Debbi Strandberg, MD   Referring MD: Galvin ProfferHague, Imran P, MD    ASSESSMENT:    1. Preoperative cardiovascular examination   2. Essential hypertension   3. Hx of CABG   4. Mixed hyperlipidemia    PLAN:    In order of problems listed above:  1. Preoperative cardiovascular risk stratification: Secondary  prevention stressed to the patient and he vocalized understanding.  His exercise tolerance is excellent.  I would have rather prefer him to be on dual a antiplatelet therapy for 1 year.  However  since he is going through severe pain and distress because of the urinary catheterization process I explained to him the benefits and risks of interrupting dual antiplatelet therapy and he is willing to take the risk.  In view of this I want to communicate with his urologist today with this note mentioning that his clopidogrel may be stopped for the shortest period of time possible for his urological procedure.  This may be for approximately a week or so.  Again the patient understands the risk of significant issues such as stent thrombosis.  He is willing to take the risk because he is just unable to bear with the pain of his regular catheterization process.  His blood pressure stable.  Further recommendations will be made if necessary.  Please do not hesitate to call us if there are any questions about his cardiovascular management.  Follow-up appointment in 6 months or earlier if he has any concerns.   Medication Adjustments/Labs and Tests Ordered: Current medicines are reviewed at length with the patient today.  Concerns regarding medicines are outlined above.  No orders of the defined types were placed in this encounter.  No orders of the defined types were placed in this encounter.    Chief Complaint  Patient presents with  . Pre-op Exam    supra  puberty catheder     History of Present Illness:    Austin BibleJames Spieth is a 78 y.o. male.  Patient has past medical history of coronary artery disease post CABG surgery.  He underwent coronary intervention in January.  Subsequent coronary angiography made with repair was unremarkable.  Currently he is doing fine.  He mentions to me that he walks about 2 miles a day on a regular basis without any problems.  No chest pain orthopnea or PND.  He has been wanting to get a urological procedure and surgery done.  He uses catheters for urination.  He mentions to me that this is very painful to him and he wants to get surgery done to get rid of this catheterization issue that he has to deal with all the time.  He is very frustrated with it at as it is a very painful process and therefore he is here back again to see me.  He was seen recently.  Past Medical History:  Diagnosis Date  . Anxiety   . Arthritis   . BPH (benign prostatic hyperplasia)   . CAD (coronary artery disease)   . Cataracts, bilateral   . Enlarged prostate   . GERD (gastroesophageal reflux disease)   . Glaucoma   . Hyperlipidemia   . Hypertension   . Non-ST elevation (NSTEMI) myocardial infarction (HCC) 08/21/2017  . Pneumonia   . PUD (peptic ulcer disease)   . Rotator cuff tear  Past Surgical History:  Procedure Laterality Date  . APPENDECTOMY    . COLONOSCOPY WITH PROPOFOL N/A 02/21/2018   Procedure: COLONOSCOPY WITH PROPOFOL;  Surgeon: Sherrilyn Rist, MD;  [facility]: [facility] ENDOSCOPY;  Service: Gastroenterology;  Laterality: N/A;  . CORONARY ANGIOPLASTY WITH STENT PLACEMENT  11/2014   PCI of SVG to PDA x 2 with Xience DES to proximal stenosis, Ultra 5.0 BMS to distal thrombotic lesion  . CORONARY ARTERY BYPASS GRAFT  2008  . CORONARY ATHERECTOMY N/A 02/01/2018   Procedure: CORONARY ATHERECTOMY;  Surgeon: Yvonne Kendall, MD;  [facility];  Service: Cardiovascular;  Laterality: N/A;  . CORONARY STENT  INTERVENTION N/A 02/01/2018   Procedure: CORONARY STENT INTERVENTION;  Surgeon: Yvonne Kendall, MD;  [facility];  Service: Cardiovascular;  Laterality: N/A;  . ESOPHAGOGASTRODUODENOSCOPY (EGD) WITH PROPOFOL N/A 02/20/2018   Procedure: ESOPHAGOGASTRODUODENOSCOPY (EGD) WITH PROPOFOL;  Surgeon: Sherrilyn Rist, MD;  Location[facility] ENDOSCOPY;  Service: Gastroenterology;  Laterality: N/A;  . EYE SURGERY    . INTRAVASCULAR ULTRASOUND/IVUS N/A 02/01/2018   Procedure: Intravascular Ultrasound/IVUS;  Surgeon: Yvonne Kendall, MD;  [facility];  Service: Cardiovascular;  Laterality: N/A;  . LEFT HEART CATH AND CORS/GRAFTS ANGIOGRAPHY N/A 08/21/2017   Procedure: LEFT HEART CATH AND CORS/GRAFTS ANGIOGRAPHY;  Surgeon: Yvonne Kendall, MD;  [facility];  Service: Cardiovascular;  Laterality: N/A;  . LEFT HEART CATH AND CORS/GRAFTS ANGIOGRAPHY N/A 02/01/2018   Procedure: LEFT HEART CATH AND CORS/GRAFTS ANGIOGRAPHY;  Surgeon: Yvonne Kendall, MD;  [facility];  Service: Cardiovascular;  Laterality: N/A;  . LEFT HEART CATH AND CORS/GRAFTS ANGIOGRAPHY N/A 05/24/2018   Procedure: LEFT HEART CATH AND CORS/GRAFTS ANGIOGRAPHY;  Surgeon: Runell Gess, MD;  [facility];  Service: Cardiovascular;  Laterality: N/A;  . LEG SURGERY    . TONSILLECTOMY      Current Medications: Current Meds  Medication Sig  . alfuzosin (UROXATRAL) 10 MG 24 hr tablet TAKE 1 TABLET BY MOUTH ONCE (1) DAILY FOR bph  . ALPRAZolam (XANAX) 0.5 MG tablet Take 0.5 mg by mouth 2 (two) times daily as needed for anxiety or sleep.   Marland Kitchen amoxicillin (AMOXIL) 875 MG tablet   . aspirin EC 81 MG tablet Take 81 mg by mouth daily.  . bethanechol (URECHOLINE) 50 MG tablet TAKE 1 TABLET BY MOUTH THREE (3) TIMES DAILY  . carvedilol (COREG) 6.25 MG tablet Take 1 tablet by mouth 2 (two) times daily.  . clopidogrel (PLAVIX) 75 MG tablet Take 1 tablet (75 mg total) by mouth  daily.  . clotrimazole-betamethasone (LOTRISONE) cream APPLY TO THE AFFECTED AREA(S) TWICE (2) DAILY  . Coenzyme Q10 100 MG TABS Take 200 mg by mouth daily.  Marland Kitchen docusate sodium (COLACE) 100 MG capsule Take 100 mg by mouth daily.  . dorzolamide-timolol (COSOPT) 22.3-6.8 MG/ML ophthalmic solution Place 1 drop into both eyes 2 (two) times daily.  Marland Kitchen ezetimibe (ZETIA) 10 MG tablet Take 1 tablet (10 mg total) by mouth daily.  . finasteride (PROSCAR) 5 MG tablet Take 1 tablet (5 mg total) by mouth daily.  . furosemide (LASIX) 20 MG tablet TAKE 1 TABLET BY MOUTH ONCE (1) DAILY  . isosorbide mononitrate (IMDUR) 60 MG 24 hr tablet Take 1 tablet (60 mg total) by mouth daily.  Marland Kitchen levofloxacin (LEVAQUIN) 750 MG tablet TAKE 1 TABLET BY MOUTH ONCE (1) DAILY FOR 7 DAYS  . linaclotide (LINZESS) 290 MCG CAPS capsule Take 290  mcg by mouth daily before breakfast.  . Multiple Vitamins-Minerals (ZINC PO) Take 75 mg by mouth daily.  . nitroGLYCERIN (NITROSTAT) 0.4 MG SL tablet Place 1 tablet (0.4 mg total) under the tongue every 5 (five) minutes as needed for chest pain (up to 3 doses - if taking 3rd dose, call 911).  . ondansetron (ZOFRAN) 8 MG tablet Take 8 mg by mouth 2 (two) times daily.   . pantoprazole (PROTONIX) 40 MG tablet Take 40 mg by mouth daily.   . ranolazine (RANEXA) 1000 MG SR tablet Take 1,000 mg by mouth 2 (two) times daily.   . TRAVATAN Z 0.004 % SOLN ophthalmic solution Place 1 drop into both eyes at bedtime.      Allergies:   Codeine, Lisinopril, and Contrast media [iodinated diagnostic agents]   Social History   Socioeconomic History  . Marital status: Widowed    Spouse name: Not on file  . Number of children: Not on file  . Years of education: Not on file  . Highest education level: Not on file  Occupational History  . Occupation: Retired  Engineer, productionocial Needs  . Financial resource strain: Not on file  . Food insecurity    Worry: Not on file    Inability: Not on file  . Transportation needs     Medical: Not on file    Non-medical: Not on file  Tobacco Use  . Smoking status: Former Games developermoker  . Smokeless tobacco: Never Used  Substance and Sexual Activity  . Alcohol use: Yes    Alcohol/week: 21.0 standard drinks    Types: 21 Cans of beer per week  . Drug use: Yes    Types: Marijuana  . Sexual activity: Not on file  Lifestyle  . Physical activity    Days per week: Not on file    Minutes per session: Not on file  . Stress: Not on file  Relationships  . Social Musicianconnections    Talks on phone: Not on file    Gets together: Not on file    Attends religious service: Not on file    Active member of club or organization: Not on file    Attends meetings of clubs or organizations: Not on file    Relationship status: Not on file  Other Topics Concern  . Not on file  Social History Narrative   [place].     Family History: The patient's family history includes Diabetes in his mother; Heart attack in his brother.  ROS:   Please see the history of present illness.    All other systems reviewed and are negative.  EKGs/Labs/Other Studies Reviewed:    The following studies were reviewed today: I reviewed coronary angiography reports from both times in the past 2 heart catheterizations that he has had.   Recent Labs: 02/20/2018: ALT 24; Magnesium 1.9; TSH 1.058 05/23/2018: B Natriuretic Peptide 512.7 05/24/2018: BUN 9; Creatinine, Ser 1.09; Hemoglobin 9.7; Platelets 178; Potassium 3.9; Sodium 137  Recent Lipid Panel    Component Value Date/Time   CHOL 176 02/04/2018 0230   CHOL 184 09/18/2017 1442   TRIG 96 02/04/2018 0230   HDL 60 02/04/2018 0230   HDL 71 09/18/2017 1442   CHOLHDL 2.9 02/04/2018 0230   VLDL 19 02/04/2018 0230   LDLCALC 97 02/04/2018 0230   LDLCALC 92 09/18/2017 1442    Physical Exam:    VS:  BP 130/72 (BP Location: Right Arm, Patient Position: Sitting, Cuff Size: Normal)  Pulse (!) 59   Ht 5\' 10"  (1.778 m)   Wt  189 lb (85.7 kg)   SpO2 99%   BMI 27.12 kg/m     Wt Readings from Last 3 Encounters:  09/17/18 189 lb (85.7 kg)  08/31/18 192 lb (87.1 kg)  05/24/18 188 lb 14.4 oz (85.7 kg)     GEN: Patient is in no acute distress HEENT: Normal NECK: No JVD; No carotid bruits LYMPHATICS: No lymphadenopathy CARDIAC: Hear sounds regular, 2/6 systolic murmur at the apex. RESPIRATORY:  Clear to auscultation without rales, wheezing or rhonchi  ABDOMEN: Soft, non-tender, non-distended MUSCULOSKELETAL:  No edema; No deformity  SKIN: Warm and dry NEUROLOGIC:  Alert and oriented x 3 PSYCHIATRIC:  Normal affect   Signed, Jenean Lindau, MD  09/17/2018 8:50 AM    [facility]

## 2018-10-21 ENCOUNTER — Telehealth: Payer: Self-pay | Admitting: Cardiology

## 2018-10-21 NOTE — Telephone Encounter (Signed)
Has experience CP for the past 3 or 4 nights that's been keeping him awake and incoherant, thinks he should change a medication

## 2018-10-22 NOTE — Telephone Encounter (Signed)
Patient states he does not know what is causing him chest pain, He is taking 8-10 nitro tabs in a 2 hr. RN expressed that after 3 nitro tab pt should seek medical attention but he claims he has been taking multiple tabs in past. He is walking 30 mins per day. RN advised that pt needs appt to see Dr. Docia Furl in next couple weeks. Note sent to L. Welch to try and get pt in sooner and to Dr. Docia Furl.

## 2018-10-22 NOTE — Telephone Encounter (Signed)
Called patient informed him that he needs to go be seen in the emergency department if he chest pain is not relieved by nitroglycerin. Patient is reluctant because he reports his last experience at  [facility] emergency room didn't go well. I gave him other emergency rooms he could go to instead. He plans to go if things get worse. He wanted to take a extra aspirin 81 mg (he already took one today) I advised him against this due to him being on plavix and the priority is for him to go to the emergency department due to chest pain not being relieved by nitro he verbally understood. No further questions.

## 2018-10-22 NOTE — Telephone Encounter (Signed)
Please let him know to go to the emergency room if he has any significant chest pain issues not resolving with nitroglycerin.

## 2018-10-22 NOTE — Telephone Encounter (Signed)
Please call the patient and know exactly what is going on with him and asking which medicine is causing this for him and when he is walking on a regular basis.

## 2018-10-23 DIAGNOSIS — R739 Hyperglycemia, unspecified: Secondary | ICD-10-CM | POA: Insufficient documentation

## 2018-10-23 HISTORY — DX: Hyperglycemia, unspecified: R73.9

## 2018-10-28 ENCOUNTER — Telehealth: Payer: Self-pay | Admitting: Cardiology

## 2018-10-28 NOTE — Telephone Encounter (Signed)
Patient was put on nitro patches in hospital. Should he put on a patch for 12 hours and take a nitro tablet when he does not have patch on if chest pain. He will need a nitro refill for tablets sent to [facility].

## 2018-10-29 MED ORDER — NITROGLYCERIN 0.4 MG SL SUBL: 0.4000 mg | SUBLINGUAL_TABLET | SUBLINGUAL | 0 refills | Status: DC | PRN

## 2018-10-29 NOTE — Telephone Encounter (Signed)
Patient called back explaining that he is using a nitroglycerin patch for 12 hours then skips the next 12 hours and then repeats the process. This was prescribed by his PCP after he was discharged from [facility]. Patient wants to know if he can take nitroglycerin sublingual tablets as needed during the time he is not wearing the nitro patch. Reviewed with Dr. Geraldo Pitter who advised patient can take 1 nitroglycerin tablet as needed; however, if his chest pain is not resolved with 1 tablet, then he should proceed to the emergency department for further evaluation. Patient informed to be sitting or lying down when taking the nitroglycerin sublingual tablets and to stay well hydrated to make sure his BP remains stable. Patient is agreeable and verbalized understanding. Refill for nitroglycerin sublingual tablets sent to [facility] in [place] as requested. No further questions.

## 2018-11-03 ENCOUNTER — Other Ambulatory Visit: Payer: Self-pay | Admitting: Cardiology

## 2018-11-16 ENCOUNTER — Other Ambulatory Visit: Payer: Self-pay | Admitting: Cardiology

## 2018-11-17 ENCOUNTER — Ambulatory Visit (INDEPENDENT_AMBULATORY_CARE_PROVIDER_SITE_OTHER): Payer: Medicare HMO | Admitting: Cardiology

## 2018-11-17 ENCOUNTER — Other Ambulatory Visit: Payer: Self-pay

## 2018-11-17 ENCOUNTER — Encounter: Payer: Self-pay | Admitting: Cardiology

## 2018-11-17 VITALS — BP 110/60 | HR 59 | Ht 70.0 in | Wt 184.0 lb

## 2018-11-17 DIAGNOSIS — I10 Essential (primary) hypertension: Secondary | ICD-10-CM | POA: Diagnosis not present

## 2018-11-17 DIAGNOSIS — Z13220 Encounter for screening for lipoid disorders: Secondary | ICD-10-CM

## 2018-11-17 DIAGNOSIS — Z951 Presence of aortocoronary bypass graft: Secondary | ICD-10-CM

## 2018-11-17 DIAGNOSIS — I2510 Atherosclerotic heart disease of native coronary artery without angina pectoris: Secondary | ICD-10-CM

## 2018-11-17 NOTE — Patient Instructions (Addendum)
Medication Instructions:  Your physician recommends that you continue on your current medications as directed. Please refer to the Current Medication list given to you today.  *If you need a refill on your cardiac medications before your next appointment, please call your pharmacy*  Lab Work: Your physician recommends that you return FASTING to have a hepatic and lipid drawn  If you have labs (blood work) drawn today and your tests are completely normal, you will receive your results only by: Marland Kitchen MyChart Message (if you have MyChart) OR . A paper copy in the mail If you have any lab test that is abnormal or we need to change your treatment, we will call you to review the results.  Testing/Procedures: NONE  Follow-Up: At [facility], you and your health needs are our priority.  As part of our continuing mission to provide you with exceptional heart care, we have created designated Provider Care Teams.  These Care Teams include your primary Cardiologist (physician) and Advanced Practice Providers (APPs -  Physician Assistants and Nurse Practitioners) who all work together to provide you with the care you need, when you need it.  Your next appointment:   6 months  The format for your next appointment:   In Person  Provider:   Jyl Heinz, MD

## 2018-11-17 NOTE — Addendum Note (Signed)
Addended by: Beckey Rutter on: 11/17/2018 04:04 PM   Modules accepted: Orders

## 2018-11-17 NOTE — Progress Notes (Signed)
Cardiology Office Note:    Date:  11/17/2018   ID:  Austin Malone, DOB 02-Jul-1940, MRN [id number]  PCP:  Austin Nasuti, MD  Cardiologist:  Austin Lindau, MD   Referring MD: Austin Nasuti, MD    ASSESSMENT:    1. Coronary artery disease involving native coronary artery of native heart without angina pectoris   2. Essential hypertension   3. Hx of CABG    PLAN:    In order of problems listed above:  1. Coronary artery disease: Secondary prevention stressed with the patient.  Importance of compliance with diet and medication stressed and he vocalized understanding. Essential hypertension: Blood pressure is stable. Mixed dyslipidemia: Diet was discussed with him at extensive length.  I reviewed blood work but no lipids were drawn so he will come back in the next few days for liver lipid check.  I congratulated him about his excellent exercise program Patient will be seen in follow-up appointment in 6 months or earlier if the patient has any concerns    Medication Adjustments/Labs and Tests Ordered: Current medicines are reviewed at length with the patient today.  Concerns regarding medicines are outlined above.  No orders of the defined types were placed in this encounter.  No orders of the defined types were placed in this encounter.    Chief Complaint  Patient presents with  . Hospitalization Follow-up     History of Present Illness:    Austin Malone is a 78 y.o. male.  Patient has past medical history of coronary artery disease post CABG surgery, essential hypertension and dyslipidemia.  He denies any problems at this time and takes care of activities of daily living.  No chest pain orthopnea or PND.  He walks half an hour a day on a daily basis.  At the time of my evaluation, the patient is alert awake oriented and in no distress.  Past Medical History:  Diagnosis Date  . Anxiety   . Arthritis   . BPH (benign prostatic hyperplasia)   . CAD (coronary artery  disease)   . Cataracts, bilateral   . Enlarged prostate   . GERD (gastroesophageal reflux disease)   . Glaucoma   . Hyperlipidemia   . Hypertension   . Non-ST elevation (NSTEMI) myocardial infarction ([place]) 08/21/2017  . Pneumonia   . PUD (peptic ulcer disease)   . Rotator cuff tear     Past Surgical History:  Procedure Laterality Date  . APPENDECTOMY    . COLONOSCOPY WITH PROPOFOL N/A 02/21/2018   Procedure: COLONOSCOPY WITH PROPOFOL;  Surgeon: Doran Stabler, MD;  Location: [place];  Service: Gastroenterology;  Laterality: N/A;  . CORONARY ANGIOPLASTY WITH STENT PLACEMENT  11/2014   PCI of SVG to PDA x 2 with Xience DES to proximal stenosis, Ultra 5.0 BMS to distal thrombotic lesion  . CORONARY ARTERY BYPASS GRAFT  2008  . CORONARY ATHERECTOMY N/A 02/01/2018   Procedure: CORONARY ATHERECTOMY;  Surgeon: Nelva Bush, MD;  Location: [place] [facility];  Service: Cardiovascular;  Laterality: N/A;  . CORONARY STENT INTERVENTION N/A 02/01/2018   Procedure: CORONARY STENT INTERVENTION;  Surgeon: Nelva Bush, MD;  Location: [place] [facility];  Service: Cardiovascular;  Laterality: N/A;  . ESOPHAGOGASTRODUODENOSCOPY (EGD) WITH PROPOFOL N/A 02/20/2018   Procedure: ESOPHAGOGASTRODUODENOSCOPY (EGD) WITH PROPOFOL;  Surgeon: Doran Stabler, MD;  Location: [place];  Service: Gastroenterology;  Laterality: N/A;  . EYE SURGERY    . INTRAVASCULAR ULTRASOUND/IVUS N/A 02/01/2018   Procedure:  Intravascular Ultrasound/IVUS;  Surgeon: Yvonne Kendall, MD;  [facility];  Service: Cardiovascular;  Laterality: N/A;  . LEFT HEART CATH AND CORS/GRAFTS ANGIOGRAPHY N/A 08/21/2017   Procedure: LEFT HEART CATH AND CORS/GRAFTS ANGIOGRAPHY;  Surgeon: Yvonne Kendall, MD;  Location: [facility];  Service: Cardiovascular;  Laterality: N/A;  . LEFT HEART CATH AND CORS/GRAFTS ANGIOGRAPHY N/A 02/01/2018   Procedure: LEFT HEART CATH AND CORS/GRAFTS ANGIOGRAPHY;  Surgeon: Yvonne Kendall, MD;  Location: [facility];  Service: Cardiovascular;  Laterality: N/A;  . LEFT HEART CATH AND CORS/GRAFTS ANGIOGRAPHY N/A 05/24/2018   Procedure: LEFT HEART CATH AND CORS/GRAFTS ANGIOGRAPHY;  Surgeon: Runell Gess, MD;  [facility];  Service: Cardiovascular;  Laterality: N/A;  . LEG SURGERY    . TONSILLECTOMY      Current Medications: Current Meds  Medication Sig  . alfuzosin (UROXATRAL) 10 MG 24 hr tablet TAKE 1 TABLET BY MOUTH ONCE (1) DAILY FOR bph  . ALPRAZolam (XANAX) 0.5 MG tablet Take 0.5 mg by mouth 2 (two) times daily as needed for anxiety or sleep.   Marland Kitchen amoxicillin (AMOXIL) 875 MG tablet   . aspirin EC 81 MG tablet Take 81 mg by mouth daily.  . bethanechol (URECHOLINE) 50 MG tablet TAKE 1 TABLET BY MOUTH THREE (3) TIMES DAILY  . bisacodyl (DULCOLAX) 5 MG EC tablet Take by mouth.  . carvedilol (COREG) 6.25 MG tablet Take 1 tablet by mouth 2 (two) times daily.  Revonda Humphrey 450 MG CAPS Take by mouth.  . cefdinir (OMNICEF) 300 MG capsule Take 300 mg by mouth 2 (two) times daily.  . Cholecalciferol (VITAMIN D-1000 MAX ST) 25 MCG (1000 UT) tablet Take by mouth 3 (three) times daily.  . clopidogrel (PLAVIX) 75 MG tablet TAKE 1 TABLET BY MOUTH ONCE (1) DAILY  . clotrimazole-betamethasone (LOTRISONE) cream APPLY TO THE AFFECTED AREA(S) TWICE (2) DAILY  . Coenzyme Q10 100 MG TABS Take 200 mg by mouth daily.  Marland Kitchen docusate sodium (COLACE) 100 MG capsule Take 100 mg by mouth daily.  . dorzolamide-timolol (COSOPT) 22.3-6.8 MG/ML ophthalmic solution Place 1 drop into both eyes 2 (two) times daily.  Marland Kitchen ezetimibe (ZETIA) 10 MG tablet Take 1 tablet (10 mg total) by mouth daily.  . finasteride (PROSCAR) 5 MG tablet Take 1 tablet (5 mg total) by mouth daily.  . fluconazole (DIFLUCAN) 100 MG tablet Take 100 mg by mouth every 3 (three) days.  . furosemide (LASIX) 20 MG tablet TAKE 1 TABLET BY MOUTH ONCE (1) DAILY  . isosorbide mononitrate (IMDUR) 60 MG 24 hr  tablet Take 1 tablet (60 mg total) by mouth daily.  Marland Kitchen levofloxacin (LEVAQUIN) 750 MG tablet TAKE 1 TABLET BY MOUTH ONCE (1) DAILY FOR 7 DAYS  . linaclotide (LINZESS) 290 MCG CAPS capsule Take 290 mcg by mouth daily before breakfast.  . Multiple Vitamins-Minerals (ZINC PO) Take 75 mg by mouth daily.  . nitrofurantoin, macrocrystal-monohydrate, (MACROBID) 100 MG capsule Take 100 mg by mouth 2 (two) times daily.  . nitroGLYCERIN (NITRODUR - DOSED IN MG/24 HR) 0.2 mg/hr patch apply 1 PATCH TO clean, dry SKIN ONCE daily. REMOVE PATCH AFTER 12 hours AND do not apply another PATCH FOR 12 hours  . nitroGLYCERIN (NITROSTAT) 0.4 MG SL tablet DISSOLVE 1 TABLET UNDER THE TONGUE EVERY 5 MINUTES AS NEEDED FOR CHEST PAIN. (MAXIMUM OF 3 DOSES). call 911 IF not better  . nystatin cream (MYCOSTATIN) APPLY TO THE AFFECTED AREA(S) OF penis/foreskin 3 TO 4  TIMES a DAY FOR yeast infection  . ondansetron (ZOFRAN) 8 MG tablet Take 8 mg by mouth 2 (two) times daily.   . pantoprazole (PROTONIX) 40 MG tablet Take 40 mg by mouth daily.   . ranolazine (RANEXA) 1000 MG SR tablet Take 1,000 mg by mouth 2 (two) times daily.   . TRAVATAN Z 0.004 % SOLN ophthalmic solution Place 1 drop into both eyes at bedtime.      Allergies:   Codeine, Lisinopril, and Contrast media [iodinated diagnostic agents]   Social History   Socioeconomic History  . Marital status: Widowed    Spouse name: Not on file  . Number of children: Not on file  . Years of education: Not on file  . Highest education level: Not on file  Occupational History  . Occupation: Retired  Engineer, production  . Financial resource strain: Not on file  . Food insecurity    Worry: Not on file    Inability: Not on file  . Transportation needs    Medical: Not on file    Non-medical: Not on file  Tobacco Use  . Smoking status: Former Games developer  . Smokeless tobacco: Never Used  Substance and Sexual Activity  . Alcohol use: Yes    Alcohol/week: 21.0 standard drinks     Types: 21 Cans of beer per week  . Drug use: Yes    Types: Marijuana  . Sexual activity: Not on file  Lifestyle  . Physical activity    Days per week: Not on file    Minutes per session: Not on file  . Stress: Not on file  Relationships  . Social Musician on phone: Not on file    Gets together: Not on file    Attends religious service: Not on file    Active member of club or organization: Not on file    Attends meetings of clubs or organizations: Not on file    Relationship status: Not on file  Other Topics Concern  . Not on file  Social History Narrative   He lives in [place], [place] alone.     Family History: The patient's family history includes Diabetes in his mother; Heart attack in his brother.  ROS:   Please see the history of present illness.    All other systems reviewed and are negative.  EKGs/Labs/Other Studies Reviewed:    The following studies were reviewed today: Runell Gess, MD (Primary)    Procedures  LEFT HEART CATH AND CORS/GRAFTS ANGIOGRAPHY  Conclusion    Ost LM to Mid LM lesion is 99% stenosed.  Ost Cx to Prox Cx lesion is 100% stenosed.  Ost LAD to Prox LAD lesion is 70% stenosed.  Ost Ramus lesion is 99% stenosed.  Ost RPDA lesion is 100% stenosed.  Previously placed Prox RCA to Mid RCA stent (unknown type) is widely patent.  Previously placed Post Atrio stent (unknown type) is widely patent.  Ost RCA to Prox RCA lesion is 30% stenosed.  Previously placed Prox Graft to Mid Graft stent (unknown type) is widely patent.  Dist Graft to Insertion lesion is 30% stenosed.  Origin to Prox Graft lesion is 100% stenosed.  Origin to Prox Graft lesion is 100% stenosed.   Dallas Torok is a 78 y.o. male    [id number] LOCATION:  FACILITY: [facility]  PHYSICIAN: Nanetta Batty, M.D. [birth year]   DATE OF PROCEDURE:  05/24/2018   CARDIAC CATHETERIZATION     History obtained from chart review.Fayrene Fearing  Hutchisonis a 78 y.o.malewith hxofCAD s/p CABGand recent PCI/DES 02/01/18,HLD, HTN, hx MI, PUD and chronic foley catheterwho presented to the ED on 05/23/18 for chest pain.  His troponins were negative.  He did have mildly elevated BNP.  His EKG showed no acute changes.    IMPRESSION: Mr. Hutchinson's anatomy is unchanged compared to post native RCA intervention in January.  His stents to his native RCA and PLA branches were widely patent.  His vein to his occluded PDA was patent as was his LIMA to his LAD.  It certainly possible that his pain is coming from his occluded circumflex and ramus branch.  There are no "culprit lesions".  Medical therapy will be recommended.  A femoral angiogram was performed and the sheath entered at the "crux" of the common femoral, SFA and profunda femoris at the takeoff of a small branch and therefore a Mynx closure device was not deployed.  Manual compression will be done.  The patient left lab in stable condition.  Nanetta BattyJonathan Berry. MD, [facility] 05/24/2018 9:49 AM    Recent Labs: 02/20/2018: ALT 24; Magnesium 1.9; TSH 1.058 05/23/2018: B Natriuretic Peptide 512.7 05/24/2018: BUN 9; Creatinine, Ser 1.09; Hemoglobin 9.7; Platelets 178; Potassium 3.9; Sodium 137  Recent Lipid Panel    Component Value Date/Time   CHOL 176 02/04/2018 0230   CHOL 184 09/18/2017 1442   TRIG 96 02/04/2018 0230   HDL 60 02/04/2018 0230   HDL 71 09/18/2017 1442   CHOLHDL 2.9 02/04/2018 0230   VLDL 19 02/04/2018 0230   LDLCALC 97 02/04/2018 0230   LDLCALC 92 09/18/2017 1442    Physical Exam:    VS:  BP 110/60 (BP Location: Right Arm, Patient Position: Sitting, Cuff Size: Normal)   Pulse (!) 59   Ht 5\' 10"  (1.778 m)   Wt 184 lb (83.5 kg)   SpO2 98%   BMI 26.40 kg/m     Wt Readings from Last 3 Encounters:  11/17/18 184 lb (83.5 kg)  09/17/18 189 lb (85.7 kg)  08/31/18 192 lb (87.1 kg)     GEN: Patient is in no acute distress HEENT: Normal NECK: No JVD; No carotid  bruits LYMPHATICS: No lymphadenopathy CARDIAC: Hear sounds regular, 2/6 systolic murmur at the apex. RESPIRATORY:  Clear to auscultation without rales, wheezing or rhonchi  ABDOMEN: Soft, non-tender, non-distended MUSCULOSKELETAL:  No edema; No deformity  SKIN: Warm and dry NEUROLOGIC:  Alert and oriented x 3 PSYCHIATRIC:  Normal affect   Signed, Garwin Brothersajan R Ceirra Belli, MD  11/17/2018 3:58 PM    [facility]

## 2018-12-01 ENCOUNTER — Telehealth: Payer: Self-pay | Admitting: Cardiology

## 2018-12-01 MED ORDER — NITROGLYCERIN 0.4 MG SL SUBL: SUBLINGUAL_TABLET | SUBLINGUAL | 2 refills | Status: DC

## 2018-12-01 NOTE — Telephone Encounter (Signed)
Pt. Is requesting a call back about his nitro and he says that his BP is really low in the morning.He says that he doesn't think that he is going to need the patches or the tab.

## 2018-12-01 NOTE — Telephone Encounter (Signed)
BP been running 98/63 without wearing the patch. Patient is discontinuing the nitro patch as of yesterday. Refill script for nitro tabs sent . Patient will continue to monitor his BP twice a day for the next few days. He will call with updates on Friday. Urged to seek immediate medical attention if any new nausea,vomiting, SOB, edema, dizziness or light headness occurs. Note routed to Dr. Docia Furl for update.

## 2018-12-02 NOTE — Telephone Encounter (Signed)
And please tell him to keep himself adequately hydrated.  Thanks

## 2019-01-12 ENCOUNTER — Other Ambulatory Visit: Payer: Self-pay | Admitting: Cardiology

## 2019-03-01 ENCOUNTER — Other Ambulatory Visit: Payer: Self-pay | Admitting: Cardiology

## 2019-03-14 DIAGNOSIS — N390 Urinary tract infection, site not specified: Secondary | ICD-10-CM

## 2019-03-14 HISTORY — DX: Urinary tract infection, site not specified: N39.0

## 2019-04-08 ENCOUNTER — Encounter (HOSPITAL_COMMUNITY): Payer: Self-pay

## 2019-04-08 ENCOUNTER — Other Ambulatory Visit: Payer: Self-pay

## 2019-04-08 ENCOUNTER — Inpatient Hospital Stay (HOSPITAL_COMMUNITY)
Admission: EM | Admit: 2019-04-08 | Discharge: 2019-04-11 | DRG: 312 | Disposition: A | Discharge status: Home Health | Payer: Medicare HMO | Attending: Internal Medicine | Admitting: Internal Medicine

## 2019-04-08 ENCOUNTER — Emergency Department (HOSPITAL_COMMUNITY): Payer: Medicare HMO

## 2019-04-08 ENCOUNTER — Observation Stay (HOSPITAL_COMMUNITY): Payer: Medicare HMO

## 2019-04-08 DIAGNOSIS — N390 Urinary tract infection, site not specified: Secondary | ICD-10-CM | POA: Diagnosis present

## 2019-04-08 DIAGNOSIS — Z7901 Long term (current) use of anticoagulants: Secondary | ICD-10-CM

## 2019-04-08 DIAGNOSIS — I252 Old myocardial infarction: Secondary | ICD-10-CM

## 2019-04-08 DIAGNOSIS — Z66 Do not resuscitate: Secondary | ICD-10-CM | POA: Diagnosis present

## 2019-04-08 DIAGNOSIS — Z87440 Personal history of urinary (tract) infections: Secondary | ICD-10-CM

## 2019-04-08 DIAGNOSIS — Z20822 Contact with and (suspected) exposure to covid-19: Secondary | ICD-10-CM | POA: Diagnosis present

## 2019-04-08 DIAGNOSIS — N200 Calculus of kidney: Secondary | ICD-10-CM | POA: Diagnosis present

## 2019-04-08 DIAGNOSIS — N179 Acute kidney failure, unspecified: Secondary | ICD-10-CM | POA: Diagnosis present

## 2019-04-08 DIAGNOSIS — R112 Nausea with vomiting, unspecified: Secondary | ICD-10-CM

## 2019-04-08 DIAGNOSIS — D649 Anemia, unspecified: Secondary | ICD-10-CM | POA: Diagnosis present

## 2019-04-08 DIAGNOSIS — Z8249 Family history of ischemic heart disease and other diseases of the circulatory system: Secondary | ICD-10-CM

## 2019-04-08 DIAGNOSIS — R072 Precordial pain: Secondary | ICD-10-CM | POA: Diagnosis not present

## 2019-04-08 DIAGNOSIS — R079 Chest pain, unspecified: Secondary | ICD-10-CM | POA: Diagnosis not present

## 2019-04-08 DIAGNOSIS — I10 Essential (primary) hypertension: Secondary | ICD-10-CM | POA: Diagnosis present

## 2019-04-08 DIAGNOSIS — Z833 Family history of diabetes mellitus: Secondary | ICD-10-CM

## 2019-04-08 DIAGNOSIS — Z955 Presence of coronary angioplasty implant and graft: Secondary | ICD-10-CM

## 2019-04-08 DIAGNOSIS — Z79899 Other long term (current) drug therapy: Secondary | ICD-10-CM

## 2019-04-08 DIAGNOSIS — I25110 Atherosclerotic heart disease of native coronary artery with unstable angina pectoris: Secondary | ICD-10-CM | POA: Diagnosis present

## 2019-04-08 DIAGNOSIS — Y846 Urinary catheterization as the cause of abnormal reaction of the patient, or of later complication, without mention of misadventure at the time of the procedure: Secondary | ICD-10-CM | POA: Diagnosis present

## 2019-04-08 DIAGNOSIS — I129 Hypertensive chronic kidney disease with stage 1 through stage 4 chronic kidney disease, or unspecified chronic kidney disease: Secondary | ICD-10-CM | POA: Diagnosis present

## 2019-04-08 DIAGNOSIS — T83511A Infection and inflammatory reaction due to indwelling urethral catheter, initial encounter: Secondary | ICD-10-CM | POA: Diagnosis present

## 2019-04-08 DIAGNOSIS — N183 Chronic kidney disease, stage 3 unspecified: Secondary | ICD-10-CM

## 2019-04-08 DIAGNOSIS — N400 Enlarged prostate without lower urinary tract symptoms: Secondary | ICD-10-CM | POA: Diagnosis present

## 2019-04-08 DIAGNOSIS — N3090 Cystitis, unspecified without hematuria: Secondary | ICD-10-CM | POA: Diagnosis present

## 2019-04-08 DIAGNOSIS — K219 Gastro-esophageal reflux disease without esophagitis: Secondary | ICD-10-CM | POA: Diagnosis present

## 2019-04-08 DIAGNOSIS — E785 Hyperlipidemia, unspecified: Secondary | ICD-10-CM | POA: Diagnosis present

## 2019-04-08 DIAGNOSIS — I951 Orthostatic hypotension: Secondary | ICD-10-CM | POA: Diagnosis present

## 2019-04-08 DIAGNOSIS — I2510 Atherosclerotic heart disease of native coronary artery without angina pectoris: Secondary | ICD-10-CM | POA: Diagnosis not present

## 2019-04-08 DIAGNOSIS — Z792 Long term (current) use of antibiotics: Secondary | ICD-10-CM

## 2019-04-08 DIAGNOSIS — I200 Unstable angina: Secondary | ICD-10-CM | POA: Diagnosis not present

## 2019-04-08 DIAGNOSIS — E860 Dehydration: Secondary | ICD-10-CM | POA: Diagnosis present

## 2019-04-08 DIAGNOSIS — Z951 Presence of aortocoronary bypass graft: Secondary | ICD-10-CM

## 2019-04-08 DIAGNOSIS — Z7982 Long term (current) use of aspirin: Secondary | ICD-10-CM

## 2019-04-08 DIAGNOSIS — H269 Unspecified cataract: Secondary | ICD-10-CM | POA: Diagnosis present

## 2019-04-08 HISTORY — DX: Nausea with vomiting, unspecified: R11.2

## 2019-04-08 HISTORY — DX: Urinary tract infection, site not specified: N39.0

## 2019-04-08 HISTORY — DX: Chronic kidney disease, stage 3 unspecified: N18.30

## 2019-04-08 LAB — CBC WITH DIFFERENTIAL/PLATELET
Abs Immature Granulocytes: 0.02 10*3/uL (ref 0.00–0.07)
Basophils Absolute: 0 10*3/uL (ref 0.0–0.1)
Basophils Relative: 0 %
Eosinophils Absolute: 0 10*3/uL (ref 0.0–0.5)
Eosinophils Relative: 1 %
HCT: 31.7 % — ABNORMAL LOW (ref 39.0–52.0)
Hemoglobin: 10.4 g/dL — ABNORMAL LOW (ref 13.0–17.0)
Immature Granulocytes: 0 %
Lymphocytes Relative: 23 %
Lymphs Abs: 1.3 10*3/uL (ref 0.7–4.0)
MCH: 33 pg (ref 26.0–34.0)
MCHC: 32.8 g/dL (ref 30.0–36.0)
MCV: 100.6 fL — ABNORMAL HIGH (ref 80.0–100.0)
Monocytes Absolute: 0.6 10*3/uL (ref 0.1–1.0)
Monocytes Relative: 10 %
Neutro Abs: 3.8 10*3/uL (ref 1.7–7.7)
Neutrophils Relative %: 66 %
Platelets: 208 10*3/uL (ref 150–400)
RBC: 3.15 MIL/uL — ABNORMAL LOW (ref 4.22–5.81)
RDW: 13.8 % (ref 11.5–15.5)
WBC: 5.8 10*3/uL (ref 4.0–10.5)
nRBC: 0 % (ref 0.0–0.2)

## 2019-04-08 LAB — SARS CORONAVIRUS 2 (TAT 6-24 HRS): SARS Coronavirus 2: NEGATIVE

## 2019-04-08 LAB — CBG MONITORING, ED
Glucose-Capillary: 100 mg/dL — ABNORMAL HIGH (ref 70–99)
Glucose-Capillary: 99 mg/dL (ref 70–99)

## 2019-04-08 LAB — COMPREHENSIVE METABOLIC PANEL
ALT: 15 U/L (ref 0–44)
AST: 26 U/L (ref 15–41)
Albumin: 3.3 g/dL — ABNORMAL LOW (ref 3.5–5.0)
Alkaline Phosphatase: 38 U/L (ref 38–126)
Anion gap: 12 (ref 5–15)
BUN: 16 mg/dL (ref 8–23)
CO2: 19 mmol/L — ABNORMAL LOW (ref 22–32)
Calcium: 8.3 mg/dL — ABNORMAL LOW (ref 8.9–10.3)
Chloride: 105 mmol/L (ref 98–111)
Creatinine, Ser: 1.58 mg/dL — ABNORMAL HIGH (ref 0.61–1.24)
GFR calc Af Amer: 47 mL/min — ABNORMAL LOW (ref >60)
GFR calc non Af Amer: 41 mL/min — ABNORMAL LOW (ref >60)
Glucose, Bld: 101 mg/dL — ABNORMAL HIGH (ref 70–99)
Potassium: 3.8 mmol/L (ref 3.5–5.1)
Sodium: 136 mmol/L (ref 135–145)
Total Bilirubin: 0.6 mg/dL (ref 0.3–1.2)
Total Protein: 6 g/dL — ABNORMAL LOW (ref 6.5–8.1)

## 2019-04-08 LAB — URINALYSIS, ROUTINE W REFLEX MICROSCOPIC
Bilirubin Urine: NEGATIVE
Glucose, UA: NEGATIVE mg/dL
Hgb urine dipstick: NEGATIVE
Ketones, ur: 20 mg/dL — AB
Nitrite: NEGATIVE
Protein, ur: 100 mg/dL — AB
Specific Gravity, Urine: 1.021 (ref 1.005–1.030)
WBC, UA: >50 WBC/hpf — ABNORMAL HIGH (ref 0–5)
pH: 6 (ref 5.0–8.0)

## 2019-04-08 LAB — TROPONIN I (HIGH SENSITIVITY)
Troponin I (High Sensitivity): 10 ng/L (ref <18)
Troponin I (High Sensitivity): 12 ng/L (ref <18)
Troponin I (High Sensitivity): 16 ng/L (ref <18)
Troponin I (High Sensitivity): 22 ng/L — ABNORMAL HIGH (ref <18)

## 2019-04-08 LAB — CBC
HCT: 31.1 % — ABNORMAL LOW (ref 39.0–52.0)
Hemoglobin: 10.3 g/dL — ABNORMAL LOW (ref 13.0–17.0)
MCH: 32.8 pg (ref 26.0–34.0)
MCHC: 33.1 g/dL (ref 30.0–36.0)
MCV: 99 fL (ref 80.0–100.0)
Platelets: 202 10*3/uL (ref 150–400)
RBC: 3.14 MIL/uL — ABNORMAL LOW (ref 4.22–5.81)
RDW: 13.8 % (ref 11.5–15.5)
WBC: 5.8 10*3/uL (ref 4.0–10.5)
nRBC: 0 % (ref 0.0–0.2)

## 2019-04-08 LAB — LIPASE, BLOOD: Lipase: 20 U/L (ref 11–51)

## 2019-04-08 LAB — GLUCOSE, CAPILLARY: Glucose-Capillary: 96 mg/dL (ref 70–99)

## 2019-04-08 LAB — ETHANOL: Alcohol, Ethyl (B): <10 mg/dL (ref <10)

## 2019-04-08 LAB — CREATININE, SERUM
Creatinine, Ser: 1.64 mg/dL — ABNORMAL HIGH (ref 0.61–1.24)
GFR calc Af Amer: 45 mL/min — ABNORMAL LOW (ref >60)
GFR calc non Af Amer: 39 mL/min — ABNORMAL LOW (ref >60)

## 2019-04-08 MED ORDER — FINASTERIDE 5 MG PO TABS
5.0000 mg | ORAL_TABLET | Freq: Every day | ORAL | Status: DC
  Administered 2019-04-08 – 2019-04-11 (×4): 5 mg via ORAL
  Filled 2019-04-08 (×4): qty 1

## 2019-04-08 MED ORDER — HYDRALAZINE HCL 20 MG/ML IJ SOLN
10.0000 mg | INTRAMUSCULAR | Status: DC | PRN
  Filled 2019-04-08: qty 1

## 2019-04-08 MED ORDER — CARVEDILOL 6.25 MG PO TABS
6.2500 mg | ORAL_TABLET | Freq: Two times a day (BID) | ORAL | Status: DC
  Administered 2019-04-08 – 2019-04-11 (×7): 6.25 mg via ORAL
  Filled 2019-04-08 (×7): qty 1

## 2019-04-08 MED ORDER — SODIUM CHLORIDE 0.9 % IV SOLN
1.0000 g | INTRAVENOUS | Status: AC
  Administered 2019-04-08 – 2019-04-10 (×3): 1 g via INTRAVENOUS
  Filled 2019-04-08 (×3): qty 10

## 2019-04-08 MED ORDER — MORPHINE SULFATE (PF) 2 MG/ML IV SOLN
2.0000 mg | INTRAVENOUS | Status: DC | PRN
  Administered 2019-04-08 – 2019-04-09 (×2): 2 mg via INTRAVENOUS
  Filled 2019-04-08 (×3): qty 1

## 2019-04-08 MED ORDER — NITROGLYCERIN 0.4 MG SL SUBL
0.4000 mg | SUBLINGUAL_TABLET | SUBLINGUAL | Status: AC | PRN
  Administered 2019-04-08 (×3): 0.4 mg via SUBLINGUAL
  Filled 2019-04-08 (×3): qty 1

## 2019-04-08 MED ORDER — EZETIMIBE 10 MG PO TABS
10.0000 mg | ORAL_TABLET | Freq: Every day | ORAL | Status: DC
  Administered 2019-04-08 – 2019-04-11 (×4): 10 mg via ORAL
  Filled 2019-04-08 (×4): qty 1

## 2019-04-08 MED ORDER — NITROGLYCERIN 0.4 MG SL SUBL
0.4000 mg | SUBLINGUAL_TABLET | SUBLINGUAL | Status: DC | PRN
  Administered 2019-04-08 (×3): 0.4 mg via SUBLINGUAL
  Filled 2019-04-08 (×3): qty 1

## 2019-04-08 MED ORDER — ISOSORBIDE MONONITRATE ER 60 MG PO TB24
60.0000 mg | ORAL_TABLET | Freq: Every day | ORAL | Status: DC
  Administered 2019-04-08 – 2019-04-11 (×4): 60 mg via ORAL
  Filled 2019-04-08: qty 1
  Filled 2019-04-08: qty 2
  Filled 2019-04-08 (×2): qty 1

## 2019-04-08 MED ORDER — ALFUZOSIN HCL ER 10 MG PO TB24
10.0000 mg | ORAL_TABLET | Freq: Every day | ORAL | Status: DC
  Administered 2019-04-08 – 2019-04-11 (×4): 10 mg via ORAL
  Filled 2019-04-08 (×5): qty 1

## 2019-04-08 MED ORDER — DOCUSATE SODIUM 100 MG PO CAPS
100.0000 mg | ORAL_CAPSULE | Freq: Every day | ORAL | Status: DC
  Administered 2019-04-08 – 2019-04-11 (×4): 100 mg via ORAL
  Filled 2019-04-08 (×4): qty 1

## 2019-04-08 MED ORDER — BISACODYL 10 MG RE SUPP
10.0000 mg | Freq: Every day | RECTAL | Status: DC | PRN
  Administered 2019-04-10: 09:00:00 10 mg via RECTAL
  Filled 2019-04-08: qty 1

## 2019-04-08 MED ORDER — SODIUM CHLORIDE 0.9 % IV BOLUS (SEPSIS)
1000.0000 mL | Freq: Once | INTRAVENOUS | Status: AC
  Administered 2019-04-08: 03:00:00 1000 mL via INTRAVENOUS

## 2019-04-08 MED ORDER — CLOPIDOGREL BISULFATE 75 MG PO TABS
75.0000 mg | ORAL_TABLET | Freq: Every day | ORAL | Status: DC
  Administered 2019-04-08 – 2019-04-11 (×4): 75 mg via ORAL
  Filled 2019-04-08 (×4): qty 1

## 2019-04-08 MED ORDER — ACETAMINOPHEN 325 MG PO TABS
650.0000 mg | ORAL_TABLET | Freq: Four times a day (QID) | ORAL | Status: DC | PRN
  Administered 2019-04-10 (×2): 650 mg via ORAL
  Filled 2019-04-08 (×2): qty 2

## 2019-04-08 MED ORDER — ONDANSETRON HCL 4 MG/2ML IJ SOLN
4.0000 mg | Freq: Four times a day (QID) | INTRAMUSCULAR | Status: DC | PRN
  Administered 2019-04-09 – 2019-04-10 (×2): 4 mg via INTRAVENOUS
  Filled 2019-04-08 (×2): qty 2

## 2019-04-08 MED ORDER — BETHANECHOL CHLORIDE 5 MG PO TABS
5.0000 mg | ORAL_TABLET | Freq: Three times a day (TID) | ORAL | Status: DC
  Administered 2019-04-08 – 2019-04-11 (×10): 5 mg via ORAL
  Filled 2019-04-08 (×12): qty 1

## 2019-04-08 MED ORDER — DICLOFENAC SODIUM 1 % EX GEL
2.0000 g | Freq: Four times a day (QID) | CUTANEOUS | Status: DC
  Administered 2019-04-08 – 2019-04-10 (×4): 2 g via TOPICAL
  Filled 2019-04-08: qty 100

## 2019-04-08 MED ORDER — ONDANSETRON HCL 4 MG PO TABS: 4.0000 mg | ORAL_TABLET | Freq: Four times a day (QID) | ORAL | Status: DC | PRN

## 2019-04-08 MED ORDER — NITROGLYCERIN 0.4 MG SL SUBL
SUBLINGUAL_TABLET | SUBLINGUAL | Status: AC
  Administered 2019-04-08: 07:00:00 0.4 mg via SUBLINGUAL
  Filled 2019-04-08: qty 1

## 2019-04-08 MED ORDER — PANTOPRAZOLE SODIUM 40 MG PO TBEC
40.0000 mg | DELAYED_RELEASE_TABLET | Freq: Every day | ORAL | Status: DC
  Administered 2019-04-08 – 2019-04-09 (×2): 40 mg via ORAL
  Filled 2019-04-08 (×2): qty 1

## 2019-04-08 MED ORDER — BISACODYL 5 MG PO TBEC: 5.0000 mg | DELAYED_RELEASE_TABLET | Freq: Every day | ORAL | Status: DC | PRN

## 2019-04-08 MED ORDER — ASPIRIN EC 81 MG PO TBEC
81.0000 mg | DELAYED_RELEASE_TABLET | Freq: Every day | ORAL | Status: DC
  Administered 2019-04-08 – 2019-04-11 (×4): 81 mg via ORAL
  Filled 2019-04-08 (×4): qty 1

## 2019-04-08 MED ORDER — RANOLAZINE ER 500 MG PO TB12
1000.0000 mg | ORAL_TABLET | Freq: Two times a day (BID) | ORAL | Status: DC
  Administered 2019-04-08 – 2019-04-11 (×7): 1000 mg via ORAL
  Filled 2019-04-08 (×8): qty 2

## 2019-04-08 MED ORDER — CHLORHEXIDINE GLUCONATE CLOTH 2 % EX PADS
6.0000 | MEDICATED_PAD | Freq: Every day | CUTANEOUS | Status: DC
  Administered 2019-04-08 – 2019-04-09 (×2): 6 via TOPICAL

## 2019-04-08 MED ORDER — ACETAMINOPHEN 650 MG RE SUPP: 650.0000 mg | Freq: Four times a day (QID) | RECTAL | Status: DC | PRN

## 2019-04-08 MED ORDER — LATANOPROST 0.005 % OP SOLN
1.0000 [drp] | Freq: Every day | OPHTHALMIC | Status: DC
  Administered 2019-04-08 – 2019-04-10 (×3): 1 [drp] via OPHTHALMIC
  Filled 2019-04-08: qty 2.5

## 2019-04-08 MED ORDER — ENSURE ENLIVE PO LIQD: 237.0000 mL | Freq: Two times a day (BID) | ORAL | Status: DC

## 2019-04-08 MED ORDER — LINACLOTIDE 145 MCG PO CAPS
290.0000 ug | ORAL_CAPSULE | Freq: Every day | ORAL | Status: DC
  Administered 2019-04-08 – 2019-04-09 (×2): 290 ug via ORAL
  Filled 2019-04-08 (×3): qty 2

## 2019-04-08 MED ORDER — ENOXAPARIN SODIUM 40 MG/0.4ML ~~LOC~~ SOLN
40.0000 mg | SUBCUTANEOUS | Status: DC
  Administered 2019-04-08 – 2019-04-11 (×4): 40 mg via SUBCUTANEOUS
  Filled 2019-04-08 (×4): qty 0.4

## 2019-04-08 MED ORDER — LORAZEPAM 2 MG/ML IJ SOLN
1.0000 mg | Freq: Once | INTRAMUSCULAR | Status: AC
  Administered 2019-04-08: 03:00:00 1 mg via INTRAVENOUS
  Filled 2019-04-08: qty 1

## 2019-04-08 MED ORDER — ALPRAZOLAM 0.5 MG PO TABS
0.5000 mg | ORAL_TABLET | Freq: Two times a day (BID) | ORAL | Status: DC | PRN
  Administered 2019-04-08 – 2019-04-10 (×3): 0.5 mg via ORAL
  Filled 2019-04-08 (×3): qty 1

## 2019-04-08 MED ORDER — NITROGLYCERIN 0.4 MG SL SUBL
SUBLINGUAL_TABLET | SUBLINGUAL | Status: AC
  Filled 2019-04-08: qty 1

## 2019-04-08 MED ORDER — SODIUM CHLORIDE 0.9 % IV SOLN: INTRAVENOUS | Status: DC

## 2019-04-08 MED ORDER — ASPIRIN 81 MG PO CHEW
324.0000 mg | CHEWABLE_TABLET | Freq: Once | ORAL | Status: AC
  Administered 2019-04-08: 04:00:00 324 mg via ORAL
  Filled 2019-04-08: qty 4

## 2019-04-08 NOTE — ED Notes (Addendum)
Toya Smothers, NP, at bedside

## 2019-04-08 NOTE — H&P (Signed)
History and Physical    Austin Malone NTI:[id number] DOB: 03/24/1940 DOA: 04/08/2019  PCP: Galvin Proffer, MD  Patient coming from: Home.  Chief Complaint: Chest pain.  HPI: Austin Malone is a 79 y.o. male with history of CAD status post CABG, chronic indwelling Foley catheter with recurrent UTI on Levaquin for last 2 days, hypertension chronic anemia presents to the ER with complaints of having chest pain.  Patient symptoms started yesterday with multiple episodes of nausea vomiting denies any blood in the vomitus no abdominal pain or diarrhea.  Denies any fever chills had some shortness of breath.  Subsequently in the later evening patient started having chest pressure across the chest.  Patient took 2 sublingual nitroglycerin despite which pain did not improve and came to the ER.  ED Course: In the ER EKG shows ST depression cardiology was notified.  High sensitive troponins were 10 and 12.  Patient did not have any further episodes of vomiting in the ER.  CT renal study shows features concerning for cystitis.  Labs show creatinine 1.5 hemoglobin 10 Covid test pending.  Chest x-ray unremarkable.  Review of Systems: As per HPI, rest all negative.   Past Medical History:  Diagnosis Date  . Anxiety   . Arthritis   . BPH (benign prostatic hyperplasia)   . CAD (coronary artery disease)   . Cataracts, bilateral   . Enlarged prostate   . GERD (gastroesophageal reflux disease)   . Glaucoma   . Hyperlipidemia   . Hypertension   . Non-ST elevation (NSTEMI) myocardial infarction (HCC) 08/21/2017  . Pneumonia   . PUD (peptic ulcer disease)   . Rotator cuff tear     Past Surgical History:  Procedure Laterality Date  . APPENDECTOMY    . COLONOSCOPY WITH PROPOFOL N/A 02/21/2018   Procedure: COLONOSCOPY WITH PROPOFOL;  Surgeon: Sherrilyn Rist, MD;  Location: [facility] ENDOSCOPY;  Service: Gastroenterology;  Laterality: N/A;  . CORONARY ANGIOPLASTY WITH STENT PLACEMENT  11/2014   PCI of SVG  to PDA x 2 with Xience DES to proximal stenosis, Ultra 5.0 BMS to distal thrombotic lesion  . CORONARY ARTERY BYPASS GRAFT  2008  . CORONARY ATHERECTOMY N/A 02/01/2018   Procedure: CORONARY ATHERECTOMY;  Surgeon: Yvonne Kendall, MD;  Location: [facility] INVASIVE CV LAB;  Service: Cardiovascular;  Laterality: N/A;  . CORONARY STENT INTERVENTION N/A 02/01/2018   Procedure: CORONARY STENT INTERVENTION;  Surgeon: Yvonne Kendall, MD;  Location: [facility] INVASIVE CV LAB;  Service: Cardiovascular;  Laterality: N/A;  . ESOPHAGOGASTRODUODENOSCOPY (EGD) WITH PROPOFOL N/A 02/20/2018   Procedure: ESOPHAGOGASTRODUODENOSCOPY (EGD) WITH PROPOFOL;  Surgeon: Sherrilyn Rist, MD;  Location: [facility] ENDOSCOPY;  Service: Gastroenterology;  Laterality: N/A;  . EYE SURGERY    . INTRAVASCULAR ULTRASOUND/IVUS N/A 02/01/2018   Procedure: Intravascular Ultrasound/IVUS;  Surgeon: Yvonne Kendall, MD;  Location: [facility] INVASIVE CV LAB;  Service: Cardiovascular;  Laterality: N/A;  . LEFT HEART CATH AND CORS/GRAFTS ANGIOGRAPHY N/A 08/21/2017   Procedure: LEFT HEART CATH AND CORS/GRAFTS ANGIOGRAPHY;  Surgeon: Yvonne Kendall, MD;  Location: [facility] INVASIVE CV LAB;  Service: Cardiovascular;  Laterality: N/A;  . LEFT HEART CATH AND CORS/GRAFTS ANGIOGRAPHY N/A 02/01/2018   Procedure: LEFT HEART CATH AND CORS/GRAFTS ANGIOGRAPHY;  Surgeon: Yvonne Kendall, MD;  Location: [facility] INVASIVE CV LAB;  Service: Cardiovascular;  Laterality: N/A;  . LEFT HEART CATH AND CORS/GRAFTS ANGIOGRAPHY N/A 05/24/2018   Procedure: LEFT HEART CATH AND CORS/GRAFTS ANGIOGRAPHY;  Surgeon: Runell Gess, MD;  Location: [facility] INVASIVE CV LAB;  Service: Cardiovascular;  Laterality: N/A;  . LEG SURGERY    . TONSILLECTOMY       reports that he has quit smoking. He has never used smokeless tobacco. He reports current alcohol use of about 21.0 standard drinks of alcohol per week. He reports current drug use. Drug: Marijuana.  Allergies  Allergen Reactions  . Codeine Other (See  Comments)    unknown  . Lisinopril Cough  . Contrast Media [Iodinated Diagnostic Agents] Hives and Rash    Family History  Problem Relation Age of Onset  . Diabetes Mother   . Heart attack Brother     Prior to Admission medications   Medication Sig Start Date End Date Taking? Authorizing Provider  alfuzosin (UROXATRAL) 10 MG 24 hr tablet TAKE 1 TABLET BY MOUTH ONCE (1) DAILY FOR bph 06/21/18   [provider]  ALPRAZolam Duanne Moron) 0.5 MG tablet Take 0.5 mg by mouth 2 (two) times daily as needed for anxiety or sleep.  05/10/14   [provider]  amoxicillin (AMOXIL) 875 MG tablet  08/30/18   [provider]  aspirin EC 81 MG tablet Take 81 mg by mouth daily.    [provider]  bethanechol (URECHOLINE) 50 MG tablet TAKE 1 TABLET BY MOUTH THREE (3) TIMES DAILY 07/26/18   [provider]  bisacodyl (DULCOLAX) 5 MG EC tablet Take by mouth.    [provider]  carvedilol (COREG) 6.25 MG tablet Take 1 tablet by mouth 2 (two) times daily. 09/01/16   [provider]  Cayenne 450 MG CAPS Take by mouth.    [provider]  cefdinir (OMNICEF) 300 MG capsule Take 300 mg by mouth 2 (two) times daily. 11/12/18   [provider]  Cholecalciferol (VITAMIN D-1000 MAX ST) 25 MCG (1000 UT) tablet Take by mouth 3 (three) times daily.    [provider]  clopidogrel (PLAVIX) 75 MG tablet TAKE 1 TABLET BY MOUTH ONCE (1) DAILY 11/16/18   Revankar, Reita Cliche, MD  clotrimazole-betamethasone (LOTRISONE) cream APPLY TO THE AFFECTED AREA(S) TWICE (2) DAILY 08/23/18   [provider]  Coenzyme Q10 100 MG TABS Take 200 mg by mouth daily.    [provider]  docusate sodium (COLACE) 100 MG capsule Take 100 mg by mouth daily.    [provider]  dorzolamide-timolol (COSOPT) 22.3-6.8 MG/ML ophthalmic solution Place 1 drop into both eyes 2 (two) times daily. 01/29/18   [provider]  ezetimibe (ZETIA) 10 MG  tablet TAKE 1 TABLET BY MOUTH ONCE (1) DAILY 01/13/19   Kathyrn Drown D, NP  finasteride (PROSCAR) 5 MG tablet Take 1 tablet (5 mg total) by mouth daily. 02/02/18   Rai, Vernelle Emerald, MD  fluconazole (DIFLUCAN) 100 MG tablet Take 100 mg by mouth every 3 (three) days. 11/15/18   [provider]  furosemide (LASIX) 20 MG tablet TAKE 1 TABLET BY MOUTH ONCE (1) DAILY 08/13/18   [provider]  isosorbide mononitrate (IMDUR) 60 MG 24 hr tablet Take 1 tablet (60 mg total) by mouth daily. 02/02/18   Rai, Ripudeep Raliegh Ip, MD  levofloxacin (LEVAQUIN) 750 MG tablet TAKE 1 TABLET BY MOUTH ONCE (1) DAILY FOR 7 DAYS 07/26/18   [provider]  linaclotide (LINZESS) 290 MCG CAPS capsule Take 290 mcg by mouth daily before breakfast.    [provider]  Multiple Vitamins-Minerals (ZINC PO) Take 75 mg by mouth daily.    [provider]  nitrofurantoin, macrocrystal-monohydrate, (MACROBID) 100 MG capsule  Take 100 mg by mouth 2 (two) times daily. 11/15/18   [provider]  nitroGLYCERIN (NITROSTAT) 0.4 MG SL tablet DISSOLVE 1 TABLET UNDER THE TONGUE EVERY 5 MINUTES AS NEEDED FOR CHEST PAIN. (MAXIMUM OF 3 DOSES). call 911 IF not better 03/01/19   Revankar, Aundra Dubin, MD  nystatin cream (MYCOSTATIN) APPLY TO THE AFFECTED AREA(S) OF penis/foreskin 3 TO 4 TIMES a DAY FOR yeast infection 11/12/18   [provider]  ondansetron (ZOFRAN) 8 MG tablet Take 8 mg by mouth 2 (two) times daily.     [provider]  pantoprazole (PROTONIX) 40 MG tablet Take 40 mg by mouth daily.     [provider]  ranolazine (RANEXA) 1000 MG SR tablet Take 1,000 mg by mouth 2 (two) times daily.  02/25/16   [provider]  TRAVATAN Z 0.004 % SOLN ophthalmic solution Place 1 drop into both eyes at bedtime.  06/24/17   [provider]    Physical Exam: Constitutional: Moderately built and nourished. Vitals:   04/08/19 0415 04/08/19 0430 04/08/19 0445 04/08/19  0500  BP: (!) 170/71 (!) 181/78 (!) 155/68 (!) 166/78  Pulse: 64 70 66 67  Resp: 18 16 18 13   Temp:      TempSrc:      SpO2: 99% 100% 100% 100%  Weight:      Height:       Eyes: Anicteric no pallor. ENMT: No discharge from the ears eyes nose or mouth. Neck: No mass felt.  No neck rigidity. Respiratory: No rhonchi or crepitations. Cardiovascular: S1-S2 heard. Abdomen: Soft nontender bowel sound present. Musculoskeletal: No edema. Skin: No rash. Neurologic: Alert awake oriented to time place and person.  Moves all extremities. Psychiatric: Appears normal with normal affect.   Labs on Admission: I have personally reviewed following labs and imaging studies  CBC: Recent Labs  Lab 04/08/19 0107  WBC 5.8  NEUTROABS 3.8  HGB 10.4*  HCT 31.7*  MCV 100.6*  PLT 208   Basic Metabolic Panel: Recent Labs  Lab 04/08/19 0107  NA 136  K 3.8  CL 105  CO2 19*  GLUCOSE 101*  BUN 16  CREATININE 1.58*  CALCIUM 8.3*   GFR: Estimated Creatinine Clearance: 38.5 mL/min (A) (by C-G formula based on SCr of 1.58 mg/dL (H)). Liver Function Tests: Recent Labs  Lab 04/08/19 0107  AST 26  ALT 15  ALKPHOS 38  BILITOT 0.6  PROT 6.0*  ALBUMIN 3.3*   Recent Labs  Lab 04/08/19 0107  LIPASE 20   No results for input(s): AMMONIA in the last 168 hours. Coagulation Profile: No results for input(s): INR, PROTIME in the last 168 hours. Cardiac Enzymes: No results for input(s): CKTOTAL, CKMB, CKMBINDEX, TROPONINI in the last 168 hours. BNP (last 3 results) No results for input(s): PROBNP in the last 8760 hours. HbA1C: No results for input(s): HGBA1C in the last 72 hours. CBG: No results for input(s): GLUCAP in the last 168 hours. Lipid Profile: No results for input(s): CHOL, HDL, LDLCALC, TRIG, CHOLHDL, LDLDIRECT in the last 72 hours. Thyroid Function Tests: No results for input(s): TSH, T4TOTAL, FREET4, T3FREE, THYROIDAB in the last 72 hours. Anemia Panel: No results for  input(s): VITAMINB12, FOLATE, FERRITIN, TIBC, IRON, RETICCTPCT in the last 72 hours. Urine analysis:    Component Value Date/Time   COLORURINE YELLOW 05/23/2018 1555   APPEARANCEUR CLEAR 05/23/2018 1555   LABSPEC 1.006 05/23/2018 1555   PHURINE 6.0 05/23/2018 1555   GLUCOSEU NEGATIVE 05/23/2018 1555  HGBUR NEGATIVE 05/23/2018 1555   BILIRUBINUR NEGATIVE 05/23/2018 1555   KETONESUR NEGATIVE 05/23/2018 1555   PROTEINUR NEGATIVE 05/23/2018 1555   NITRITE NEGATIVE 05/23/2018 1555   LEUKOCYTESUR SMALL (A) 05/23/2018 1555   Sepsis Labs: @LABRCNTIP (procalcitonin:4,lacticidven:4) )No results found for this or any previous visit (from the past 240 hour(s)).   Radiological Exams on Admission: DG Chest Port 1 View  Result Date: 04/08/2019 CLINICAL DATA:  Chest pain EXAM: PORTABLE CHEST 1 VIEW COMPARISON:  January 09, 2019 FINDINGS: The heart size and mediastinal contours are within normal limits. Aortic knob calcifications and overlying median sternotomy wires. Tiny calcified granulomata seen within the left lower lung. Otherwise the lungs are clear. The visualized skeletal structures are unremarkable. IMPRESSION: No active disease. Electronically Signed   By: January 11, 2019 M.D.   On: 04/08/2019 01:39    EKG: Independently reviewed.  Normal sinus rhythm with ST depression inferolateral leads.  Assessment/Plan Principal Problem:   Chest pain Active Problems:   Hx of CABG   Essential hypertension   Nausea & vomiting   CKD (chronic kidney disease) stage 3, GFR 30-59 ml/min   Recurrent UTI    1. Chest pain with history of CAD status post CABG with EKG showing some ST depression in the lateral leads inferior leads.  Presently chest pain-free.  We will continue antiplatelet agents beta-blockers Zetia Ranexa Imdur and as needed nitroglycerin.  Cardiology has been notified. 2. Intractable nausea vomiting CT scan renal stone study shows cystitis likely causing the vomiting.  No further episodes  in the ER.  Patient was recently on Levaquin will check UA urine culture.  On ceftriaxone. 3. Hypertension uncontrolled we will continue Imdur Coreg and place patient on as needed IV hydralazine. 4. Acute renal failure likely from vomiting.  Will hold off Lasix for now and follow metabolic panel. 5. History of pericarditis last year in May 2020. 6. Chronic indwelling Foley catheter presently having symptoms of UTI. 7. Chronic anemia follow CBC.  Covid test is pending.  Home medications needs to be reconciled.   DVT prophylaxis: Lovenox. Code Status: DNR. Family Communication: Discussed with patient. Disposition Plan: Home. Consults called: Cardiology. Admission status: Observation.   June 2020 MD [facility] Pager [phone].  If 7PM-7AM, please contact night-coverage [email or web address] Password [facility]  04/08/2019, 5:37 AM

## 2019-04-08 NOTE — Consult Note (Addendum)
Cardiology Consultation:   Patient ID: Austin Malone MRN: [id number]; DOB: Apr 15, 1940  Admit date: 04/08/2019 Date of Consult: 04/08/2019  Primary Care Provider: Galvin Proffer, MD Primary Cardiologist: Garwin Brothers, MD   Patient Profile:   Austin Malone is a 79 y.o. male with a hx of CAD s/p CABG and multiple PCI, hypertension, peptic ulcer disease, recurrent UTIs with chronic Foley catheter in place and hyperlipidemia who is being seen today for the evaluation of chest pain at the request of Dr. Benjamine Mola.  Hx ofCAD s/p CABG with graft stenosis and prior PCIs to VG to PDA and chronically occluded VG to diag and VG to OM and patent LIMA,attempt to manage medically but pt with increasing angina so 02/01/18 had PCI to [facility] with orbital atherectomy and DES and PCI to dRCA and rPL with DES. Echocardiogram on 02/21/2018 showed normal LV systolic function with EF 60-65%, trivial pericardial effusion.  Last cath 05/2018 showed unchanged anatomy as below. No culprit lesion. Continued DAPT with ASA and Plavix. Diagnostic Dominance: Right   Last seen by Dr. Tomie China 11/2018.  History of Present Illness:   Austin Malone reports history of recurrent UTI and being on multiple antibiotics.  For the past 3 days he has intermittent nausea leading to vomiting.  Multiple episodes per day.  Yesterday he had first day and decided to come in.  Apart from this patient reports intermittent upper chest "dull achy" pain.  Similar to prior intervention.  His chest pains occurs mostly at laying down at night.  Sometimes occur multiple times per night and goes without any symptoms for few night as well.  Resolved with nitroglycerin each time.  He walks 30 minutes each day but no chest pain at that time.  Last night with his worsening vomiting he had a recurrent chest pain and took sublingual nitroglycerin with reevaluation and came to ER for further evaluation.  His chest pain has been ongoing for months.  He uses  keto diet for years.  Creatinine 1.58>> 1.64.  CT of renal stone study shows cystitis.  Home Levaquin changed to Rocephin.  His nausea and vomiting has resolved since admit.  He had a recurrent chest pain this morning and required multiple nitroglycerin with eventually reevaluation.  Currently chest pain-free. HS troponin 10>>12>>16>>22.     Past Medical History:  Diagnosis Date  . Anxiety   . Arthritis   . BPH (benign prostatic hyperplasia)   . CAD (coronary artery disease)   . Cataracts, bilateral   . Enlarged prostate   . GERD (gastroesophageal reflux disease)   . Glaucoma   . Hyperlipidemia   . Hypertension   . Non-ST elevation (NSTEMI) myocardial infarction (HCC) 08/21/2017  . Pneumonia   . PUD (peptic ulcer disease)   . Rotator cuff tear     Past Surgical History:  Procedure Laterality Date  . APPENDECTOMY    . COLONOSCOPY WITH PROPOFOL N/A 02/21/2018   Procedure: COLONOSCOPY WITH PROPOFOL;  Surgeon: Sherrilyn Rist, MD;  Location: [facility] ENDOSCOPY;  Service: Gastroenterology;  Laterality: N/A;  . CORONARY ANGIOPLASTY WITH STENT PLACEMENT  11/2014   PCI of SVG to PDA x 2 with Xience DES to proximal stenosis, Ultra 5.0 BMS to distal thrombotic lesion  . CORONARY ARTERY BYPASS GRAFT  2008  . CORONARY ATHERECTOMY N/A 02/01/2018   Procedure: CORONARY ATHERECTOMY;  Surgeon: Yvonne Kendall, MD;  Location: [facility] INVASIVE CV LAB;  Service: Cardiovascular;  Laterality: N/A;  . CORONARY STENT INTERVENTION N/A 02/01/2018  Procedure: CORONARY STENT INTERVENTION;  Surgeon: Yvonne Kendall, MD;  Location: [facility] INVASIVE CV LAB;  Service: Cardiovascular;  Laterality: N/A;  . ESOPHAGOGASTRODUODENOSCOPY (EGD) WITH PROPOFOL N/A 02/20/2018   Procedure: ESOPHAGOGASTRODUODENOSCOPY (EGD) WITH PROPOFOL;  Surgeon: Sherrilyn Rist, MD;  Location: [facility] ENDOSCOPY;  Service: Gastroenterology;  Laterality: N/A;  . EYE SURGERY    . INTRAVASCULAR ULTRASOUND/IVUS N/A 02/01/2018   Procedure: Intravascular  Ultrasound/IVUS;  Surgeon: Yvonne Kendall, MD;  Location: [facility] INVASIVE CV LAB;  Service: Cardiovascular;  Laterality: N/A;  . LEFT HEART CATH AND CORS/GRAFTS ANGIOGRAPHY N/A 08/21/2017   Procedure: LEFT HEART CATH AND CORS/GRAFTS ANGIOGRAPHY;  Surgeon: Yvonne Kendall, MD;  Location: [facility] INVASIVE CV LAB;  Service: Cardiovascular;  Laterality: N/A;  . LEFT HEART CATH AND CORS/GRAFTS ANGIOGRAPHY N/A 02/01/2018   Procedure: LEFT HEART CATH AND CORS/GRAFTS ANGIOGRAPHY;  Surgeon: Yvonne Kendall, MD;  Location: [facility] INVASIVE CV LAB;  Service: Cardiovascular;  Laterality: N/A;  . LEFT HEART CATH AND CORS/GRAFTS ANGIOGRAPHY N/A 05/24/2018   Procedure: LEFT HEART CATH AND CORS/GRAFTS ANGIOGRAPHY;  Surgeon: Runell Gess, MD;  Location: [facility] INVASIVE CV LAB;  Service: Cardiovascular;  Laterality: N/A;  . LEG SURGERY    . TONSILLECTOMY      Inpatient Medications: Scheduled Meds: . alfuzosin  10 mg Oral Q breakfast  . aspirin EC  81 mg Oral Daily  . bethanechol  5 mg Oral TID  . carvedilol  6.25 mg Oral BID  . clopidogrel  75 mg Oral Daily  . docusate sodium  100 mg Oral Daily  . enoxaparin (LOVENOX) injection  40 mg Subcutaneous Q24H  . ezetimibe  10 mg Oral Daily  . finasteride  5 mg Oral Daily  . isosorbide mononitrate  60 mg Oral Daily  . latanoprost  1 drop Both Eyes QHS  . linaclotide  290 mcg Oral QAC breakfast  . nitroGLYCERIN      . pantoprazole  40 mg Oral Daily  . ranolazine  1,000 mg Oral BID   Continuous Infusions: . sodium chloride 50 mL/hr at 04/08/19 1017  . cefTRIAXone (ROCEPHIN)  IV Stopped (04/08/19 0859)   PRN Meds: acetaminophen **OR** acetaminophen, ALPRAZolam, bisacodyl, hydrALAZINE, morphine injection, nitroGLYCERIN, ondansetron **OR** ondansetron (ZOFRAN) IV  Allergies:    Allergies  Allergen Reactions  . Codeine Other (See Comments)    unknown  . Lisinopril Cough  . Contrast Media [Iodinated Diagnostic Agents] Hives and Rash    Social History:   Social  History   Socioeconomic History  . Marital status: Widowed    Spouse name: Not on file  . Number of children: Not on file  . Years of education: Not on file  . Highest education level: Not on file  Occupational History  . Occupation: Retired  Tobacco Use  . Smoking status: Former Games developer  . Smokeless tobacco: Never Used  Substance and Sexual Activity  . Alcohol use: Yes    Alcohol/week: 21.0 standard drinks    Types: 21 Cans of beer per week  . Drug use: Yes    Types: Marijuana  . Sexual activity: Not on file  Other Topics Concern  . Not on file  Social History Narrative   He lives in [place], [place] alone.   Social Determinants of Health   Financial Resource Strain:   . Difficulty of Paying Living Expenses:   Food Insecurity:   . Worried About Programme researcher, broadcasting/film/video in the Last Year:   . [employer] of Food in the Last Year:  Transportation Needs:   . Film/video editor (Medical):   Marland Kitchen Lack of Transportation (Non-Medical):   Physical Activity:   . Days of Exercise per Week:   . Minutes of Exercise per Session:   Stress:   . Feeling of Stress :   Social Connections:   . Frequency of Communication with Friends and Family:   . Frequency of Social Gatherings with Friends and Family:   . Attends Religious Services:   . Active Member of Clubs or Organizations:   . Attends Archivist Meetings:   Marland Kitchen Marital Status:   Intimate Partner Violence:   . Fear of Current or Ex-Partner:   . Emotionally Abused:   Marland Kitchen Physically Abused:   . Sexually Abused:     Family History:   Family History  Problem Relation Age of Onset  . Diabetes Mother   . Heart attack Brother      ROS:  Please see the history of present illness.  All other ROS reviewed and negative.     Physical Exam/Data:   Vitals:   04/08/19 0745 04/08/19 0836 04/08/19 0930 04/08/19 1100  BP: (!) 160/80 (!) 155/66 (!) 142/63 133/75  Pulse: 71 73 68 65  Resp: 17 (!) 21 17 13   Temp:        TempSrc:      SpO2: 97% 100% 98% 98%  Weight:      Height:        Intake/Output Summary (Last 24 hours) at 04/08/2019 1115 Last data filed at 04/08/2019 0859 Gross per 24 hour  Intake 100 ml  Output --  Net 100 ml   Last 3 Weights 04/08/2019 11/17/2018 09/17/2018  Weight (lbs) 180 lb 184 lb 189 lb  Weight (kg) 81.647 kg 83.462 kg 85.73 kg     Body mass index is 25.83 kg/m.  General:  Well nourished, well developed, in no acute distress HEENT: normal Lymph: no adenopathy Neck: no JVD Endocrine:  No thryomegaly Vascular: No carotid bruits; FA pulses 2+ bilaterally without bruits  Cardiac:  normal S1, S2; RRR; no murmur  Lungs:  clear to auscultation bilaterally, no wheezing, rhonchi or rales  Abd: soft, nontender, no hepatomegaly  Ext: no edema Musculoskeletal:  No deformities, BUE and BLE strength normal and equal Skin: warm and dry  Neuro:  CNs 2-12 intact, no focal abnormalities noted Psych:  Normal affect   EKG:  The EKG was personally reviewed and demonstrates: Sinus rhythm with anterior lateral ST depression which seems more pronounced than prior Telemetry:  Telemetry was personally reviewed and demonstrates: Normal sinus rhythm  Relevant CV Studies:   ECho 02/21/18 1. The left ventricle has normal systolic function of 54-00%. The cavity  size was normal. There is no increased left ventricular wall thickness.  2. The right ventricle has normal systolic function. The cavity was  normal . There is no increase in right ventricular wall thickness.  3. Trivial pericardial effusion.  4. The pericardial effusion is anterior to the right ventricle.  5. Trivial pericardial effusion. No septal shudder, normal size IVC with  normal collapse, no definite mitral or tricuspid inflow variation. No  findings to suggest constrictive physiology.   Laboratory Data:  High Sensitivity Troponin:   Recent Labs  Lab 04/08/19 0107 04/08/19 0307 04/08/19 0638 04/08/19 0828   TROPONINIHS 10 12 16  22*     Chemistry Recent Labs  Lab 04/08/19 0107 04/08/19 0638  NA 136  --   K 3.8  --   CL  105  --   CO2 19*  --   GLUCOSE 101*  --   BUN 16  --   CREATININE 1.58* 1.64*  CALCIUM 8.3*  --   GFRNONAA 41* 39*  GFRAA 47* 45*  ANIONGAP 12  --     Recent Labs  Lab 04/08/19 0107  PROT 6.0*  ALBUMIN 3.3*  AST 26  ALT 15  ALKPHOS 38  BILITOT 0.6   Hematology Recent Labs  Lab 04/08/19 0107 04/08/19 0638  WBC 5.8 5.8  RBC 3.15* 3.14*  HGB 10.4* 10.3*  HCT 31.7* 31.1*  MCV 100.6* 99.0  MCH 33.0 32.8  MCHC 32.8 33.1  RDW 13.8 13.8  PLT 208 202    Radiology/Studies:  DG Chest Port 1 View  Result Date: 04/08/2019 CLINICAL DATA:  Chest pain EXAM: PORTABLE CHEST 1 VIEW COMPARISON:  January 09, 2019 FINDINGS: The heart size and mediastinal contours are within normal limits. Aortic knob calcifications and overlying median sternotomy wires. Tiny calcified granulomata seen within the left lower lung. Otherwise the lungs are clear. The visualized skeletal structures are unremarkable. IMPRESSION: No active disease. Electronically Signed   By: Jonna Clark M.D.   On: 04/08/2019 01:39   CT RENAL STONE STUDY  Result Date: 04/08/2019 CLINICAL DATA:  Flank pain with kidney stone suspected. Vomiting and episode of diarrhea. EXAM: CT ABDOMEN AND PELVIS WITHOUT CONTRAST TECHNIQUE: Multidetector CT imaging of the abdomen and pelvis was performed following the standard protocol without IV contrast. COMPARISON:  07/22/2017 FINDINGS: Lower chest:  Coronary atherosclerosis. Hepatobiliary: No focal liver abnormality.No evidence of biliary obstruction or stone. Pancreas: Unremarkable. Spleen: Unremarkable. Adrenals/Urinary Tract: Negative adrenals. No hydronephrosis or ureteral stone. Small cystic density is suggested at the lower pole left kidney. The bladder is decompressed by a Foley catheter. The bladder has an indistinct wall with prominent thickness. High-density within  the bladder appears crescentic and not typical of a stone. Stomach/Bowel:  No obstruction. No visible inflammation Vascular/Lymphatic: Diffuse atherosclerotic calcification of the aorta and iliacs. No mass or adenopathy. Reproductive:No pathologic findings. Possible lipoma along the lower right spermatic cord, but only partially covered. No distension of the right upper inguinal canal to suggest this reflects a fatty hernia. Other: No ascites or pneumoperitoneum. Musculoskeletal: No acute abnormalities. Spondylosis and degenerative facet spurring IMPRESSION: 1. No acute finding.  No hydronephrosis or ureteral calculus. 2. Indistinct bladder wall, please correlate for cystitis symptoms. 3. History of vomiting and diarrhea. No visible bowel inflammation or obstruction. Electronically Signed   By: Marnee Spring M.D.   On: 04/08/2019 06:00    HEAR Score (for undifferentiated chest pain):     5  Assessment and Plan:   1. Chest pain Ongoing for past few months.  His chest pain occurs mostly at night while laying down.  Resolved with nitroglycerin each time.  However he walks 30 minutes each day without reproducible symptoms.  Reports symptoms similar to prior PCI.  Last cath May 2020 as above. He got multiple nitro in ER as well.  -  HS troponin 10>>12>>16>>22.    EKG with more ST depression.  Due to August 2020 however similar to EKG 06/02/2018 and 02/01/2018 when he had a cath.  2.  CAD s/p CABG and PCI's -Maintain on dual antiplatelet therapy with aspirin and Plavix -On Ranexa 1000 mg twice daily, Imdur 60 mg daily, Zetia 10 mg daily, and Coreg 6.25 mg twice daily  3.  Hyperlipidemia -Statin intolerance -On Zetia 10 mg daily -LDL 109 on November  2020  4.  Recurrent UTI -On chronic indwelling catheter -CT renal stone study showed cystitis -Antibiotic change per primary team  5.  Intractable nausea and vomiting -Reports improvement since here -He is on a keto diet  6. AKI - Scr 1.58>>1.64 -  On hydration  7. Chronic Anemia - Hgb stable   Dr. Paxton Kanaan to see.   For questions or updates, please contact [facility] consult [email or web address] for contact info under   SignedManson Passey, Bhavinkumar Bhagat, PA  04/08/2019 11:15 AM   I have personally seen and examined this patient. I agree with the assessment and plan as outlined above.  He is a pleasant 79 yo male with history of CAD s/p CABG, chronic UTI, HTN and HLD admitted with N/V/weakness and found to have acute renal failure. He notes ongoing resting chest pain for years with no real change. His pain is often responsive to NTG. He walks 30 minutes per day with no chest pain. [facility] with [facility] pain. Cardiac cath May 2020 with stable CAD. Ad[facility] December 2020 with chest pain and his Imdur was adjusted.  EKG reviewed by me and shows sinus rhythm with slight ST depression in the anterolateral leads that was present on his EKG in May 2020. Labs reviewed by me. Troponin negative x 3.  My exam:  General: Well developed, well nourished, NAD  HEENT: OP clear, mucus membranes moist  SKIN: warm, dry. No rashes. Neuro: No focal deficits  Musculoskeletal: Muscle strength 5/5 all ext  Psychiatric: Mood and affect normal  Neck: No JVD, no carotid bruits, no thyromegaly, no lymphadenopathy.  Lungs:Clear bilaterally, no wheezes, rhonci, crackles Cardiovascular: Regular rate and rhythm. No murmurs, gallops or rubs. Abdomen:Soft. Bowel sounds present. Non-tender.  Extremities: No lower extremity edema. Pulses are 2 + in the bilateral DP/PT.  Plan: Chest pain/chronic stable angina: mostly atypical features, no change in character over the past year. This likely represents his chronic stable angina. Troponin and negative and his EKG is similar to the EKG in may 2020. I do not think an ischemic workup is necessary. Continue current therapy. He is most interested in having the cause of his N/V isolated.  He thinks this is due to his chronic UTI. I do not think his N/V are cardiac related. We will sign off. Please call with questions.   Verne CarrowChristopher Harini Dearmond 04/08/2019 12:15 PM

## 2019-04-08 NOTE — ED Provider Notes (Signed)
[facility] Provider Note   CSN: [id number] Arrival date & time: 04/08/19  0035     History Chief Complaint  Patient presents with  . Emesis    Ival BibleJames Sindoni is a 79 y.o. male.  The history is provided by the patient.  Emesis Severity:  Moderate Duration:  3 days Timing:  Intermittent Progression:  Worsening Chronicity:  New Relieved by:  Nothing Worsened by:  Nothing Associated symptoms: no abdominal pain and no fever   Patient with history of CAD, alcohol abuse, presents with vomiting.  He reports for the past 3 days has had episodes of nonbloody emesis.  He had an episode of loose stool in the past 24 hours it was nonbloody and not black.  No abdominal pain.  No fevers.  He reports that approximately 2100 on March 25 he did have a 5-minute episode of chest pain that resolved with nitroglycerin.  He reports he gets episodes of chest pain frequently due to his CAD Patient has a chronic indwelling Foley catheter, recently placed on Levaquin for recurrent UTI    Past Medical History:  Diagnosis Date  . Anxiety   . Arthritis   . BPH (benign prostatic hyperplasia)   . CAD (coronary artery disease)   . Cataracts, bilateral   . Enlarged prostate   . GERD (gastroesophageal reflux disease)   . Glaucoma   . Hyperlipidemia   . Hypertension   . Non-ST elevation (NSTEMI) myocardial infarction (HCC) 08/21/2017  . Pneumonia   . PUD (peptic ulcer disease)   . Rotator cuff tear     Patient Active Problem List   Diagnosis Date Noted  . Preoperative cardiovascular examination 09/17/2018  . Chest pain 05/23/2018  . Essential hypertension 03/24/2018  . Upper GI bleed 02/19/2018  . Symptomatic anemia 02/19/2018  . AKI (acute kidney injury) (HCC) 02/19/2018  . Pericarditis 02/03/2018  . Chronic diastolic CHF (congestive heart failure) (HCC) 01/31/2018  . Chest pain at rest 09/29/2017  . Pre-operative clearance 09/08/2017  . Alcohol abuse  08/22/2017  . Anxiety 08/22/2017  . CAD (coronary artery disease) 08/18/2017  . BPH (benign prostatic hyperplasia) 07/21/2017  . Gastroesophageal reflux disease without esophagitis 07/21/2017  . 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 Hyperlipidemia 07/21/2017  . 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 Gallbladder sludge 04/01/2015    Past Surgical History:  Procedure Laterality Date  . APPENDECTOMY    . COLONOSCOPY WITH PROPOFOL N/A 02/21/2018   Procedure: COLONOSCOPY WITH PROPOFOL;  Surgeon: Sherrilyn Ristanis, Henry L III, MD;  [facility];  Service: Gastroenterology;  Laterality: N/A;  . CORONARY ANGIOPLASTY WITH STENT PLACEMENT  11/2014   PCI of SVG to PDA x 2 with Xience DES to proximal stenosis, Ultra 5.0 BMS to distal thrombotic lesion  . CORONARY ARTERY BYPASS GRAFT  2008  . CORONARY ATHERECTOMY N/A 02/01/2018   Procedure: CORONARY ATHERECTOMY;  Surgeon: Yvonne KendallEnd, Christopher, MD;  Location: [facility] INVASIVE CV LAB;  Service: Cardiovascular;  Laterality: N/A;  . CORONARY STENT INTERVENTION N/A 02/01/2018   Procedure: CORONARY STENT INTERVENTION;  Surgeon: Yvonne KendallEnd, Christopher, MD;  Location: [facility] INVASIVE CV LAB;  Service: Cardiovascular;  Laterality: N/A;  . ESOPHAGOGASTRODUODENOSCOPY (EGD) WITH PROPOFOL N/A 02/20/2018   Procedure: ESOPHAGOGASTRODUODENOSCOPY (EGD) WITH PROPOFOL;  Surgeon: Sherrilyn Ristanis, Henry L III, MD;  [facility];  Service: Gastroenterology;  Laterality: N/A;  . EYE SURGERY    . INTRAVASCULAR ULTRASOUND/IVUS N/A 02/01/2018   Procedure: Intravascular Ultrasound/IVUS;  Surgeon: Yvonne KendallEnd, Christopher, MD;  [facility];  Service: Cardiovascular;  Laterality: N/A;  . LEFT HEART CATH AND CORS/GRAFTS ANGIOGRAPHY N/A 08/21/2017   Procedure: LEFT HEART CATH AND CORS/GRAFTS ANGIOGRAPHY;  Surgeon: Yvonne Kendall, MD;  Location: [facility] INVASIVE CV LAB;  Service: Cardiovascular;  Laterality: N/A;  . LEFT HEART CATH AND CORS/GRAFTS ANGIOGRAPHY N/A 02/01/2018   Procedure: LEFT HEART  CATH AND CORS/GRAFTS ANGIOGRAPHY;  Surgeon: Yvonne Kendall, MD;  Location: [facility] INVASIVE CV LAB;  Service: Cardiovascular;  Laterality: N/A;  . LEFT HEART CATH AND CORS/GRAFTS ANGIOGRAPHY N/A 05/24/2018   Procedure: LEFT HEART CATH AND CORS/GRAFTS ANGIOGRAPHY;  Surgeon: Runell Gess, MD;  Location: [facility] INVASIVE CV LAB;  Service: Cardiovascular;  Laterality: N/A;  . LEG SURGERY    . TONSILLECTOMY         Family History  Problem Relation Age of Onset  . Diabetes Mother   . Heart attack Brother     Social History   Tobacco Use  . Smoking status: Former Games developer  . Smokeless tobacco: Never Used  Substance Use Topics  . Alcohol use: Yes    Alcohol/week: 21.0 standard drinks    Types: 21 Cans of beer per week  . Drug use: Yes    Types: Marijuana    Home Medications Prior to Admission medications   Medication Sig Start Date End Date Taking? Authorizing Provider  alfuzosin (UROXATRAL) 10 MG 24 hr tablet TAKE 1 TABLET BY MOUTH ONCE (1) DAILY FOR bph 06/21/18   [provider]  ALPRAZolam Prudy Feeler) 0.5 MG tablet Take 0.5 mg by mouth 2 (two) times daily as needed for anxiety or sleep.  05/10/14   [provider]  amoxicillin (AMOXIL) 875 MG tablet  08/30/18   [provider]  aspirin EC 81 MG tablet Take 81 mg by mouth daily.    [provider]  bethanechol (URECHOLINE) 50 MG tablet TAKE 1 TABLET BY MOUTH THREE (3) TIMES DAILY 07/26/18   [provider]  bisacodyl (DULCOLAX) 5 MG EC tablet Take by mouth.    [provider]  carvedilol (COREG) 6.25 MG tablet Take 1 tablet by mouth 2 (two) times daily. 09/01/16   [provider]  Cayenne 450 MG CAPS Take by mouth.    [provider]  cefdinir (OMNICEF) 300 MG capsule Take 300 mg by mouth 2 (two) times daily. 11/12/18   [provider]  Cholecalciferol (VITAMIN D-1000 MAX ST) 25 MCG (1000 UT) tablet Take by mouth 3 (three) times daily.    [provider]   clopidogrel (PLAVIX) 75 MG tablet TAKE 1 TABLET BY MOUTH ONCE (1) DAILY 11/16/18   Revankar, Aundra Dubin, MD  clotrimazole-betamethasone (LOTRISONE) cream APPLY TO THE AFFECTED AREA(S) TWICE (2) DAILY 08/23/18   [provider]  Coenzyme Q10 100 MG TABS Take 200 mg by mouth daily.    [provider]  docusate sodium (COLACE) 100 MG capsule Take 100 mg by mouth daily.    [provider]  dorzolamide-timolol (COSOPT) 22.3-6.8 MG/ML ophthalmic solution Place 1 drop into both eyes 2 (two) times daily. 01/29/18   [provider]  ezetimibe (ZETIA) 10 MG tablet TAKE 1 TABLET BY MOUTH ONCE (1) DAILY 01/13/19   Georgie Chard D, NP  finasteride (PROSCAR) 5 MG tablet Take 1 tablet (5 mg total) by mouth daily. 02/02/18   Rai, Delene Ruffini, MD  fluconazole (DIFLUCAN) 100 MG tablet Take 100 mg by mouth every 3 (three) days. 11/15/18   [provider]  furosemide (LASIX) 20  MG tablet TAKE 1 TABLET BY MOUTH ONCE (1) DAILY 08/13/18   [provider]  isosorbide mononitrate (IMDUR) 60 MG 24 hr tablet Take 1 tablet (60 mg total) by mouth daily. 02/02/18   Rai, Ripudeep Kirtland Bouchard, MD  levofloxacin (LEVAQUIN) 750 MG tablet TAKE 1 TABLET BY MOUTH ONCE (1) DAILY FOR 7 DAYS 07/26/18   [provider]  linaclotide (LINZESS) 290 MCG CAPS capsule Take 290 mcg by mouth daily before breakfast.    [provider]  Multiple Vitamins-Minerals (ZINC PO) Take 75 mg by mouth daily.    [provider]  nitrofurantoin, macrocrystal-monohydrate, (MACROBID) 100 MG capsule Take 100 mg by mouth 2 (two) times daily. 11/15/18   [provider]  nitroGLYCERIN (NITROSTAT) 0.4 MG SL tablet DISSOLVE 1 TABLET UNDER THE TONGUE EVERY 5 MINUTES AS NEEDED FOR CHEST PAIN. (MAXIMUM OF 3 DOSES). call 911 IF not better 03/01/19   Revankar, Aundra Dubin, MD  nystatin cream (MYCOSTATIN) APPLY TO THE AFFECTED AREA(S) OF penis/foreskin 3 TO 4 TIMES a DAY FOR yeast infection 11/12/18   [provider]  ondansetron (ZOFRAN) 8 MG tablet Take 8 mg by mouth 2 (two) times daily.     [provider]  pantoprazole (PROTONIX) 40 MG tablet Take 40 mg by mouth daily.     [provider]  ranolazine (RANEXA) 1000 MG SR tablet Take 1,000 mg by mouth 2 (two) times daily.  02/25/16   [provider]  TRAVATAN Z 0.004 % SOLN ophthalmic solution Place 1 drop into both eyes at bedtime.  06/24/17   [provider]    Allergies    Codeine, Lisinopril, and Contrast media [iodinated diagnostic agents]  Review of Systems   Review of Systems  Constitutional: Negative for fever.  Cardiovascular: Positive for chest pain.  Gastrointestinal: Positive for vomiting. Negative for abdominal pain and blood in stool.  All other systems reviewed and are negative.   Physical Exam Updated Vital Signs BP (!) 159/75   Pulse 61   Temp 98.7 F (37.1 C) (Oral)   Resp 15   Ht 1.778 m (5\' 10" )   Wt 81.6 kg   SpO2 100%   BMI 25.83 kg/m   Physical Exam CONSTITUTIONAL: Elderly, no acute distress HEAD: Normocephalic/atraumatic EYES: EOMI/PERRL, mild icterus ENMT: Mucous membranes moist NECK: supple no meningeal signs SPINE/BACK:entire spine nontender CV: S1/S2 noted, no murmurs/rubs/gallops noted LUNGS: Lungs are clear to auscultation bilaterally, no apparent distress ABDOMEN: soft, nontender, no rebound or guarding, bowel sounds noted throughout abdomen GU:no cva tenderness, Foley catheter in place, no signs of trauma, no bleeding noted NEURO: Pt is awake/alert/appropriate, moves all extremitiesx4.  No facial droop.   EXTREMITIES: pulses normal/equal, full ROM SKIN: warm, color normal PSYCH: no abnormalities of mood noted, alert and oriented to situation  ED Results / Procedures / Treatments   Labs (all labs ordered are listed, but only abnormal results are displayed) Labs Reviewed  COMPREHENSIVE METABOLIC PANEL - Abnormal; Notable for the following  components:      Result Value   CO2 19 (*)    Glucose, Bld 101 (*)    Creatinine, Ser 1.58 (*)    Calcium 8.3 (*)    Total Protein 6.0 (*)    Albumin 3.3 (*)    GFR calc non Af Amer 41 (*)    GFR calc Af Amer 47 (*)    All other components within normal limits  CBC WITH DIFFERENTIAL/PLATELET - Abnormal; Notable for the following components:  RBC 3.15 (*)    Hemoglobin 10.4 (*)    HCT 31.7 (*)    MCV 100.6 (*)    All other components within normal limits  LIPASE, BLOOD  ETHANOL  TROPONIN I (HIGH SENSITIVITY)  TROPONIN I (HIGH SENSITIVITY)    EKG EKG Interpretation  Date/Time:  Friday April 08 2019 00:39:44 EDT Ventricular Rate:  64 PR Interval:    QRS Duration: 117 QT Interval:  431 QTC Calculation: 445 R Axis:   -15 Text Interpretation: Sinus rhythm Nonspecific intraventricular conduction delay Nonspecific T abnrm, anterolateral leads Interpretation limited secondary to artifact Confirmed by Zadie Rhine ([id number]) on 04/08/2019 12:49:02 AM   EKG Interpretation  Date/Time:  Friday April 08 2019 04:02:13 EDT Ventricular Rate:  69 PR Interval:    QRS Duration: 113 QT Interval:  438 QTC Calculation: 470 R Axis:   -10 Text Interpretation: Sinus rhythm Borderline intraventricular conduction delay Repol abnrm suggests ischemia, diffuse leads Confirmed by Zadie Rhine ([id number]) on 04/08/2019 4:09:39 AM       Radiology DG Chest Port 1 View  Result Date: 04/08/2019 CLINICAL DATA:  Chest pain EXAM: PORTABLE CHEST 1 VIEW COMPARISON:  January 09, 2019 FINDINGS: The heart size and mediastinal contours are within normal limits. Aortic knob calcifications and overlying median sternotomy wires. Tiny calcified granulomata seen within the left lower lung. Otherwise the lungs are clear. The visualized skeletal structures are unremarkable. IMPRESSION: No active disease. Electronically Signed   By: Jonna Clark M.D.   On: 04/08/2019 01:39    Procedures .Critical Care Performed  by: Zadie Rhine, MD Authorized by: Zadie Rhine, MD   Critical care provider statement:    Critical care time (minutes):  40   Critical care start time:  04/08/2019 4:20 AM   Critical care end time:  04/08/2019 5:00 AM   Critical care time was exclusive of:  Separately billable procedures and treating other patients   Critical care was necessary to treat or prevent imminent or life-threatening deterioration of the following conditions:  Cardiac failure   Critical care was time spent personally by me on the following activities:  Ordering and review of radiographic studies, ordering and review of laboratory studies, pulse oximetry, discussions with consultants, evaluation of patient's response to treatment, examination of patient and review of old charts   I assumed direction of critical care for this patient from another provider in my specialty: no       Medications Ordered in ED Medications  nitroGLYCERIN (NITROSTAT) SL tablet 0.4 mg (0.4 mg Sublingual Given 04/08/19 0428)  sodium chloride 0.9 % bolus 1,000 mL (1,000 mLs Intravenous New Bag/Given 04/08/19 0248)  LORazepam (ATIVAN) injection 1 mg (1 mg Intravenous Given 04/08/19 0306)  aspirin chewable tablet 324 mg (324 mg Oral Given 04/08/19 0400)    ED Course  I have reviewed the triage vital signs and the nursing notes.  Pertinent labs & imaging results that were available during my care of the patient were reviewed by me and considered in my medical decision making (see chart for details).    MDM Rules/Calculators/A&P                      Patient with painless vomiting for the past several days.  Had an episode of chest pain earlier that has since resolved.  No acute EKG changes.  Labs and x-rays pending at this time 5:03 AM Patient with mild dehydration, but overall labs reassuring.  However patient began to have episodes of  chest pain that would resolve with nitroglycerin.  Repeat EKG did reveal some worsening ST  depression.  This was discussed with Dr. Marletta Lor with cardiology.  She has reviewed the chart and the EKGs. She request medical admission and cardiology consultation.  Continue to monitor troponins and echocardiogram. Discussed with Dr. Hal Hope for admission.  Patient has a history of extensive cardiac disease and may not be amenable to intervention.  Patient has been given aspirin and nitroglycerin here, but will defer anticoagulation to admitting team. Final Clinical Impression(s) / ED Diagnoses Final diagnoses:  Unstable angina [facility])    Rx / DC Orders ED Discharge Orders    None       Ripley Fraise, MD 04/08/19 [phone]

## 2019-04-08 NOTE — Progress Notes (Signed)
Progress Note    Austin Malone  YQM:[id number] DOB: 08/28/1940  DOA: 04/08/2019 PCP: Bonnita Nasuti, MD    Brief Narrative:   Chief complaint: chest pain  Medical records reviewed and are as summarized below:  Austin Malone is a very pleasant 79 y.o. male with a past medical history that includes CAD status post CABG 2008, cardiac cath with PCI in January 2020, chronic indwelling Foley catheter with recurrent UTIs on Levaquin for 2 days, hypertension, chronic anemia scented to the emergency department chief complaint of chest pain.  Initial evaluation in the emergency department revealed only slightly elevated troponins and some EKG changes, chest x-ray unremarkable.  Assessment/Plan:   Principal Problem:   Chest pain Active Problems:   Hx of CABG   AKI (acute kidney injury) ([place])   Essential hypertension   Nausea & vomiting   CKD (chronic kidney disease) stage 3, GFR 30-59 ml/min   Recurrent UTI  #1.  Chest pain.  History of CAD status post CABG 2008 and stents 2020. patient continues with intermittent left anterior chest pain.  He was given nitro several times with only brief relief.  This morning he got a dose of morphine and at the time of my exam he reports pain "much better".  He continues with intermittent nausea but has had no vomiting since here. HS troponin 10>>12>>16>>22.  EKG with some ST depression.  Home medications include Imdur, Zetia, Ranexa, beta-blocker.  ED MD note indicates cardiology was consulted and recommended echo admission and cardiology consultation. -Continue aspirin -continue imdur, plavix -Continue as needed morphine -Continue as needed nitro -Supportive therapy -Await cardiology recommendations  #2.  Acute kidney injury superimposed on ckd III.  Likely related to dehydration in the setting of nausea vomiting and Lasix.  Creatinine 1.58 on admission and 1.64 this morning.  Of note he is got a chronic indwelling Foley catheter with recurrent  UTIs. -Gentle IV fluids -Hold nephrotoxins -Monitor urine output -Recheck in the morning  #3.  Recurrent UTI.  CT scan renal stone study shows cystitis patient has a chronic indwelling catheter.  Recently on Levaquin.  Rocephin initiated in the emergency department.  He is afebrile hemodynamically stable and nontoxic-appearing -Get urine culture -Continue Rocephin - Adjust antibiotics as indicated  #4.  Nausea and vomiting.  Likely related to #1 and #3.  Patient reports much improved.  Only intermittent nausea with no vomiting since his presentation. -As needed Zofran  #5.  Hypertension.  Fair control.  Home medications include Coreg, Lasix -Holding Lasix for now secondary to #2 -Continue Coreg -monitor   Family Communication/Anticipated D/C date and plan/Code Status   DVT prophylaxis: Lovenox ordered. Code Status: Full Code.  Family Communication: Patient Disposition Plan: home once cardiology completes work up for chest pain and kidney function improves   Medical Consultants:    cardiology   Anti-Infectives:    None  Subjective:   Awake alert quite pleasant.  Reports chest pain "much better".  Denies nausea.  Objective:    Vitals:   04/08/19 0500 04/08/19 0745 04/08/19 0836 04/08/19 0930  BP: (!) 166/78 (!) 160/80 (!) 155/66 (!) 142/63  Pulse: 67 71 73 68  Resp: 13 17 (!) 21 17  Temp:      TempSrc:      SpO2: 100% 97% 100% 98%  Weight:      Height:        Intake/Output Summary (Last 24 hours) at 04/08/2019 0958 Last data filed at 04/08/2019 0859 Gross per  24 hour  Intake 100 ml  Output --  Net 100 ml   Filed Weights   04/08/19 0041  Weight: 81.6 kg    Exam: General: Slightly pale somewhat chronically ill-appearing no acute distress CV: Regular rate and rhythm no murmur gallop or rub no lower extremity edema Respiratory: No increased work of breathing with conversation breath sounds are clear bilaterally hear no crackles no wheeze Abdomen:  Nondistended soft positive bowel sounds throughout nontender to palpation no mass organomegaly noted Musculoskeletal: Joints without swelling/erythema full range of motion Neuro: Alert and oriented x3 speech clear facial symmetry cranial nerves II through XII grossly intact  Data Reviewed:   I have personally reviewed following labs and imaging studies:  Labs: Labs show the following:   Basic Metabolic Panel: Recent Labs  Lab 04/08/19 0107 04/08/19 0638  NA 136  --   K 3.8  --   CL 105  --   CO2 19*  --   GLUCOSE 101*  --   BUN 16  --   CREATININE 1.58* 1.64*  CALCIUM 8.3*  --    GFR Estimated Creatinine Clearance: 37.1 mL/min (A) (by C-G formula based on SCr of 1.64 mg/dL (H)). Liver Function Tests: Recent Labs  Lab 04/08/19 0107  AST 26  ALT 15  ALKPHOS 38  BILITOT 0.6  PROT 6.0*  ALBUMIN 3.3*   Recent Labs  Lab 04/08/19 0107  LIPASE 20   No results for input(s): AMMONIA in the last 168 hours. Coagulation profile No results for input(s): INR, PROTIME in the last 168 hours.  CBC: Recent Labs  Lab 04/08/19 0107 04/08/19 0638  WBC 5.8 5.8  NEUTROABS 3.8  --   HGB 10.4* 10.3*  HCT 31.7* 31.1*  MCV 100.6* 99.0  PLT 208 202   Cardiac Enzymes: No results for input(s): CKTOTAL, CKMB, CKMBINDEX, TROPONINI in the last 168 hours. BNP (last 3 results) No results for input(s): PROBNP in the last 8760 hours. CBG: Recent Labs  Lab 04/08/19 0637  GLUCAP 100*   D-Dimer: No results for input(s): DDIMER in the last 72 hours. Hgb A1c: No results for input(s): HGBA1C in the last 72 hours. Lipid Profile: No results for input(s): CHOL, HDL, LDLCALC, TRIG, CHOLHDL, LDLDIRECT in the last 72 hours. Thyroid function studies: No results for input(s): TSH, T4TOTAL, T3FREE, THYROIDAB in the last 72 hours.  Invalid input(s): FREET3 Anemia work up: No results for input(s): VITAMINB12, FOLATE, FERRITIN, TIBC, IRON, RETICCTPCT in the last 72 hours. Sepsis  Labs: Recent Labs  Lab 04/08/19 0107 04/08/19 0638  WBC 5.8 5.8    Microbiology No results found for this or any previous visit (from the past 240 hour(s)).  Procedures and diagnostic studies:  DG Chest Port 1 View  Result Date: 04/08/2019 CLINICAL DATA:  Chest pain EXAM: PORTABLE CHEST 1 VIEW COMPARISON:  January 09, 2019 FINDINGS: The heart size and mediastinal contours are within normal limits. Aortic knob calcifications and overlying median sternotomy wires. Tiny calcified granulomata seen within the left lower lung. Otherwise the lungs are clear. The visualized skeletal structures are unremarkable. IMPRESSION: No active disease. Electronically Signed   By: Jonna Clark M.D.   On: 04/08/2019 01:39   CT RENAL STONE STUDY  Result Date: 04/08/2019 CLINICAL DATA:  Flank pain with kidney stone suspected. Vomiting and episode of diarrhea. EXAM: CT ABDOMEN AND PELVIS WITHOUT CONTRAST TECHNIQUE: Multidetector CT imaging of the abdomen and pelvis was performed following the standard protocol without IV contrast. COMPARISON:  07/22/2017 FINDINGS:  Lower chest:  Coronary atherosclerosis. Hepatobiliary: No focal liver abnormality.No evidence of biliary obstruction or stone. Pancreas: Unremarkable. Spleen: Unremarkable. Adrenals/Urinary Tract: Negative adrenals. No hydronephrosis or ureteral stone. Small cystic density is suggested at the lower pole left kidney. The bladder is decompressed by a Foley catheter. The bladder has an indistinct wall with prominent thickness. High-density within the bladder appears crescentic and not typical of a stone. Stomach/Bowel:  No obstruction. No visible inflammation Vascular/Lymphatic: Diffuse atherosclerotic calcification of the aorta and iliacs. No mass or adenopathy. Reproductive:No pathologic findings. Possible lipoma along the lower right spermatic cord, but only partially covered. No distension of the right upper inguinal canal to suggest this reflects a fatty  hernia. Other: No ascites or pneumoperitoneum. Musculoskeletal: No acute abnormalities. Spondylosis and degenerative facet spurring IMPRESSION: 1. No acute finding.  No hydronephrosis or ureteral calculus. 2. Indistinct bladder wall, please correlate for cystitis symptoms. 3. History of vomiting and diarrhea. No visible bowel inflammation or obstruction. Electronically Signed   By: Marnee Spring M.D.   On: 04/08/2019 06:00    Medications:   . alfuzosin  10 mg Oral Q breakfast  . aspirin EC  81 mg Oral Daily  . bethanechol  5 mg Oral TID  . carvedilol  6.25 mg Oral BID  . clopidogrel  75 mg Oral Daily  . docusate sodium  100 mg Oral Daily  . enoxaparin (LOVENOX) injection  40 mg Subcutaneous Q24H  . ezetimibe  10 mg Oral Daily  . finasteride  5 mg Oral Daily  . isosorbide mononitrate  60 mg Oral Daily  . latanoprost  1 drop Both Eyes QHS  . linaclotide  290 mcg Oral QAC breakfast  . nitroGLYCERIN      . pantoprazole  40 mg Oral Daily  . ranolazine  1,000 mg Oral BID   Continuous Infusions: . sodium chloride    . cefTRIAXone (ROCEPHIN)  IV Stopped (04/08/19 0859)     LOS: 0 days   Gwenyth Bender NP  [facility]   How to contact the [facility] Attending or Consulting provider 7A - 7P or covering provider during after hours 7P -7A, for this patient?  1. Check the care team in [facility] and look for a) attending/consulting [facility] provider listed and b) the [facility] team listed 2. Log into [email or web address] and use [facility] universal password to access. If you do not have the password, please contact the hospital operator. 3. Locate the [facility] provider you are looking for under [facility] and page to a number that you can be directly reached. 4. If you still have difficulty reaching the provider, please page the [facility] (Director on Call) for the Hospitalists listed on amion for assistance.  04/08/2019, 9:58 AM

## 2019-04-08 NOTE — ED Triage Notes (Signed)
Pt BIB [facility] for eval of vomiting x 3 days. Pt reports episode of chest pain earlier today that subsided w/ home NTG. Pt reports 2-3 episodes of vomiting today. Hx of ETOH abuse, pt appears jaundiced on arrival, no documented hx of liver failure. #20G R AC.

## 2019-04-09 DIAGNOSIS — R079 Chest pain, unspecified: Secondary | ICD-10-CM | POA: Diagnosis not present

## 2019-04-09 DIAGNOSIS — N179 Acute kidney failure, unspecified: Secondary | ICD-10-CM | POA: Diagnosis not present

## 2019-04-09 DIAGNOSIS — R112 Nausea with vomiting, unspecified: Secondary | ICD-10-CM | POA: Diagnosis not present

## 2019-04-09 LAB — URINE CULTURE: Culture: NO GROWTH

## 2019-04-09 LAB — CBC
HCT: 30.5 % — ABNORMAL LOW (ref 39.0–52.0)
Hemoglobin: 10.4 g/dL — ABNORMAL LOW (ref 13.0–17.0)
MCH: 33.3 pg (ref 26.0–34.0)
MCHC: 34.1 g/dL (ref 30.0–36.0)
MCV: 97.8 fL (ref 80.0–100.0)
Platelets: 189 10*3/uL (ref 150–400)
RBC: 3.12 MIL/uL — ABNORMAL LOW (ref 4.22–5.81)
RDW: 13.5 % (ref 11.5–15.5)
WBC: 5.9 10*3/uL (ref 4.0–10.5)
nRBC: 0 % (ref 0.0–0.2)

## 2019-04-09 LAB — BASIC METABOLIC PANEL
Anion gap: 10 (ref 5–15)
BUN: 16 mg/dL (ref 8–23)
CO2: 20 mmol/L — ABNORMAL LOW (ref 22–32)
Calcium: 8.8 mg/dL — ABNORMAL LOW (ref 8.9–10.3)
Chloride: 104 mmol/L (ref 98–111)
Creatinine, Ser: 1.17 mg/dL (ref 0.61–1.24)
GFR calc Af Amer: >60 mL/min (ref >60)
GFR calc non Af Amer: 59 mL/min — ABNORMAL LOW (ref >60)
Glucose, Bld: 99 mg/dL (ref 70–99)
Potassium: 3.7 mmol/L (ref 3.5–5.1)
Sodium: 134 mmol/L — ABNORMAL LOW (ref 135–145)

## 2019-04-09 LAB — GLUCOSE, CAPILLARY: Glucose-Capillary: 115 mg/dL — ABNORMAL HIGH (ref 70–99)

## 2019-04-09 MED ORDER — OXYCODONE HCL 5 MG PO TABS: 5.0000 mg | ORAL_TABLET | ORAL | Status: DC | PRN

## 2019-04-09 MED ORDER — MORPHINE SULFATE (PF) 2 MG/ML IV SOLN
1.0000 mg | INTRAVENOUS | Status: DC | PRN
  Administered 2019-04-10: 1 mg via INTRAVENOUS
  Filled 2019-04-09 (×2): qty 1

## 2019-04-09 MED ORDER — SODIUM CHLORIDE 0.9 % IV SOLN: INTRAVENOUS | Status: DC

## 2019-04-09 MED ORDER — PANTOPRAZOLE SODIUM 40 MG PO TBEC
40.0000 mg | DELAYED_RELEASE_TABLET | Freq: Two times a day (BID) | ORAL | Status: DC
  Administered 2019-04-09 – 2019-04-11 (×4): 40 mg via ORAL
  Filled 2019-04-09 (×4): qty 1

## 2019-04-09 NOTE — Evaluation (Signed)
Physical Therapy Evaluation Patient Details Name: Austin Malone MRN: [id number] DOB: 05-16-1940 Today's Date: 04/09/2019   History of Present Illness  79 y.o. male with a past medical history that includes CAD status post CABG 2008, cardiac cath with PCI in January 2020, chronic indwelling Foley catheter with recurrent UTIs on Levaquin for 2 days, hypertension, chronic anemia scented to the emergency department chief complaint of chest pain  Clinical Impression   Pt admitted with above diagnosis. PTA was living home alone with dog, states has step children close by. Pt states was independent with all ADLs and IADLs and was ambulating with RW. Pt currently with functional limitations due to the deficits listed below (see PT Problem List). This pm pt did well with mobility was able to complete bed mob with set up and stand by assist, transfers with min a and walker and ambulated approx 242ft with RW and min a. Pt will benefit from skilled PT to increase their independence and safety with mobility to allow discharge to the venue listed below.       Follow Up Recommendations Home health PT    Equipment Recommendations  None recommended by PT    Recommendations for Other Services OT consult     Precautions / Restrictions Precautions Precautions: Fall Restrictions Weight Bearing Restrictions: No      Mobility  Bed Mobility Overal bed mobility: Needs Assistance Bed Mobility: Supine to Sit;Sit to Supine     Supine to sit: Supervision Sit to supine: Supervision      Transfers Overall transfer level: Needs assistance Equipment used: Rolling walker (2 wheeled) Transfers: Sit to/from [employer] Sit to Stand: Min guard;Min assist Stand pivot transfers: Min guard;Min assist          Ambulation/Gait Ambulation/Gait assistance: Min guard;Supervision Gait Distance (Feet): 250 Feet Assistive device: Rolling walker (2 wheeled) Gait Pattern/deviations:  Step-through pattern Gait velocity: dec      Stairs            Wheelchair Mobility    Modified Rankin (Stroke Patients Only)       Balance Overall balance assessment: Mild deficits observed, not formally tested                                           Pertinent Vitals/Pain Pain Assessment: Faces Faces Pain Scale: Hurts little more Pain Location: w/ some mobility Pain Descriptors / Indicators: Guarding Pain Intervention(s): Limited activity within patient's tolerance    Home Living Family/patient expects to be discharged to:: Private residence Living Arrangements: Alone Available Help at Discharge: Family Type of Home: House Home Access: Stairs to enter   Secretary/administrator of Steps: 3-4 Home Layout: One level Home Equipment: Environmental consultant - 2 wheels      Prior Function Level of Independence: Independent with assistive device(s)               Hand Dominance        Extremity/Trunk Assessment   Upper Extremity Assessment Upper Extremity Assessment: Overall WFL for tasks assessed    Lower Extremity Assessment Lower Extremity Assessment: Generalized weakness    Cervical / Trunk Assessment Cervical / Trunk Assessment: Normal  Communication   Communication: No difficulties  Cognition Arousal/Alertness: Awake/alert Behavior During Therapy: WFL for tasks assessed/performed Overall Cognitive Status: No family/caregiver present to determine baseline cognitive functioning  General Comments: seems to be at or close to baseline      General Comments General comments (skin integrity, edema, etc.): VSS on RA    Exercises     Assessment/Plan    PT Assessment Patient needs continued PT services  PT Problem List Decreased activity tolerance;Decreased balance;Decreased mobility;Decreased coordination;Decreased safety awareness       PT Treatment Interventions Gait training;DME  instruction;Stair training;Functional mobility training;Therapeutic activities;Therapeutic exercise;Balance training;Neuromuscular re-education;Patient/family education    PT Goals (Current goals can be found in the Care Plan section)  Acute Rehab PT Goals Patient Stated Goal: get home to dog PT Goal Formulation: With patient Time For Goal Achievement: 04/23/19 Potential to Achieve Goals: Good    Frequency Min 3X/week   Barriers to discharge Other (comment) lives alone    Co-evaluation               AM-PAC PT "6 Clicks" Mobility  Outcome Measure Help needed turning from your back to your side while in a flat bed without using bedrails?: None Help needed moving from lying on your back to sitting on the side of a flat bed without using bedrails?: None Help needed moving to and from a bed to a chair (including a wheelchair)?: A Little Help needed standing up from a chair using your arms (e.g., wheelchair or bedside chair)?: A Little Help needed to walk in hospital room?: A Little Help needed climbing 3-5 steps with a railing? : A Lot 6 Click Score: 19    End of Session Equipment Utilized During Treatment: Gait belt Activity Tolerance: Patient limited by fatigue;Patient tolerated treatment well Patient left: in bed;with call bell/phone within reach;with bed alarm set Nurse Communication: Mobility status PT Visit Diagnosis: Other abnormalities of gait and mobility (R26.89);Unsteadiness on feet (R26.81)    Time: 6759-1638 PT Time Calculation (min) (ACUTE ONLY): 22 min   Charges:   PT Evaluation $PT Eval Moderate Complexity: 1 Mod PT Treatments $Gait Training: 8-22 mins        Horald Chestnut, PT   Delford Field 04/09/2019, 2:52 PM

## 2019-04-09 NOTE — Progress Notes (Addendum)
Progress Note    Austin Malone  TFT:[id number] DOB: 1940-12-01  DOA: 04/08/2019 PCP: Galvin Proffer, MD    Brief Narrative:    Medical records reviewed and are as summarized below:  Austin Malone is an 79 y.o. male with history of CAD status post CABG, chronic indwelling Foley catheter with recurrent UTI on Levaquin for last 2 days, hypertension chronic anemia presents to the ER with multiple varying and fleeting complaints.  Complaints range from chest pain, UTI, nausea and vomiting, dizziness.  Assessment/Plan:   Principal Problem:   Chest pain Active Problems:   Hx of CABG   AKI (acute kidney injury) (HCC)   Essential hypertension   Nausea & vomiting   CKD (chronic kidney disease) stage 3, GFR 30-59 ml/min   Recurrent UTI      Chest pain.   -History of CAD status post CABG 2008 and stents 2020. patient continues with intermittent left anterior chest pain.  He was given nitro several times with only brief relief.   -Patient states he gets chest pain mainly at night on a regular basis, sometimes several times a night.  He takes multiple doses of nitro when this happens -Has been seen by cardiology and they do not recommend any further work-up: Thinking this is related to his chronic stable angina -We will change PPI to twice daily in case pain is related to GI complaints.  Patient states he is on a keto diet and does long fasting of 16 hours/day  Dizziness -Not clear as patient reports it comes and goes but cannot report any pattern with movement/changing position  Acute kidney injury superimposed on ckd III.   -Likely related to dehydration in the setting of nausea vomiting and Lasix.  -Creatinine much improved    Recurrent UTI.  CT scan renal stone study shows cystitis patient has a chronic indwelling catheter.  Recently on Levaquin.  Rocephin initiated in the emergency department.  He is afebrile hemodynamically stable and nontoxic-appearing -Urine culture  appears to be no growth but not sure if was sent after antibiotics started we will plan for a very short course of antibiotics as I am not convinced of an active infection   Nausea and vomiting.  -Appears resolved  hypertension. -Resume home meds   Late entry: orthostatics were markedly positive-- will get AM cortisol, give IVF and place TED hose-- recheck in AM.   Family Communication/Anticipated D/C date and plan/Code Status   DVT prophylaxis: Lovenox ordered. Code Status: Full Code.  Family Communication:  Disposition Plan: Patient reports he lives home alone.  Await PT eval.  Patient is a difficult historian and his reason for presentation continues to change.   Medical Consultants:    Cardiology  PT  Subjective:   Patient again is a difficult historian with multiple changing complaints Current complaint was dizziness but with further questioning patient states this has resolved  Objective:    Vitals:   04/09/19 0653 04/09/19 0700 04/09/19 0750 04/09/19 0947  BP: (!) 201/91 (!) 177/90 137/73 135/79  Pulse: 66 65 63 63  Resp: 17 15 15 14   Temp:      TempSrc:      SpO2: 100% 99% 96% 98%  Weight:      Height:        Intake/Output Summary (Last 24 hours) at 04/09/2019 1434 Last data filed at 04/09/2019 0800 Gross per 24 hour  Intake 1185.93 ml  Output 1200 ml  Net -14.07 ml   04/11/2019  04/08/19 0041 04/09/19 0430  Weight: 81.6 kg 76.3 kg    Exam: In bed, no acute distress Odd affect Regular rate and rhythm No increased work of breathing Moves all 4 extremities Foley draining clear urine  Data Reviewed:   I have personally reviewed following labs and imaging studies:  Labs: Labs show the following:   Basic Metabolic Panel: Recent Labs  Lab 04/08/19 0107 04/08/19 0638 04/09/19 0421  NA 136  --  134*  K 3.8  --  3.7  CL 105  --  104  CO2 19*  --  20*  GLUCOSE 101*  --  99  BUN 16  --  16  CREATININE 1.58* 1.64* 1.17  CALCIUM  8.3*  --  8.8*   GFR Estimated Creatinine Clearance: 52 mL/min (by C-G formula based on SCr of 1.17 mg/dL). Liver Function Tests: Recent Labs  Lab 04/08/19 0107  AST 26  ALT 15  ALKPHOS 38  BILITOT 0.6  PROT 6.0*  ALBUMIN 3.3*   Recent Labs  Lab 04/08/19 0107  LIPASE 20   No results for input(s): AMMONIA in the last 168 hours. Coagulation profile No results for input(s): INR, PROTIME in the last 168 hours.  CBC: Recent Labs  Lab 04/08/19 0107 04/08/19 0638 04/09/19 0421  WBC 5.8 5.8 5.9  NEUTROABS 3.8  --   --   HGB 10.4* 10.3* 10.4*  HCT 31.7* 31.1* 30.5*  MCV 100.6* 99.0 97.8  PLT 208 202 189   Cardiac Enzymes: No results for input(s): CKTOTAL, CKMB, CKMBINDEX, TROPONINI in the last 168 hours. BNP (last 3 results) No results for input(s): PROBNP in the last 8760 hours. CBG: Recent Labs  Lab 04/08/19 0637 04/08/19 1143 04/08/19 1712 04/09/19 0030  GLUCAP 100* 99 96 115*   D-Dimer: No results for input(s): DDIMER in the last 72 hours. Hgb A1c: No results for input(s): HGBA1C in the last 72 hours. Lipid Profile: No results for input(s): CHOL, HDL, LDLCALC, TRIG, CHOLHDL, LDLDIRECT in the last 72 hours. Thyroid function studies: No results for input(s): TSH, T4TOTAL, T3FREE, THYROIDAB in the last 72 hours.  Invalid input(s): FREET3 Anemia work up: No results for input(s): VITAMINB12, FOLATE, FERRITIN, TIBC, IRON, RETICCTPCT in the last 72 hours. Sepsis Labs: Recent Labs  Lab 04/08/19 0107 04/08/19 9735 04/09/19 0421  WBC 5.8 5.8 5.9    Microbiology Recent Results (from the past 240 hour(s))  SARS CORONAVIRUS 2 (TAT 6-24 HRS) Nasopharyngeal Nasopharyngeal Swab     Status: None   Collection Time: 04/08/19  6:08 AM   Specimen: Nasopharyngeal Swab  Result Value Ref Range Status   SARS Coronavirus 2 NEGATIVE NEGATIVE Final    Comment: (NOTE) SARS-CoV-2 target nucleic acids are NOT DETECTED. The SARS-CoV-2 RNA is generally detectable in upper  and lower respiratory specimens during the acute phase of infection. Negative results do not preclude SARS-CoV-2 infection, do not rule out co-infections with other pathogens, and should not be used as the sole basis for treatment or other patient management decisions. Negative results must be combined with clinical observations, patient history, and epidemiological information. The expected result is Negative. Fact Sheet for Patients: HairSlick.no Fact Sheet for Healthcare Providers: quierodirigir.com This test is not yet approved or cleared by the [place] FDA and  has been authorized for detection and/or diagnosis of SARS-CoV-2 by FDA under an Emergency Use Authorization (EUA). This EUA will remain  in effect (meaning this test can be used) for the duration of the COVID-19 declaration under Section  56 4(b)(1) of the Act, 21 U.S.C. section 360bbb-3(b)(1), unless the authorization is terminated or revoked sooner. Performed at [facility], [place] [address] [place], [place] [address]   Culture, Urine     Status: None   Collection Time: 04/08/19  9:58 AM   Specimen: Urine, Random  Result Value Ref Range Status   Specimen Description URINE, RANDOM  Final   Special Requests SITE NOT SPECIFIED  Final   Culture   Final    NO GROWTH Performed at [facility], [place] [address], [place], [place] [address]    Report Status 04/09/2019 FINAL  Final    Procedures and diagnostic studies:  DG Chest Port 1 View  Result Date: 04/08/2019 CLINICAL DATA:  Chest pain EXAM: PORTABLE CHEST 1 VIEW COMPARISON:  January 09, 2019 FINDINGS: The heart size and mediastinal contours are within normal limits. Aortic knob calcifications and overlying median sternotomy wires. Tiny calcified granulomata seen within the left lower lung. Otherwise the lungs are clear. The visualized skeletal structures are unremarkable. IMPRESSION: No active  disease. Electronically Signed   By: Prudencio Pair M.D.   On: 04/08/2019 01:39   CT RENAL STONE STUDY  Result Date: 04/08/2019 CLINICAL DATA:  Flank pain with kidney stone suspected. Vomiting and episode of diarrhea. EXAM: CT ABDOMEN AND PELVIS WITHOUT CONTRAST TECHNIQUE: Multidetector CT imaging of the abdomen and pelvis was performed following the standard protocol without IV contrast. COMPARISON:  07/22/2017 FINDINGS: Lower chest:  Coronary atherosclerosis. Hepatobiliary: No focal liver abnormality.No evidence of biliary obstruction or stone. Pancreas: Unremarkable. Spleen: Unremarkable. Adrenals/Urinary Tract: Negative adrenals. No hydronephrosis or ureteral stone. Small cystic density is suggested at the lower pole left kidney. The bladder is decompressed by a Foley catheter. The bladder has an indistinct wall with prominent thickness. High-density within the bladder appears crescentic and not typical of a stone. Stomach/Bowel:  No obstruction. No visible inflammation Vascular/Lymphatic: Diffuse atherosclerotic calcification of the aorta and iliacs. No mass or adenopathy. Reproductive:No pathologic findings. Possible lipoma along the lower right spermatic cord, but only partially covered. No distension of the right upper inguinal canal to suggest this reflects a fatty hernia. Other: No ascites or pneumoperitoneum. Musculoskeletal: No acute abnormalities. Spondylosis and degenerative facet spurring IMPRESSION: 1. No acute finding.  No hydronephrosis or ureteral calculus. 2. Indistinct bladder wall, please correlate for cystitis symptoms. 3. History of vomiting and diarrhea. No visible bowel inflammation or obstruction. Electronically Signed   By: Monte Fantasia M.D.   On: 04/08/2019 06:00    Medications:    alfuzosin  10 mg Oral Q breakfast   aspirin EC  81 mg Oral Daily   bethanechol  5 mg Oral TID   carvedilol  6.25 mg Oral BID   Chlorhexidine Gluconate Cloth  6 each Topical Daily    clopidogrel  75 mg Oral Daily   diclofenac Sodium  2 g Topical QID   docusate sodium  100 mg Oral Daily   enoxaparin (LOVENOX) injection  40 mg Subcutaneous Q24H   ezetimibe  10 mg Oral Daily   finasteride  5 mg Oral Daily   isosorbide mononitrate  60 mg Oral Daily   latanoprost  1 drop Both Eyes QHS   linaclotide  290 mcg Oral QAC breakfast   pantoprazole  40 mg Oral BID   ranolazine  1,000 mg Oral BID   Continuous Infusions:  cefTRIAXone (ROCEPHIN)  IV 1 g (04/09/19 0702)     LOS: 0 days   Tomi Bamberger  Benjamine Mola  [facility]   How to contact the [facility] Attending or Consulting provider 7A - 7P or covering provider during after hours 7P -7A, for this patient?  1. Check the care team in [facility] and look for a) attending/consulting TRH provider listed and b) the [facility] team listed 2. Log into [email or web address] and use [place]'s universal password to access. If you do not have the password, please contact the hospital operator. 3. Locate the [facility] provider you are looking for under [facility] and page to a number that you can be directly reached. 4. If you still have difficulty reaching the provider, please page the [facility] (Director on Call) for the Hospitalists listed on amion for assistance.  04/09/2019, 2:34 PM

## 2019-04-09 NOTE — Care Management Obs Status (Signed)
MEDICARE OBSERVATION STATUS NOTIFICATION   Patient Details  Name: Austin Malone MRN: [id number] Date of Birth: 10-27-1940   Medicare Observation Status Notification Given:  Yes    Deveron Furlong, RN 04/09/2019, 4:23 PM

## 2019-04-09 NOTE — Progress Notes (Signed)
Nutrition Brief Note  RD working remotely.  Patient identified on the Malnutrition Screening Tool (MST) Report  Wt Readings from Last 15 Encounters:  04/09/19 76.3 kg  11/17/18 83.5 kg  09/17/18 85.7 kg  08/31/18 87.1 kg  05/24/18 85.7 kg  03/24/18 85.7 kg  03/02/18 84.8 kg  02/21/18 83.5 kg  02/06/18 83.5 kg  02/02/18 86.6 kg  01/27/18 88.9 kg  11/30/17 88.5 kg  09/29/17 87.5 kg  09/08/17 88 kg  08/23/17 81.7 kg   Austin Malone is a 79 y.o. male with history of CAD status post CABG, chronic indwelling Foley catheter with recurrent UTI on Levaquin for last 2 days, hypertension chronic anemia presents to the ER with complaints of having chest pain.  Patient symptoms started yesterday with multiple episodes of nausea vomiting denies any blood in the vomitus no abdominal pain or diarrhea.  Denies any fever chills had some shortness of breath.  Subsequently in the later evening patient started having chest pressure across the chest.  Patient took 2 sublingual nitroglycerin despite which pain did not improve and came to the ER.  Pt admitted with chest pain.   Reviewed I/O's: -16 ml x 24 hours  UOP: 1.2 L x 24 hours  Attempted to speak with pt via phone x 2, however, no answer.   Reviewed wt hx; noted pt has experienced a 10% wt loss over the past 6 months, which is significant for time frame. Per admission records, pt has been intentionally losing weight by following a ketogenic diet at home.   Current diet order is heart healthy, patient is consuming approximately 100% of meals at this time. Labs and medications reviewed.   No nutrition interventions warranted at this time. If nutrition issues arise, please consult RD.   Levada Schilling, RD, LDN, CDCES Registered Dietitian II Certified Diabetes Care and Education Specialist Please refer to [facility] for RD and/or RD on-call/weekend/after hours pager

## 2019-04-09 NOTE — TOC Initial Note (Signed)
Transition of Care [facility]) - Initial/Assessment Note    Patient Details  Name: Austin Malone MRN: [id number] Date of Birth: 1940/10/06  Transition [facility] Care [facility] CM/SW Contact:    Claudie Leach, RN 04/09/2019, 6:02 PM  Clinical Narrative:                 Discussed dc needs with patient. Patient states he also has Medicaid, which is not listed in Epic.    Patient states he is in home alone with dog.  His step-daughter help frequently with groceries and prescriptions.  His step-son helps some also.  They live nearby.  Patient states he needs more help as he is unable to clean house or cook.  He eats frozen meals, which he doesn't like, and has documented weight loss.  Care [facility] referral made for meals on wheels with patient's permission. Encouraged patient to inquire about Medicaid personal care assistant.  PACE may be another option.  Patient agrees to home health PT and HHA.  Will add CSW to [facility] to assist in community resources.   Discussed Medicare rated list with patient. He has no preference of [place] agency.  Patient has RW and states does not need other DME.    Expected Discharge Plan: [place] Barriers to Discharge: Continued Medical Work up    Expected Discharge Plan and Services Expected Discharge Plan: [place] arrangements for the past 2 months: Single Family Home                   Prior Living Arrangements/Services Living arrangements for the past 2 months: Single Family Home Lives with:: Self, Pets Patient language and need for interpreter reviewed:: No Do you feel safe going back to the place where you live?: No   lack of healthy food, no help  Need for Family Participation in Patient Care: Yes (Comment) Care giver support system in place?: Yes (comment)   Criminal Activity/Legal Involvement Pertinent to Current Situation/Hospitalization: Yes - Comment as needed  Activities of Daily Living Home Assistive  Devices/Equipment: Eyeglasses, Gilford Rile (specify type) ADL Screening (condition at time of admission) Patient's cognitive ability adequate to safely complete daily activities?: Yes Is the patient deaf or have difficulty hearing?: No Does the patient have difficulty seeing, even when wearing glasses/contacts?: No Does the patient have difficulty concentrating, remembering, or making decisions?: No Patient able to express need for assistance with ADLs?: Yes Does the patient have difficulty dressing or bathing?: No Independently performs ADLs?: Yes (appropriate for developmental age) Does the patient have difficulty walking or climbing stairs?: Yes Weakness of Legs: Both Weakness of Arms/Hands: None  Permission Sought/Granted Permission sought to share information with : Other (comment)(care 360 for meals on wheels) Permission granted to share information with : Yes, Verbal Permission Granted              Emotional Assessment Appearance:: Appears stated age, Disheveled Attitude/Demeanor/Rapport: Engaged, Gracious Affect (typically observed): Accepting Orientation: : Oriented to Self, Oriented to Place, Oriented to  Time, Oriented to Situation Alcohol / Substance Use: Not Applicable Psych Involvement: No (comment)  Admission diagnosis:  Unstable angina ([place]) [I20.0] Chest pain [R07.9] Patient Active Problem List   Diagnosis Date Noted  . Nausea & vomiting 04/08/2019  . CKD (chronic kidney disease) stage 3, GFR 30-59 ml/min 04/08/2019  . 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 Preoperative cardiovascular examination 09/17/2018  . Chest pain 05/23/2018  . Essential hypertension 03/24/2018  .  Upper GI bleed 02/19/2018  . Symptomatic anemia 02/19/2018  . AKI (acute kidney injury) (HCC) 02/19/2018  . Pericarditis 02/03/2018  . Chronic diastolic CHF (congestive heart failure) (HCC) 01/31/2018  . Chest pain at rest 09/29/2017  . Pre-operative clearance 09/08/2017  . Alcohol abuse 08/22/2017  .  Anxiety 08/22/2017  . CAD (coronary artery disease) 08/18/2017  . BPH (benign prostatic hyperplasia) 07/21/2017  . Gastroesophageal reflux disease without esophagitis 07/21/2017  . 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 Hyperlipidemia 07/21/2017  . 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 Gallbladder sludge 04/01/2015   PCP:  Galvin Proffer, MD Pharmacy:   [facility] - [place], [place] - [address] [place] [address] [place] [address] Phone: [phone] Fax: [phone]  [facility] - [place], [place] - [address] [place] [address] Phone: [phone] Fax: [phone]

## 2019-04-10 DIAGNOSIS — I951 Orthostatic hypotension: Secondary | ICD-10-CM

## 2019-04-10 DIAGNOSIS — D649 Anemia, unspecified: Secondary | ICD-10-CM | POA: Diagnosis present

## 2019-04-10 DIAGNOSIS — N183 Chronic kidney disease, stage 3 unspecified: Secondary | ICD-10-CM | POA: Diagnosis present

## 2019-04-10 DIAGNOSIS — H269 Unspecified cataract: Secondary | ICD-10-CM | POA: Diagnosis present

## 2019-04-10 DIAGNOSIS — Z955 Presence of coronary angioplasty implant and graft: Secondary | ICD-10-CM | POA: Diagnosis not present

## 2019-04-10 DIAGNOSIS — I25110 Atherosclerotic heart disease of native coronary artery with unstable angina pectoris: Secondary | ICD-10-CM | POA: Diagnosis present

## 2019-04-10 DIAGNOSIS — I252 Old myocardial infarction: Secondary | ICD-10-CM | POA: Diagnosis not present

## 2019-04-10 DIAGNOSIS — N179 Acute kidney failure, unspecified: Secondary | ICD-10-CM | POA: Diagnosis present

## 2019-04-10 DIAGNOSIS — Z7901 Long term (current) use of anticoagulants: Secondary | ICD-10-CM | POA: Diagnosis not present

## 2019-04-10 DIAGNOSIS — Z8249 Family history of ischemic heart disease and other diseases of the circulatory system: Secondary | ICD-10-CM | POA: Diagnosis not present

## 2019-04-10 DIAGNOSIS — R079 Chest pain, unspecified: Secondary | ICD-10-CM | POA: Diagnosis not present

## 2019-04-10 DIAGNOSIS — E860 Dehydration: Secondary | ICD-10-CM | POA: Diagnosis present

## 2019-04-10 DIAGNOSIS — Z79899 Other long term (current) drug therapy: Secondary | ICD-10-CM | POA: Diagnosis not present

## 2019-04-10 DIAGNOSIS — I200 Unstable angina: Secondary | ICD-10-CM | POA: Diagnosis present

## 2019-04-10 DIAGNOSIS — N3090 Cystitis, unspecified without hematuria: Secondary | ICD-10-CM | POA: Diagnosis present

## 2019-04-10 DIAGNOSIS — N400 Enlarged prostate without lower urinary tract symptoms: Secondary | ICD-10-CM | POA: Diagnosis present

## 2019-04-10 DIAGNOSIS — Z66 Do not resuscitate: Secondary | ICD-10-CM | POA: Diagnosis present

## 2019-04-10 DIAGNOSIS — Z833 Family history of diabetes mellitus: Secondary | ICD-10-CM | POA: Diagnosis not present

## 2019-04-10 DIAGNOSIS — I129 Hypertensive chronic kidney disease with stage 1 through stage 4 chronic kidney disease, or unspecified chronic kidney disease: Secondary | ICD-10-CM | POA: Diagnosis present

## 2019-04-10 DIAGNOSIS — Z951 Presence of aortocoronary bypass graft: Secondary | ICD-10-CM | POA: Diagnosis not present

## 2019-04-10 DIAGNOSIS — K219 Gastro-esophageal reflux disease without esophagitis: Secondary | ICD-10-CM | POA: Diagnosis present

## 2019-04-10 DIAGNOSIS — Z20822 Contact with and (suspected) exposure to covid-19: Secondary | ICD-10-CM | POA: Diagnosis present

## 2019-04-10 DIAGNOSIS — Z87440 Personal history of urinary (tract) infections: Secondary | ICD-10-CM | POA: Diagnosis not present

## 2019-04-10 DIAGNOSIS — Y846 Urinary catheterization as the cause of abnormal reaction of the patient, or of later complication, without mention of misadventure at the time of the procedure: Secondary | ICD-10-CM | POA: Diagnosis present

## 2019-04-10 DIAGNOSIS — N200 Calculus of kidney: Secondary | ICD-10-CM | POA: Diagnosis present

## 2019-04-10 DIAGNOSIS — T83511A Infection and inflammatory reaction due to indwelling urethral catheter, initial encounter: Secondary | ICD-10-CM | POA: Diagnosis present

## 2019-04-10 DIAGNOSIS — E785 Hyperlipidemia, unspecified: Secondary | ICD-10-CM | POA: Diagnosis present

## 2019-04-10 HISTORY — DX: Orthostatic hypotension: I95.1

## 2019-04-10 LAB — BASIC METABOLIC PANEL
Anion gap: 8 (ref 5–15)
BUN: 10 mg/dL (ref 8–23)
CO2: 22 mmol/L (ref 22–32)
Calcium: 8.7 mg/dL — ABNORMAL LOW (ref 8.9–10.3)
Chloride: 108 mmol/L (ref 98–111)
Creatinine, Ser: 1.03 mg/dL (ref 0.61–1.24)
GFR calc Af Amer: >60 mL/min (ref >60)
GFR calc non Af Amer: >60 mL/min (ref >60)
Glucose, Bld: 99 mg/dL (ref 70–99)
Potassium: 3.9 mmol/L (ref 3.5–5.1)
Sodium: 138 mmol/L (ref 135–145)

## 2019-04-10 LAB — GLUCOSE, CAPILLARY
Glucose-Capillary: 100 mg/dL — ABNORMAL HIGH (ref 70–99)
Glucose-Capillary: 104 mg/dL — ABNORMAL HIGH (ref 70–99)
Glucose-Capillary: 111 mg/dL — ABNORMAL HIGH (ref 70–99)
Glucose-Capillary: 115 mg/dL — ABNORMAL HIGH (ref 70–99)
Glucose-Capillary: 139 mg/dL — ABNORMAL HIGH (ref 70–99)
Glucose-Capillary: 95 mg/dL (ref 70–99)

## 2019-04-10 LAB — CORTISOL: Cortisol, Plasma: 7.1 ug/dL

## 2019-04-10 LAB — TSH: TSH: 1.424 u[IU]/mL (ref 0.350–4.500)

## 2019-04-10 MED ORDER — CHLORHEXIDINE GLUCONATE CLOTH 2 % EX PADS
6.0000 | MEDICATED_PAD | Freq: Every day | CUTANEOUS | Status: DC
  Administered 2019-04-10 – 2019-04-11 (×2): 6 via TOPICAL

## 2019-04-10 MED ORDER — LINACLOTIDE 145 MCG PO CAPS
290.0000 ug | ORAL_CAPSULE | Freq: Every day | ORAL | Status: DC
  Administered 2019-04-10 – 2019-04-11 (×2): 290 ug via ORAL
  Filled 2019-04-10 (×3): qty 2

## 2019-04-10 MED ORDER — NITROGLYCERIN 0.4 MG SL SUBL
SUBLINGUAL_TABLET | SUBLINGUAL | Status: AC
  Filled 2019-04-10: qty 1

## 2019-04-10 MED ORDER — MORPHINE SULFATE (PF) 2 MG/ML IV SOLN
2.0000 mg | INTRAVENOUS | Status: DC | PRN
  Administered 2019-04-10 – 2019-04-11 (×2): 2 mg via INTRAVENOUS
  Filled 2019-04-10 (×2): qty 1

## 2019-04-10 NOTE — Progress Notes (Signed)
Pt in bed semi fowlers. BO 115/56. Denies chest pain or further nausea.

## 2019-04-10 NOTE — Progress Notes (Addendum)
Progress Note    Austin Malone  EVO:[id number] DOB: 1940/12/25  DOA: 04/08/2019 PCP: Bonnita Nasuti, MD    Brief Narrative:    Medical records reviewed and are as summarized below:  Austin Malone is an 79 y.o. male with history of CAD status post CABG, chronic indwelling Foley catheter with recurrent UTI on Levaquin for last 2 days, hypertension chronic anemia presents to the ER with multiple varying and fleeting complaints.  Complaints range from chest pain, UTI, nausea and vomiting, dizziness.  Assessment/Plan:   Principal Problem:   Chest pain Active Problems:   Hx of CABG   AKI (acute kidney injury) ([place])   Essential hypertension   Nausea & vomiting   CKD (chronic kidney disease) stage 3, GFR 30-59 ml/min   Recurrent UTI   Orthostatic hypotension    Orthostatic hypotension Patient continues to be orthostatic despite IV fluids overnight -Patient very symptomatic with drop in blood pressure -TED hose have been placed -Cortisol normal -TSH normal -If continues to be orthostatic in the a.m. may need abdominal binder/medications   Chest pain.   -History of CAD status post CABG 2008 and stents 2020. patient continues with intermittent left anterior chest pain.  He was given nitro several times with only brief relief.   -Patient states he gets chest pain mainly at night on a regular basis, sometimes several times a night.  He takes multiple doses of nitro when this happens -Has been seen by cardiology and they do not recommend any further work-up: Thinking this is related to his chronic stable angina -We will change PPI to twice daily in case pain is related to GI complaints.  Patient states he is on a keto diet and does long fasting of 16 hours/day -Suspect there are some components of anxiety that goes along with the chest pain  Acute kidney injury superimposed on ckd III.   -Likely related to dehydration in the setting of nausea vomiting and Lasix.  -Creatinine  much improved    Recurrent UTI.  CT scan renal stone study shows cystitis patient has a chronic indwelling catheter.  Recently on Levaquin.  Rocephin initiated in the emergency department.  He is afebrile hemodynamically stable and nontoxic-appearing -Urine culture appears to be no growth but not sure if was sent after antibiotics started we will plan for a very short course of antibiotics as I am not convinced of an active infection   Nausea and vomiting.  -resolved  hypertension. -Resume home meds     Family Communication/Anticipated D/C date and plan/Code Status   DVT prophylaxis: Lovenox ordered. Code Status: Full Code.  Family Communication:  Disposition Plan: Patient lives alone and continues to be orthostatic we will continue IV fluids at the hospital.  Patient will need home health when discharged Will change patient to inpatient as he continues to be orthostatic and needs continued IV fluids and medication adjustment  Medical Consultants:    Cardiology  PT  Subjective:   Patient reported a vasovagal response after bowel movement -Patient states he has had less episodes of chest pain since Protonix adjusted but still feels dizzy with movement  Objective:    Vitals:   04/10/19 1010 04/10/19 1208 04/10/19 1350 04/10/19 1416  BP: 128/61 (!) 149/80 (!) 72/43 (!) 115/56  Pulse: (!) 32  (!) 53 70  Resp: (!) 21 17 (!) 21   Temp:  98 F (36.7 C)    TempSrc:  Oral    SpO2: (!) 73%  97%  Weight:      Height:        Intake/Output Summary (Last 24 hours) at 04/10/2019 1457 Last data filed at 04/10/2019 1400 Gross per 24 hour  Intake 1175.34 ml  Output 1900 ml  Net -724.66 ml   Filed Weights   04/08/19 0041 04/09/19 0430 04/10/19 0655  Weight: 81.6 kg 76.3 kg 76.1 kg    Exam: In bed, able to lay flat Regular rate and rhythm No increased work of breathing, no wheezing, lung fields cleared Moves all 4 extremities Alert and oriented x3 but does appear  anxious  Data Reviewed:   I have personally reviewed following labs and imaging studies:  Labs: Labs show the following:   Basic Metabolic Panel: Recent Labs  Lab 04/08/19 0107 04/08/19 0107 04/08/19 0638 04/09/19 0421 04/10/19 0515  NA 136  --   --  134* 138  K 3.8   < >  --  3.7 3.9  CL 105  --   --  104 108  CO2 19*  --   --  20* 22  GLUCOSE 101*  --   --  99 99  BUN 16  --   --  16 10  CREATININE 1.58*  --  1.64* 1.17 1.03  CALCIUM 8.3*  --   --  8.8* 8.7*   < > = values in this interval not displayed.   GFR Estimated Creatinine Clearance: 59.1 mL/min (by C-G formula based on SCr of 1.03 mg/dL). Liver Function Tests: Recent Labs  Lab 04/08/19 0107  AST 26  ALT 15  ALKPHOS 38  BILITOT 0.6  PROT 6.0*  ALBUMIN 3.3*   Recent Labs  Lab 04/08/19 0107  LIPASE 20   No results for input(s): AMMONIA in the last 168 hours. Coagulation profile No results for input(s): INR, PROTIME in the last 168 hours.  CBC: Recent Labs  Lab 04/08/19 0107 04/08/19 0638 04/09/19 0421  WBC 5.8 5.8 5.9  NEUTROABS 3.8  --   --   HGB 10.4* 10.3* 10.4*  HCT 31.7* 31.1* 30.5*  MCV 100.6* 99.0 97.8  PLT 208 202 189   Cardiac Enzymes: No results for input(s): CKTOTAL, CKMB, CKMBINDEX, TROPONINI in the last 168 hours. BNP (last 3 results) No results for input(s): PROBNP in the last 8760 hours. CBG: Recent Labs  Lab 04/09/19 0030 04/10/19 0035 04/10/19 0706 04/10/19 1157 04/10/19 1359  GLUCAP 115* 111* 95 115* 139*   D-Dimer: No results for input(s): DDIMER in the last 72 hours. Hgb A1c: No results for input(s): HGBA1C in the last 72 hours. Lipid Profile: No results for input(s): CHOL, HDL, LDLCALC, TRIG, CHOLHDL, LDLDIRECT in the last 72 hours. Thyroid function studies: Recent Labs    04/10/19 0515  TSH 1.424   Anemia work up: No results for input(s): VITAMINB12, FOLATE, FERRITIN, TIBC, IRON, RETICCTPCT in the last 72 hours. Sepsis Labs: Recent Labs  Lab  04/08/19 0107 04/08/19 3825 04/09/19 0421  WBC 5.8 5.8 5.9    Microbiology Recent Results (from the past 240 hour(s))  SARS CORONAVIRUS 2 (TAT 6-24 HRS) Nasopharyngeal Nasopharyngeal Swab     Status: None   Collection Time: 04/08/19  6:08 AM   Specimen: Nasopharyngeal Swab  Result Value Ref Range Status   SARS Coronavirus 2 NEGATIVE NEGATIVE Final    Comment: (NOTE) SARS-CoV-2 target nucleic acids are NOT DETECTED. The SARS-CoV-2 RNA is generally detectable in upper and lower respiratory specimens during the acute phase of infection. Negative results do not preclude SARS-CoV-2  infection, do not rule out co-infections with other pathogens, and should not be used as the sole basis for treatment or other patient management decisions. Negative results must be combined with clinical observations, patient history, and epidemiological information. The expected result is Negative. Fact Sheet for Patients: HairSlick.no Fact Sheet for Healthcare Providers: quierodirigir.com This test is not yet approved or cleared by the [place] FDA and  has been authorized for detection and/or diagnosis of SARS-CoV-2 by FDA under an Emergency Use Authorization (EUA). This EUA will remain  in effect (meaning this test can be used) for the duration of the COVID-19 declaration under Section 56 4(b)(1) of the Act, 21 U.S.C. section 360bbb-3(b)(1), unless the authorization is terminated or revoked sooner. Performed at [facility], [address] [place], [place] [address]   Culture, Urine     Status: None   Collection Time: 04/08/19  9:58 AM   Specimen: Urine, Random  Result Value Ref Range Status   Specimen Description URINE, RANDOM  Final   Special Requests SITE NOT SPECIFIED  Final   Culture   Final    NO GROWTH Performed at [facility], [address] [place], [place] [address]    Report Status 04/09/2019 FINAL  Final     Procedures and diagnostic studies:  No results found.  Medications:   . alfuzosin  10 mg Oral Q breakfast  . aspirin EC  81 mg Oral Daily  . bethanechol  5 mg Oral TID  . carvedilol  6.25 mg Oral BID  . clopidogrel  75 mg Oral Daily  . diclofenac Sodium  2 g Topical QID  . docusate sodium  100 mg Oral Daily  . enoxaparin (LOVENOX) injection  40 mg Subcutaneous Q24H  . ezetimibe  10 mg Oral Daily  . finasteride  5 mg Oral Daily  . isosorbide mononitrate  60 mg Oral Daily  . latanoprost  1 drop Both Eyes QHS  . linaclotide  290 mcg Oral QAC breakfast  . nitroGLYCERIN      . pantoprazole  40 mg Oral BID  . ranolazine  1,000 mg Oral BID   Continuous Infusions: . sodium chloride 75 mL/hr at 04/10/19 0949     LOS: 0 days   Joseph Art  [facility]   How to contact the [facility] Attending or Consulting provider 7A - 7P or covering provider during after hours 7P -7A, for this patient?  1. Check the care team in [facility] and look for a) attending/consulting [facility] provider listed and b) the [facility] team listed 2. Log into [email or web address] and use [facility]'s universal password to access. If you do not have the password, please contact the hospital operator. 3. Locate the [facility] provider you are looking for under [facility] and page to a number that you can be directly reached. 4. If you still have difficulty reaching the provider, please page the [facility] (Director on Call) for the Hospitalists listed on amion for assistance.  04/10/2019, 2:57 PM

## 2019-04-10 NOTE — Progress Notes (Addendum)
Pt was on bedside commode having BM. Pt became sweaty. Denied chest pain. Denied increased dizziness. BP checked, found to be low 70's/low 40s. Pt moved to bed. BP checked, was low 98/52. CBG found to be 132. One back in bed, pt vomited 400 mL. Pt will have automatic BP q 5 min per primary RN. Primary RN at bedside.

## 2019-04-10 NOTE — Progress Notes (Addendum)
orthostics pending

## 2019-04-10 NOTE — Progress Notes (Addendum)
Called to patient room by nrsg student at 1350. Patient on bedside commode post large BM. Probable vagal reaction; very diaphoretic, N/V, hypotensive (72/43).  Patient assisted back to bed, zofran given, 250 IVF bolus given. CBG wnl (139).  Symptoms largely relieved at this time.  Dr. Benjamine Mola notified.

## 2019-04-11 LAB — GLUCOSE, CAPILLARY
Glucose-Capillary: 95 mg/dL (ref 70–99)
Glucose-Capillary: 108 mg/dL — ABNORMAL HIGH (ref 70–99)
Glucose-Capillary: 88 mg/dL (ref 70–99)

## 2019-04-11 MED ORDER — PANTOPRAZOLE SODIUM 40 MG PO TBEC: 40.0000 mg | DELAYED_RELEASE_TABLET | Freq: Two times a day (BID) | ORAL | 0 refills | Status: AC

## 2019-04-11 MED ORDER — CEPHALEXIN 500 MG PO CAPS: 500.0000 mg | ORAL_CAPSULE | Freq: Three times a day (TID) | ORAL | 0 refills | Status: AC

## 2019-04-11 MED ORDER — DOCUSATE SODIUM 100 MG PO CAPS: 100.0000 mg | ORAL_CAPSULE | Freq: Every day | ORAL | Status: AC

## 2019-04-11 NOTE — Progress Notes (Signed)
Physical Therapy Treatment Patient Details Name: Austin Malone MRN: [id number] DOB: May 23, 1940 Today's Date: 04/11/2019    History of Present Illness 79 y.o. male with a past medical history that includes CAD status post CABG 2008, cardiac cath with PCI in January 2020, chronic indwelling Foley catheter with recurrent UTIs on Levaquin for 2 days, hypertension, chronic anemia scented to the emergency department chief complaint of chest pain    PT Comments    Pt was seen for mobility today with a complete improvement as he was able to be safely monitored on telemetry and with O2 sats checked.  Pt is very positive and talked about motivations to get stronger and get home.  Follow up with him to work on control of balance, endurance and safety with all forms of gait assistance used.  Pt is expecting to go to home to do rehab today.   Follow Up Recommendations  Home health PT     Equipment Recommendations  None recommended by PT    Recommendations for Other Services OT consult     Precautions / Restrictions Precautions Precautions: Fall Precaution Comments: monitor symptoms and telemetry vitals Restrictions Weight Bearing Restrictions: No    Mobility  Bed Mobility Overal bed mobility: Needs Assistance Bed Mobility: Supine to Sit;Sit to Supine     Supine to sit: Min guard Sit to supine: Min guard   General bed mobility comments: min guard for lines and safety  Transfers Overall transfer level: Needs assistance Equipment used: Rolling walker (2 wheeled) Transfers: Sit to/from Stand Sit to Stand: Min assist Stand pivot transfers: Min guard       General transfer comment: cues needed for hand placement  Ambulation/Gait Ambulation/Gait assistance: Min guard Gait Distance (Feet): 200 Feet Assistive device: Rolling walker (2 wheeled) Gait Pattern/deviations: Step-through pattern;Decreased stride length;Wide base of support Gait velocity: dec Gait velocity  interpretation: <1.31 ft/sec, indicative of household ambulator General Gait Details: pt is turning slowly, has a drop off of reading of O2 sats so returned to his room   Stairs             Wheelchair Mobility    Modified Rankin (Stroke Patients Only)       Balance Overall balance assessment: Needs assistance Sitting-balance support: Feet supported Sitting balance-Leahy Scale: Good     Standing balance support: Bilateral upper extremity supported;During functional activity Standing balance-Leahy Scale: Fair Standing balance comment: walker support is helpful esp with light headed feelings                            Cognition Arousal/Alertness: Awake/alert Behavior During Therapy: WFL for tasks assessed/performed Overall Cognitive Status: No family/caregiver present to determine baseline cognitive functioning                                 General Comments: good awareness of history and limits      Exercises General Exercises - Lower Extremity Ankle Circles/Pumps: AROM;5 reps Quad Sets: AROM;10 reps Gluteal Sets: AROM;10 reps Heel Slides: AROM;10 reps Hip ABduction/ADduction: AROM;10 reps Straight Leg Raises: AROM;10 reps    General Comments General comments (skin integrity, edema, etc.): O2 sats were 97% to low of 94% with all effort, pulse was controlled at 85      Pertinent Vitals/Pain Pain Assessment: Faces Faces Pain Scale: No hurt    Home Living  Prior Function            PT Goals (current goals can now be found in the care plan section) Acute Rehab PT Goals Patient Stated Goal: get home to dog Progress towards PT goals: Progressing toward goals    Frequency    Min 3X/week      PT Plan Current plan remains appropriate    Co-evaluation              AM-PAC PT "6 Clicks" Mobility   Outcome Measure  Help needed turning from your back to your side while in a flat bed without  using bedrails?: None Help needed moving from lying on your back to sitting on the side of a flat bed without using bedrails?: None Help needed moving to and from a bed to a chair (including a wheelchair)?: A Little Help needed standing up from a chair using your arms (e.g., wheelchair or bedside chair)?: A Little Help needed to walk in hospital room?: A Little Help needed climbing 3-5 steps with a railing? : A Lot 6 Click Score: 19    End of Session Equipment Utilized During Treatment: Gait belt Activity Tolerance: Patient limited by fatigue;Patient tolerated treatment well Patient left: in bed;with call bell/phone within reach;with bed alarm set Nurse Communication: Mobility status PT Visit Diagnosis: Other abnormalities of gait and mobility (R26.89);Unsteadiness on feet (R26.81)     Time: 1950-9326 PT Time Calculation (min) (ACUTE ONLY): 32 min  Charges:  $Gait Training: 8-22 mins $Therapeutic Exercise: 8-22 mins                Ramond Dial 04/11/2019, 5:04 PM  Mee Hives, PT MS Acute Rehab Dept. Number: [place]

## 2019-04-11 NOTE — Discharge Summary (Signed)
Physician Discharge Summary  Tandre Conly OHF:[id number] DOB: 09/22/1940 DOA: 04/08/2019  PCP: Bonnita Nasuti, MD  Admit date: 04/08/2019 Discharge date: 04/11/2019  Admitted From: home Disposition:  home  Recommendations for Outpatient Follow-up:  1. Follow up with PCP in 1-2 weeks 2. Please obtain BMP/CBC in one week 3. 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  CT stone study:no acute finding.  No hydronephrosis or ureteral calculus. 2. Indistinct bladder wall, please correlate for cystitis symptoms. 3. History of vomiting and diarrhea. No visible bowel inflammation or obstruction Patient was found to have symptomatic orthostatic hypotension.  Chest x-ray unremarkable. Patient was admitted seen by cardiology in relation to chest pain-advised no further ischemic work-up at this time and continues DAPT.  Last cath was in 2020 May. Patient was having constipation x4 days, 3/28 on bedside commode had a large bowel movement and had vagal reaction was diaphoretic nauseous hypotensive 72/43, discharge was held and patient was monitored for 24 hours after giving 250 ml bolus ivf.  Next morning patient outside vitals are negative he is feeling fine no chest pain nausea vomiting fever or chills.  He rather appears very anxious personality, he was wondering about narcotics prescription.  He is advised to continue his Xanax and also follow-up with PCP to discuss about narcotics.  Discharge diagnosis Assessment & Plan:  Orthostatic  hypotension, symptomatic: Patient has TSH and cortisol checked. Suspect vasovagal.  Improved with IV fluids, this morning orthostatic vitals are negative patient asymptomatic on standing and sitting.  Feels well and he feels okay discharging home today  Chest pain with history of CAD, CABG 2008 and stents 2020:Last cath 05/2018 showed unchanged anatomy as below.No culprit lesion. 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 Change PPI to twice daily to see if some of the symptoms are from GI related,patient also is on xanax and is very anxious/apprehensive and is on Xanax at home and likely contributing to his pain. He is on aspirin,Plavix, Imdur,Ranexa. His last EGD 2/82020 was: "Normal esophagus.Medium-sized hiatal hernia.Normal examined duodenum.No specimens collected.". He is advised to follow-up with PCP cardiology as outpatient.  Recurrent UTI with indwelling Foley catheter:CT shows renal stone, cystitis. Patient recently on Levaquin but in the ER placed on Rocephin. Urine culture no growth-but not sure if was sent after antibiotics started so planned for a very short course of antibiotics.  He has no symptoms currently.  AKI on CKD stage III in the setting of dehydration with nausea vomiting and Lasix. Improved. Recent Labs  Lab 04/08/19 0107 04/08/19 0638 04/09/19 0421 04/10/19 0515  BUN 16  --  16 10  CREATININE 1.58* 1.64* 1.17 1.03   Nausea and vomiting resolved  Hypertension:Meds adjustment based upon orthostatics.  Constipation: resolved, had BM 3/28.  Nutrition: Diet Order            Diet heart healthy/carb modified Room service appropriate? Yes; Fluid consistency: Thin  Diet effective now             Body mass  index is 23.89 kg/m.   DVT prophylaxis:lovenox Code Status: full Family Communication: plan of care discussed with patient at bedside. Disposition Plan: Patient is from:home Anticipated Disposition: to health PT. Barriers to discharge or conditions that needs to be met prior to discharge: Admitted with symptomatic orthostatic hypotension multiple complaints, at this time improved.  Consultants:cardiology  Procedures:see note Microbiology:see note  Subbjective Overnight afebrile. bp 140s to 180s. This morning blood pressure in 130s.  He feels well no nausea vomiting fever chills. He feels okay going home today.  Discharge Exam: Vitals:   04/11/19 0746 04/11/19 1119  BP: (!) 149/60 139/78  Pulse: 71 72  Resp:    Temp: 97.7 F (36.5 C) 98.1 F (36.7 C)  SpO2: 99% 100%   General: Pt is alert, awake, not in acute distress Cardiovascular: RRR, S1/S2 +, no rubs, no gallops Respiratory: CTA bilaterally, no wheezing, no rhonchi Abdominal: Soft, NT, ND, bowel sounds + Extremities: no edema, no cyanosis  Discharge Instructions  Discharge Instructions    Diet - low sodium heart healthy   Complete by: As directed    Discharge instructions   Complete by: As directed    Follow-up with cardiology for ongoing chest pain.  Please call call MD or return to ER for similar or worsening recurring problem that brought you to hospital or if any fever,nausea/vomiting,abdominal pain, uncontrolled pain, chest pain,  shortness of breath or any other alarming symptoms.  Please follow-up your doctor as instructed in a week time and call the office for appointment.  Please avoid alcohol, smoking, or any other illicit substance and maintain healthy habits including taking your regular medications as prescribed.  You were cared for by a hospitalist during your hospital stay. If you have any questions about your discharge medications or the care you received while you were in the hospital after you  are discharged, you can call the unit and ask to speak with the hospitalist on call if the hospitalist that took care of you is not available.  Once you are discharged, your primary care physician will handle any further medical issues. Please note that NO REFILLS for any discharge medications will be authorized once you are discharged, as it is imperative that you return to your primary care physician (or establish a relationship with a primary care physician if you do not have one) for your aftercare needs so that they can reassess your need for medications and monitor your lab values   Increase activity slowly   Complete by: As directed      Allergies as of 04/11/2019      Reactions   Codeine Other (See Comments)   unknown   Lisinopril Cough   Contrast Media [iodinated Diagnostic Agents] Hives, Rash      Medication List    TAKE these medications   alfuzosin 10 MG 24 hr tablet Commonly known as: UROXATRAL TAKE 1 TABLET BY MOUTH ONCE (1) DAILY FOR bph   ALPRAZolam 0.5 MG tablet Commonly known as: XANAX Take 0.5 mg by mouth 2 (two) times daily as needed for anxiety or sleep.   aspirin EC 81 MG tablet Take 81 mg by mouth daily.   b complex vitamins tablet Take 1 tablet by mouth daily.   bethanechol 50 MG tablet Commonly known as: URECHOLINE Take 50 mg by mouth 3 (three) times daily.   CAL-MAG-ZINC PO Take 1 tablet by  mouth in the morning, at noon, and at bedtime.   carvedilol 6.25 MG tablet Commonly known as: COREG Take 1 tablet by mouth 2 (two) times daily.   Cayenne 450 MG Caps Take by mouth.   cephALEXin 500 MG capsule Commonly known as: KEFLEX Take 1 capsule (500 mg total) by mouth 3 (three) times daily for 3 days.   clopidogrel 75 MG tablet Commonly known as: PLAVIX TAKE 1 TABLET BY MOUTH ONCE (1) DAILY   Coenzyme Q10 200 MG capsule Take 200 mg by mouth daily.   docusate sodium 100 MG capsule Commonly known as: COLACE Take 1 capsule (100 mg total) by  mouth daily.   dorzolamide-timolol 22.3-6.8 MG/ML ophthalmic solution Commonly known as: COSOPT Place 1 drop into both eyes 2 (two) times daily.   ezetimibe 10 MG tablet Commonly known as: ZETIA TAKE 1 TABLET BY MOUTH ONCE (1) DAILY   finasteride 5 MG tablet Commonly known as: PROSCAR Take 1 tablet (5 mg total) by mouth daily.   folic acid 1 MG tablet Commonly known as: FOLVITE Take 1 mg by mouth daily.   furosemide 20 MG tablet Commonly known as: LASIX TAKE 1 TABLET BY MOUTH ONCE (1) DAILY   isosorbide mononitrate 60 MG 24 hr tablet Commonly known as: IMDUR Take 1 tablet (60 mg total) by mouth daily.   levofloxacin 750 MG tablet Commonly known as: LEVAQUIN Take 750 mg by mouth daily. For 7 days   linaclotide 290 MCG Caps capsule Commonly known as: LINZESS Take 290 mcg by mouth daily before breakfast.   Melatonin 12 MG Tabs Take 120 mg by mouth at bedtime.   multivitamin with minerals tablet Take 1 tablet by mouth daily.   nitroGLYCERIN 0.4 MG SL tablet Commonly known as: NITROSTAT DISSOLVE 1 TABLET UNDER THE TONGUE EVERY 5 MINUTES AS NEEDED FOR CHEST PAIN. (MAXIMUM OF 3 DOSES). call 911 IF not better What changed: See the new instructions.   ondansetron 8 MG tablet Commonly known as: ZOFRAN Take 8 mg by mouth 2 (two) times daily.   oxyCODONE 5 MG immediate release tablet Commonly known as: Oxy IR/ROXICODONE Take 5 mg by mouth every 4 (four) hours as needed for severe pain.   pantoprazole 40 MG tablet Commonly known as: PROTONIX Take 1 tablet (40 mg total) by mouth 2 (two) times daily. What changed: when to take this   Ranexa 1000 MG SR tablet Generic drug: ranolazine Take 1,000 mg by mouth 2 (two) times daily.   Travatan Z 0.004 % Soln ophthalmic solution Generic drug: Travoprost (BAK Free) Place 1 drop into both eyes at bedtime.   vitamin C 1000 MG tablet Take 1,000 mg by mouth in the morning, at noon, and at bedtime.   Vitamin D-1000 Max St 25  MCG (1000 UT) tablet Generic drug: Cholecalciferol Take 1,000 Units by mouth 3 (three) times daily.      Follow-up Information    Hague, Rosalyn Charters, MD Follow up in 1 week(s).   Specialty: Internal Medicine Contact information: [address] [place] [address] [phone]        Jenean Lindau, MD .   Specialty: Cardiology Contact information: [address] [place] [address] [phone]          Allergies  Allergen Reactions  . Codeine Other (See Comments)    unknown  . Lisinopril Cough  . Contrast Media [Iodinated Diagnostic Agents] Hives and Rash    The results of significant diagnostics from this hospitalization (including  imaging, microbiology, ancillary and laboratory) are listed below for reference.    Microbiology: Recent Results (from the past 240 hour(s))  SARS CORONAVIRUS 2 (TAT 6-24 HRS) Nasopharyngeal Nasopharyngeal Swab     Status: None   Collection Time: 04/08/19  6:08 AM   Specimen: Nasopharyngeal Swab  Result Value Ref Range Status   SARS Coronavirus 2 NEGATIVE NEGATIVE Final    Comment: (NOTE) SARS-CoV-2 target nucleic acids are NOT DETECTED. The SARS-CoV-2 RNA is generally detectable in upper and lower respiratory specimens during the acute phase of infection. Negative results do not preclude SARS-CoV-2 infection, do not rule out co-infections with other pathogens, and should not be used as the sole basis for treatment or other patient management decisions. Negative results must be combined with clinical observations, patient history, and epidemiological information. The expected result is Negative. Fact Sheet for Patients: SugarRoll.be Fact Sheet for Healthcare Providers: https://www.woods-mathews.com/ This test is not yet approved or cleared by the [place] FDA and  has been authorized for detection and/or diagnosis of SARS-CoV-2 by FDA under an Emergency Use  Authorization (EUA). This EUA will remain  in effect (meaning this test can be used) for the duration of the COVID-19 declaration under Section 56 4(b)(1) of the Act, 21 U.S.C. section 360bbb-3(b)(1), unless the authorization is terminated or revoked sooner. Performed at [facility], [place] [address] [place], [place] [address]   Culture, Urine     Status: None   Collection Time: 04/08/19  9:58 AM   Specimen: Urine, Random  Result Value Ref Range Status   Specimen Description URINE, RANDOM  Final   Special Requests SITE NOT SPECIFIED  Final   Culture   Final    NO GROWTH Performed at [facility], [place] [address], [place], [place] [address]    Report Status 04/09/2019 FINAL  Final    Procedures/Studies: DG Chest Port 1 View  Result Date: 04/08/2019 CLINICAL DATA:  Chest pain EXAM: PORTABLE CHEST 1 VIEW COMPARISON:  January 09, 2019 FINDINGS: The heart size and mediastinal contours are within normal limits. Aortic knob calcifications and overlying median sternotomy wires. Tiny calcified granulomata seen within the left lower lung. Otherwise the lungs are clear. The visualized skeletal structures are unremarkable. IMPRESSION: No active disease. Electronically Signed   By: Prudencio Pair M.D.   On: 04/08/2019 01:39   CT RENAL STONE STUDY  Result Date: 04/08/2019 CLINICAL DATA:  Flank pain with kidney stone suspected. Vomiting and episode of diarrhea. EXAM: CT ABDOMEN AND PELVIS WITHOUT CONTRAST TECHNIQUE: Multidetector CT imaging of the abdomen and pelvis was performed following the standard protocol without IV contrast. COMPARISON:  07/22/2017 FINDINGS: Lower chest:  Coronary atherosclerosis. Hepatobiliary: No focal liver abnormality.No evidence of biliary obstruction or stone. Pancreas: Unremarkable. Spleen: Unremarkable. Adrenals/Urinary Tract: Negative adrenals. No hydronephrosis or ureteral stone. Small cystic density is suggested at the lower pole left kidney. The bladder is  decompressed by a Foley catheter. The bladder has an indistinct wall with prominent thickness. High-density within the bladder appears crescentic and not typical of a stone. Stomach/Bowel:  No obstruction. No visible inflammation Vascular/Lymphatic: Diffuse atherosclerotic calcification of the aorta and iliacs. No mass or adenopathy. Reproductive:No pathologic findings. Possible lipoma along the lower right spermatic cord, but only partially covered. No distension of the right upper inguinal canal to suggest this reflects a fatty hernia. Other: No ascites or pneumoperitoneum. Musculoskeletal: No acute abnormalities. Spondylosis and degenerative facet spurring IMPRESSION: 1. No acute finding.  No hydronephrosis or ureteral  calculus. 2. Indistinct bladder wall, please correlate for cystitis symptoms. 3. History of vomiting and diarrhea. No visible bowel inflammation or obstruction. Electronically Signed   By: Monte Fantasia M.D.   On: 04/08/2019 06:00    Labs: BNP (last 3 results) Recent Labs    05/23/18 0640  BNP 300.7*   Basic Metabolic Panel: Recent Labs  Lab 04/08/19 0107 04/08/19 0638 04/09/19 0421 04/10/19 0515  NA 136  --  134* 138  K 3.8  --  3.7 3.9  CL 105  --  104 108  CO2 19*  --  20* 22  GLUCOSE 101*  --  99 99  BUN 16  --  16 10  CREATININE 1.58* 1.64* 1.17 1.03  CALCIUM 8.3*  --  8.8* 8.7*   Liver Function Tests: Recent Labs  Lab 04/08/19 0107  AST 26  ALT 15  ALKPHOS 38  BILITOT 0.6  PROT 6.0*  ALBUMIN 3.3*   Recent Labs  Lab 04/08/19 0107  LIPASE 20   No results for input(s): AMMONIA in the last 168 hours. CBC: Recent Labs  Lab 04/08/19 0107 04/08/19 0638 04/09/19 0421  WBC 5.8 5.8 5.9  NEUTROABS 3.8  --   --   HGB 10.4* 10.3* 10.4*  HCT 31.7* 31.1* 30.5*  MCV 100.6* 99.0 97.8  PLT 208 202 189   Cardiac Enzymes: No results for input(s): CKTOTAL, CKMB, CKMBINDEX, TROPONINI in the last 168 hours. BNP: Invalid input(s): POCBNP CBG: Recent Labs   Lab 04/10/19 1918 04/10/19 2355 04/11/19 0604 04/11/19 0740 04/11/19 1116  GLUCAP 104* 100* 95 108* 88   D-Dimer No results for input(s): DDIMER in the last 72 hours. Hgb A1c No results for input(s): HGBA1C in the last 72 hours. Lipid Profile No results for input(s): CHOL, HDL, LDLCALC, TRIG, CHOLHDL, LDLDIRECT in the last 72 hours. Thyroid function studies Recent Labs    04/10/19 0515  TSH 1.424   Anemia work up No results for input(s): VITAMINB12, FOLATE, FERRITIN, TIBC, IRON, RETICCTPCT in the last 72 hours. Urinalysis    Component Value Date/Time   COLORURINE AMBER (A) 04/08/2019 0750   APPEARANCEUR TURBID (A) 04/08/2019 0750   LABSPEC 1.021 04/08/2019 0750   PHURINE 6.0 04/08/2019 0750   GLUCOSEU NEGATIVE 04/08/2019 0750   HGBUR NEGATIVE 04/08/2019 0750   BILIRUBINUR NEGATIVE 04/08/2019 0750   KETONESUR 20 (A) 04/08/2019 0750   PROTEINUR 100 (A) 04/08/2019 0750   NITRITE NEGATIVE 04/08/2019 0750   LEUKOCYTESUR MODERATE (A) 04/08/2019 0750   Sepsis Labs Invalid input(s): PROCALCITONIN,  WBC,  LACTICIDVEN Microbiology Recent Results (from the past 240 hour(s))  SARS CORONAVIRUS 2 (TAT 6-24 HRS) Nasopharyngeal Nasopharyngeal Swab     Status: None   Collection Time: 04/08/19  6:08 AM   Specimen: Nasopharyngeal Swab  Result Value Ref Range Status   SARS Coronavirus 2 NEGATIVE NEGATIVE Final    Comment: (NOTE) SARS-CoV-2 target nucleic acids are NOT DETECTED. The SARS-CoV-2 RNA is generally detectable in upper and lower respiratory specimens during the acute phase of infection. Negative results do not preclude SARS-CoV-2 infection, do not rule out co-infections with other pathogens, and should not be used as the sole basis for treatment or other patient management decisions. Negative results must be combined with clinical observations, patient history, and epidemiological information. The expected result is Negative. Fact Sheet for  Patients: SugarRoll.be Fact Sheet for Healthcare Providers: https://www.woods-mathews.com/ This test is not yet approved or cleared by the [place] FDA and  has been authorized for detection  and/or diagnosis of SARS-CoV-2 by FDA under an Emergency Use Authorization (EUA). This EUA will remain  in effect (meaning this test can be used) for the duration of the COVID-19 declaration under Section 56 4(b)(1) of the Act, 21 U.S.C. section 360bbb-3(b)(1), unless the authorization is terminated or revoked sooner. Performed at [facility], [place] [address], [place], [place] [address]   Culture, Urine     Status: None   Collection Time: 04/08/19  9:58 AM   Specimen: Urine, Random  Result Value Ref Range Status   Specimen Description URINE, RANDOM  Final   Special Requests SITE NOT SPECIFIED  Final   Culture   Final    NO GROWTH Performed at [facility], [place] [address], [place], [place] [address]    Report Status 04/09/2019 FINAL  Final     Time coordinating discharge: 25  minutes  SIGNED: Antonieta Pert, MD  [facility] 04/11/2019, 1:36 PM  If 7PM-7AM, please contact night-coverage [email or web address]

## 2019-04-11 NOTE — TOC Transition Note (Signed)
Transition of Care [facility]) - CM/SW Discharge Note   Patient Details  Name: Austin Malone MRN: [id number] Date of Birth: May 13, 1940  [facility] CM/SW Contact:  Bethena Roys, RN Phone Number: 04/11/2019, 3:13 PM   Clinical Narrative:  Case Manager received referral for home health services. Case Manager offered choice to the patient with the  Medicare.gov list. Patient did not have a preference of choice. Case Manager called all the agencies on the Medicare.gov list and then some that were not on list. Case Manager called [facility]- unable to staff; [facility]-not in network, [facility] to staff, [facility]- unable to staff, [facility]-unable to staff, [facility]- no staffing availability, [facility]- not in network, [facility] to staff and [facility] outside of service network. [facility] not met his out of pocket expense and patient would have to pay huge amount for each visit.   Case Manager made the patient aware that none of the agencies listed could provide services at this time due to staffing issues and not having insurance in network. Case Manager did offer to set up Outpatient Physical Therapy and the patient declined the service. Patient will call his Primary care office once he gets home to see if they can start looking for home health agency in network. Case Manager asked if a call could be placed to his step-son and pt declined stating that he is at work. Case Manager made the physician aware and the staff RN that services were not set up for home. Patient was appreciative of the Case Manager trying to set him up with [facility]. Patient states that he will have some friends that can check in on him during the day. Patient has transportation home. No further needs at this time.     Final next level of care: Home/Self Care Barriers to Discharge: No Barriers Identified   Patient Goals and  CMS Choice Patient states their goals for this hospitalization and ongoing recovery are:: "to return home"    Discharge Plan and Services In-house Referral: NA Discharge Planning Services: CM Consult Post Acute Care Choice: Home Health(unable to find an agency to accept the patient.)               Readmission Risk Interventions No flowsheet data found.

## 2019-04-11 NOTE — Plan of Care (Signed)
  Problem: Education: Goal: Knowledge of General Education information will improve Description: Including pain rating scale, medication(s)/side effects and non-pharmacologic comfort measures Outcome: Progressing   Problem: Health Behavior/Discharge Planning: Goal: Ability to manage health-related needs will improve Outcome: Progressing   Problem: Activity: Goal: Risk for activity intolerance will decrease Outcome: Progressing   Problem: Nutrition: Goal: Adequate nutrition will be maintained Outcome: Progressing   Problem: Coping: Goal: Level of anxiety will decrease Outcome: Progressing   Problem: Pain Managment: Goal: General experience of comfort will improve Outcome: Progressing   Problem: Safety: Goal: Ability to remain free from injury will improve Outcome: Progressing   Problem: Education: Goal: Knowledge of General Education information will improve Description: Including pain rating scale, medication(s)/side effects and non-pharmacologic comfort measures Outcome: Progressing   

## 2019-04-11 NOTE — Progress Notes (Signed)
Physical Therapy Treatment Patient Details Name: Austin Malone MRN: [id number] DOB: 12-26-1940 Today's Date: 04/11/2019    History of Present Illness 79 y.o. male with a past medical history that includes CAD status post CABG 2008, cardiac cath with PCI in January 2020, chronic indwelling Foley catheter with recurrent UTIs on Levaquin for 2 days, hypertension, chronic anemia scented to the emergency department chief complaint of chest pain    PT Comments    Pt was seen for a short walk, stopped due to his sats being unable to be monitored and was complaining of light headed feelings.  Follow up with a short session today to reassess as his equipment is changed and can be adequately safely monitored.   Follow Up Recommendations  Home health PT     Equipment Recommendations  None recommended by PT    Recommendations for Other Services OT consult     Precautions / Restrictions Precautions Precautions: Fall Precaution Comments: monitor symptoms and telemetry vitals Restrictions Weight Bearing Restrictions: No    Mobility  Bed Mobility Overal bed mobility: Needs Assistance Bed Mobility: Supine to Sit;Sit to Supine     Supine to sit: Min guard Sit to supine: Min guard   General bed mobility comments: min guard for lines and safety  Transfers Overall transfer level: Needs assistance Equipment used: Rolling walker (2 wheeled) Transfers: Sit to/from Stand Sit to Stand: Min assist Stand pivot transfers: Min guard       General transfer comment: cues needed for hand placement  Ambulation/Gait Ambulation/Gait assistance: Min guard Gait Distance (Feet): 100 Feet Assistive device: Rolling walker (2 wheeled) Gait Pattern/deviations: Step-through pattern;Decreased stride length;Wide base of support Gait velocity: dec Gait velocity interpretation: <1.31 ft/sec, indicative of household ambulator General Gait Details: pt is turning slowly, has a drop off of reading of O2 sats  so returned to his room   Stairs             Wheelchair Mobility    Modified Rankin (Stroke Patients Only)       Balance Overall balance assessment: Needs assistance Sitting-balance support: Feet supported Sitting balance-Leahy Scale: Fair     Standing balance support: Bilateral upper extremity supported;During functional activity Standing balance-Leahy Scale: Poor Standing balance comment: walker support is helpful esp with light headed feelings                            Cognition Arousal/Alertness: Awake/alert Behavior During Therapy: WFL for tasks assessed/performed Overall Cognitive Status: No family/caregiver present to determine baseline cognitive functioning                                 General Comments: good awareness of history and limits      Exercises      General Comments General comments (skin integrity, edema, etc.): sats were lost during walk and had to obtain a new finger monitor      Pertinent Vitals/Pain Pain Assessment: Faces Faces Pain Scale: No hurt    Home Living                      Prior Function            PT Goals (current goals can now be found in the care plan section) Acute Rehab PT Goals Patient Stated Goal: get home to dog Progress towards PT goals: Progressing toward goals  Frequency    Min 3X/week      PT Plan Current plan remains appropriate    Co-evaluation              AM-PAC PT "6 Clicks" Mobility   Outcome Measure  Help needed turning from your back to your side while in a flat bed without using bedrails?: None Help needed moving from lying on your back to sitting on the side of a flat bed without using bedrails?: None Help needed moving to and from a bed to a chair (including a wheelchair)?: A Little Help needed standing up from a chair using your arms (e.g., wheelchair or bedside chair)?: A Little Help needed to walk in hospital room?: A Lot Help needed  climbing 3-5 steps with a railing? : A Lot 6 Click Score: 18    End of Session Equipment Utilized During Treatment: Gait belt Activity Tolerance: Patient limited by fatigue;Patient tolerated treatment well Patient left: in bed;with call bell/phone within reach;with bed alarm set Nurse Communication: Mobility status PT Visit Diagnosis: Other abnormalities of gait and mobility (R26.89);Unsteadiness on feet (R26.81)     Time: 5366-4403 PT Time Calculation (min) (ACUTE ONLY): 16 min  Charges:  $Gait Training: 8-22 mins                    Ivar Drape 04/11/2019, 5:00 PM Samul Dada, PT MS Acute Rehab Dept. Number: [facility] [id number] and [facility] [phone]

## 2019-04-12 ENCOUNTER — Other Ambulatory Visit: Payer: Self-pay | Admitting: Cardiology

## 2019-05-23 ENCOUNTER — Other Ambulatory Visit: Payer: Self-pay

## 2019-05-25 ENCOUNTER — Ambulatory Visit (INDEPENDENT_AMBULATORY_CARE_PROVIDER_SITE_OTHER): Payer: Medicare HMO | Admitting: Cardiology

## 2019-05-25 ENCOUNTER — Encounter: Payer: Self-pay | Admitting: Cardiology

## 2019-05-25 ENCOUNTER — Other Ambulatory Visit: Payer: Self-pay

## 2019-05-25 VITALS — BP 114/60 | HR 59 | Temp 97.9°F | Ht 70.0 in | Wt 177.0 lb

## 2019-05-25 DIAGNOSIS — E782 Mixed hyperlipidemia: Secondary | ICD-10-CM

## 2019-05-25 DIAGNOSIS — I10 Essential (primary) hypertension: Secondary | ICD-10-CM

## 2019-05-25 DIAGNOSIS — Z01810 Encounter for preprocedural cardiovascular examination: Secondary | ICD-10-CM

## 2019-05-25 DIAGNOSIS — I25810 Atherosclerosis of coronary artery bypass graft(s) without angina pectoris: Secondary | ICD-10-CM

## 2019-05-25 NOTE — Patient Instructions (Signed)

## 2019-05-25 NOTE — Progress Notes (Signed)
Cardiology Office Note:    Date:  05/25/2019   ID:  Austin Malone, DOB 01/08/1941, MRN [id number]  PCP:  Galvin Proffer, MD  Cardiologist:  Garwin Brothers, MD   Referring MD: Galvin Proffer, MD    ASSESSMENT:    No diagnosis found. PLAN:    In order of problems listed above:  1. Preoperative risk stratification: I discussed my findings with the patient at extensive length.  His effort tolerance is excellent.  Based on this he is not at high risk for coronary events during the aforementioned surgery.  Meticulous hemodynamic monitoring will further reduce the risk of coronary events.  He is clopidogrel may be withheld for up to 5 days before the surgery as part optimal by his urologist.  I would prefer him to continue aspirin in an uninterrupted fashion. 2. Coronary artery disease: Stable at this time as mentioned above coronary angiography report reviewed with him in detail below. 3. Essential hypertension: Blood pressure stable 4. Mixed dyslipidemia: Lipids reviewed by primary care physician will get a copy of that report.  He tells me that he is intolerant to statins and does not remember why. 5. Patient will be seen in follow-up appointment in 6 months or earlier if the patient has any concerns.   Medication Adjustments/Labs and Tests Ordered: Current medicines are reviewed at length with the patient today.  Concerns regarding medicines are outlined above.  No orders of the defined types were placed in this encounter.  No orders of the defined types were placed in this encounter.    Chief Complaint  Patient presents with  . Follow-up    6 MO FU      History of Present Illness:    Austin Malone is a 79 y.o. male.  Patient has past medical history of coronary artery disease, essential hypertension and dyslipidemia.  He denies any problems at this time and takes care of activities of daily living.  He has no chest pain orthopnea or PND.  He mentions to me that he can  walk and does walk regularly 30 minutes without any problems.  At the time of my evaluation, the patient is alert awake oriented and in no distress.  He also wants to get a urological procedure done in the next few weeks.  He is here for preop assessment also.  Past Medical History:  Diagnosis Date  . Abnormal myocardial perfusion study 04/01/2015   Formatting of this note might be different from the original. LCF ischemia-known CTO, EF 30%  . AKI (acute kidney injury) (HCC) 02/19/2018  . Alcohol abuse 08/22/2017  . Anxiety   . Arthritis   . BPH (benign prostatic hyperplasia)   . CAD (coronary artery disease)   . CAD in native artery 06/07/2014   Formatting of this note might be different from the original. Recent [facility] admission 06/02/2014 with abnormal stress echo with inferolateral ischemia at 4.6 Mets and gray zone troponin  0.12  . Cataracts, bilateral   . Chest pain 05/23/2018  . Chest pain at rest 09/29/2017  . Chronic diastolic CHF (congestive heart failure) (HCC) 01/31/2018  . CKD (chronic kidney disease) stage 3, GFR 30-59 ml/min 04/08/2019  . Coronary artery disease involving coronary bypass graft of native heart without angina pectoris 06/17/2014   Cardiac cath 06/18/14: Conclusions Diagnostic Summary 1. Severe Native CAD 2. SVG to Diag occluded 3. SVG to Cir occluded 4. SVG to R-PDA 95% mid body lesion 5. LIMA to LAD patent  6. LVEF is normal Diagnostic Recommendations PCI to SVG-RCA Interventional Summary Successful PCI / Xience Drug Eluting Stent of the mid SVG-RCA graft Interventional Recommendations  Cardiac cath 07/20/14: Conclusions PC  . Enlarged prostate   . Essential hypertension 03/24/2018  . Gallbladder sludge 04/01/2015  . Gastroesophageal reflux disease without esophagitis 07/21/2017  . GERD (gastroesophageal reflux disease)   . Glaucoma   . 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 Hyperglycemia 10/23/2018  . Hyperlipidemia   . Hypertension   . 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 Nausea & vomiting 04/08/2019  . Non-ST elevation (NSTEMI) myocardial infarction (HCC) 08/21/2017  . Normocytic anemia, not due to blood loss 02/19/2018  . Orthostatic hypotension 04/10/2019  . Pericarditis 02/03/2018  . Pneumonia   . Pre-operative clearance 09/08/2017  . Preoperative cardiovascular examination 09/17/2018  . PUD (peptic ulcer disease)   . 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 Rotator cuff tear   . Upper GI bleed 02/19/2018  . UTI (urinary tract infection) 03/2019    Past Surgical History:  Procedure Laterality Date  . APPENDECTOMY    . COLONOSCOPY WITH PROPOFOL N/A 02/21/2018   Procedure: COLONOSCOPY WITH PROPOFOL;  Surgeon: Sherrilyn Rist, MD;  Location: [facility] ENDOSCOPY;  Service: Gastroenterology;  Laterality: N/A;  . CORONARY ANGIOPLASTY WITH STENT PLACEMENT  11/2014   PCI of SVG to PDA x 2 with Xience DES to proximal stenosis, Ultra 5.0 BMS to distal thrombotic lesion  . CORONARY ARTERY BYPASS GRAFT  2008  . CORONARY ATHERECTOMY N/A 02/01/2018   Procedure: CORONARY ATHERECTOMY;  Surgeon: Yvonne Kendall, MD;  Location: [facility] INVASIVE CV LAB;  Service: Cardiovascular;  Laterality: N/A;  . CORONARY STENT INTERVENTION N/A 02/01/2018   Procedure: CORONARY STENT INTERVENTION;  Surgeon: Yvonne Kendall, MD;  Location: [facility] INVASIVE CV LAB;  Service: Cardiovascular;  Laterality: N/A;  . ESOPHAGOGASTRODUODENOSCOPY (EGD) WITH PROPOFOL N/A 02/20/2018   Procedure: ESOPHAGOGASTRODUODENOSCOPY (EGD) WITH PROPOFOL;  Surgeon: Sherrilyn Rist, MD;  Location: [facility] ENDOSCOPY;  Service: Gastroenterology;  Laterality: N/A;  . EYE SURGERY    . INTRAVASCULAR ULTRASOUND/IVUS N/A 02/01/2018   Procedure: Intravascular Ultrasound/IVUS;  Surgeon: Yvonne Kendall, MD;  Location: [facility] INVASIVE CV LAB;  Service: Cardiovascular;  Laterality: N/A;  . LEFT HEART CATH AND CORS/GRAFTS ANGIOGRAPHY N/A 08/21/2017   Procedure: LEFT HEART CATH AND CORS/GRAFTS ANGIOGRAPHY;  Surgeon: Yvonne Kendall, MD;  Location: [facility] INVASIVE CV  LAB;  Service: Cardiovascular;  Laterality: N/A;  . LEFT HEART CATH AND CORS/GRAFTS ANGIOGRAPHY N/A 02/01/2018   Procedure: LEFT HEART CATH AND CORS/GRAFTS ANGIOGRAPHY;  Surgeon: Yvonne Kendall, MD;  Location: [facility] INVASIVE CV LAB;  Service: Cardiovascular;  Laterality: N/A;  . LEFT HEART CATH AND CORS/GRAFTS ANGIOGRAPHY N/A 05/24/2018   Procedure: LEFT HEART CATH AND CORS/GRAFTS ANGIOGRAPHY;  Surgeon: Runell Gess, MD;  Location: [facility] INVASIVE CV LAB;  Service: Cardiovascular;  Laterality: N/A;  . LEG SURGERY    . TONSILLECTOMY      Current Medications: Current Meds  Medication Sig  . alfuzosin (UROXATRAL) 10 MG 24 hr tablet TAKE 1 TABLET BY MOUTH ONCE (1) DAILY FOR bph  . ALPRAZolam (XANAX) 0.5 MG tablet Take 0.5 mg by mouth 2 (two) times daily as needed for anxiety or sleep.   . Ascorbic Acid (VITAMIN C) 1000 MG tablet Take 1,000 mg by mouth in the morning, at noon, and at bedtime.  Marland Kitchen aspirin EC 81 MG tablet Take 81 mg by mouth daily.  Marland Kitchen b complex vitamins tablet Take 1 tablet by mouth  daily.  . bethanechol (URECHOLINE) 50 MG tablet Take 50 mg by mouth 3 (three) times daily.   . Calcium-Magnesium-Zinc (CAL-MAG-ZINC PO) Take 1 tablet by mouth in the morning, at noon, and at bedtime.  . carvedilol (COREG) 6.25 MG tablet Take 1 tablet by mouth 2 (two) times daily.  . Cholecalciferol (VITAMIN D-1000 MAX ST) 25 MCG (1000 UT) tablet Take 1,000 Units by mouth 3 (three) times daily.   . clopidogrel (PLAVIX) 75 MG tablet TAKE 1 TABLET BY MOUTH ONCE (1) DAILY  . Coenzyme Q10 200 MG capsule Take 200 mg by mouth daily.   Marland Kitchen docusate sodium (COLACE) 100 MG capsule Take 1 capsule (100 mg total) by mouth daily.  . dorzolamide-timolol (COSOPT) 22.3-6.8 MG/ML ophthalmic solution Place 1 drop into both eyes 2 (two) times daily.  Marland Kitchen ezetimibe (ZETIA) 10 MG tablet TAKE 1 TABLET BY MOUTH ONCE (1) DAILY  . finasteride (PROSCAR) 5 MG tablet Take 1 tablet (5 mg total) by mouth daily.  . folic acid (FOLVITE) 1  MG tablet Take 1 mg by mouth daily.  . isosorbide mononitrate (IMDUR) 60 MG 24 hr tablet Take 1 tablet (60 mg total) by mouth daily.  Marland Kitchen linaclotide (LINZESS) 290 MCG CAPS capsule Take 290 mcg by mouth daily before breakfast.  . Melatonin 12 MG TABS Take 120 mg by mouth at bedtime.  . Multiple Vitamins-Minerals (MULTIVITAMIN WITH MINERALS) tablet Take 1 tablet by mouth daily.  . nitroGLYCERIN (NITROSTAT) 0.4 MG SL tablet DISSOLVE 1 TABLET UNDER THE TONGUE EVERY 5 MINUTES AS NEEDED FOR CHEST PAIN. (MAXIMUM OF 3 DOSES)  . ondansetron (ZOFRAN) 8 MG tablet Take 8 mg by mouth 2 (two) times daily.   . ranolazine (RANEXA) 1000 MG SR tablet Take 1,000 mg by mouth 2 (two) times daily.   . traMADol (ULTRAM) 50 MG tablet Take 50 mg by mouth 2 (two) times daily as needed.  . TRAVATAN Z 0.004 % SOLN ophthalmic solution Place 1 drop into both eyes at bedtime.      Allergies:   Codeine, Lisinopril, and Contrast media [iodinated diagnostic agents]   Social History   Socioeconomic History  . Marital status: Widowed    Spouse name: Not on file  . Number of children: Not on file  . Years of education: Not on file  . Highest education level: Not on file  Occupational History  . Occupation: Retired  Tobacco Use  . Smoking status: Former Games developer  . Smokeless tobacco: Never Used  Substance and Sexual Activity  . Alcohol use: Yes    Alcohol/week: 21.0 standard drinks    Types: 21 Cans of beer per week  . Drug use: Yes    Types: Marijuana  . Sexual activity: Not on file  Other Topics Concern  . Not on file  Social History Narrative   He lives in [place], [place] alone.   Social Determinants of Health   Financial Resource Strain:   . Difficulty of Paying Living Expenses:   Food Insecurity:   . Worried About Programme researcher, broadcasting/film/video in the Last Year:   . Barista in the Last Year:   Transportation Needs:   . Freight forwarder (Medical):   Marland Kitchen Lack of Transportation (Non-Medical):    Physical Activity:   . Days of Exercise per Week:   . Minutes of Exercise per Session:   Stress:   . Feeling of Stress :   Social Connections:   . Frequency of Communication with Friends and  Family:   . Frequency of Social Gatherings with Friends and Family:   . Attends Religious Services:   . Active Member of Clubs or Organizations:   . Attends Archivist Meetings:   Marland Kitchen Marital Status:      Family History: The patient's family history includes Diabetes in his mother; Heart attack in his brother.  ROS:   Please see the history of present illness.    All other systems reviewed and are negative.  EKGs/Labs/Other Studies Reviewed:    The following studies were reviewed today: LEFT HEART CATH AND CORS/GRAFTS ANGIOGRAPHY  Conclusion    Ost LM to Mid LM lesion is 99% stenosed.  Ost Cx to Prox Cx lesion is 100% stenosed.  Ost LAD to Prox LAD lesion is 70% stenosed.  Ost Ramus lesion is 99% stenosed.  Ost RPDA lesion is 100% stenosed.  Previously placed Prox RCA to Mid RCA stent (unknown type) is widely patent.  Previously placed Post Atrio stent (unknown type) is widely patent.  Ost RCA to Prox RCA lesion is 30% stenosed.  Previously placed Prox Graft to Mid Graft stent (unknown type) is widely patent.  Dist Graft to Insertion lesion is 30% stenosed.  Origin to Prox Graft lesion is 100% stenosed.  Origin to Prox Graft lesion is 100% stenosed.   Austin Malone is a 79 y.o. male    [id number] LOCATION:  FACILITY: [facility]  PHYSICIAN: Quay Burow, M.D. 1941/01/01       Recent Labs: 04/08/2019: ALT 15 04/09/2019: Hemoglobin 10.4; Platelets 189 04/10/2019: BUN 10; Creatinine, Ser 1.03; Potassium 3.9; Sodium 138; TSH 1.424  Recent Lipid Panel    Component Value Date/Time   CHOL 176 02/04/2018 0230   CHOL 184 09/18/2017 1442   TRIG 96 02/04/2018 0230   HDL 60 02/04/2018 0230   HDL 71 09/18/2017 1442   CHOLHDL 2.9 02/04/2018 0230   VLDL 19  02/04/2018 0230   LDLCALC 97 02/04/2018 0230   LDLCALC 92 09/18/2017 1442    Physical Exam:    VS:  BP 114/60   Pulse (!) 59   Temp 97.9 F (36.6 C)   Ht 5\' 10"  (1.778 m)   Wt 177 lb (80.3 kg)   SpO2 99%   BMI 25.40 kg/m     Wt Readings from Last 3 Encounters:  05/25/19 177 lb (80.3 kg)  04/11/19 166 lb 8 oz (75.5 kg)  11/17/18 184 lb (83.5 kg)     GEN: Patient is in no acute distress HEENT: Normal NECK: No JVD; No carotid bruits LYMPHATICS: No lymphadenopathy CARDIAC: Hear sounds regular, 2/6 systolic murmur at the apex. RESPIRATORY:  Clear to auscultation without rales, wheezing or rhonchi  ABDOMEN: Soft, non-tender, non-distended MUSCULOSKELETAL:  No edema; No deformity  SKIN: Warm and dry NEUROLOGIC:  Alert and oriented x 3 PSYCHIATRIC:  Normal affect   Signed, Jenean Lindau, MD  05/25/2019 3:30 PM    [place]

## 2019-05-27 ENCOUNTER — Other Ambulatory Visit: Payer: Self-pay | Admitting: Cardiology

## 2019-08-08 ENCOUNTER — Other Ambulatory Visit: Payer: Self-pay

## 2019-08-08 ENCOUNTER — Other Ambulatory Visit: Payer: Self-pay | Admitting: Cardiovascular Disease

## 2019-08-08 MED ORDER — NITROGLYCERIN 0.4 MG SL SUBL: SUBLINGUAL_TABLET | SUBLINGUAL | 2 refills | Status: DC

## 2019-08-08 NOTE — Telephone Encounter (Signed)
Refill sent in per request.  

## 2019-08-08 NOTE — Telephone Encounter (Signed)
*  STAT* If patient is at the pharmacy, call can be transferred to refill team.   1. Which medications need to be refilled? (please list name of each medication and dose if known) nitroGLYCERIN (NITROSTAT) 0.4 MG SL tablet   2. Which pharmacy/location (including street and city if local pharmacy) is medication to be sent to? [facility] [address] - [place], [place] - [address]  3. Do they need a 30 day or 90 day supply? 90

## 2019-08-08 NOTE — Telephone Encounter (Signed)
Refill sent for Nitroglycerin to [facility]

## 2019-08-19 ENCOUNTER — Telehealth: Payer: Self-pay | Admitting: Cardiology

## 2019-08-19 NOTE — Telephone Encounter (Signed)
Pt c/o of Chest Pain: STAT if CP now or developed within 24 hours  1. Are you having CP right now? yes  2. Are you experiencing any other symptoms (ex. SOB, nausea, vomiting, sweating)? no  3. How long have you been experiencing CP? ~ 3 days.   4. Is your CP continuous or coming and going? Comes and goes   5. Have you taken Nitroglycerin? Yes last night. Pt does not remember exactly what time  ?

## 2019-08-19 NOTE — Telephone Encounter (Signed)
FYI  Called and spoke with pt who states that he has chest pain that starts below the clavicle radiates into chest and down the left arm. Pt states that he has taken several nitroglycerin and it relives but soon returns. Pt states that he does not have nausea/vomiting or diaphoresis. Pt is notable short of breath when on the phone. Pt advised to go to the ED. Pt verbalized understanding and will call 911 and go to the ED. Pt advised to take his aspirin 324 mg as he has not taken today.

## 2019-08-22 ENCOUNTER — Emergency Department (HOSPITAL_COMMUNITY): Payer: Medicare HMO

## 2019-08-22 ENCOUNTER — Inpatient Hospital Stay (HOSPITAL_COMMUNITY)
Admission: EM | Admit: 2019-08-22 | Discharge: 2019-08-25 | DRG: 246 | Disposition: A | Discharge status: Home / Self Care | Payer: Medicare HMO | Attending: Cardiology | Admitting: Cardiology

## 2019-08-22 ENCOUNTER — Inpatient Hospital Stay (HOSPITAL_COMMUNITY)
Admission: EM | Disposition: A | Discharge status: Home / Self Care | Payer: Self-pay | Source: Home / Self Care | Attending: Cardiology

## 2019-08-22 ENCOUNTER — Encounter (HOSPITAL_COMMUNITY): Payer: Self-pay | Admitting: *Deleted

## 2019-08-22 ENCOUNTER — Other Ambulatory Visit: Payer: Self-pay

## 2019-08-22 DIAGNOSIS — Z7982 Long term (current) use of aspirin: Secondary | ICD-10-CM

## 2019-08-22 DIAGNOSIS — Z20822 Contact with and (suspected) exposure to covid-19: Secondary | ICD-10-CM | POA: Diagnosis present

## 2019-08-22 DIAGNOSIS — N390 Urinary tract infection, site not specified: Secondary | ICD-10-CM | POA: Diagnosis present

## 2019-08-22 DIAGNOSIS — I480 Paroxysmal atrial fibrillation: Secondary | ICD-10-CM | POA: Diagnosis present

## 2019-08-22 DIAGNOSIS — I25710 Atherosclerosis of autologous vein coronary artery bypass graft(s) with unstable angina pectoris: Secondary | ICD-10-CM | POA: Diagnosis not present

## 2019-08-22 DIAGNOSIS — Z8711 Personal history of peptic ulcer disease: Secondary | ICD-10-CM | POA: Diagnosis not present

## 2019-08-22 DIAGNOSIS — I252 Old myocardial infarction: Secondary | ICD-10-CM

## 2019-08-22 DIAGNOSIS — Y831 Surgical operation with implant of artificial internal device as the cause of abnormal reaction of the patient, or of later complication, without mention of misadventure at the time of the procedure: Secondary | ICD-10-CM | POA: Diagnosis present

## 2019-08-22 DIAGNOSIS — D649 Anemia, unspecified: Secondary | ICD-10-CM | POA: Diagnosis present

## 2019-08-22 DIAGNOSIS — K219 Gastro-esophageal reflux disease without esophagitis: Secondary | ICD-10-CM | POA: Diagnosis present

## 2019-08-22 DIAGNOSIS — I5032 Chronic diastolic (congestive) heart failure: Secondary | ICD-10-CM | POA: Diagnosis present

## 2019-08-22 DIAGNOSIS — T82855A Stenosis of coronary artery stent, initial encounter: Secondary | ICD-10-CM | POA: Diagnosis present

## 2019-08-22 DIAGNOSIS — I351 Nonrheumatic aortic (valve) insufficiency: Secondary | ICD-10-CM | POA: Diagnosis not present

## 2019-08-22 DIAGNOSIS — E876 Hypokalemia: Secondary | ICD-10-CM | POA: Diagnosis not present

## 2019-08-22 DIAGNOSIS — I200 Unstable angina: Secondary | ICD-10-CM

## 2019-08-22 DIAGNOSIS — N183 Chronic kidney disease, stage 3 unspecified: Secondary | ICD-10-CM | POA: Diagnosis present

## 2019-08-22 DIAGNOSIS — I2582 Chronic total occlusion of coronary artery: Secondary | ICD-10-CM | POA: Diagnosis present

## 2019-08-22 DIAGNOSIS — I25110 Atherosclerotic heart disease of native coronary artery with unstable angina pectoris: Secondary | ICD-10-CM | POA: Diagnosis present

## 2019-08-22 DIAGNOSIS — E7800 Pure hypercholesterolemia, unspecified: Secondary | ICD-10-CM | POA: Diagnosis not present

## 2019-08-22 DIAGNOSIS — I97190 Other postprocedural cardiac functional disturbances following cardiac surgery: Principal | ICD-10-CM | POA: Diagnosis present

## 2019-08-22 DIAGNOSIS — I130 Hypertensive heart and chronic kidney disease with heart failure and stage 1 through stage 4 chronic kidney disease, or unspecified chronic kidney disease: Secondary | ICD-10-CM | POA: Diagnosis present

## 2019-08-22 DIAGNOSIS — Z8249 Family history of ischemic heart disease and other diseases of the circulatory system: Secondary | ICD-10-CM | POA: Diagnosis not present

## 2019-08-22 DIAGNOSIS — N400 Enlarged prostate without lower urinary tract symptoms: Secondary | ICD-10-CM | POA: Diagnosis present

## 2019-08-22 DIAGNOSIS — E782 Mixed hyperlipidemia: Secondary | ICD-10-CM | POA: Diagnosis present

## 2019-08-22 DIAGNOSIS — I340 Nonrheumatic mitral (valve) insufficiency: Secondary | ICD-10-CM | POA: Diagnosis not present

## 2019-08-22 DIAGNOSIS — Z7902 Long term (current) use of antithrombotics/antiplatelets: Secondary | ICD-10-CM

## 2019-08-22 DIAGNOSIS — I10 Essential (primary) hypertension: Secondary | ICD-10-CM | POA: Diagnosis present

## 2019-08-22 DIAGNOSIS — I25700 Atherosclerosis of coronary artery bypass graft(s), unspecified, with unstable angina pectoris: Secondary | ICD-10-CM | POA: Diagnosis not present

## 2019-08-22 DIAGNOSIS — Z833 Family history of diabetes mellitus: Secondary | ICD-10-CM

## 2019-08-22 DIAGNOSIS — Z955 Presence of coronary angioplasty implant and graft: Secondary | ICD-10-CM | POA: Diagnosis not present

## 2019-08-22 DIAGNOSIS — I5033 Acute on chronic diastolic (congestive) heart failure: Secondary | ICD-10-CM | POA: Diagnosis present

## 2019-08-22 DIAGNOSIS — Z79899 Other long term (current) drug therapy: Secondary | ICD-10-CM

## 2019-08-22 DIAGNOSIS — Z951 Presence of aortocoronary bypass graft: Secondary | ICD-10-CM

## 2019-08-22 DIAGNOSIS — Z87440 Personal history of urinary (tract) infections: Secondary | ICD-10-CM

## 2019-08-22 DIAGNOSIS — I2510 Atherosclerotic heart disease of native coronary artery without angina pectoris: Secondary | ICD-10-CM | POA: Diagnosis present

## 2019-08-22 DIAGNOSIS — F1010 Alcohol abuse, uncomplicated: Secondary | ICD-10-CM | POA: Diagnosis present

## 2019-08-22 HISTORY — PX: LEFT HEART CATH AND CORS/GRAFTS ANGIOGRAPHY: CATH118250

## 2019-08-22 HISTORY — PX: CORONARY STENT INTERVENTION: CATH118234

## 2019-08-22 HISTORY — DX: Unstable angina: I20.0

## 2019-08-22 LAB — CBC WITH DIFFERENTIAL/PLATELET
Abs Immature Granulocytes: 0.02 10*3/uL (ref 0.00–0.07)
Basophils Absolute: 0 10*3/uL (ref 0.0–0.1)
Basophils Relative: 1 %
Eosinophils Absolute: 0.1 10*3/uL (ref 0.0–0.5)
Eosinophils Relative: 2 %
HCT: 30.1 % — ABNORMAL LOW (ref 39.0–52.0)
Hemoglobin: 10.2 g/dL — ABNORMAL LOW (ref 13.0–17.0)
Immature Granulocytes: 0 %
Lymphocytes Relative: 22 %
Lymphs Abs: 1.5 10*3/uL (ref 0.7–4.0)
MCH: 34.3 pg — ABNORMAL HIGH (ref 26.0–34.0)
MCHC: 33.9 g/dL (ref 30.0–36.0)
MCV: 101.3 fL — ABNORMAL HIGH (ref 80.0–100.0)
Monocytes Absolute: 0.5 10*3/uL (ref 0.1–1.0)
Monocytes Relative: 8 %
Neutro Abs: 4.4 10*3/uL (ref 1.7–7.7)
Neutrophils Relative %: 67 %
Platelets: 172 10*3/uL (ref 150–400)
RBC: 2.97 MIL/uL — ABNORMAL LOW (ref 4.22–5.81)
RDW: 13.7 % (ref 11.5–15.5)
WBC: 6.6 10*3/uL (ref 4.0–10.5)
nRBC: 0 % (ref 0.0–0.2)

## 2019-08-22 LAB — POCT ACTIVATED CLOTTING TIME
Activated Clotting Time: 290 seconds
Activated Clotting Time: 301 seconds
Activated Clotting Time: 296 seconds

## 2019-08-22 LAB — BRAIN NATRIURETIC PEPTIDE: B Natriuretic Peptide: 471 pg/mL — ABNORMAL HIGH (ref 0.0–100.0)

## 2019-08-22 LAB — COMPREHENSIVE METABOLIC PANEL
ALT: 15 U/L (ref 0–44)
AST: 17 U/L (ref 15–41)
Albumin: 3.2 g/dL — ABNORMAL LOW (ref 3.5–5.0)
Alkaline Phosphatase: 36 U/L — ABNORMAL LOW (ref 38–126)
Anion gap: 10 (ref 5–15)
BUN: 10 mg/dL (ref 8–23)
CO2: 24 mmol/L (ref 22–32)
Calcium: 8.7 mg/dL — ABNORMAL LOW (ref 8.9–10.3)
Chloride: 101 mmol/L (ref 98–111)
Creatinine, Ser: 0.98 mg/dL (ref 0.61–1.24)
GFR calc Af Amer: >60 mL/min (ref >60)
GFR calc non Af Amer: >60 mL/min (ref >60)
Glucose, Bld: 97 mg/dL (ref 70–99)
Potassium: 3.4 mmol/L — ABNORMAL LOW (ref 3.5–5.1)
Sodium: 135 mmol/L (ref 135–145)
Total Bilirubin: 0.5 mg/dL (ref 0.3–1.2)
Total Protein: 5.8 g/dL — ABNORMAL LOW (ref 6.5–8.1)

## 2019-08-22 LAB — TROPONIN I (HIGH SENSITIVITY)
Troponin I (High Sensitivity): 8 ng/L (ref <18)
Troponin I (High Sensitivity): 10 ng/L (ref <18)

## 2019-08-22 LAB — HEMOGLOBIN A1C
Hgb A1c MFr Bld: 5.4 % (ref 4.8–5.6)
Mean Plasma Glucose: 108.28 mg/dL

## 2019-08-22 LAB — MAGNESIUM: Magnesium: 2 mg/dL (ref 1.7–2.4)

## 2019-08-22 LAB — SARS CORONAVIRUS 2 BY RT PCR (HOSPITAL ORDER, PERFORMED IN ~~LOC~~ HOSPITAL LAB): SARS Coronavirus 2: NEGATIVE

## 2019-08-22 LAB — TSH: TSH: 1.482 u[IU]/mL (ref 0.350–4.500)

## 2019-08-22 SURGERY — LEFT HEART CATH AND CORS/GRAFTS ANGIOGRAPHY
Anesthesia: LOCAL

## 2019-08-22 MED ORDER — LIDOCAINE HCL (PF) 1 % IJ SOLN
INTRAMUSCULAR | Status: AC
  Filled 2019-08-22: qty 30

## 2019-08-22 MED ORDER — CLOPIDOGREL BISULFATE 300 MG PO TABS
ORAL_TABLET | ORAL | Status: DC | PRN
  Administered 2019-08-22: 300 mg via ORAL

## 2019-08-22 MED ORDER — CLOPIDOGREL BISULFATE 300 MG PO TABS
ORAL_TABLET | ORAL | Status: AC
  Filled 2019-08-22: qty 1

## 2019-08-22 MED ORDER — SODIUM CHLORIDE 0.9 % IV SOLN: INTRAVENOUS | Status: DC

## 2019-08-22 MED ORDER — HEPARIN SODIUM (PORCINE) 1000 UNIT/ML IJ SOLN
INTRAMUSCULAR | Status: AC
  Filled 2019-08-22: qty 1

## 2019-08-22 MED ORDER — ZOLPIDEM TARTRATE 5 MG PO TABS: 5.0000 mg | ORAL_TABLET | Freq: Every evening | ORAL | Status: DC | PRN

## 2019-08-22 MED ORDER — SODIUM CHLORIDE 0.9% FLUSH
3.0000 mL | Freq: Two times a day (BID) | INTRAVENOUS | Status: DC
  Administered 2019-08-22 – 2019-08-24 (×2): 3 mL via INTRAVENOUS

## 2019-08-22 MED ORDER — MIDAZOLAM HCL 2 MG/2ML IJ SOLN
INTRAMUSCULAR | Status: AC
  Filled 2019-08-22: qty 2

## 2019-08-22 MED ORDER — TRAMADOL HCL 50 MG PO TABS: 50.0000 mg | ORAL_TABLET | Freq: Two times a day (BID) | ORAL | Status: DC | PRN

## 2019-08-22 MED ORDER — ACETAMINOPHEN 325 MG PO TABS: 650.0000 mg | ORAL_TABLET | ORAL | Status: DC | PRN

## 2019-08-22 MED ORDER — NITROGLYCERIN 0.4 MG SL SUBL
0.4000 mg | SUBLINGUAL_TABLET | SUBLINGUAL | Status: DC | PRN
  Administered 2019-08-23 – 2019-08-25 (×10): 0.4 mg via SUBLINGUAL
  Filled 2019-08-22 (×4): qty 1

## 2019-08-22 MED ORDER — NITROGLYCERIN 1 MG/10 ML FOR IR/CATH LAB
INTRA_ARTERIAL | Status: DC | PRN
  Administered 2019-08-22 (×3): 200 ug via INTRACORONARY

## 2019-08-22 MED ORDER — FINASTERIDE 5 MG PO TABS
5.0000 mg | ORAL_TABLET | Freq: Every day | ORAL | Status: DC
  Administered 2019-08-23 – 2019-08-25 (×3): 5 mg via ORAL
  Filled 2019-08-22 (×3): qty 1

## 2019-08-22 MED ORDER — LINACLOTIDE 145 MCG PO CAPS
290.0000 ug | ORAL_CAPSULE | Freq: Every day | ORAL | Status: DC
  Administered 2019-08-23 – 2019-08-25 (×3): 290 ug via ORAL
  Filled 2019-08-22 (×3): qty 2

## 2019-08-22 MED ORDER — ALFUZOSIN HCL ER 10 MG PO TB24
10.0000 mg | ORAL_TABLET | Freq: Every day | ORAL | Status: DC
  Administered 2019-08-23 – 2019-08-25 (×3): 10 mg via ORAL
  Filled 2019-08-22 (×4): qty 1

## 2019-08-22 MED ORDER — LIDOCAINE VISCOUS HCL 2 % MT SOLN
15.0000 mL | Freq: Once | OROMUCOSAL | Status: AC
  Administered 2019-08-22: 15 mL via ORAL
  Filled 2019-08-22: qty 15

## 2019-08-22 MED ORDER — SODIUM CHLORIDE 0.9 % IV SOLN
INTRAVENOUS | Status: DC
Start: 2019-08-23 — End: 2019-08-22

## 2019-08-22 MED ORDER — ALUM & MAG HYDROXIDE-SIMETH 200-200-20 MG/5ML PO SUSP
30.0000 mL | Freq: Once | ORAL | Status: AC
  Administered 2019-08-22: 30 mL via ORAL
  Filled 2019-08-22: qty 30

## 2019-08-22 MED ORDER — FENTANYL CITRATE (PF) 100 MCG/2ML IJ SOLN
INTRAMUSCULAR | Status: AC
  Filled 2019-08-22: qty 2

## 2019-08-22 MED ORDER — CARVEDILOL 6.25 MG PO TABS
6.2500 mg | ORAL_TABLET | Freq: Two times a day (BID) | ORAL | Status: DC
  Administered 2019-08-22 – 2019-08-25 (×6): 6.25 mg via ORAL
  Filled 2019-08-22 (×6): qty 1

## 2019-08-22 MED ORDER — SODIUM CHLORIDE 0.9 % IV SOLN
INTRAVENOUS | Status: AC | PRN
  Administered 2019-08-22: 10 mL/h via INTRAVENOUS

## 2019-08-22 MED ORDER — DIPHENHYDRAMINE HCL 50 MG/ML IJ SOLN
25.0000 mg | Freq: Once | INTRAMUSCULAR | Status: AC
  Administered 2019-08-22: 25 mg via INTRAVENOUS
  Filled 2019-08-22: qty 1

## 2019-08-22 MED ORDER — NITROGLYCERIN 0.4 MG SL SUBL
SUBLINGUAL_TABLET | SUBLINGUAL | Status: AC
  Administered 2019-08-22: 0.4 mg via SUBLINGUAL
  Filled 2019-08-22: qty 1

## 2019-08-22 MED ORDER — ONDANSETRON HCL 4 MG/2ML IJ SOLN: 4.0000 mg | Freq: Four times a day (QID) | INTRAMUSCULAR | Status: DC | PRN

## 2019-08-22 MED ORDER — HEPARIN SODIUM (PORCINE) 1000 UNIT/ML IJ SOLN
INTRAMUSCULAR | Status: DC | PRN
  Administered 2019-08-22 (×2): 2000 [IU] via INTRAVENOUS
  Administered 2019-08-22 (×2): 4000 [IU] via INTRAVENOUS

## 2019-08-22 MED ORDER — NITROGLYCERIN 0.4 MG SL SUBL
0.4000 mg | SUBLINGUAL_TABLET | SUBLINGUAL | Status: AC | PRN
  Administered 2019-08-22 (×2): 0.4 mg via SUBLINGUAL
  Filled 2019-08-22: qty 1

## 2019-08-22 MED ORDER — VERAPAMIL HCL 2.5 MG/ML IV SOLN
INTRAVENOUS | Status: AC
  Filled 2019-08-22: qty 2

## 2019-08-22 MED ORDER — PANTOPRAZOLE SODIUM 40 MG PO TBEC
40.0000 mg | DELAYED_RELEASE_TABLET | Freq: Two times a day (BID) | ORAL | Status: DC
  Administered 2019-08-22 – 2019-08-25 (×6): 40 mg via ORAL
  Filled 2019-08-22 (×6): qty 1

## 2019-08-22 MED ORDER — NITROGLYCERIN 0.4 MG SL SUBL
0.4000 mg | SUBLINGUAL_TABLET | SUBLINGUAL | Status: AC | PRN
  Administered 2019-08-22 (×3): 0.4 mg via SUBLINGUAL
  Filled 2019-08-22: qty 1

## 2019-08-22 MED ORDER — ASPIRIN EC 81 MG PO TBEC
81.0000 mg | DELAYED_RELEASE_TABLET | Freq: Every day | ORAL | Status: DC
  Administered 2019-08-23 – 2019-08-25 (×3): 81 mg via ORAL
  Filled 2019-08-22 (×3): qty 1

## 2019-08-22 MED ORDER — HYDRALAZINE HCL 20 MG/ML IJ SOLN: 10.0000 mg | INTRAMUSCULAR | Status: AC | PRN

## 2019-08-22 MED ORDER — LABETALOL HCL 5 MG/ML IV SOLN: 10.0000 mg | INTRAVENOUS | Status: AC | PRN

## 2019-08-22 MED ORDER — ASPIRIN 81 MG PO CHEW
81.0000 mg | CHEWABLE_TABLET | ORAL | Status: DC
Start: 2019-08-23 — End: 2019-08-22

## 2019-08-22 MED ORDER — ASPIRIN 81 MG PO CHEW: 81.0000 mg | CHEWABLE_TABLET | ORAL | Status: DC

## 2019-08-22 MED ORDER — SODIUM CHLORIDE 0.9% FLUSH
3.0000 mL | Freq: Two times a day (BID) | INTRAVENOUS | Status: DC
  Administered 2019-08-23 – 2019-08-24 (×2): 3 mL via INTRAVENOUS

## 2019-08-22 MED ORDER — FENTANYL CITRATE (PF) 100 MCG/2ML IJ SOLN
INTRAMUSCULAR | Status: DC | PRN
  Administered 2019-08-22 (×2): 25 ug via INTRAVENOUS

## 2019-08-22 MED ORDER — ASPIRIN 81 MG PO CHEW: 324.0000 mg | CHEWABLE_TABLET | ORAL | Status: AC

## 2019-08-22 MED ORDER — HEPARIN (PORCINE) 25000 UT/250ML-% IV SOLN
1100.0000 [IU]/h | INTRAVENOUS | Status: DC
  Administered 2019-08-22: 1100 [IU]/h via INTRAVENOUS
  Filled 2019-08-22: qty 250

## 2019-08-22 MED ORDER — NITROGLYCERIN IN D5W 200-5 MCG/ML-% IV SOLN
0.0000 ug/min | INTRAVENOUS | Status: DC
  Filled 2019-08-22: qty 250

## 2019-08-22 MED ORDER — RANOLAZINE ER 500 MG PO TB12
1000.0000 mg | ORAL_TABLET | Freq: Two times a day (BID) | ORAL | Status: DC
  Administered 2019-08-22 – 2019-08-25 (×6): 1000 mg via ORAL
  Filled 2019-08-22 (×8): qty 2

## 2019-08-22 MED ORDER — MIDAZOLAM HCL 2 MG/2ML IJ SOLN
INTRAMUSCULAR | Status: DC | PRN
  Administered 2019-08-22: 1 mg via INTRAVENOUS

## 2019-08-22 MED ORDER — ENOXAPARIN SODIUM 40 MG/0.4ML ~~LOC~~ SOLN
40.0000 mg | SUBCUTANEOUS | Status: DC
  Administered 2019-08-23 – 2019-08-25 (×3): 40 mg via SUBCUTANEOUS
  Filled 2019-08-22 (×3): qty 0.4

## 2019-08-22 MED ORDER — HEPARIN BOLUS VIA INFUSION
4000.0000 [IU] | Freq: Once | INTRAVENOUS | Status: AC
  Administered 2019-08-22: 4000 [IU] via INTRAVENOUS
  Filled 2019-08-22: qty 4000

## 2019-08-22 MED ORDER — SODIUM CHLORIDE 0.9% FLUSH
3.0000 mL | Freq: Two times a day (BID) | INTRAVENOUS | Status: DC
  Administered 2019-08-24: 3 mL via INTRAVENOUS

## 2019-08-22 MED ORDER — SODIUM CHLORIDE 0.9% FLUSH: 3.0000 mL | INTRAVENOUS | Status: DC | PRN

## 2019-08-22 MED ORDER — SODIUM CHLORIDE 0.9 % IV SOLN: INTRAVENOUS | Status: AC

## 2019-08-22 MED ORDER — POTASSIUM CHLORIDE CRYS ER 20 MEQ PO TBCR
20.0000 meq | EXTENDED_RELEASE_TABLET | Freq: Once | ORAL | Status: AC
  Administered 2019-08-22: 20 meq via ORAL
  Filled 2019-08-22: qty 1

## 2019-08-22 MED ORDER — ALPRAZOLAM 0.5 MG PO TABS
0.5000 mg | ORAL_TABLET | Freq: Two times a day (BID) | ORAL | Status: DC | PRN
  Administered 2019-08-22 – 2019-08-25 (×5): 0.5 mg via ORAL
  Filled 2019-08-22 (×6): qty 1

## 2019-08-22 MED ORDER — SODIUM CHLORIDE 0.9 % IV SOLN: 250.0000 mL | INTRAVENOUS | Status: DC | PRN

## 2019-08-22 MED ORDER — ASPIRIN EC 81 MG PO TBEC
81.0000 mg | DELAYED_RELEASE_TABLET | Freq: Every day | ORAL | Status: DC
  Administered 2019-08-22: 81 mg via ORAL
  Filled 2019-08-22: qty 1

## 2019-08-22 MED ORDER — NITROGLYCERIN IN D5W 200-5 MCG/ML-% IV SOLN
INTRAVENOUS | Status: AC
  Administered 2019-08-22: 5 ug/min via INTRAVENOUS
  Filled 2019-08-22: qty 250

## 2019-08-22 MED ORDER — DOCUSATE SODIUM 100 MG PO CAPS
100.0000 mg | ORAL_CAPSULE | Freq: Every day | ORAL | Status: DC
  Administered 2019-08-23 – 2019-08-25 (×3): 100 mg via ORAL
  Filled 2019-08-22 (×3): qty 1

## 2019-08-22 MED ORDER — HEPARIN (PORCINE) IN NACL 1000-0.9 UT/500ML-% IV SOLN
INTRAVENOUS | Status: AC
  Filled 2019-08-22: qty 1000

## 2019-08-22 MED ORDER — HEPARIN (PORCINE) IN NACL 1000-0.9 UT/500ML-% IV SOLN
INTRAVENOUS | Status: DC | PRN
  Administered 2019-08-22 (×2): 500 mL

## 2019-08-22 MED ORDER — CHLORHEXIDINE GLUCONATE CLOTH 2 % EX PADS
6.0000 | MEDICATED_PAD | Freq: Every day | CUTANEOUS | Status: DC
  Administered 2019-08-23 – 2019-08-24 (×2): 6 via TOPICAL

## 2019-08-22 MED ORDER — VERAPAMIL HCL 2.5 MG/ML IV SOLN
INTRAVENOUS | Status: DC | PRN
  Administered 2019-08-22: 10 mL via INTRA_ARTERIAL

## 2019-08-22 MED ORDER — FUROSEMIDE 10 MG/ML IJ SOLN
20.0000 mg | Freq: Two times a day (BID) | INTRAMUSCULAR | Status: DC
  Administered 2019-08-23: 20 mg via INTRAVENOUS
  Filled 2019-08-22 (×2): qty 2

## 2019-08-22 MED ORDER — IOHEXOL 350 MG/ML SOLN
INTRAVENOUS | Status: AC
  Filled 2019-08-22: qty 1

## 2019-08-22 MED ORDER — ONDANSETRON HCL 4 MG PO TABS: 8.0000 mg | ORAL_TABLET | Freq: Two times a day (BID) | ORAL | Status: DC | PRN

## 2019-08-22 MED ORDER — EZETIMIBE 10 MG PO TABS
10.0000 mg | ORAL_TABLET | Freq: Every day | ORAL | Status: DC
  Administered 2019-08-22 – 2019-08-25 (×4): 10 mg via ORAL
  Filled 2019-08-22 (×4): qty 1

## 2019-08-22 MED ORDER — OXYBUTYNIN CHLORIDE ER 10 MG PO TB24
10.0000 mg | ORAL_TABLET | Freq: Every day | ORAL | Status: DC
  Administered 2019-08-22 – 2019-08-25 (×4): 10 mg via ORAL
  Filled 2019-08-22 (×5): qty 1

## 2019-08-22 MED ORDER — BETHANECHOL CHLORIDE 25 MG PO TABS
50.0000 mg | ORAL_TABLET | Freq: Three times a day (TID) | ORAL | Status: DC
  Administered 2019-08-22 – 2019-08-25 (×7): 50 mg via ORAL
  Filled 2019-08-22 (×11): qty 2

## 2019-08-22 MED ORDER — MELATONIN 3 MG PO TABS
12.0000 mg | ORAL_TABLET | Freq: Every day | ORAL | Status: DC
  Administered 2019-08-22 – 2019-08-24 (×3): 12 mg via ORAL
  Filled 2019-08-22 (×5): qty 4

## 2019-08-22 MED ORDER — CLOPIDOGREL BISULFATE 75 MG PO TABS
75.0000 mg | ORAL_TABLET | Freq: Every day | ORAL | Status: DC
  Administered 2019-08-22 – 2019-08-25 (×4): 75 mg via ORAL
  Filled 2019-08-22 (×4): qty 1

## 2019-08-22 MED ORDER — ASPIRIN 300 MG RE SUPP: 300.0000 mg | RECTAL | Status: AC

## 2019-08-22 MED ORDER — NITROGLYCERIN 1 MG/10 ML FOR IR/CATH LAB
INTRA_ARTERIAL | Status: AC
  Filled 2019-08-22: qty 10

## 2019-08-22 MED ORDER — MORPHINE SULFATE (PF) 2 MG/ML IV SOLN
2.0000 mg | Freq: Once | INTRAVENOUS | Status: AC
  Administered 2019-08-22: 2 mg via INTRAVENOUS
  Filled 2019-08-22: qty 1

## 2019-08-22 MED ORDER — METHYLPREDNISOLONE SODIUM SUCC 125 MG IJ SOLR
125.0000 mg | Freq: Once | INTRAMUSCULAR | Status: AC
  Administered 2019-08-22: 125 mg via INTRAVENOUS
  Filled 2019-08-22: qty 2

## 2019-08-22 SURGICAL SUPPLY — 22 items
BALLN SAPPHIRE 2.0X12 (BALLOONS) ×2
BALLN SAPPHIRE ~~LOC~~ 2.5X12 (BALLOONS) ×2 IMPLANT
BALLOON SAPPHIRE 2.0X12 (BALLOONS) ×1 IMPLANT
CATH INFINITI 5 FR IM (CATHETERS) ×2 IMPLANT
CATH INFINITI 5FR MULTPACK ANG (CATHETERS) ×2 IMPLANT
CATHETER LAUNCHER 6FR MP1 (CATHETERS) ×2 IMPLANT
DEVICE RAD COMP TR BAND LRG (VASCULAR PRODUCTS) ×2 IMPLANT
GLIDESHEATH SLEND SS 6F .021 (SHEATH) ×2 IMPLANT
GUIDEWIRE INQWIRE 1.5J.035X260 (WIRE) ×1 IMPLANT
INQWIRE 1.5J .035X260CM (WIRE) ×2
KIT ENCORE 26 ADVANTAGE (KITS) ×2 IMPLANT
KIT HEART LEFT (KITS) ×2 IMPLANT
PACK CARDIAC CATHETERIZATION (CUSTOM PROCEDURE TRAY) ×2 IMPLANT
STENT SYNERGY XD 2.25X16 (Permanent Stent) ×1 IMPLANT
STENT SYNERGY XD 3.0X24 (Permanent Stent) ×1 IMPLANT
SYNERGY XD 2.25X16 (Permanent Stent) ×2 IMPLANT
SYNERGY XD 3.0X24 (Permanent Stent) ×2 IMPLANT
SYR MEDRAD MARK 7 150ML (SYRINGE) ×2 IMPLANT
TRANSDUCER W/STOPCOCK (MISCELLANEOUS) ×2 IMPLANT
TUBING CIL FLEX 10 FLL-RA (TUBING) ×2 IMPLANT
WIRE COUGAR XT STRL 190CM (WIRE) ×2 IMPLANT
WIRE RUNTHROUGH .014X180CM (WIRE) ×2 IMPLANT

## 2019-08-22 NOTE — ED Notes (Signed)
Pt arrived with a foley cath and reported he has had it for years. Foley was placed by out side  provider.

## 2019-08-22 NOTE — ED Provider Notes (Signed)
[facility] Provider Note   CSN: [id number] Arrival date & time: 08/22/19  [id number]     History Chief Complaint  Patient presents with  . Chest Pain    Austin Malone is a 79 y.o. male.  Patient is an 79 year old male with history of coronary artery disease with prior CABG 15 years ago and stenting in 2016.  He presents today for evaluation of chest pain.  Patient states that this has been ongoing for many weeks.  He develops pressure in his chest along with shortness of breath that wakes him from sleep.  Sometimes sitting up in bed makes the sensation go away.  At other times he requires nitroglycerin.  He presents today as these episodes seem to be occurring more and more frequently.  He spoke with his cardiologist who advised him to come to the ER and be evaluated.  He denies any discomfort at present.  The history is provided by the patient.  Chest Pain Pain location:  Substernal area Pain quality: tightness   Pain radiates to:  Does not radiate Pain severity:  Moderate Timing:  Intermittent Progression:  Worsening      Past Medical History:  Diagnosis Date  . Abnormal myocardial perfusion study 04/01/2015   Formatting of this note might be different from the original. LCF ischemia-known CTO, EF 30%  . AKI (acute kidney injury) (HCC) 02/19/2018  . Alcohol abuse 08/22/2017  . Anxiety   . Arthritis   . BPH (benign prostatic hyperplasia)   . CAD (coronary artery disease)   . CAD in native artery 06/07/2014   Formatting of this note might be different from the original. Recent [facility] admission 06/02/2014 with abnormal stress echo with inferolateral ischemia at 4.6 Mets and gray zone troponin  0.12  . Cataracts, bilateral   . Chest pain 05/23/2018  . Chest pain at rest 09/29/2017  . Chronic diastolic CHF (congestive heart failure) (HCC) 01/31/2018  . CKD (chronic kidney disease) stage 3, GFR 30-59 ml/min 04/08/2019  . Coronary artery disease  involving coronary bypass graft of native heart without angina pectoris 06/17/2014   Cardiac cath 06/18/14: Conclusions Diagnostic Summary 1. Severe Native CAD 2. SVG to Diag occluded 3. SVG to Cir occluded 4. SVG to R-PDA 95% mid body lesion 5. LIMA to LAD patent 6. LVEF is normal Diagnostic Recommendations PCI to SVG-RCA Interventional Summary Successful PCI / Xience Drug Eluting Stent of the mid SVG-RCA graft Interventional Recommendations  Cardiac cath 07/20/14: Conclusions PC  . Enlarged prostate   . Essential hypertension 03/24/2018  . Gallbladder sludge 04/01/2015  . Gastroesophageal reflux disease without esophagitis 07/21/2017  . GERD (gastroesophageal reflux disease)   . Glaucoma   . 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 Hyperglycemia 10/23/2018  . Hyperlipidemia   . Hypertension   . 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 Nausea & vomiting 04/08/2019  . Non-ST elevation (NSTEMI) myocardial infarction (HCC) 08/21/2017  . Normocytic anemia, not due to blood loss 02/19/2018  . Orthostatic hypotension 04/10/2019  . Pericarditis 02/03/2018  . Pneumonia   . Pre-operative clearance 09/08/2017  . Preoperative cardiovascular examination 09/17/2018  . PUD (peptic ulcer disease)   . 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 Rotator cuff tear   . Upper GI bleed 02/19/2018  . UTI (urinary tract infection) 03/2019    Patient Active Problem List   Diagnosis Date Noted  . Orthostatic hypotension 04/10/2019  . Nausea & vomiting 04/08/2019  . CKD (chronic kidney disease) stage  3, GFR 30-59 ml/min 04/08/2019  . 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 Hyperglycemia 10/23/2018  . Preoperative cardiovascular examination 09/17/2018  . Chest pain 05/23/2018  . Essential hypertension 03/24/2018  . Upper GI bleed 02/19/2018  . Normocytic anemia, not due to blood loss 02/19/2018  . AKI (acute kidney injury) (HCC) 02/19/2018  . Pericarditis 02/03/2018  . Chronic diastolic CHF (congestive heart failure) (HCC) 01/31/2018  . Chest pain  at rest 09/29/2017  . Pre-operative clearance 09/08/2017  . Alcohol abuse 08/22/2017  . Anxiety 08/22/2017  . BPH (benign prostatic hyperplasia) 07/21/2017  . Gastroesophageal reflux disease without esophagitis 07/21/2017  . 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 Hyperlipidemia 07/21/2017  . 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 Gallbladder sludge 04/01/2015  . Abnormal myocardial perfusion study 04/01/2015  . Coronary artery disease involving coronary bypass graft of native heart without angina pectoris 06/17/2014  . CAD in native artery 06/07/2014    Past Surgical History:  Procedure Laterality Date  . APPENDECTOMY    . COLONOSCOPY WITH PROPOFOL N/A 02/21/2018   Procedure: COLONOSCOPY WITH PROPOFOL;  Surgeon: Sherrilyn Rist, MD;  Location: [facility] ENDOSCOPY;  Service: Gastroenterology;  Laterality: N/A;  . CORONARY ANGIOPLASTY WITH STENT PLACEMENT  11/2014   PCI of SVG to PDA x 2 with Xience DES to proximal stenosis, Ultra 5.0 BMS to distal thrombotic lesion  . CORONARY ARTERY BYPASS GRAFT  2008  . CORONARY ATHERECTOMY N/A 02/01/2018   Procedure: CORONARY ATHERECTOMY;  Surgeon: Yvonne Kendall, MD;  Location: [facility] INVASIVE CV LAB;  Service: Cardiovascular;  Laterality: N/A;  . CORONARY STENT INTERVENTION N/A 02/01/2018   Procedure: CORONARY STENT INTERVENTION;  Surgeon: Yvonne Kendall, MD;  Location: [facility] INVASIVE CV LAB;  Service: Cardiovascular;  Laterality: N/A;  . ESOPHAGOGASTRODUODENOSCOPY (EGD) WITH PROPOFOL N/A 02/20/2018   Procedure: ESOPHAGOGASTRODUODENOSCOPY (EGD) WITH PROPOFOL;  Surgeon: Sherrilyn Rist, MD;  Location: [facility] ENDOSCOPY;  Service: Gastroenterology;  Laterality: N/A;  . EYE SURGERY    . INTRAVASCULAR ULTRASOUND/IVUS N/A 02/01/2018   Procedure: Intravascular Ultrasound/IVUS;  Surgeon: Yvonne Kendall, MD;  Location: [facility] INVASIVE CV LAB;  Service: Cardiovascular;  Laterality: N/A;  . LEFT HEART CATH AND CORS/GRAFTS ANGIOGRAPHY N/A 08/21/2017   Procedure: LEFT HEART  CATH AND CORS/GRAFTS ANGIOGRAPHY;  Surgeon: Yvonne Kendall, MD;  Location: [facility] INVASIVE CV LAB;  Service: Cardiovascular;  Laterality: N/A;  . LEFT HEART CATH AND CORS/GRAFTS ANGIOGRAPHY N/A 02/01/2018   Procedure: LEFT HEART CATH AND CORS/GRAFTS ANGIOGRAPHY;  Surgeon: Yvonne Kendall, MD;  Location: [facility] INVASIVE CV LAB;  Service: Cardiovascular;  Laterality: N/A;  . LEFT HEART CATH AND CORS/GRAFTS ANGIOGRAPHY N/A 05/24/2018   Procedure: LEFT HEART CATH AND CORS/GRAFTS ANGIOGRAPHY;  Surgeon: Runell Gess, MD;  Location: [facility] INVASIVE CV LAB;  Service: Cardiovascular;  Laterality: N/A;  . LEG SURGERY    . TONSILLECTOMY         Family History  Problem Relation Age of Onset  . Diabetes Mother   . Heart attack Brother     Social History   Tobacco Use  . Smoking status: Former Games developer  . Smokeless tobacco: Never Used  Vaping Use  . Vaping Use: Never used  Substance Use Topics  . Alcohol use: Yes    Alcohol/week: 21.0 standard drinks    Types: 21 Cans of beer per week  . Drug use: Yes    Types: Marijuana    Home Medications Prior to Admission medications   Medication Sig Start Date End Date Taking? Authorizing Provider  alfuzosin (  UROXATRAL) 10 MG 24 hr tablet TAKE 1 TABLET BY MOUTH ONCE (1) DAILY FOR bph 06/21/18   [provider]  ALPRAZolam Prudy Feeler) 0.5 MG tablet Take 0.5 mg by mouth 2 (two) times daily as needed for anxiety or sleep.  05/10/14   [provider]  Ascorbic Acid (VITAMIN C) 1000 MG tablet Take 1,000 mg by mouth in the morning, at noon, and at bedtime.    [provider]  aspirin EC 81 MG tablet Take 81 mg by mouth daily.    [provider]  b complex vitamins tablet Take 1 tablet by mouth daily.    [provider]  bethanechol (URECHOLINE) 50 MG tablet Take 50 mg by mouth 3 (three) times daily.  07/26/18   [provider]  Calcium-Magnesium-Zinc (CAL-MAG-ZINC PO) Take 1 tablet by mouth in the morning, at noon, and  at bedtime.    [provider]  carvedilol (COREG) 6.25 MG tablet Take 1 tablet by mouth 2 (two) times daily. 09/01/16   [provider]  Cayenne 450 MG CAPS Take by mouth.    [provider]  Cholecalciferol (VITAMIN D-1000 MAX ST) 25 MCG (1000 UT) tablet Take 1,000 Units by mouth 3 (three) times daily.     [provider]  clopidogrel (PLAVIX) 75 MG tablet TAKE 1 TABLET BY MOUTH ONCE (1) DAILY 05/27/19   Revankar, Aundra Dubin, MD  Coenzyme Q10 200 MG capsule Take 200 mg by mouth daily.     [provider]  docusate sodium (COLACE) 100 MG capsule Take 1 capsule (100 mg total) by mouth daily. 04/11/19   Lanae Boast, MD  dorzolamide-timolol (COSOPT) 22.3-6.8 MG/ML ophthalmic solution Place 1 drop into both eyes 2 (two) times daily. 01/29/18   [provider]  ezetimibe (ZETIA) 10 MG tablet TAKE 1 TABLET BY MOUTH ONCE (1) DAILY 01/13/19   Georgie Chard D, NP  finasteride (PROSCAR) 5 MG tablet Take 1 tablet (5 mg total) by mouth daily. 02/02/18   Rai, Delene Ruffini, MD  folic acid (FOLVITE) 1 MG tablet Take 1 mg by mouth daily.    [provider]  furosemide (LASIX) 20 MG tablet TAKE 1 TABLET BY MOUTH ONCE (1) DAILY 08/13/18   [provider]  isosorbide mononitrate (IMDUR) 60 MG 24 hr tablet Take 1 tablet (60 mg total) by mouth daily. 02/02/18   Rai, Delene Ruffini, MD  levofloxacin (LEVAQUIN) 750 MG tablet Take 750 mg by mouth daily. For 7 days 07/26/18   [provider]  linaclotide (LINZESS) 290 MCG CAPS capsule Take 290 mcg by mouth daily before breakfast.    [provider]  Melatonin 12 MG TABS Take 120 mg by mouth at bedtime.    [provider]  Multiple Vitamins-Minerals (MULTIVITAMIN WITH MINERALS) tablet Take 1 tablet by mouth daily.    [provider]  nitrofurantoin (MACRODANTIN) 100 MG capsule Take 100 mg by mouth 2 (two) times daily. 05/23/19   [provider]  nitroGLYCERIN (NITROSTAT) 0.4  MG SL tablet DISSOLVE 1 TABLET UNDER THE TONGUE EVERY 5 MINUTES AS NEEDED FOR CHEST PAIN. (MAXIMUM OF 3 DOSES) 08/08/19   Revankar, Aundra Dubin, MD  ondansetron (ZOFRAN) 8 MG tablet Take 8 mg by mouth 2 (two) times daily.     [provider]  oxybutynin (DITROPAN-XL) 10 MG 24 hr tablet Take 10 mg by mouth daily. 05/04/19   [provider]  oxyCODONE (OXY IR/ROXICODONE) 5 MG immediate release tablet Take 5 mg by  mouth every 4 (four) hours as needed for severe pain.    [provider]  pantoprazole (PROTONIX) 40 MG tablet Take 1 tablet (40 mg total) by mouth 2 (two) times daily. 04/11/19 05/11/19  Lanae Boast, MD  ranolazine (RANEXA) 1000 MG SR tablet Take 1,000 mg by mouth 2 (two) times daily.  02/25/16   [provider]  traMADol (ULTRAM) 50 MG tablet Take 50 mg by mouth 2 (two) times daily as needed. 04/05/19   [provider]  TRAVATAN Z 0.004 % SOLN ophthalmic solution Place 1 drop into both eyes at bedtime.  06/24/17   [provider]    Allergies    Codeine, Lisinopril, and Contrast media [iodinated diagnostic agents]  Review of Systems   Review of Systems  Cardiovascular: Positive for chest pain.  All other systems reviewed and are negative.   Physical Exam Updated Vital Signs Ht  (1.778 m)   Wt 79.8 kg   BMI 25.25 kg/m   Physical Exam Vitals and nursing note reviewed.  Constitutional:      General: He is not in acute distress.    Appearance: He is well-developed. He is not diaphoretic.  HENT:     Head: Normocephalic and atraumatic.  Cardiovascular:     Rate and Rhythm: Normal rate and regular rhythm.     Heart sounds: No murmur heard.  No friction rub.  Pulmonary:     Effort: Pulmonary effort is normal. No respiratory distress.     Breath sounds: Normal breath sounds. No wheezing or rales.  Abdominal:     General: Bowel sounds are normal. There is no distension.     Palpations: Abdomen is soft.     Tenderness: There is  no abdominal tenderness.  Musculoskeletal:        General: Normal range of motion.     Cervical back: Normal range of motion and neck supple.     Right lower leg: Edema present.     Left lower leg: Edema present.     Comments: There is 2+ pitting edema of both lower extremities.  Skin:    General: Skin is warm and dry.  Neurological:     Mental Status: He is alert and oriented to person, place, and time.     Coordination: Coordination normal.     ED Results / Procedures / Treatments   Labs (all labs ordered are listed, but only abnormal results are displayed) Labs Reviewed - No data to display  EKG None  Radiology No results found.  Procedures Procedures (including critical care time)  Medications Ordered in ED Medications - No data to display  ED Course  I have reviewed the triage vital signs and the nursing notes.  Pertinent labs & imaging results that were available during my care of the patient were reviewed by me and considered in my medical decision making (see chart for details).    MDM Rules/Calculators/A&P  Patient is an 79 year old male with past medical history of coronary artery disease as described in the HPI.  He presents today for evaluation of chest pain.  This has been occurring intermittently for quite some time, however is now occurring much more frequently and severe.  Patient's initial EKG showing no acute changes.  Patient experienced additional discomfort while here in the ER.  During this episode, his EKG was repeated and appears to have ST depressions in the lateral leads.  These resolved after administration of sublingual nitroglycerin.  His work-up is otherwise unremarkable  thus far.  His troponin is negative.  This appears to be an accelerating anginal pattern and I feel as though admission is warranted.  I have spoken with the cardiology team who will evaluate and admit.  Final Clinical Impression(s) / ED Diagnoses Final diagnoses:  None     Rx / DC Orders ED Discharge Orders    None       Geoffery Lyons, MD 08/22/19 1401

## 2019-08-22 NOTE — ED Triage Notes (Signed)
PT reports he has had CP at night since 08-17-19. Pt reported he takes 1 NGT with relief of CP.  Pt took 4 - 81 Mg  ASA  Pt reports SHOB with the CP.  No CP on arrival to ED room. Pt A/O x4.

## 2019-08-22 NOTE — Telephone Encounter (Signed)
agree

## 2019-08-22 NOTE — Progress Notes (Signed)
ANTICOAGULATION CONSULT NOTE - Initial Consult  Pharmacy Consult for heparin Indication: chest pain/ACS  Allergies  Allergen Reactions  . Codeine Other (See Comments)    unknown  . Lisinopril Cough  . Contrast Media [Iodinated Diagnostic Agents] Hives and Rash    Patient Measurements: Height: 5\' 10"  (177.8 cm) Weight: 79.8 kg (176 lb) IBW/kg (Calculated) : 73 Heparin Dosing Weight: 80kg  Vital Signs: Temp: 98.1 F (36.7 C) (08/09 1348) Temp Source: Oral (08/09 1348) BP: 186/91 (08/09 1345) Pulse Rate: 74 (08/09 1345)  Labs: Recent Labs    08/22/19 0942 08/22/19 1142  HGB 10.2*  --   HCT 30.1*  --   PLT 172  --   CREATININE 0.98  --   TROPONINIHS 8 10    Estimated Creatinine Clearance: 62.1 mL/min (by C-G formula based on SCr of 0.98 mg/dL).   Medical History: Past Medical History:  Diagnosis Date  . Abnormal myocardial perfusion study 04/01/2015   Formatting of this note might be different from the original. LCF ischemia-known CTO, EF 30%  . AKI (acute kidney injury) (HCC) 02/19/2018  . Alcohol abuse 08/22/2017  . Anxiety   . Arthritis   . BPH (benign prostatic hyperplasia)   . CAD (coronary artery disease)   . CAD in native artery 06/07/2014   Formatting of this note might be different from the original. Recent [facility] admission 06/02/2014 with abnormal stress echo with inferolateral ischemia at 4.6 Mets and gray zone troponin  0.12  . Cataracts, bilateral   . Chest pain 05/23/2018  . Chest pain at rest 09/29/2017  . Chronic diastolic CHF (congestive heart failure) (HCC) 01/31/2018  . CKD (chronic kidney disease) stage 3, GFR 30-59 ml/min 04/08/2019  . Coronary artery disease involving coronary bypass graft of native heart without angina pectoris 06/17/2014   Cardiac cath 06/18/14: Conclusions Diagnostic Summary 1. Severe Native CAD 2. SVG to Diag occluded 3. SVG to Cir occluded 4. SVG to R-PDA 95% mid body lesion 5. LIMA to LAD patent 6. LVEF is normal Diagnostic  Recommendations PCI to SVG-RCA Interventional Summary Successful PCI / Xience Drug Eluting Stent of the mid SVG-RCA graft Interventional Recommendations  Cardiac cath 07/20/14: Conclusions PC  . Enlarged prostate   . Essential hypertension 03/24/2018  . Gallbladder sludge 04/01/2015  . Gastroesophageal reflux disease without esophagitis 07/21/2017  . GERD (gastroesophageal reflux disease)   . Glaucoma   . 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 Hyperglycemia 10/23/2018  . Hyperlipidemia   . Hypertension   . 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 Nausea & vomiting 04/08/2019  . Non-ST elevation (NSTEMI) myocardial infarction (HCC) 08/21/2017  . Normocytic anemia, not due to blood loss 02/19/2018  . Orthostatic hypotension 04/10/2019  . Pericarditis 02/03/2018  . Pneumonia   . Pre-operative clearance 09/08/2017  . Preoperative cardiovascular examination 09/17/2018  . PUD (peptic ulcer disease)   . 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 Rotator cuff tear   . Upper GI bleed 02/19/2018  . UTI (urinary tract infection) 03/2019    Medications:  Infusions:  . sodium chloride    . sodium chloride    . heparin    . nitroGLYCERIN 5 mcg/min (08/22/19 1347)    Assessment: 79 yom presented to the ED with CP. To start IV heparin. Baseline Hgb is low at 10.2 and platelets are WNL. He is not on anticoagulation PTA.   Goal of Therapy:  Heparin level 0.3-0.7 units/ml Monitor platelets by anticoagulation protocol: Yes   Plan:  Heparin bolus  4000 units IV x 1 Heparin gtt 1100 units/hr Check an 8 hr heparin level Daily heparin level and CBC  Jeweline Reif, Drake Leach 08/22/2019,1:55 PM

## 2019-08-22 NOTE — Interval H&P Note (Signed)
History and Physical Interval Note:  08/22/2019 4:56 PM  Austin Malone  has presented today for surgery, with the diagnosis of unstable angina.  The various methods of treatment have been discussed with the patient and family. After consideration of risks, benefits and other options for treatment, the patient has consented to  Procedure(s): LEFT HEART CATH AND CORS/GRAFTS ANGIOGRAPHY (N/A) as a surgical intervention.  The patient's history has been reviewed, patient examined, no change in status, stable for surgery.  I have reviewed the patient's chart and labs.  Questions were answered to the patient's satisfaction.    Cath Lab Visit (complete for each Cath Lab visit)  Clinical Evaluation Leading to the Procedure:   ACS: Yes.    Non-ACS:  N/A  Austin Malone

## 2019-08-22 NOTE — H&P (Signed)
Cardiology Admission History and Physical:   Patient ID: Austin Malone MRN: [id number]; DOB: 20-Nov-1940   Admission date: 08/22/2019  Primary Care Provider: Galvin Proffer, MD [facility] Cardiologist: Garwin Brothers, MD  [facility] Electrophysiologist:  None   Chief Complaint:  Chest pain  Patient Profile:   Austin Malone is a 79 y.o. male with CAD s/p CABG and prior stenting, HTN, mixed dyslipidemia, chronic indwelling foley with chronic UTI, and chronic diastolic CHF who is being seen for chest pain.   History of Present Illness:   Austin Malone is followed by Dr. Tomie China for the above cardiac issues. He has a history of CAD s/p CABG 15 years ago and stenting in 2016. He had a cath in 05/2018 showing Ost LM to mid LM lesion 99% stenosed, Ost Cx to Prox Cx lesion is 100% stenosed, Ost LAD to Prox LAD lesion is 70% stenosed, Ost ramus lesion 99% stenosed, Ost RPDA 100% stenosed, Ost RCA to pro PCA lesion 30% stenosed, previously placed prox to Mid RCA stent patent, previously placed post atrio stent (unknown type) widely patent, previously placed prox graft to mid graft stent widely patent, dist graft to insertion 30% stenosed, origin to prox graft 100% stenosed. This is relatively unchanged disease compared to post native RCA intervention in January. No culprit lesions at that time for pain however suspected occluded cx and ramus branch. Medical therapy was recommended. Last echo was limited echo which showed LVEF 60-65%, trivial pericardial effusion.   He was last seen 05/25/19 for pre-op assessment for a urological procedure. He was symptomatically stable at that time.   The patient presented to the ED 08/22/19 for chest pain. Patient says he has intermittent chest pain, it can happen every night or it might 4-5 days without it. Pain is in the center of the chest and up towards the clavicles. It radiates down both arms. He has associated sob with the pain. Whenever he had pain  onset he would take a nitro and the pain would resolve in 5 minutes. He called the cardiology office Friday and they recommended ER eval however he wanted to wait to see if it subsided on it's own. He had another episode last night and decided to go to the ER for evaluation. He also reports worsening LLE for the last week. Denies weights gain. Says he normally wears compression stockings   In the ED BP 165/75, RR21, pulse 61, 99%  O2. Labs showed potassium 3.4, glucose 97, creatinine 0.98, albumin 3.2, WBC 6.6, Hgb 10.2. HS troponin 8. BNP 471. CXR unremarkable.Initial EKG showed NSR HR 60 with nonspecific T wave changes. Patient had recurrent chest pain. Given SL nitro x 2 which relieved the pain. EKG showed ST depression in anterolateral leads and cardiology was consulted for possible admission.    Past Medical History:  Diagnosis Date  . Abnormal myocardial perfusion study 04/01/2015   Formatting of this note might be different from the original. LCF ischemia-known CTO, EF 30%  . AKI (acute kidney injury) (HCC) 02/19/2018  . Alcohol abuse 08/22/2017  . Anxiety   . Arthritis   . BPH (benign prostatic hyperplasia)   . CAD (coronary artery disease)   . CAD in native artery 06/07/2014   Formatting of this note might be different from the original. Recent [facility] admission 06/02/2014 with abnormal stress echo with inferolateral ischemia at 4.6 Mets and gray zone troponin  0.12  . Cataracts, bilateral   . Chest pain 05/23/2018  .  Chest pain at rest 09/29/2017  . Chronic diastolic CHF (congestive heart failure) (HCC) 01/31/2018  . CKD (chronic kidney disease) stage 3, GFR 30-59 ml/min 04/08/2019  . Coronary artery disease involving coronary bypass graft of native heart without angina pectoris 06/17/2014   Cardiac cath 06/18/14: Conclusions Diagnostic Summary 1. Severe Native CAD 2. SVG to Diag occluded 3. SVG to Cir occluded 4. SVG to R-PDA 95% mid body lesion 5. LIMA to LAD patent 6. LVEF is normal Diagnostic  Recommendations PCI to SVG-RCA Interventional Summary Successful PCI / Xience Drug Eluting Stent of the mid SVG-RCA graft Interventional Recommendations  Cardiac cath 07/20/14: Conclusions PC  . Enlarged prostate   . Essential hypertension 03/24/2018  . Gallbladder sludge 04/01/2015  . Gastroesophageal reflux disease without esophagitis 07/21/2017  . GERD (gastroesophageal reflux disease)   . Glaucoma   . 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 Hyperglycemia 10/23/2018  . Hyperlipidemia   . Hypertension   . 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 Nausea & vomiting 04/08/2019  . Non-ST elevation (NSTEMI) myocardial infarction (HCC) 08/21/2017  . Normocytic anemia, not due to blood loss 02/19/2018  . Orthostatic hypotension 04/10/2019  . Pericarditis 02/03/2018  . Pneumonia   . Pre-operative clearance 09/08/2017  . Preoperative cardiovascular examination 09/17/2018  . PUD (peptic ulcer disease)   . 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 Rotator cuff tear   . Upper GI bleed 02/19/2018  . UTI (urinary tract infection) 03/2019    Past Surgical History:  Procedure Laterality Date  . APPENDECTOMY    . COLONOSCOPY WITH PROPOFOL N/A 02/21/2018   Procedure: COLONOSCOPY WITH PROPOFOL;  Surgeon: Sherrilyn Rist, MD;  Location: [facility] ENDOSCOPY;  Service: Gastroenterology;  Laterality: N/A;  . CORONARY ANGIOPLASTY WITH STENT PLACEMENT  11/2014   PCI of SVG to PDA x 2 with Xience DES to proximal stenosis, Ultra 5.0 BMS to distal thrombotic lesion  . CORONARY ARTERY BYPASS GRAFT  2008  . CORONARY ATHERECTOMY N/A 02/01/2018   Procedure: CORONARY ATHERECTOMY;  Surgeon: Yvonne Kendall, MD;  Location: [facility] INVASIVE CV LAB;  Service: Cardiovascular;  Laterality: N/A;  . CORONARY STENT INTERVENTION N/A 02/01/2018   Procedure: CORONARY STENT INTERVENTION;  Surgeon: Yvonne Kendall, MD;  Location: [facility] INVAS[facility] CV LAB;  Service: Cardiovascular;  Laterality: N/A;  . ESOPHAGOGASTRODUODENOSCOPY (EGD) WITH PROPOFOL N/A 02/20/2018    Procedure: ESOPHAGOGASTRODUODENOSCOPY (EGD) WITH PROPOFOL;  Surgeon: Sherrilyn Rist, MD;  Location: [facility] ENDOSCOPY;  Service: Gastroenterology;  Laterality: N/A;  . EYE SURGERY    . INTRAVASCULAR ULTRASOUND/IVUS N/A 02/01/2018   Procedure: Intravascular Ultrasound/IVUS;  Surgeon: Yvonne Kendall, MD;  Location: [facility] CV [facility];  Service: Cardiovascular;  Laterality: N/A;  . LEFT HEART CATH AND CORS/GRAFTS ANGIOGRAPHY N/A 08/21/2017   Procedure: LEFT HEART CATH AND CORS/GRAFTS ANGIOGRAPHY;  Surgeon: Yvonne Kendall, MD;  Location: [facility];  Service: Cardiovascular;  Laterality: N/A;  . LEFT HEART CATH AND CORS/GRAFTS ANGIOGRAPHY N/A 02/01/2018   Procedure: LEFT HEART CATH AND CORS/GRAFTS ANGIOGRAPHY;  Surgeon: Yvonne Kendall, MD;  Location: [facility];  Service: Cardiovascular;  Laterality: N/A;  . LEFT HEART CATH AND CORS/GRAFTS ANGIOGRAPHY N/A 05/24/2018   Procedure: LEFT HEART CATH AND CORS/GRAFTS ANGIOGRAPHY;  Surgeon: Runell Gess, MD;  Location: [facility];  Service: Cardiovascular;  Laterality: N/A;  . LEG SURGERY    . TONSILLECTOMY       Medications Prior to Admission: Prior to Admission medications   Medication Sig Start Date End Date Taking?  Authorizing Provider  alfuzosin (UROXATRAL) 10 MG 24 hr tablet Take 10 mg by mouth daily.  06/21/18  Yes [provider]  ALPRAZolam Prudy Feeler(XANAX) 0.5 MG tablet Take 0.5 mg by mouth 2 (two) times daily as needed for anxiety or sleep.  05/10/14  Yes [provider]  Ascorbic Acid (VITAMIN C) 1000 MG tablet Take 1,000 mg by mouth in the morning, at noon, and at bedtime.   Yes [provider]  aspirin EC 81 MG tablet Take 81 mg by mouth daily.   Yes [provider]  b complex vitamins tablet Take 1 tablet by mouth daily.   Yes [provider]  bethanechol (URECHOLINE) 50 MG tablet Take 50 mg by mouth 3 (three) times daily.  07/26/18  Yes [provider]  carvedilol  (COREG) 6.25 MG tablet Take 1 tablet by mouth 2 (two) times daily. 09/01/16  Yes [provider]  Cholecalciferol (VITAMIN D-1000 MAX ST) 25 MCG (1000 UT) tablet Take 1,000 Units by mouth 3 (three) times daily.    Yes [provider]  clopidogrel (PLAVIX) 75 MG tablet TAKE 1 TABLET BY MOUTH ONCE (1) DAILY Patient taking differently: Take 75 mg by mouth daily.  05/27/19  Yes Revankar, Aundra Dubinajan R, MD  docusate sodium (COLACE) 100 MG capsule Take 1 capsule (100 mg total) by mouth daily. 04/11/19  Yes Lanae BoastKc, Ramesh, MD  dorzolamide-timolol (COSOPT) 22.3-6.8 MG/ML ophthalmic solution Place 1 drop into both eyes 2 (two) times daily. 01/29/18  Yes [provider]  ezetimibe (ZETIA) 10 MG tablet TAKE 1 TABLET BY MOUTH ONCE (1) DAILY Patient taking differently: Take 10 mg by mouth daily.  01/13/19  Yes Georgie ChardMcDaniel, Jill D, NP  finasteride (PROSCAR) 5 MG tablet Take 1 tablet (5 mg total) by mouth daily. 02/02/18  Yes Rai, Ripudeep K, MD  furosemide (LASIX) 20 MG tablet Take 20 mg by mouth daily.  08/13/18  Yes [provider]  isosorbide mononitrate (IMDUR) 60 MG 24 hr tablet Take 1 tablet (60 mg total) by mouth daily. 02/02/18  Yes Rai, Ripudeep K, MD  latanoprost (XALATAN) 0.005 % ophthalmic solution Place 1 drop into both eyes every evening.  08/22/19  Yes [provider]  linaclotide (LINZESS) 290 MCG CAPS capsule Take 290 mcg by mouth daily before breakfast.   Yes [provider]  Melatonin 12 MG TABS Take 12 mg by mouth at bedtime.    Yes [provider]  Multiple Vitamins-Minerals (MULTIVITAMIN WITH MINERALS) tablet Take 1 tablet by mouth daily.   Yes [provider]  nitroGLYCERIN (NITROSTAT) 0.4 MG SL tablet DISSOLVE 1 TABLET UNDER THE TONGUE EVERY 5 MINUTES AS NEEDED FOR CHEST PAIN. (MAXIMUM OF 3 DOSES) 08/08/19  Yes Revankar, Aundra Dubinajan R, MD  ondansetron (ZOFRAN) 8 MG tablet Take 8 mg by mouth 2 (two) times daily as needed for nausea or vomiting.     Yes [provider]  oxybutynin (DITROPAN-XL) 10 MG 24 hr tablet Take 10 mg by mouth daily. 05/04/19  Yes [provider]  pantoprazole (PROTONIX) 40 MG tablet Take 1 tablet (40 mg total) by mouth 2 (two) times daily. 04/11/19 08/21/20 Yes Lanae BoastKc, Ramesh, MD  ranolazine (RANEXA) 1000 MG SR tablet Take 1,000 mg by mouth 2 (two) times daily.  02/25/16  Yes [provider]  traMADol (ULTRAM) 50 MG tablet Take 50 mg by mouth 2 (two) times daily as needed for moderate pain.  04/05/19  Yes [provider]     Allergies:  Allergies  Allergen Reactions  . Codeine Other (See Comments)    unknown  . Lisinopril Cough  . Contrast Media [Iodinated Diagnostic Agents] Hives and Rash    Social History:   Social History   Socioeconomic History  . Marital status: Widowed    Spouse name: Not on file  . Number of children: Not on file  . Years of education: Not on file  . Highest education level: Not on file  Occupational History  . Occupation: Retired  Tobacco Use  . Smoking status: Former Games developer  . Smokeless tobacco: Never Used  Vaping Use  . Vaping Use: Never used  Substance and Sexual Activity  . Alcohol use: Yes    Alcohol/week: 21.0 standard drinks    Types: 21 Cans of beer per week  . Drug use: Yes    Types: Marijuana  . Sexual activity: Not on file  Other Topics Concern  . Not on file  Social History Narrative   He lives in [place], [place] alone.   Social Determinants of Health   Financial Resource Strain:   . Difficulty of Paying Living Expenses:   Food Insecurity:   . Worried About Programme researcher, broadcasting/film/video in the Last Year:   . Barista in the Last Year:   Transportation Needs:   . Freight forwarder (Medical):   Marland Kitchen Lack of Transportation (Non-Medical):   Physical Activity:   . Days of Exercise per Week:   . Minutes of Exercise per Session:   Stress:   . Feeling of Stress :   Social Connections:   . Frequency of Communication  with Friends and Family:   . Frequency of Social Gatherings with Friends and Family:   . Attends Religious Services:   . Active Member of Clubs or Organizations:   . Attends Banker Meetings:   Marland Kitchen Marital Status:   Intimate Partner Violence:   . Fear of Current or Ex-Partner:   . Emotionally Abused:   Marland Kitchen Physically Abused:   . Sexually Abused:     Family History:   The patient's family history includes Diabetes in his mother; Heart attack in his brother.    ROS:  Please see the history of present illness.  All other ROS reviewed and negative.     Physical Exam/Data:   Vitals:   08/22/19 1047 08/22/19 1100 08/22/19 1106 08/22/19 1111  BP: (!) 167/94 (!) 187/118 (!) 183/93 (!) 165/75  Pulse: 66 78 76 67  Resp: 15 20 (!) 33 (!) 22  SpO2: 99% 100% 100% 97%  Weight:      Height:       No intake or output data in the 24 hours ending 08/22/19 1207 Last 3 Weights 08/22/2019 05/25/2019 04/11/2019  Weight (lbs) 176 lb 177 lb 166 lb 8 oz  Weight (kg) 79.833 kg 80.287 kg 75.524 kg     Body mass index is 25.25 kg/m.  General:  Well nourished, well developed, in no acute distress HEENT: normal Lymph: no adenopathy Neck: no JVD Endocrine:  No thryomegaly Vascular: No carotid bruits; FA pulses 2+ bilaterally without bruits  Cardiac:  normal S1, S2; RRR; + murmur  Lungs:  clear to auscultation bilaterally, no wheezing, rhonchi or rales  Abd: soft, nontender, no hepatomegaly  Ext: 2+ B/L edema Musculoskeletal:  No deformities, BUE and BLE strength normal and equal Skin: warm and dry  Neuro:  CNs 2-12 intact, no focal abnormalities noted Psych:  Normal affect  EKG:  The ECG that was done 08/22/19 was personally reviewed and demonstrates NSR, 60 bpm, first degree AV block, non specific T wave changes. EKG during CP episode with NSR, 71 bpm, ST depression anterolateral leads  Relevant CV Studies:  Cardiac Cath 05/24/18  Ost LM to Mid LM lesion is 99% stenosed.  Ost Cx to  Prox Cx lesion is 100% stenosed.  Ost LAD to Prox LAD lesion is 70% stenosed.  Ost Ramus lesion is 99% stenosed.  Ost RPDA lesion is 100% stenosed.  Previously placed Prox RCA to Mid RCA stent (unknown type) is widely patent.  Previously placed Post Atrio stent (unknown type) is widely patent.  Ost RCA to Prox RCA lesion is 30% stenosed.  Previously placed Prox Graft to Mid Graft stent (unknown type) is widely patent.  Dist Graft to Insertion lesion is 30% stenosed.  Origin to Prox Graft lesion is 100% stenosed.  Origin to Prox Graft lesion is 100% stenosed.   IMPRESSION: Austin Malone's anatomy is unchanged compared to post native RCA intervention in January.  His stents to his native RCA and PLA branches were widely patent.  His vein to his occluded PDA was patent as was his LIMA to his LAD.  It certainly possible that his pain is coming from his occluded circumflex and ramus branch.  There are no "culprit lesions".  Medical therapy will be recommended.  A femoral angiogram was performed and the sheath entered at the "crux" of the common femoral, SFA and profunda femoris at the takeoff of a small branch and therefore a Mynx closure device was not deployed.  Manual compression will be done.  The patient left lab in stable condition.  Coronary Diagrams  Diagnostic Dominance: Right     Limited Echo 02/21/18 1. The left ventricle has normal systolic function of 60-65%. The cavity  size was normal. There is no increased left ventricular wall thickness.  2. The right ventricle has normal systolic function. The cavity was  normal . There is no increase in right ventricular wall thickness.  3. Trivial pericardial effusion.  4. The pericardial effusion is anterior to the right ventricle.  5. Trivial pericardial effusion. No septal shudder, normal size IVC with  normal collapse, no definite mitral or tricuspid inflow variation. No  findings to suggest constrictive physiology.    Laboratory Data:  High Sensitivity Troponin:   Recent Labs  Lab 08/22/19 0942  TROPONINIHS 8      Chemistry Recent Labs  Lab 08/22/19 0942  NA 135  K 3.4*  CL 101  CO2 24  GLUCOSE 97  BUN 10  CREATININE 0.98  CALCIUM 8.7*  GFRNONAA >60  GFRAA >60  ANIONGAP 10    Recent Labs  Lab 08/22/19 0942  PROT 5.8*  ALBUMIN 3.2*  AST 17  ALT 15  ALKPHOS 36*  BILITOT 0.5   Hematology Recent Labs  Lab 08/22/19 0942  WBC 6.6  RBC 2.97*  HGB 10.2*  HCT 30.1*  MCV 101.3*  MCH 34.3*  MCHC 33.9  RDW 13.7  PLT 172   BNP Recent Labs  Lab 08/22/19 0945  BNP 471.0*    DDimer No results for input(s): DDIMER in the last 168 hours.   Radiology/Studies:  DG Chest Port 1 View  Result Date: 08/22/2019 CLINICAL DATA:  Chest pain and shortness of breath 1 week. EXAM: PORTABLE CHEST 1 VIEW COMPARISON:  04/08/2019 and 08/20/2017 FINDINGS: Sternotomy wires unchanged. Lungs are adequately inflated without focal airspace consolidation or effusion. Calcified  granuloma over the right mid to lower lung unchanged. Cardiomediastinal silhouette and remainder of the exam is unchanged. IMPRESSION: No active disease. Electronically Signed   By: Elberta Fortis M.D.   On: 08/22/2019 09:58   { HEAR Score (for undifferentiated chest pain):  HEAR Score: 7     Assessment and Plan:   Unstable angina/ CAD s/p CABG with subsequent stenting Presents with chest pain worse at night with associated sob, relieved with nitro. HS troponin 8. BNP 471. Initial EKG non acute. Repeat EKG during episode of chest pain showed ST depression anterolateral leads. Pain relieved with SL nitro x 2. - Last cath 05/2018 was relatively unchanged from prior cath. Patent prox to RCA stent open, post atrio widely patent, prox to graft stent widely patent.  - PTA Aspirin 81 mg daily, coreg 6.25mg  BID, plavix 75 mg daily, zetia, Imdur 60 mg daily, Ranexa 1000 mg daily - still having episodes of chest pain>> IV nitro  started - stat IV heparin - check echo - Given EKG changes and recurrent chest pain would consider admission for cath. Pt is NPO. Also needs some diuresis. Will discuss plan with MD  Mild Acute on chronic diastolic CHF - pta lasix 20 mg daily and wears compression socks - reported increased LLE swelling for the last week, however weights have not gone up - IV lasix 20 mg BID  - check echo - Strict I/Os - Daily weights - monitor creatinine with diuresis  HTN - Imdur 60 mg daily, Coreg 6.25mg  BID, lasix 20 mg daily - Pressures initially elevated, but since improved, 140/67 - IV nitro started  HLD - pta zetia 10 mg daily - h/o of statin intolerance - LDL 97, TG 96, HDL 60, total 176 01/2018 - will recheck labs  Chronic Foley catheter and chronic UTIs - monitor  Severity of Illness: The appropriate patient status for this patient is INPATIENT. Inpatient status is judged to be reasonable and necessary in order to provide the required intensity of service to ensure the patient's safety. The patient's presenting symptoms, physical exam findings, and initial radiographic and laboratory data in the context of their chronic comorbidities is felt to place them at high risk for further clinical deterioration. Furthermore, it is not anticipated that the patient will be medically stable for discharge from the hospital within 2 midnights of admission. The following factors support the patient status of inpatient.   " The patient's presenting symptoms include chest pain. " The worrisome physical exam findings include chest pain. " The initial radiographic and laboratory data are worrisome because of ST depression anterolateral leads. " The chronic co-morbidities include HTN and HLD.   * I certify that at the point of admission it is my clinical judgment that the patient will require inpatient hospital care spanning beyond 2 midnights from the point of admission due to high intensity of service,  high risk for further deterioration and high frequency of surveillance required.*    For questions or updates, please contact [facility] Please consult [email or web address] for contact info under     Signed, Ellenore Roscoe David Stall, PA-C  08/22/2019 12:07 PM

## 2019-08-22 NOTE — ED Notes (Signed)
Pt reports 4/10 CP to cardiology, verbal orders for 0.4mg  SL single dose given now, see [facility]

## 2019-08-22 NOTE — ED Notes (Signed)
Cath lab 9

## 2019-08-22 NOTE — ED Notes (Addendum)
Pt reports recurrence of CP to tech staff, 10/10, Dr. Judd Lien notified, EKG repeated, new orders placed to repeat nitro and provide morphine, see MAR. No relief with 1st nitro, complete relief with 2nd admin nitro. 0/10 pain at this time. Noted 89% RA, 2L O2 provided, now 95%.

## 2019-08-23 ENCOUNTER — Telehealth: Payer: Self-pay | Admitting: Cardiology

## 2019-08-23 ENCOUNTER — Encounter (HOSPITAL_COMMUNITY): Payer: Self-pay | Admitting: Internal Medicine

## 2019-08-23 ENCOUNTER — Inpatient Hospital Stay (HOSPITAL_COMMUNITY): Payer: Medicare HMO

## 2019-08-23 DIAGNOSIS — I351 Nonrheumatic aortic (valve) insufficiency: Secondary | ICD-10-CM

## 2019-08-23 DIAGNOSIS — I340 Nonrheumatic mitral (valve) insufficiency: Secondary | ICD-10-CM

## 2019-08-23 LAB — LIPID PANEL
Cholesterol: 172 mg/dL (ref 0–200)
HDL: 76 mg/dL (ref >40)
LDL Cholesterol: 86 mg/dL (ref 0–99)
Total CHOL/HDL Ratio: 2.3 RATIO
Triglycerides: 52 mg/dL (ref <150)
VLDL: 10 mg/dL (ref 0–40)

## 2019-08-23 LAB — BASIC METABOLIC PANEL
Anion gap: 9 (ref 5–15)
BUN: 15 mg/dL (ref 8–23)
CO2: 24 mmol/L (ref 22–32)
Calcium: 8.8 mg/dL — ABNORMAL LOW (ref 8.9–10.3)
Chloride: 100 mmol/L (ref 98–111)
Creatinine, Ser: 0.95 mg/dL (ref 0.61–1.24)
GFR calc Af Amer: >60 mL/min (ref >60)
GFR calc non Af Amer: >60 mL/min (ref >60)
Glucose, Bld: 203 mg/dL — ABNORMAL HIGH (ref 70–99)
Potassium: 3.5 mmol/L (ref 3.5–5.1)
Sodium: 133 mmol/L — ABNORMAL LOW (ref 135–145)

## 2019-08-23 LAB — ECHOCARDIOGRAM COMPLETE
Area-P 1/2: 2.37 cm2
Height: 70 in
S' Lateral: 5 cm
Weight: 2712 oz

## 2019-08-23 MED ORDER — MORPHINE SULFATE (PF) 2 MG/ML IV SOLN
2.0000 mg | INTRAVENOUS | Status: DC | PRN
  Administered 2019-08-23: 2 mg via INTRAVENOUS
  Filled 2019-08-23: qty 1

## 2019-08-23 MED ORDER — ISOSORBIDE MONONITRATE ER 60 MG PO TB24
60.0000 mg | ORAL_TABLET | Freq: Every day | ORAL | Status: DC
  Administered 2019-08-23: 60 mg via ORAL
  Filled 2019-08-23: qty 1

## 2019-08-23 MED ORDER — FUROSEMIDE 20 MG PO TABS
20.0000 mg | ORAL_TABLET | Freq: Every day | ORAL | Status: DC
  Administered 2019-08-23: 20 mg via ORAL
  Filled 2019-08-23: qty 1

## 2019-08-23 MED ORDER — DORZOLAMIDE HCL-TIMOLOL MAL 2-0.5 % OP SOLN
1.0000 [drp] | Freq: Two times a day (BID) | OPHTHALMIC | Status: DC
  Administered 2019-08-23 – 2019-08-25 (×5): 1 [drp] via OPHTHALMIC
  Filled 2019-08-23: qty 10

## 2019-08-23 MED ORDER — POTASSIUM CHLORIDE CRYS ER 20 MEQ PO TBCR
40.0000 meq | EXTENDED_RELEASE_TABLET | Freq: Once | ORAL | Status: AC
  Administered 2019-08-23: 40 meq via ORAL
  Filled 2019-08-23: qty 2

## 2019-08-23 MED FILL — Lidocaine HCl Local Preservative Free (PF) Inj 1%: INTRAMUSCULAR | Qty: 30 | Status: AC

## 2019-08-23 NOTE — Progress Notes (Addendum)
Patient stating having 5/10 chest pain, gave 1 nitro  with chest pain relief.   2259 update: patient having 10/10 chest pain, gave 3rd sublingual nitro pain at 5/10. Obtained EKG, paged cardiology. Got orders for iv morphine. Will continue to monitor patient.

## 2019-08-23 NOTE — Telephone Encounter (Signed)
    Pt is schedule for TOC with Dr. Tomie China 09/06/2019. Scheduled by Judy Pimple

## 2019-08-23 NOTE — Telephone Encounter (Signed)
Pt is currently admitted. Will attempt TOC tomorrow.

## 2019-08-23 NOTE — Progress Notes (Signed)
  Echocardiogram 2D Echocardiogram has been performed.  Pieter Partridge 08/23/2019, 11:12 AM

## 2019-08-23 NOTE — Progress Notes (Signed)
Paged by nurse re: recurrent 6/10 chest pain. EKG performed which showed NSR with nonspecific ST changes similar to prior (improved from admission). VSS. 1 SL NTG was given with relief of pain. Reviewed with Dr. [place] -> continue present regimen and continue to observe.  Kelsie Zaborowski PA-C

## 2019-08-23 NOTE — Progress Notes (Addendum)
Pt c/o Chest pain 7/10. BP 171/85, HR 74. Cardiology paged. 1nitro SL given.   1915: Pt states pain is gone.

## 2019-08-23 NOTE — Progress Notes (Signed)
Progress Note  Patient Name: Austin Malone Date of Encounter: 08/23/2019  Primary Cardiologist: Garwin Brothers, MD  Subjective   Patient reports he was lying in bed and felt recurrent 3/10 CP so then sat up at edge of bed and felt better. This felt stereotypical of his usual angina. He did indicate to nurse he felt he moved too quickly.   Inpatient Medications    Scheduled Meds: . alfuzosin  10 mg Oral Daily  . aspirin  324 mg Oral NOW   Or  . aspirin  300 mg Rectal NOW  . aspirin EC  81 mg Oral Daily  . bethanechol  50 mg Oral TID  . carvedilol  6.25 mg Oral BID  . Chlorhexidine Gluconate Cloth  6 each Topical Daily  . clopidogrel  75 mg Oral Daily  . docusate sodium  100 mg Oral Daily  . enoxaparin (LOVENOX) injection  40 mg Subcutaneous Q24H  . ezetimibe  10 mg Oral Daily  . finasteride  5 mg Oral Daily  . furosemide  20 mg Intravenous BID  . linaclotide  290 mcg Oral QAC breakfast  . melatonin  12 mg Oral QHS  . oxybutynin  10 mg Oral Daily  . pantoprazole  40 mg Oral BID  . ranolazine  1,000 mg Oral BID  . sodium chloride flush  3 mL Intravenous Q12H  . sodium chloride flush  3 mL Intravenous Q12H  . sodium chloride flush  3 mL Intravenous Q12H   Continuous Infusions: . sodium chloride    . sodium chloride    . sodium chloride    . nitroGLYCERIN 10 mcg/min (08/23/19 0647)   PRN Meds: sodium chloride, sodium chloride, sodium chloride, acetaminophen, ALPRAZolam, nitroGLYCERIN, ondansetron (ZOFRAN) IV, ondansetron, sodium chloride flush, sodium chloride flush, sodium chloride flush, traMADol, zolpidem   Vital Signs    Vitals:   08/22/19 2010 08/23/19 0003 08/23/19 0426 08/23/19 0444  BP: 137/61 132/62 (!) 143/68   Pulse: 73 73 68   Resp: 18 18 20    Temp: 98 F (36.7 C) 98.1 F (36.7 C) 98.8 F (37.1 C)   TempSrc: Oral Oral Oral   SpO2: 96% 97% 99%   Weight:    76.9 kg  Height:        Intake/Output Summary (Last 24 hours) at 08/23/2019  0814 Last data filed at 08/23/2019 0439 Gross per 24 hour  Intake --  Output 1700 ml  Net -1700 ml   Last 3 Weights 08/23/2019 08/22/2019 05/25/2019  Weight (lbs) 169 lb 8 oz 176 lb 177 lb  Weight (kg) 76.885 kg 79.833 kg 80.287 kg     Telemetry    NSR occasional PVCs - Personally Reviewed  ECG    SB 59bpm one PVC, NSST changes without acute change from prior - Personally Reviewed  Physical Exam   GEN: No acute distress.  HEENT: Normocephalic, atraumatic, sclera non-icteric. Neck: No JVD or bruits. Cardiac: RRR no murmurs, rubs, or gallops.  Radials/DP/PT 1+ and equal bilaterally.  Respiratory: Clear to auscultation bilaterally. Breathing is unlabored. GI: Soft, nontender, non-distended, BS +x 4. MS: no deformity. Extremities: No clubbing or cyanosis. 1+ BLE edema. Distal pedal pulses are 2+ and equal bilaterally. Neuro:  AAOx3. Follows commands. Psych:  Responds to questions appropriately with a normal affect.  Labs    High Sensitivity Troponin:   Recent Labs  Lab 08/22/19 0942 08/22/19 1142  TROPONINIHS 8 10      Cardiac EnzymesNo results for input(s): TROPONINI  in the last 168 hours. No results for input(s): TROPIPOC in the last 168 hours.   Chemistry Recent Labs  Lab 08/22/19 0942 08/23/19 0420  NA 135 133*  K 3.4* 3.5  CL 101 100  CO2 24 24  GLUCOSE 97 203*  BUN 10 15  CREATININE 0.98 0.95  CALCIUM 8.7* 8.8*  PROT 5.8*  --   ALBUMIN 3.2*  --   AST 17  --   ALT 15  --   ALKPHOS 36*  --   BILITOT 0.5  --   GFRNONAA >60 >60  GFRAA >60 >60  ANIONGAP 10 9     Hematology Recent Labs  Lab 08/22/19 0942  WBC 6.6  RBC 2.97*  HGB 10.2*  HCT 30.1*  MCV 101.3*  MCH 34.3*  MCHC 33.9  RDW 13.7  PLT 172    BNP Recent Labs  Lab 08/22/19 0945  BNP 471.0*     DDimer No results for input(s): DDIMER in the last 168 hours.   Radiology    CARDIAC CATHETERIZATION  Result Date: 08/22/2019 Conclusions: 1. Severe native coronary artery disease,  including 99% distal LMCA stenosis with heavy calcification, chronic total occlusion of the ostial LCx as well as 99% stenosis of the ostial ramus.  LAD has 70% diffuse disease.  RCA demonstrates mild ostial/proximal disease as well as chronic total occlusion of the ostial RPDA. 2. Patent mid RCA stent with up to 30% focal in-stent restenosis.  Stent in RCA continuation is widely patent. 3. Widely patent LIMA to LAD. 4. Patent SVG to RPDA.  There is 90% stenosis in the dominant branch of the RPDA just distal to the anastomosis. 5. SVG to diagonal and SVG to OM are chronically occluded. 6. Mildly reduced left ventricular systolic function with hypokinesis of the basal anterior and basal inferior walls.  Normal left ventricular filling pressure. 7. Successful PCI to 90% stenosis of the RPDA distal to SVG anastomosis using Synergy 2.25 x 16 mm drug-eluting stent.  Procedure was complicated by plaque shift into the superior branch of the RPDA requiring balloon angioplasty as well as wire dissection of the proximal SVG to RPDA graft.  Dissection of the graft was treated using Synergy 3.0 x 24 mm drug-eluting stent that overlaps previously placed stents.  Final angiogram shows 30% stenosis at the ostium of the superior branch of the RPDA but otherwise no significant residual stenosis.  There is TIMI-3 flow throughout the vein graft and RPDA. Recommendations: 1. Continue indefinite dual antiplatelet therapy with aspirin and clopidogrel. 2. Aggressive antianginal therapy and escalation of antihypertensive regimen.  Nitroglycerin infusion should be weaned off, as tolerated. 3. If Austin Malone has refractory angina despite today's intervention and aggressive medical therapy, PCI to the LMCA and ostial/proximal LAD may need to be considered to improve perfusion of diagonal and septal branches.  This would require atherectomy given heavy calcification. Yvonne Kendall, MD [facility]   DG Chest Port 1 View  Result Date:  08/22/2019 CLINICAL DATA:  Chest pain and shortness of breath 1 week. EXAM: PORTABLE CHEST 1 VIEW COMPARISON:  04/08/2019 and 08/20/2017 FINDINGS: Sternotomy wires unchanged. Lungs are adequately inflated without focal airspace consolidation or effusion. Calcified granuloma over the right mid to lower lung unchanged. Cardiomediastinal silhouette and remainder of the exam is unchanged. IMPRESSION: No active disease. Electronically Signed   By: Elberta Fortis M.D.   On: 08/22/2019 09:58    Cardiac Studies   LHC 08/22/19 Conclusions: 1. Severe native coronary artery disease, including  99% distal LMCA stenosis with heavy calcification, chronic total occlusion of the ostial LCx as well as 99% stenosis of the ostial ramus.  LAD has 70% diffuse disease.  RCA demonstrates mild ostial/proximal disease as well as chronic total occlusion of the ostial RPDA. 2. Patent mid RCA stent with up to 30% focal in-stent restenosis.  Stent in RCA continuation is widely patent. 3. Widely patent LIMA to LAD. 4. Patent SVG to RPDA.  There is 90% stenosis in the dominant branch of the RPDA just distal to the anastomosis. 5. SVG to diagonal and SVG to OM are chronically occluded. 6. Mildly reduced left ventricular systolic function with hypokinesis of the basal anterior and basal inferior walls.  Normal left ventricular filling pressure. 7. Successful PCI to 90% stenosis of the RPDA distal to SVG anastomosis using Synergy 2.25 x 16 mm drug-eluting stent.  Procedure was complicated by plaque shift into the superior branch of the RPDA requiring balloon angioplasty as well as wire dissection of the proximal SVG to RPDA graft.  Dissection of the graft was treated using Synergy 3.0 x 24 mm drug-eluting stent that overlaps previously placed stents.  Final angiogram shows 30% stenosis at the ostium of the superior branch of the RPDA but otherwise no significant residual stenosis.  There is TIMI-3 flow throughout the vein graft and  RPDA.  Recommendations: 1. Continue indefinite dual antiplatelet therapy with aspirin and clopidogrel. 2. Aggressive antianginal therapy and escalation of antihypertensive regimen.  Nitroglycerin infusion should be weaned off, as tolerated. 3. If Austin Malone has refractory angina despite today's intervention and aggressive medical therapy, PCI to the LMCA and ostial/proximal LAD may need to be considered to improve perfusion of diagonal and septal branches.  This would require atherectomy given heavy calcification.  Yvonne Kendall, MD [facility]   Patient Profile     79 y.o. male with CAD s/p CABG, prior PCIs, recent PCI 02/01/18, pericarditis, HLD, HTN, orthostatic hypotension, PUD, chronic foley catheter, chronic appearing anemia, PAF (dx 01/2018), CKD stage III, prior GIB, chronic diastolic CHF. He was admitted yesterday with chest pain and engative troponins. EKG showed NSR with nonspecific TW changes, ST depression in anterolateral leads. Underwent cath 08/22/19 with findings above, s/p PCI to RPDA distal to SVG anastomosis, complicated by plaque shift into the superior branch of the RPDA requiring balloon angioplasty as well as wire dissection of the proximal SVG to RPDA graft.  Dissection of the graft was treated using Synergy 3.0 x 24 mm drug-eluting stent that overlaps previously placed stents. Per report, if Austin Malone has refractory angina despite today's intervention and aggressive medical therapy, PCI to the LMCA and ostial/proximal LAD may need to be considered to improve perfusion of diagonal and septal branches.  This would require atherectomy given heavy calcification.  Assessment & Plan    1. CAD with unstable angina s/p PCI as above - still on low dose IV NTG 23mcg/min, with brief recurrent CP this AM - continue DAPT, carvedilol, Ranexa - 2D echo pending - will review plan with MD  2. Essential HTN, also with history of orthostatic hypotension per chart - SBPs trend  130s-150s - continue carvedilol and trend - anticipate resumption of diuretic today  3. Hyperlipidemia - profile shows trig 52, HDL 76, LDL 86 - previously reported as statin intolerant, on Zetia - consider referral to lipid clinic in follow-up for PCSK9 inhibitors  4. Hyperglycemia - likely related to steroid pre-med, also affecting sodium level (corrects to 134.6) - A1C 5.4  5. Lower extremity edema - also with hx of chronic diastolic CHF, may be due in part to hypoalbuminemia as well - originally written to get IV Lasix 20mg  BID yesterday but never received; anticipate resuming home Lasix today but will review regimen with MD - compression stockings advised at home - filling pressures normal  6. Hypokalemia  - K 3.5 -> repletion today - may need OP repletion  7. Chronic appearing anemia - continue OP f/u  8. Hx of atrial fib in 01/2018 - Eliquis stopped 02/2018 due to GIB, remains off, no clinical recurrence at this time (NSR)   For questions or updates, please contact [facility] Please consult [email or web address] for contact info under Cardiology/STEMI.  Signed, Laurann Montanaayna N Aarron Wierzbicki, PA-C 08/23/2019, 8:14 AM

## 2019-08-23 NOTE — Progress Notes (Signed)
Pt c/o chest pain 7/10. BP 163/99, HR 73.  1 nitro SL given. EKG performed. Annabelle Harman, NP made aware.  1640: Pt states pain is gone. BP 127/62, HR 69. Only 1 nitro SL given at this time.

## 2019-08-23 NOTE — Progress Notes (Addendum)
Pt c/o chest pain 2/10 when he sat up in bed. BP 158/74. HR 62. Dunn, PA paged. EKG being performed.   4665: Pt states pain is gone. States he thinks he moved to quickly. Dunn, PA aware and stated she would come see pt shortly.

## 2019-08-23 NOTE — Progress Notes (Signed)
CARDIAC REHAB PHASE I   PRE:  Rate/Rhythm: 68 SR    BP: sitting 164/70    SaO2: 99 RA  MODE:  Ambulation: 370 ft   POST:  Rate/Rhythm: 68 SR    BP: sitting 174/78     SaO2: 99 RA  Pt ambulated slow and steady with RW. He uses a RW or cane at home. Slight dizziness but denied CP. Tired after walk. Discussed Plavix, stents, diet, exercise as tolerated and CRPII and NTG. Voiced reception but sts its difficult for him to remember everything since he had Covid in January. Asked me to call his friend Nettie Elm, which I will later. Will refer to [facility] although he is not sure his schedule will coincide. [id number]   Harriet Masson CES, ACSM 08/23/2019 10:34 AM

## 2019-08-24 ENCOUNTER — Encounter (HOSPITAL_COMMUNITY)
Admission: EM | Disposition: A | Discharge status: Home / Self Care | Payer: Self-pay | Source: Home / Self Care | Attending: Cardiology

## 2019-08-24 DIAGNOSIS — I25110 Atherosclerotic heart disease of native coronary artery with unstable angina pectoris: Secondary | ICD-10-CM

## 2019-08-24 HISTORY — PX: CORONARY STENT INTERVENTION: CATH118234

## 2019-08-24 HISTORY — PX: INTRAVASCULAR ULTRASOUND/IVUS: CATH118244

## 2019-08-24 HISTORY — PX: CORONARY ATHERECTOMY: CATH118238

## 2019-08-24 HISTORY — PX: INTRAVASCULAR LITHOTRIPSY: CATH118324

## 2019-08-24 LAB — BASIC METABOLIC PANEL
Anion gap: 9 (ref 5–15)
BUN: 16 mg/dL (ref 8–23)
CO2: 24 mmol/L (ref 22–32)
Calcium: 8.8 mg/dL — ABNORMAL LOW (ref 8.9–10.3)
Chloride: 101 mmol/L (ref 98–111)
Creatinine, Ser: 1 mg/dL (ref 0.61–1.24)
GFR calc Af Amer: >60 mL/min (ref >60)
GFR calc non Af Amer: >60 mL/min (ref >60)
Glucose, Bld: 113 mg/dL — ABNORMAL HIGH (ref 70–99)
Potassium: 3.6 mmol/L (ref 3.5–5.1)
Sodium: 134 mmol/L — ABNORMAL LOW (ref 135–145)

## 2019-08-24 LAB — POCT ACTIVATED CLOTTING TIME
Activated Clotting Time: 257 seconds
Activated Clotting Time: 296 seconds
Activated Clotting Time: 301 seconds
Activated Clotting Time: 400 seconds

## 2019-08-24 LAB — CBC
HCT: 29.4 % — ABNORMAL LOW (ref 39.0–52.0)
Hemoglobin: 10.1 g/dL — ABNORMAL LOW (ref 13.0–17.0)
MCH: 33.8 pg (ref 26.0–34.0)
MCHC: 34.4 g/dL (ref 30.0–36.0)
MCV: 98.3 fL (ref 80.0–100.0)
Platelets: 180 10*3/uL (ref 150–400)
RBC: 2.99 MIL/uL — ABNORMAL LOW (ref 4.22–5.81)
RDW: 13.8 % (ref 11.5–15.5)
WBC: 8.7 10*3/uL (ref 4.0–10.5)
nRBC: 0 % (ref 0.0–0.2)

## 2019-08-24 SURGERY — CORONARY ATHERECTOMY
Anesthesia: LOCAL

## 2019-08-24 MED ORDER — NITROGLYCERIN 1 MG/10 ML FOR IR/CATH LAB
INTRA_ARTERIAL | Status: AC
  Filled 2019-08-24: qty 10

## 2019-08-24 MED ORDER — SODIUM CHLORIDE 0.9 % IV SOLN: 250.0000 mL | INTRAVENOUS | Status: DC | PRN

## 2019-08-24 MED ORDER — CLOPIDOGREL BISULFATE 75 MG PO TABS: 75.0000 mg | ORAL_TABLET | Freq: Every day | ORAL | Status: DC

## 2019-08-24 MED ORDER — FUROSEMIDE 20 MG PO TABS
20.0000 mg | ORAL_TABLET | Freq: Every day | ORAL | Status: DC
  Administered 2019-08-25: 20 mg via ORAL
  Filled 2019-08-24: qty 1

## 2019-08-24 MED ORDER — AMLODIPINE BESYLATE 2.5 MG PO TABS
2.5000 mg | ORAL_TABLET | Freq: Every day | ORAL | Status: DC
  Administered 2019-08-24: 2.5 mg via ORAL
  Filled 2019-08-24: qty 1

## 2019-08-24 MED ORDER — MIDAZOLAM HCL 2 MG/2ML IJ SOLN
INTRAMUSCULAR | Status: AC
  Filled 2019-08-24: qty 2

## 2019-08-24 MED ORDER — FENTANYL CITRATE (PF) 100 MCG/2ML IJ SOLN
INTRAMUSCULAR | Status: AC
  Filled 2019-08-24: qty 2

## 2019-08-24 MED ORDER — HEPARIN SODIUM (PORCINE) 1000 UNIT/ML IJ SOLN
INTRAMUSCULAR | Status: AC
  Filled 2019-08-24: qty 1

## 2019-08-24 MED ORDER — MIDAZOLAM HCL 2 MG/2ML IJ SOLN
INTRAMUSCULAR | Status: DC | PRN
  Administered 2019-08-24 (×5): 1 mg via INTRAVENOUS

## 2019-08-24 MED ORDER — VERAPAMIL HCL 2.5 MG/ML IV SOLN
INTRAVENOUS | Status: AC
  Filled 2019-08-24: qty 2

## 2019-08-24 MED ORDER — FENTANYL CITRATE (PF) 100 MCG/2ML IJ SOLN
INTRAMUSCULAR | Status: DC | PRN
  Administered 2019-08-24 (×5): 25 ug via INTRAVENOUS

## 2019-08-24 MED ORDER — HEPARIN (PORCINE) IN NACL 1000-0.9 UT/500ML-% IV SOLN
INTRAVENOUS | Status: DC | PRN
  Administered 2019-08-24 (×2): 500 mL

## 2019-08-24 MED ORDER — METHYLPREDNISOLONE SODIUM SUCC 125 MG IJ SOLR
125.0000 mg | Freq: Once | INTRAMUSCULAR | Status: AC
  Administered 2019-08-24: 125 mg via INTRAVENOUS
  Filled 2019-08-24: qty 2

## 2019-08-24 MED ORDER — ASPIRIN 81 MG PO CHEW: 81.0000 mg | CHEWABLE_TABLET | Freq: Every day | ORAL | Status: DC

## 2019-08-24 MED ORDER — IOHEXOL 350 MG/ML SOLN
INTRAVENOUS | Status: AC
  Filled 2019-08-24: qty 1

## 2019-08-24 MED ORDER — NITROGLYCERIN IN D5W 200-5 MCG/ML-% IV SOLN
2.0000 ug/min | INTRAVENOUS | Status: DC
  Administered 2019-08-24: 5 ug/min via INTRAVENOUS
  Filled 2019-08-24: qty 250

## 2019-08-24 MED ORDER — SODIUM CHLORIDE 0.9% FLUSH
3.0000 mL | Freq: Two times a day (BID) | INTRAVENOUS | Status: DC
  Administered 2019-08-24: 3 mL via INTRAVENOUS

## 2019-08-24 MED ORDER — ACETAMINOPHEN 325 MG PO TABS: 650.0000 mg | ORAL_TABLET | ORAL | Status: DC | PRN

## 2019-08-24 MED ORDER — HYDRALAZINE HCL 20 MG/ML IJ SOLN: 10.0000 mg | INTRAMUSCULAR | Status: AC | PRN

## 2019-08-24 MED ORDER — SODIUM CHLORIDE 0.9 % IV SOLN: INTRAVENOUS | Status: AC

## 2019-08-24 MED ORDER — VERAPAMIL HCL 2.5 MG/ML IV SOLN
INTRAVENOUS | Status: DC | PRN
  Administered 2019-08-24: 10 mL via INTRA_ARTERIAL

## 2019-08-24 MED ORDER — DIPHENHYDRAMINE HCL 50 MG/ML IJ SOLN
25.0000 mg | Freq: Once | INTRAMUSCULAR | Status: AC
  Administered 2019-08-24: 25 mg via INTRAVENOUS
  Filled 2019-08-24: qty 1

## 2019-08-24 MED ORDER — SODIUM CHLORIDE 0.9% FLUSH: 3.0000 mL | INTRAVENOUS | Status: DC | PRN

## 2019-08-24 MED ORDER — NITROGLYCERIN 1 MG/10 ML FOR IR/CATH LAB
INTRA_ARTERIAL | Status: DC | PRN
  Administered 2019-08-24: 200 ug via INTRACORONARY

## 2019-08-24 MED ORDER — LIDOCAINE HCL (PF) 1 % IJ SOLN
INTRAMUSCULAR | Status: DC | PRN
  Administered 2019-08-24: 2 mL

## 2019-08-24 MED ORDER — ISOSORBIDE MONONITRATE ER 60 MG PO TB24
90.0000 mg | ORAL_TABLET | Freq: Every day | ORAL | Status: DC
  Administered 2019-08-24 – 2019-08-25 (×2): 90 mg via ORAL
  Filled 2019-08-24 (×2): qty 1

## 2019-08-24 MED ORDER — NITROGLYCERIN 1 MG/10 ML FOR IR/CATH LAB
INTRA_ARTERIAL | Status: DC | PRN
  Administered 2019-08-24 (×3): 200 ug via INTRACORONARY
  Administered 2019-08-24: 300 ug via INTRA_ARTERIAL

## 2019-08-24 MED ORDER — IOHEXOL 350 MG/ML SOLN
INTRAVENOUS | Status: DC | PRN
  Administered 2019-08-24: 110 mL

## 2019-08-24 MED ORDER — LABETALOL HCL 5 MG/ML IV SOLN: 10.0000 mg | INTRAVENOUS | Status: AC | PRN

## 2019-08-24 MED ORDER — HEPARIN (PORCINE) IN NACL 1000-0.9 UT/500ML-% IV SOLN
INTRAVENOUS | Status: AC
  Filled 2019-08-24: qty 1000

## 2019-08-24 MED ORDER — POTASSIUM CHLORIDE CRYS ER 20 MEQ PO TBCR
30.0000 meq | EXTENDED_RELEASE_TABLET | ORAL | Status: AC
  Administered 2019-08-24 (×2): 30 meq via ORAL
  Filled 2019-08-24 (×2): qty 1

## 2019-08-24 MED ORDER — SODIUM CHLORIDE 0.9 % IV SOLN: INTRAVENOUS | Status: DC

## 2019-08-24 MED ORDER — FUROSEMIDE 10 MG/ML IJ SOLN
40.0000 mg | Freq: Once | INTRAMUSCULAR | Status: AC
  Administered 2019-08-24: 40 mg via INTRAVENOUS
  Filled 2019-08-24: qty 4

## 2019-08-24 MED ORDER — ONDANSETRON HCL 4 MG/2ML IJ SOLN: 4.0000 mg | Freq: Four times a day (QID) | INTRAMUSCULAR | Status: DC | PRN

## 2019-08-24 MED ORDER — LIDOCAINE HCL (PF) 1 % IJ SOLN
INTRAMUSCULAR | Status: AC
  Filled 2019-08-24: qty 30

## 2019-08-24 MED ORDER — MORPHINE SULFATE (PF) 2 MG/ML IV SOLN: 2.0000 mg | Freq: Once | INTRAVENOUS | Status: DC

## 2019-08-24 MED ORDER — HEPARIN SODIUM (PORCINE) 1000 UNIT/ML IJ SOLN
INTRAMUSCULAR | Status: DC | PRN
  Administered 2019-08-24: 9000 [IU] via INTRAVENOUS
  Administered 2019-08-24 (×2): 3000 [IU] via INTRAVENOUS

## 2019-08-24 MED ORDER — SODIUM CHLORIDE 0.9% FLUSH: 3.0000 mL | Freq: Two times a day (BID) | INTRAVENOUS | Status: DC

## 2019-08-24 SURGICAL SUPPLY — 30 items
BALLN SAPPHIRE 2.5X15 (BALLOONS) ×3
BALLN SAPPHIRE ~~LOC~~ 3.25X15 (BALLOONS) ×1 IMPLANT
BALLOON SAPPHIRE 2.5X15 (BALLOONS) IMPLANT
CATH INFINITI JR4 5F (CATHETERS) ×1 IMPLANT
CATH LAUNCHER 6FR EBU3.5 (CATHETERS) ×1 IMPLANT
CATH OPTICROSS HD (CATHETERS) ×1 IMPLANT
CATH SHOCKWAVE 3.0X12 (CATHETERS) IMPLANT
CATH TELEPORT (CATHETERS) ×1 IMPLANT
CATHETER SHOCKWAVE 3.0X12 (CATHETERS) ×3
COVER SWIFTLINK CONNECTOR (BAG) ×1 IMPLANT
CROWN DIAMONDBACK CLASSIC 1.25 (BURR) ×1 IMPLANT
DEVICE RAD COMP TR BAND LRG (VASCULAR PRODUCTS) ×1 IMPLANT
ELECT DEFIB PAD ADLT CADENCE (PAD) ×1 IMPLANT
GLIDESHEATH SLENDER 7FR .021G (SHEATH) ×1 IMPLANT
GUIDEWIRE INQWIRE 1.5J.035X260 (WIRE) IMPLANT
INQWIRE 1.5J .035X260CM (WIRE) ×3
KIT ENCORE 26 ADVANTAGE (KITS) ×1 IMPLANT
KIT HEART LEFT (KITS) ×3 IMPLANT
KIT HEMO VALVE WATCHDOG (MISCELLANEOUS) ×1 IMPLANT
LUBRICANT VIPERSLIDE CORONARY (MISCELLANEOUS) ×1 IMPLANT
PACK CARDIAC CATHETERIZATION (CUSTOM PROCEDURE TRAY) ×3 IMPLANT
SLED PULL BACK IVUS (MISCELLANEOUS) ×1 IMPLANT
STENT RESOLUTE ONYX 3.0X18 (Permanent Stent) ×1 IMPLANT
STENT RESOLUTE ONYX 3.5X12 (Permanent Stent) ×1 IMPLANT
TRANSDUCER W/STOPCOCK (MISCELLANEOUS) ×3 IMPLANT
TUBING CIL FLEX 10 FLL-RA (TUBING) ×3 IMPLANT
WIRE ASAHI PROWATER 180CM (WIRE) ×1 IMPLANT
WIRE FIGHTER CROSSING 190CM (WIRE) ×1 IMPLANT
WIRE HI TORQ BMW 190CM (WIRE) ×1 IMPLANT
WIRE VIPERWIRE COR FLEX .012 (WIRE) ×1 IMPLANT

## 2019-08-24 NOTE — Telephone Encounter (Signed)
PT remains admitted. Will continue to try to complete TOC.

## 2019-08-24 NOTE — H&P (View-Only) (Signed)
Progress Note  Patient Name: Austin Malone Date of Encounter: 08/24/2019  [facility] Cardiologist: Garwin Brothers, MD   Subjective   Pt reports 8/10 chest pain when lying flat last night. This is the same chest pain that he has experience for several years. CP is generally relieved with sitting up. Nitro drip started last evening with relief of his chest pain.   Inpatient Medications    Scheduled Meds: . alfuzosin  10 mg Oral Daily  . amLODipine  2.5 mg Oral Daily  . aspirin EC  81 mg Oral Daily  . bethanechol  50 mg Oral TID  . carvedilol  6.25 mg Oral BID  . Chlorhexidine Gluconate Cloth  6 each Topical Daily  . clopidogrel  75 mg Oral Daily  . docusate sodium  100 mg Oral Daily  . dorzolamide-timolol  1 drop Both Eyes BID  . enoxaparin (LOVENOX) injection  40 mg Subcutaneous Q24H  . ezetimibe  10 mg Oral Daily  . finasteride  5 mg Oral Daily  . furosemide  40 mg Intravenous Once  . [START ON 08/25/2019] furosemide  20 mg Oral Daily  . isosorbide mononitrate  90 mg Oral Daily  . linaclotide  290 mcg Oral QAC breakfast  . melatonin  12 mg Oral QHS  .  morphine injection  2 mg Intravenous Once  . oxybutynin  10 mg Oral Daily  . pantoprazole  40 mg Oral BID  . potassium chloride  30 mEq Oral Q3H  . ranolazine  1,000 mg Oral BID  . sodium chloride flush  3 mL Intravenous Q12H  . sodium chloride flush  3 mL Intravenous Q12H  . sodium chloride flush  3 mL Intravenous Q12H   Continuous Infusions: . sodium chloride    . sodium chloride    . sodium chloride    . nitroGLYCERIN 65 mcg/min (08/24/19 0425)   PRN Meds: sodium chloride, sodium chloride, sodium chloride, acetaminophen, ALPRAZolam, nitroGLYCERIN, ondansetron (ZOFRAN) IV, ondansetron, sodium chloride flush, sodium chloride flush, sodium chloride flush, traMADol, zolpidem   Vital Signs    Vitals:   08/23/19 2258 08/24/19 0052 08/24/19 0500 08/24/19 0800  BP: (!) 157/76 (!) 176/95 (!) 147/80 (!) 141/79   Pulse:  (!) 58 60 60  Resp:  16 18 19   Temp:  98.2 F (36.8 C) 97.9 F (36.6 C) 97.7 F (36.5 C)  TempSrc:  Oral Axillary Oral  SpO2:  95% 95% 98%  Weight:      Height:        Intake/Output Summary (Last 24 hours) at 08/24/2019 0835 Last data filed at 08/24/2019 0138 Gross per 24 hour  Intake 1219.43 ml  Output 1700 ml  Net -480.57 ml   Last 3 Weights 08/23/2019 08/22/2019 05/25/2019  Weight (lbs) 169 lb 8 oz 176 lb 177 lb  Weight (kg) 76.885 kg 79.833 kg 80.287 kg      Telemetry    Sinus rhythm 60s - Personally Reviewed  ECG    No new tracings - Personally Reviewed  Physical Exam   GEN: No acute distress.   Neck: No JVD Cardiac: RRR, no murmurs, rubs, or gallops.  Respiratory: Clear to auscultation bilaterally. GI: Soft, nontender, non-distended  MS: 1+ B LE edema Neuro:  Nonfocal  Psych: Normal affect   Labs    High Sensitivity Troponin:   Recent Labs  Lab 08/22/19 0942 08/22/19 1142  TROPONINIHS 8 10      Chemistry Recent Labs  Lab 08/22/19 10/22/19 08/23/19  0420 08/24/19 0509  NA 135 133* 134*  K 3.4* 3.5 3.6  CL 101 100 101  CO2 24 24 24   GLUCOSE 97 203* 113*  BUN 10 15 16   CREATININE 0.98 0.95 1.00  CALCIUM 8.7* 8.8* 8.8*  PROT 5.8*  --   --   ALBUMIN 3.2*  --   --   AST 17  --   --   ALT 15  --   --   ALKPHOS 36*  --   --   BILITOT 0.5  --   --   GFRNONAA >60 >60 >60  GFRAA >60 >60 >60  ANIONGAP 10 9 9      Hematology Recent Labs  Lab 08/22/19 0942 08/24/19 0509  WBC 6.6 8.7  RBC 2.97* 2.99*  HGB 10.2* 10.1*  HCT 30.1* 29.4*  MCV 101.3* 98.3  MCH 34.3* 33.8  MCHC 33.9 34.4  RDW 13.7 13.8  PLT 172 180    BNP Recent Labs  Lab 08/22/19 0945  BNP 471.0*     DDimer No results for input(s): DDIMER in the last 168 hours.   Radiology    CARDIAC CATHETERIZATION  Result Date: 08/22/2019 Conclusions: 1. Severe native coronary artery disease, including 99% distal LMCA stenosis with heavy calcification, chronic total occlusion  of the ostial LCx as well as 99% stenosis of the ostial ramus.  LAD has 70% diffuse disease.  RCA demonstrates mild ostial/proximal disease as well as chronic total occlusion of the ostial RPDA. 2. Patent mid RCA stent with up to 30% focal in-stent restenosis.  Stent in RCA continuation is widely patent. 3. Widely patent LIMA to LAD. 4. Patent SVG to RPDA.  There is 90% stenosis in the dominant branch of the RPDA just distal to the anastomosis. 5. SVG to diagonal and SVG to OM are chronically occluded. 6. Mildly reduced left ventricular systolic function with hypokinesis of the basal anterior and basal inferior walls.  Normal left ventricular filling pressure. 7. Successful PCI to 90% stenosis of the RPDA distal to SVG anastomosis using Synergy 2.25 x 16 mm drug-eluting stent.  Procedure was complicated by plaque shift into the superior branch of the RPDA requiring balloon angioplasty as well as wire dissection of the proximal SVG to RPDA graft.  Dissection of the graft was treated using Synergy 3.0 x 24 mm drug-eluting stent that overlaps previously placed stents.  Final angiogram shows 30% stenosis at the ostium of the superior branch of the RPDA but otherwise no significant residual stenosis.  There is TIMI-3 flow throughout the vein graft and RPDA. Recommendations: 1. Continue indefinite dual antiplatelet therapy with aspirin and clopidogrel. 2. Aggressive antianginal therapy and escalation of antihypertensive regimen.  Nitroglycerin infusion should be weaned off, as tolerated. 3. If Austin Malone has refractory angina despite today's intervention and aggressive medical therapy, PCI to the LMCA and ostial/proximal LAD may need to be considered to improve perfusion of diagonal and septal branches.  This would require atherectomy given heavy calcification. 10/22/19, MD [facility]   DG Chest Port 1 View  Result Date: 08/22/2019 CLINICAL DATA:  Chest pain and shortness of breath 1 week. EXAM: PORTABLE  CHEST 1 VIEW COMPARISON:  04/08/2019 and 08/20/2017 FINDINGS: Sternotomy wires unchanged. Lungs are adequately inflated without focal airspace consolidation or effusion. Calcified granuloma over the right mid to lower lung unchanged. Cardiomediastinal silhouette and remainder of the exam is unchanged. IMPRESSION: No active disease. Electronically Signed   By: 10/22/2019 M.D.   On: 08/22/2019 09:58  ECHOCARDIOGRAM COMPLETE  Result Date: 08/23/2019    ECHOCARDIOGRAM REPORT   Patient Name:   Austin Malone Date of Exam: 08/23/2019 Medical Rec #:  [id number]         Height:       70.0 in Accession #:    [id number]        Weight:       169.5 lb Date of Birth:  12-Aug-1940         BSA:          1.946 m Patient Age:    79 years          BP:           158/74 mmHg Patient Gender: M                 HR:           65 bpm. Exam Location:  Inpatient Procedure: 2D Echo, Cardiac Doppler and Color Doppler Indications:    Chest pain  History:        Patient has prior history of Echocardiogram examinations, most                 recent 02/21/2018. CAD, Prior CABG, Arrythmias:Atrial                 Fibrillation; Risk Factors:Hypertension and Dyslipidemia. LE                 edma.  Sonographer:    Lavenia Atlas Referring Phys: [phone] CHRISTOPHER END IMPRESSIONS  1. Left ventricular ejection fraction, by estimation, is 40 to 45%. The left ventricle has mildly decreased function. The left ventricle demonstrates regional wall motion abnormalities (see scoring diagram/findings for description). The left ventricular  internal cavity size was moderately dilated. There is mild eccentric left ventricular hypertrophy. Left ventricular diastolic parameters are consistent with Grade II diastolic dysfunction (pseudonormalization). There is severe hypokinesis of the left ventricular, entire inferolateral wall.  2. Right ventricular systolic function is normal. The right ventricular size is normal. Tricuspid regurgitation signal is inadequate  for assessing PA pressure.  3. Left atrial size was moderately dilated.  4. The mitral valve is normal in structure. Mild to moderate mitral valve regurgitation.  5. The aortic valve is normal in structure. Aortic valve regurgitation is mild to moderate. Mild aortic valve sclerosis is present, with no evidence of aortic valve stenosis. Comparison(s): Prior images reviewed side by side. The left ventricular function is worsened. The left ventricular wall motion abnormality is new. FINDINGS  Left Ventricle: Left ventricular ejection fraction, by estimation, is 40 to 45%. The left ventricle has mildly decreased function. The left ventricle demonstrates regional wall motion abnormalities. Severe hypokinesis of the left ventricular, entire inferolateral wall. The left ventricular internal cavity size was moderately dilated. There is mild eccentric left ventricular hypertrophy. Left ventricular diastolic parameters are consistent with Grade II diastolic dysfunction (pseudonormalization). Right Ventricle: The right ventricular size is normal. No increase in right ventricular wall thickness. Right ventricular systolic function is normal. Tricuspid regurgitation signal is inadequate for assessing PA pressure. The tricuspid regurgitant velocity is 1.98 m/s, and with an assumed right atrial pressure of 8 mmHg, the estimated right ventricular systolic pressure is 23.7 mmHg. Left Atrium: Left atrial size was moderately dilated. Right Atrium: Right atrial size was normal in size. Pericardium: There is no evidence of pericardial effusion. Mitral Valve: The mitral valve is normal in structure. Mild mitral annular calcification. Mild to moderate mitral valve regurgitation. Tricuspid Valve:  The tricuspid valve is normal in structure. Tricuspid valve regurgitation is trivial. Aortic Valve: The aortic valve is normal in structure. Aortic valve regurgitation is mild to moderate. Mild aortic valve sclerosis is present, with no evidence  of aortic valve stenosis. Pulmonic Valve: The pulmonic valve was normal in structure. Pulmonic valve regurgitation is not visualized. Aorta: The aortic root is normal in size and structure. IAS/Shunts: The interatrial septum was not well visualized.  LEFT VENTRICLE PLAX 2D LVIDd:         6.20 cm  Diastology LVIDs:         5.00 cm  LV e' lateral:   9.90 cm/s LV PW:         1.30 cm  LV E/e' lateral: 7.3 LV IVS:        1.20 cm  LV e' medial:    6.96 cm/s LVOT diam:     2.20 cm  LV E/e' medial:  10.4 LV SV:         60 LV SV Index:   31 LVOT Area:     3.80 cm  RIGHT VENTRICLE RV Basal diam:  3.90 cm RV S prime:     9.46 cm/s LEFT ATRIUM              Index       RIGHT ATRIUM           Index LA diam:        4.40 cm  2.26 cm/m  RA Area:     15.40 cm LA Vol (A2C):   54.2 ml  27.86 ml/m RA Volume:   46.80 ml  24.05 ml/m LA Vol (A4C):   105.0 ml 53.97 ml/m LA Biplane Vol: 75.7 ml  38.91 ml/m  AORTIC VALVE LVOT Vmax:   71.80 cm/s LVOT Vmean:  46.500 cm/s LVOT VTI:    0.157 m  AORTA Ao Root diam: 2.80 cm MITRAL VALVE               TRICUSPID VALVE MV Area (PHT): 2.37 cm    TR Peak grad:   15.7 mmHg MV Decel Time: 320 msec    TR Vmax:        198.00 cm/s MV E velocity: 72.40 cm/s MV A velocity: 27.40 cm/s  SHUNTS MV E/A ratio:  2.64        Systemic VTI:  0.16 m                            Systemic Diam: 2.20 cm Thurmon Fair MD Electronically signed by Thurmon Fair MD Signature Date/Time: 08/23/2019/11:40:03 AM    Final     Cardiac Studies   LHC 08/22/19 Conclusions: 1. Severe native coronary artery disease, including 99% distal LMCA stenosis with heavy calcification, chronic total occlusion of the ostial LCx as well as 99% stenosis of the ostial ramus. LAD has 70% diffuse disease. RCA demonstrates mild ostial/proximal disease as well as chronic total occlusion of the ostial RPDA. 2. Patent mid RCA stent with up to 30% focal in-stent restenosis. Stent in RCA continuation is widely patent. 3. Widely patent LIMA to  LAD. 4. Patent SVG to RPDA. There is 90% stenosis in the dominant branch of the RPDA just distal to the anastomosis. 5. SVG to diagonal and SVG to OM are chronically occluded. 6. Mildly reduced left ventricular systolic function with hypokinesis of the basal anterior and basal inferior walls. Normal left ventricular filling pressure. 7. Successful  PCI to 90% stenosis of the RPDA distal to SVG anastomosis using Synergy 2.25 x 16 mm drug-eluting stent. Procedure was complicated by plaque shift into the superior branch of the RPDA requiring balloon angioplasty as well as wire dissection of the proximal SVG to RPDA graft. Dissection of the graft was treated using Synergy 3.0 x 24 mm drug-eluting stent that overlaps previously placed stents. Final angiogram shows 30% stenosis at the ostium of the superior branch of the RPDA but otherwise no significant residual stenosis. There is TIMI-3 flow throughout the vein graft and RPDA.  Recommendations: 1. Continue indefinite dual antiplatelet therapy with aspirin and clopidogrel. 2. Aggressive antianginal therapy and escalation of antihypertensive regimen. Nitroglycerin infusion should be weaned off, as tolerated. 3. If Mr. Yablonski has refractory angina despite today's intervention and aggressive medical therapy, PCI to the LMCA and ostial/proximal LAD may need to be considered to improve perfusion of diagonal and septal branches. This would require atherectomy given heavy calcification.    Echo 08/23/19: 1. Left ventricular ejection fraction, by estimation, is 40 to 45%. The  left ventricle has mildly decreased function. The left ventricle  demonstrates regional wall motion abnormalities (see scoring  diagram/findings for description). The left ventricular  internal cavity size was moderately dilated. There is mild eccentric left  ventricular hypertrophy. Left ventricular diastolic parameters are  consistent with Grade II diastolic dysfunction  (pseudonormalization).  There is severe hypokinesis of the left  ventricular, entire inferolateral wall.  2. Right ventricular systolic function is normal. The right ventricular  size is normal. Tricuspid regurgitation signal is inadequate for assessing  PA pressure.  3. Left atrial size was moderately dilated.  4. The mitral valve is normal in structure. Mild to moderate mitral valve  regurgitation.  5. The aortic valve is normal in structure. Aortic valve regurgitation is  mild to moderate. Mild aortic valve sclerosis is present, with no evidence  of aortic valve stenosis.   Patient Profile     79 y.o. male  with CAD s/p CABG, prior PCIs, recentPCI1/20/20, pericarditis,HLD, HTN, orthostatic hypotension, PUD,chronic foley catheter,chronic appearing anemia, PAF (dx 01/2018), CKD stage III, prior GIB, chronic diastolic CHF. He was admitted yesterday with chest pain and negative troponins. EKG showed NSR with nonspecific TW changes, ST depression in anterolateral leads. Underwent cath 08/22/19 with findings above, s/p PCI to RPDA distal to SVG anastomosis, complicated by plaque shift into the superior branch of the RPDA requiring balloon angioplasty as well as wire dissection of the proximal SVG to RPDA graft. Dissection of the graft was treated using Synergy 3.0 x 24 mm drug-eluting stent that overlaps previously placed stents. Per report, if Mr. Hertzog has refractory angina despite today's intervention and aggressive medical therapy, PCI to the LMCA and ostial/proximal LAD may need to be considered to improve perfusion of diagonal and septal branches. This would require atherectomy given heavy calcification.  Assessment & Plan    CAD  - s/p PCI as above - patient continued to have CP last night requiring nitro drip - imdur 60 mg was added yesterday  - nitro has been titrated to 65 mcg this morning - already taking ranexa - will increase imdur to 90 mg - add norvasc 2.5 mg -  discontinue nitro this morning - he describes this as the same chest pain he has had for "years" - CP with lying flat, relieved with sitting up - hesitate to proceed with atherectomy until medical therapy has been exhausted - will discuss with Dr. Jordan     Hypertension - continue coreg 6.25 mg BID - will wean off nitro, increase imdur to 90 mg and add 2.5 mg norvasc   Hyperlipidemia 08/23/2019: Cholesterol 172; HDL 76; LDL Cholesterol 86; Triglycerides 52; VLDL 10 Pt reports intolerance to statins and unwilling to try a statin again - will need lipid clinic referral at discharge   Chronic diastolic heart failure - normal filling pressures - does have some lower extremity edema - received 20 mg IV lasix x 1 and 20 mg PO lasix x 1 yesterday - 1.7 L urine output yesterday - will give 40 mg IV lasix x 1 today and hold home PO dose - he only has SOB with lying down/during chest pain   PAF - in Jan 2020 - no recurrence - no AC since GI bleed 02/2018   Anemia - hb 10.1 (10.2)     For questions or updates, please contact [facility] Please consult [email or web address] for contact info under        Signed, Marcelino Dusterngela Nicole Cassandr Cederberg, PA  08/24/2019, 8:35 AM

## 2019-08-24 NOTE — Progress Notes (Signed)
Progress Note  Patient Name: Austin Malone Date of Encounter: 08/24/2019  [facility] Cardiologist: Garwin Brothers, MD   Subjective   Pt reports 8/10 chest pain when lying flat last night. This is the same chest pain that he has experience for several years. CP is generally relieved with sitting up. Nitro drip started last evening with relief of his chest pain.   Inpatient Medications    Scheduled Meds: . alfuzosin  10 mg Oral Daily  . amLODipine  2.5 mg Oral Daily  . aspirin EC  81 mg Oral Daily  . bethanechol  50 mg Oral TID  . carvedilol  6.25 mg Oral BID  . Chlorhexidine Gluconate Cloth  6 each Topical Daily  . clopidogrel  75 mg Oral Daily  . docusate sodium  100 mg Oral Daily  . dorzolamide-timolol  1 drop Both Eyes BID  . enoxaparin (LOVENOX) injection  40 mg Subcutaneous Q24H  . ezetimibe  10 mg Oral Daily  . finasteride  5 mg Oral Daily  . furosemide  40 mg Intravenous Once  . [START ON 08/25/2019] furosemide  20 mg Oral Daily  . isosorbide mononitrate  90 mg Oral Daily  . linaclotide  290 mcg Oral QAC breakfast  . melatonin  12 mg Oral QHS  .  morphine injection  2 mg Intravenous Once  . oxybutynin  10 mg Oral Daily  . pantoprazole  40 mg Oral BID  . potassium chloride  30 mEq Oral Q3H  . ranolazine  1,000 mg Oral BID  . sodium chloride flush  3 mL Intravenous Q12H  . sodium chloride flush  3 mL Intravenous Q12H  . sodium chloride flush  3 mL Intravenous Q12H   Continuous Infusions: . sodium chloride    . sodium chloride    . sodium chloride    . nitroGLYCERIN 65 mcg/min (08/24/19 0425)   PRN Meds: sodium chloride, sodium chloride, sodium chloride, acetaminophen, ALPRAZolam, nitroGLYCERIN, ondansetron (ZOFRAN) IV, ondansetron, sodium chloride flush, sodium chloride flush, sodium chloride flush, traMADol, zolpidem   Vital Signs    Vitals:   08/23/19 2258 08/24/19 0052 08/24/19 0500 08/24/19 0800  BP: (!) 157/76 (!) 176/95 (!) 147/80 (!) 141/79   Pulse:  (!) 58 60 60  Resp:  16 18 19   Temp:  98.2 F (36.8 C) 97.9 F (36.6 C) 97.7 F (36.5 C)  TempSrc:  Oral Axillary Oral  SpO2:  95% 95% 98%  Weight:      Height:        Intake/Output Summary (Last 24 hours) at 08/24/2019 0835 Last data filed at 08/24/2019 0138 Gross per 24 hour  Intake 1219.43 ml  Output 1700 ml  Net -480.57 ml   Last 3 Weights 08/23/2019 08/22/2019 05/25/2019  Weight (lbs) 169 lb 8 oz 176 lb 177 lb  Weight (kg) 76.885 kg 79.833 kg 80.287 kg      Telemetry    Sinus rhythm 60s - Personally Reviewed  ECG    No new tracings - Personally Reviewed  Physical Exam   GEN: No acute distress.   Neck: No JVD Cardiac: RRR, no murmurs, rubs, or gallops.  Respiratory: Clear to auscultation bilaterally. GI: Soft, nontender, non-distended  MS: 1+ B LE edema Neuro:  Nonfocal  Psych: Normal affect   Labs    High Sensitivity Troponin:   Recent Labs  Lab 08/22/19 0942 08/22/19 1142  TROPONINIHS 8 10      Chemistry Recent Labs  Lab 08/22/19 10/22/19 08/23/19  0420 08/24/19 0509  NA 135 133* 134*  K 3.4* 3.5 3.6  CL 101 100 101  CO2 24 24 24   GLUCOSE 97 203* 113*  BUN 10 15 16   CREATININE 0.98 0.95 1.00  CALCIUM 8.7* 8.8* 8.8*  PROT 5.8*  --   --   ALBUMIN 3.2*  --   --   AST 17  --   --   ALT 15  --   --   ALKPHOS 36*  --   --   BILITOT 0.5  --   --   GFRNONAA >60 >60 >60  GFRAA >60 >60 >60  ANIONGAP 10 9 9      Hematology Recent Labs  Lab 08/22/19 0942 08/24/19 0509  WBC 6.6 8.7  RBC 2.97* 2.99*  HGB 10.2* 10.1*  HCT 30.1* 29.4*  MCV 101.3* 98.3  MCH 34.3* 33.8  MCHC 33.9 34.4  RDW 13.7 13.8  PLT 172 180    BNP Recent Labs  Lab 08/22/19 0945  BNP 471.0*     DDimer No results for input(s): DDIMER in the last 168 hours.   Radiology    CARDIAC CATHETERIZATION  Result Date: 08/22/2019 Conclusions: 1. Severe native coronary artery disease, including 99% distal LMCA stenosis with heavy calcification, chronic total occlusion  of the ostial LCx as well as 99% stenosis of the ostial ramus.  LAD has 70% diffuse disease.  RCA demonstrates mild ostial/proximal disease as well as chronic total occlusion of the ostial RPDA. 2. Patent mid RCA stent with up to 30% focal in-stent restenosis.  Stent in RCA continuation is widely patent. 3. Widely patent LIMA to LAD. 4. Patent SVG to RPDA.  There is 90% stenosis in the dominant branch of the RPDA just distal to the anastomosis. 5. SVG to diagonal and SVG to OM are chronically occluded. 6. Mildly reduced left ventricular systolic function with hypokinesis of the basal anterior and basal inferior walls.  Normal left ventricular filling pressure. 7. Successful PCI to 90% stenosis of the RPDA distal to SVG anastomosis using Synergy 2.25 x 16 mm drug-eluting stent.  Procedure was complicated by plaque shift into the superior branch of the RPDA requiring balloon angioplasty as well as wire dissection of the proximal SVG to RPDA graft.  Dissection of the graft was treated using Synergy 3.0 x 24 mm drug-eluting stent that overlaps previously placed stents.  Final angiogram shows 30% stenosis at the ostium of the superior branch of the RPDA but otherwise no significant residual stenosis.  There is TIMI-3 flow throughout the vein graft and RPDA. Recommendations: 1. Continue indefinite dual antiplatelet therapy with aspirin and clopidogrel. 2. Aggressive antianginal therapy and escalation of antihypertensive regimen.  Nitroglycerin infusion should be weaned off, as tolerated. 3. If Mr. Austin Malone has refractory angina despite today's intervention and aggressive medical therapy, PCI to the LMCA and ostial/proximal LAD may need to be considered to improve perfusion of diagonal and septal branches.  This would require atherectomy given heavy calcification. 10/22/19, MD [facility]   DG Chest Port 1 View  Result Date: 08/22/2019 CLINICAL DATA:  Chest pain and shortness of breath 1 week. EXAM: PORTABLE  CHEST 1 VIEW COMPARISON:  04/08/2019 and 08/20/2017 FINDINGS: Sternotomy wires unchanged. Lungs are adequately inflated without focal airspace consolidation or effusion. Calcified granuloma over the right mid to lower lung unchanged. Cardiomediastinal silhouette and remainder of the exam is unchanged. IMPRESSION: No active disease. Electronically Signed   By: 10/22/2019 M.D.   On: 08/22/2019 09:58  ECHOCARDIOGRAM COMPLETE  Result Date: 08/23/2019    ECHOCARDIOGRAM REPORT   Patient Name:   MARKY BURESH Date of Exam: 08/23/2019 Medical Rec #:  [id number]         Height:       70.0 in Accession #:    [id number]        Weight:       169.5 lb Date of Birth:  12-Aug-1940         BSA:          1.946 m Patient Age:    79 years          BP:           158/74 mmHg Patient Gender: M                 HR:           65 bpm. Exam Location:  Inpatient Procedure: 2D Echo, Cardiac Doppler and Color Doppler Indications:    Chest pain  History:        Patient has prior history of Echocardiogram examinations, most                 recent 02/21/2018. CAD, Prior CABG, Arrythmias:Atrial                 Fibrillation; Risk Factors:Hypertension and Dyslipidemia. LE                 edma.  Sonographer:    Lavenia Atlas Referring Phys: [phone] CHRISTOPHER END IMPRESSIONS  1. Left ventricular ejection fraction, by estimation, is 40 to 45%. The left ventricle has mildly decreased function. The left ventricle demonstrates regional wall motion abnormalities (see scoring diagram/findings for description). The left ventricular  internal cavity size was moderately dilated. There is mild eccentric left ventricular hypertrophy. Left ventricular diastolic parameters are consistent with Grade II diastolic dysfunction (pseudonormalization). There is severe hypokinesis of the left ventricular, entire inferolateral wall.  2. Right ventricular systolic function is normal. The right ventricular size is normal. Tricuspid regurgitation signal is inadequate  for assessing PA pressure.  3. Left atrial size was moderately dilated.  4. The mitral valve is normal in structure. Mild to moderate mitral valve regurgitation.  5. The aortic valve is normal in structure. Aortic valve regurgitation is mild to moderate. Mild aortic valve sclerosis is present, with no evidence of aortic valve stenosis. Comparison(s): Prior images reviewed side by side. The left ventricular function is worsened. The left ventricular wall motion abnormality is new. FINDINGS  Left Ventricle: Left ventricular ejection fraction, by estimation, is 40 to 45%. The left ventricle has mildly decreased function. The left ventricle demonstrates regional wall motion abnormalities. Severe hypokinesis of the left ventricular, entire inferolateral wall. The left ventricular internal cavity size was moderately dilated. There is mild eccentric left ventricular hypertrophy. Left ventricular diastolic parameters are consistent with Grade II diastolic dysfunction (pseudonormalization). Right Ventricle: The right ventricular size is normal. No increase in right ventricular wall thickness. Right ventricular systolic function is normal. Tricuspid regurgitation signal is inadequate for assessing PA pressure. The tricuspid regurgitant velocity is 1.98 m/s, and with an assumed right atrial pressure of 8 mmHg, the estimated right ventricular systolic pressure is 23.7 mmHg. Left Atrium: Left atrial size was moderately dilated. Right Atrium: Right atrial size was normal in size. Pericardium: There is no evidence of pericardial effusion. Mitral Valve: The mitral valve is normal in structure. Mild mitral annular calcification. Mild to moderate mitral valve regurgitation. Tricuspid Valve:  The tricuspid valve is normal in structure. Tricuspid valve regurgitation is trivial. Aortic Valve: The aortic valve is normal in structure. Aortic valve regurgitation is mild to moderate. Mild aortic valve sclerosis is present, with no evidence  of aortic valve stenosis. Pulmonic Valve: The pulmonic valve was normal in structure. Pulmonic valve regurgitation is not visualized. Aorta: The aortic root is normal in size and structure. IAS/Shunts: The interatrial septum was not well visualized.  LEFT VENTRICLE PLAX 2D LVIDd:         6.20 cm  Diastology LVIDs:         5.00 cm  LV e' lateral:   9.90 cm/s LV PW:         1.30 cm  LV E/e' lateral: 7.3 LV IVS:        1.20 cm  LV e' medial:    6.96 cm/s LVOT diam:     2.20 cm  LV E/e' medial:  10.4 LV SV:         60 LV SV Index:   31 LVOT Area:     3.80 cm  RIGHT VENTRICLE RV Basal diam:  3.90 cm RV S prime:     9.46 cm/s LEFT ATRIUM              Index       RIGHT ATRIUM           Index LA diam:        4.40 cm  2.26 cm/m  RA Area:     15.40 cm LA Vol (A2C):   54.2 ml  27.86 ml/m RA Volume:   46.80 ml  24.05 ml/m LA Vol (A4C):   105.0 ml 53.97 ml/m LA Biplane Vol: 75.7 ml  38.91 ml/m  AORTIC VALVE LVOT Vmax:   71.80 cm/s LVOT Vmean:  46.500 cm/s LVOT VTI:    0.157 m  AORTA Ao Root diam: 2.80 cm MITRAL VALVE               TRICUSPID VALVE MV Area (PHT): 2.37 cm    TR Peak grad:   15.7 mmHg MV Decel Time: 320 msec    TR Vmax:        198.00 cm/s MV E velocity: 72.40 cm/s MV A velocity: 27.40 cm/s  SHUNTS MV E/A ratio:  2.64        Systemic VTI:  0.16 m                            Systemic Diam: 2.20 cm Thurmon Fair MD Electronically signed by Thurmon Fair MD Signature Date/Time: 08/23/2019/11:40:03 AM    Final     Cardiac Studies   LHC 08/22/19 Conclusions: 1. Severe native coronary artery disease, including 99% distal LMCA stenosis with heavy calcification, chronic total occlusion of the ostial LCx as well as 99% stenosis of the ostial ramus. LAD has 70% diffuse disease. RCA demonstrates mild ostial/proximal disease as well as chronic total occlusion of the ostial RPDA. 2. Patent mid RCA stent with up to 30% focal in-stent restenosis. Stent in RCA continuation is widely patent. 3. Widely patent LIMA to  LAD. 4. Patent SVG to RPDA. There is 90% stenosis in the dominant branch of the RPDA just distal to the anastomosis. 5. SVG to diagonal and SVG to OM are chronically occluded. 6. Mildly reduced left ventricular systolic function with hypokinesis of the basal anterior and basal inferior walls. Normal left ventricular filling pressure. 7. Successful  PCI to 90% stenosis of the RPDA distal to SVG anastomosis using Synergy 2.25 x 16 mm drug-eluting stent. Procedure was complicated by plaque shift into the superior branch of the RPDA requiring balloon angioplasty as well as wire dissection of the proximal SVG to RPDA graft. Dissection of the graft was treated using Synergy 3.0 x 24 mm drug-eluting stent that overlaps previously placed stents. Final angiogram shows 30% stenosis at the ostium of the superior branch of the RPDA but otherwise no significant residual stenosis. There is TIMI-3 flow throughout the vein graft and RPDA.  Recommendations: 1. Continue indefinite dual antiplatelet therapy with aspirin and clopidogrel. 2. Aggressive antianginal therapy and escalation of antihypertensive regimen. Nitroglycerin infusion should be weaned off, as tolerated. 3. If Mr. Jessop has refractory angina despite today's intervention and aggressive medical therapy, PCI to the LMCA and ostial/proximal LAD may need to be considered to improve perfusion of diagonal and septal branches. This would require atherectomy given heavy calcification.    Echo 08/23/19: 1. Left ventricular ejection fraction, by estimation, is 40 to 45%. The  left ventricle has mildly decreased function. The left ventricle  demonstrates regional wall motion abnormalities (see scoring  diagram/findings for description). The left ventricular  internal cavity size was moderately dilated. There is mild eccentric left  ventricular hypertrophy. Left ventricular diastolic parameters are  consistent with Grade II diastolic dysfunction  (pseudonormalization).  There is severe hypokinesis of the left  ventricular, entire inferolateral wall.  2. Right ventricular systolic function is normal. The right ventricular  size is normal. Tricuspid regurgitation signal is inadequate for assessing  PA pressure.  3. Left atrial size was moderately dilated.  4. The mitral valve is normal in structure. Mild to moderate mitral valve  regurgitation.  5. The aortic valve is normal in structure. Aortic valve regurgitation is  mild to moderate. Mild aortic valve sclerosis is present, with no evidence  of aortic valve stenosis.   Patient Profile     79 y.o. male  with CAD s/p CABG, prior PCIs, recentPCI1/20/20, pericarditis,HLD, HTN, orthostatic hypotension, PUD,chronic foley catheter,chronic appearing anemia, PAF (dx 01/2018), CKD stage III, prior GIB, chronic diastolic CHF. He was admitted yesterday with chest pain and negative troponins. EKG showed NSR with nonspecific TW changes, ST depression in anterolateral leads. Underwent cath 08/22/19 with findings above, s/p PCI to RPDA distal to SVG anastomosis, complicated by plaque shift into the superior branch of the RPDA requiring balloon angioplasty as well as wire dissection of the proximal SVG to RPDA graft. Dissection of the graft was treated using Synergy 3.0 x 24 mm drug-eluting stent that overlaps previously placed stents. Per report, if Mr. Och has refractory angina despite today's intervention and aggressive medical therapy, PCI to the LMCA and ostial/proximal LAD may need to be considered to improve perfusion of diagonal and septal branches. This would require atherectomy given heavy calcification.  Assessment & Plan    CAD  - s/p PCI as above - patient continued to have CP last night requiring nitro drip - imdur 60 mg was added yesterday  - nitro has been titrated to 65 mcg this morning - already taking ranexa - will increase imdur to 90 mg - add norvasc 2.5 mg -  discontinue nitro this morning - he describes this as the same chest pain he has had for "years" - CP with lying flat, relieved with sitting up - hesitate to proceed with atherectomy until medical therapy has been exhausted - will discuss with Dr. [place]  Hypertension - continue coreg 6.25 mg BID - will wean off nitro, increase imdur to 90 mg and add 2.5 mg norvasc   Hyperlipidemia 08/23/2019: Cholesterol 172; HDL 76; LDL Cholesterol 86; Triglycerides 52; VLDL 10 Pt reports intolerance to statins and unwilling to try a statin again - will need lipid clinic referral at discharge   Chronic diastolic heart failure - normal filling pressures - does have some lower extremity edema - received 20 mg IV lasix x 1 and 20 mg PO lasix x 1 yesterday - 1.7 L urine output yesterday - will give 40 mg IV lasix x 1 today and hold home PO dose - he only has SOB with lying down/during chest pain   PAF - in Jan 2020 - no recurrence - no AC since GI bleed 02/2018   Anemia - hb 10.1 (10.2)     For questions or updates, please contact [facility] Please consult [email or web address] for contact info under        Signed, Marcelino Dusterngela Nicole Glenora Morocho, PA  08/24/2019, 8:35 AM

## 2019-08-24 NOTE — Progress Notes (Signed)
Patient still having 9/10 chest pain despite getting 2mg  iv morphine. Paged Cardiology to review EKG and possibly restarting iv nitro. Awaiting to for MDs orders. Will continue to monitor patient.

## 2019-08-24 NOTE — Progress Notes (Addendum)
Pt developed level 2 hematoma above TR band. Manual pressure applied for 15 minutes. Site now level 1 with bruising. Will reassess in 15 minutes.

## 2019-08-24 NOTE — Progress Notes (Signed)
   08/24/19 2327  Assess: MEWS Score  BP (!) 144/65  Pulse Rate 72  ECG Heart Rate 73  Resp (!) 27  SpO2 98 %  Assess: MEWS Score  MEWS Temp 0  MEWS Systolic 0  MEWS Pulse 0  MEWS RR 2  MEWS LOC 0  MEWS Score 2  MEWS Score Color Yellow  Assess: if the MEWS score is Yellow or Red  Were vital signs taken at a resting state? Yes  Focused Assessment No change from prior assessment  Early Detection of Sepsis Score *See Row Information* Low  MEWS guidelines implemented *See Row Information* No, vital signs rechecked (RR sl. elevated, will recheck)  Treat  Pain Scale 0-10  Pain Score 3  Pain Type Acute pain  Pain Location Chest  Pain Orientation Right;Left;Anterior  Pain Descriptors / Indicators Aching  Pain Frequency Intermittent  Pain Onset Gradual  Pain Intervention(s) Medication (See eMAR)

## 2019-08-24 NOTE — Interval H&P Note (Signed)
Cath Lab Visit (complete for each Cath Lab visit)  Clinical Evaluation Leading to the Procedure:   ACS: Yes.    Non-ACS:    Anginal Classification: CCS IV  Anti-ischemic medical therapy: Minimal Therapy (1 class of medications)  Non-Invasive Test Results: No non-invasive testing performed  Prior CABG: Previous CABG   Plan for atherectomy/PCI of left main and LAD for the relief of intractable angina.    History and Physical Interval Note:  08/24/2019 1:34 PM  Austin Malone  has presented today for surgery, with the diagnosis of unstable angina.  The various methods of treatment have been discussed with the patient and family. After consideration of risks, benefits and other options for treatment, the patient has consented to  Procedure(s): CORONARY ATHERECTOMY (N/A) CORONARY STENT INTERVENTION (N/A) as a surgical intervention.  The patient's history has been reviewed, patient examined, no change in status, stable for surgery.  I have reviewed the patient's chart and labs.  Questions were answered to the patient's satisfaction.     Lance Muss

## 2019-08-25 ENCOUNTER — Encounter (HOSPITAL_COMMUNITY): Payer: Self-pay | Admitting: Interventional Cardiology

## 2019-08-25 DIAGNOSIS — Z955 Presence of coronary angioplasty implant and graft: Secondary | ICD-10-CM

## 2019-08-25 LAB — CBC
HCT: 30.2 % — ABNORMAL LOW (ref 39.0–52.0)
Hemoglobin: 10.3 g/dL — ABNORMAL LOW (ref 13.0–17.0)
MCH: 33.2 pg (ref 26.0–34.0)
MCHC: 34.1 g/dL (ref 30.0–36.0)
MCV: 97.4 fL (ref 80.0–100.0)
Platelets: 183 10*3/uL (ref 150–400)
RBC: 3.1 MIL/uL — ABNORMAL LOW (ref 4.22–5.81)
RDW: 13.7 % (ref 11.5–15.5)
WBC: 6.2 10*3/uL (ref 4.0–10.5)
nRBC: 0 % (ref 0.0–0.2)

## 2019-08-25 LAB — BASIC METABOLIC PANEL
Anion gap: 10 (ref 5–15)
BUN: 18 mg/dL (ref 8–23)
CO2: 23 mmol/L (ref 22–32)
Calcium: 8.9 mg/dL (ref 8.9–10.3)
Chloride: 103 mmol/L (ref 98–111)
Creatinine, Ser: 0.98 mg/dL (ref 0.61–1.24)
GFR calc Af Amer: >60 mL/min (ref >60)
GFR calc non Af Amer: >60 mL/min (ref >60)
Glucose, Bld: 150 mg/dL — ABNORMAL HIGH (ref 70–99)
Potassium: 4.3 mmol/L (ref 3.5–5.1)
Sodium: 136 mmol/L (ref 135–145)

## 2019-08-25 MED ORDER — AMLODIPINE BESYLATE 5 MG PO TABS: 5.0000 mg | ORAL_TABLET | Freq: Every day | ORAL | 3 refills | Status: DC

## 2019-08-25 MED ORDER — AMLODIPINE BESYLATE 5 MG PO TABS: 5.0000 mg | ORAL_TABLET | Freq: Every day | ORAL | Status: DC

## 2019-08-25 MED ORDER — NITROGLYCERIN 0.4 MG SL SUBL: SUBLINGUAL_TABLET | SUBLINGUAL | 11 refills | Status: DC

## 2019-08-25 MED ORDER — ISOSORBIDE MONONITRATE ER 30 MG PO TB24: 90.0000 mg | ORAL_TABLET | Freq: Every day | ORAL | 3 refills | Status: DC

## 2019-08-25 MED FILL — AMLODIPINE BESYLATE 5 MG TA: 5 | 90 days supply | Qty: 90 | Fill #0

## 2019-08-25 MED FILL — ISOSORBIDE MN ER 30 MG TAB: 30 | 30 days supply | Qty: 90 | Fill #0

## 2019-08-25 MED FILL — NITROGLYCERIN 0.4 MG TAB SL: 0.4 | 8 days supply | Qty: 25 | Fill #0

## 2019-08-25 NOTE — Progress Notes (Signed)
Progress Note  Patient Name: Austin Malone Date of Encounter: 08/25/2019  [facility] Cardiologist: Garwin Brothers, MD   Subjective   Nitro gtt off after atherectomy of left main yesterday. He did have mild chest pain 3/10 last evening requiring 3 nitro.   Inpatient Medications    Scheduled Meds:  alfuzosin  10 mg Oral Daily   amLODipine  2.5 mg Oral Daily   aspirin EC  81 mg Oral Daily   bethanechol  50 mg Oral TID   carvedilol  6.25 mg Oral BID   Chlorhexidine Gluconate Cloth  6 each Topical Daily   clopidogrel  75 mg Oral Daily   docusate sodium  100 mg Oral Daily   dorzolamide-timolol  1 drop Both Eyes BID   enoxaparin (LOVENOX) injection  40 mg Subcutaneous Q24H   ezetimibe  10 mg Oral Daily   finasteride  5 mg Oral Daily   furosemide  20 mg Oral Daily   isosorbide mononitrate  90 mg Oral Daily   linaclotide  290 mcg Oral QAC breakfast   melatonin  12 mg Oral QHS    morphine injection  2 mg Intravenous Once   oxybutynin  10 mg Oral Daily   pantoprazole  40 mg Oral BID   ranolazine  1,000 mg Oral BID   sodium chloride flush  3 mL Intravenous Q12H   sodium chloride flush  3 mL Intravenous Q12H   sodium chloride flush  3 mL Intravenous Q12H   sodium chloride flush  3 mL Intravenous Q12H   sodium chloride flush  3 mL Intravenous Q12H   Continuous Infusions:  sodium chloride     sodium chloride     sodium chloride     nitroGLYCERIN Stopped (08/24/19 1054)   PRN Meds: sodium chloride, sodium chloride, sodium chloride, acetaminophen, ALPRAZolam, nitroGLYCERIN, ondansetron (ZOFRAN) IV, ondansetron, sodium chloride flush, sodium chloride flush, sodium chloride flush, traMADol, zolpidem   Vital Signs    Vitals:   08/25/19 0500 08/25/19 0600 08/25/19 0602 08/25/19 0607  BP:   (!) 152/78   Pulse: 64 64 68 66  Resp: 19 12 20 19   Temp:      TempSrc:      SpO2: 96% 97% 97% 96%  Weight:      Height:        Intake/Output  Summary (Last 24 hours) at 08/25/2019 0739 Last data filed at 08/25/2019 0600 Gross per 24 hour  Intake 0 ml  Output 4575 ml  Net -4575 ml   Last 3 Weights 08/24/2019 08/23/2019 08/22/2019  Weight (lbs) 166 lb 169 lb 8 oz 176 lb  Weight (kg) 75.297 kg 76.885 kg 79.833 kg      Telemetry    Sinus rhythm with HR in the 70s - Personally Reviewed  ECG    Sinus rhythm HR 68, TW flattening lateral leads, ST depression noted yesterday improved - Personally Reviewed  Physical Exam   GEN: No acute distress.   Neck: No JVD Cardiac: RRR, no murmurs, rubs, or gallops.  Respiratory: Clear to auscultation bilaterally. GI: Soft, nontender, non-distended  MS: No edema; No deformity. Neuro:  Nonfocal  Psych: Normal affect  Right radial access site C/D/I  Labs    High Sensitivity Troponin:   Recent Labs  Lab 08/22/19 0942 08/22/19 1142  TROPONINIHS 8 10      Chemistry Recent Labs  Lab 08/22/19 0942 08/22/19 0942 08/23/19 0420 08/24/19 0509 08/25/19 0344  NA 135   < > 133*  134* 136  K 3.4*   < > 3.5 3.6 4.3  CL 101   < > 100 101 103  CO2 24   < > GLUCOSE 97   < > 203* 113* 150*  BUN 10   < > CREATININE 0.98   < > 0.95 1.00 0.98  CALCIUM 8.7*   < > 8.8* 8.8* 8.9  PROT 5.8*  --   --   --   --   ALBUMIN 3.2*  --   --   --   --   AST 17  --   --   --   --   ALT 15  --   --   --   --   ALKPHOS 36*  --   --   --   --   BILITOT 0.5  --   --   --   --   GFRNONAA >60   < > >60 >60 >60  GFRAA >60   < > >60 >60 >60  ANIONGAP 10   < > < > = values in this interval not displayed.     Hematology Recent Labs  Lab 08/22/19 0942 08/24/19 0509 08/25/19 0344  WBC 6.6 8.7 6.2  RBC 2.97* 2.99* 3.10*  HGB 10.2* 10.1* 10.3*  HCT 30.1* 29.4* 30.2*  MCV 101.3* 98.3 97.4  MCH 34.3* 33.8 33.2  MCHC 33.9 34.4 34.1  RDW 13.7 13.8 13.7  PLT 172 180 183    BNP Recent Labs  Lab 08/22/19 0945  BNP 471.0*     DDimer No results for input(s): DDIMER in the  last 168 hours.   Radiology    CARDIAC CATHETERIZATION  Result Date: 08/24/2019  SVG to RCA graft is patent.  Ost LM to Mid LM lesion is 99% stenosed. Treated with atherectomy, shockwave intravascular lithotripsy and then A drug-eluting stent was successfully placed using a STENT RESOLUTE ONYX 3.5X12.  Post intervention, there is a 0% residual stenosis.  Ost LAD to Prox LAD lesion is 70% stenosed. Treated with atherectomy, shockwave intravascular lithotripsy and then A drug-eluting stent was successfully placed using a STENT RESOLUTE ONYX 3.0X18.  Post intervention, there is a 0% residual stenosis.  A drug-eluting stent was successfully placed using a STENT RESOLUTE ONYX 3.5X12.  A drug-eluting stent was successfully placed using a STENT RESOLUTE ONYX 3.0X18.  Continue aggressive secondary prevention.  Consider clopidogrel monotherapy after DAPT course is complete.   PERIPHERAL VASCULAR CATHETERIZATION  Result Date: 08/24/2019  SVG to RCA graft is patent.  Ost LM to Mid LM lesion is 99% stenosed. Treated with atherectomy, shockwave intravascular lithotripsy and then A drug-eluting stent was successfully placed using a STENT RESOLUTE ONYX 3.5X12.  Post intervention, there is a 0% residual stenosis.  Ost LAD to Prox LAD lesion is 70% stenosed. Treated with atherectomy, shockwave intravascular lithotripsy and then A drug-eluting stent was successfully placed using a STENT RESOLUTE ONYX 3.0X18.  Post intervention, there is a 0% residual stenosis.  A drug-eluting stent was successfully placed using a STENT RESOLUTE ONYX 3.5X12.  A drug-eluting stent was successfully placed using a STENT RESOLUTE ONYX 3.0X18.  Continue aggressive secondary prevention.  Consider clopidogrel monotherapy after DAPT course is complete.   ECHOCARDIOGRAM COMPLETE  Result Date: 08/23/2019    ECHOCARDIOGRAM REPORT   Patient Name:   Austin Malone Date of Exam: 08/23/2019 Medical Rec #:  [id number]  Height:        70.0 in Accession #:    [id number]        Weight:       169.5 lb Date of Birth:  05-20-1940         BSA:          1.946 m Patient Age:    79 years          BP:           158/74 mmHg Patient Gender: M                 HR:           65 bpm. Exam Location:  Inpatient Procedure: 2D Echo, Cardiac Doppler and Color Doppler Indications:    Chest pain  History:        Patient has prior history of Echocardiogram examinations, most                 recent 02/21/2018. CAD, Prior CABG, Arrythmias:Atrial                 Fibrillation; Risk Factors:Hypertension and Dyslipidemia. LE                 edma.  Sonographer:    Lavenia AtlasBrooke Strickland Referring Phys: [phone] CHRISTOPHER END IMPRESSIONS  1. Left ventricular ejection fraction, by estimation, is 40 to 45%. The left ventricle has mildly decreased function. The left ventricle demonstrates regional wall motion abnormalities (see scoring diagram/findings for description). The left ventricular  internal cavity size was moderately dilated. There is mild eccentric left ventricular hypertrophy. Left ventricular diastolic parameters are consistent with Grade II diastolic dysfunction (pseudonormalization). There is severe hypokinesis of the left ventricular, entire inferolateral wall.  2. Right ventricular systolic function is normal. The right ventricular size is normal. Tricuspid regurgitation signal is inadequate for assessing PA pressure.  3. Left atrial size was moderately dilated.  4. The mitral valve is normal in structure. Mild to moderate mitral valve regurgitation.  5. The aortic valve is normal in structure. Aortic valve regurgitation is mild to moderate. Mild aortic valve sclerosis is present, with no evidence of aortic valve stenosis. Comparison(s): Prior images reviewed side by side. The left ventricular function is worsened. The left ventricular wall motion abnormality is new. FINDINGS  Left Ventricle: Left ventricular ejection fraction, by estimation, is 40 to 45%. The left  ventricle has mildly decreased function. The left ventricle demonstrates regional wall motion abnormalities. Severe hypokinesis of the left ventricular, entire inferolateral wall. The left ventricular internal cavity size was moderately dilated. There is mild eccentric left ventricular hypertrophy. Left ventricular diastolic parameters are consistent with Grade II diastolic dysfunction (pseudonormalization). Right Ventricle: The right ventricular size is normal. No increase in right ventricular wall thickness. Right ventricular systolic function is normal. Tricuspid regurgitation signal is inadequate for assessing PA pressure. The tricuspid regurgitant velocity is 1.98 m/s, and with an assumed right atrial pressure of 8 mmHg, the estimated right ventricular systolic pressure is 23.7 mmHg. Left Atrium: Left atrial size was moderately dilated. Right Atrium: Right atrial size was normal in size. Pericardium: There is no evidence of pericardial effusion. Mitral Valve: The mitral valve is normal in structure. Mild mitral annular calcification. Mild to moderate mitral valve regurgitation. Tricuspid Valve: The tricuspid valve is normal in structure. Tricuspid valve regurgitation is trivial. Aortic Valve: The aortic valve is normal in structure. Aortic valve regurgitation is mild to moderate. Mild aortic valve sclerosis is present, with no  evidence of aortic valve stenosis. Pulmonic Valve: The pulmonic valve was normal in structure. Pulmonic valve regurgitation is not visualized. Aorta: The aortic root is normal in size and structure. IAS/Shunts: The interatrial septum was not well visualized.  LEFT VENTRICLE PLAX 2D LVIDd:         6.20 cm  Diastology LVIDs:         5.00 cm  LV e' lateral:   9.90 cm/s LV PW:         1.30 cm  LV E/e' lateral: 7.3 LV IVS:        1.20 cm  LV e' medial:    6.96 cm/s LVOT diam:     2.20 cm  LV E/e' medial:  10.4 LV SV:         60 LV SV Index:   31 LVOT Area:     3.80 cm  RIGHT VENTRICLE RV  Basal diam:  3.90 cm RV S prime:     9.46 cm/s LEFT ATRIUM              Index       RIGHT ATRIUM           Index LA diam:        4.40 cm  2.26 cm/m  RA Area:     15.40 cm LA Vol (A2C):   54.2 ml  27.86 ml/m RA Volume:   46.80 ml  24.05 ml/m LA Vol (A4C):   105.0 ml 53.97 ml/m LA Biplane Vol: 75.7 ml  38.91 ml/m  AORTIC VALVE LVOT Vmax:   71.80 cm/s LVOT Vmean:  46.500 cm/s LVOT VTI:    0.157 m  AORTA Ao Root diam: 2.80 cm MITRAL VALVE               TRICUSPID VALVE MV Area (PHT): 2.37 cm    TR Peak grad:   15.7 mmHg MV Decel Time: 320 msec    TR Vmax:        198.00 cm/s MV E velocity: 72.40 cm/s MV A velocity: 27.40 cm/s  SHUNTS MV E/A ratio:  2.64        Systemic VTI:  0.16 m                            Systemic Diam: 2.20 cm Rachelle Hora Croitoru MD Electronically signed by Thurmon Fair MD Signature Date/Time: 08/23/2019/11:40:03 AM    Final     Cardiac Studies   Coronary atherectomy and lithotripsy 08/24/19:  SVG to RCA graft is patent.  Ost LM to Mid LM lesion is 99% stenosed. Treated with atherectomy, shockwave intravascular lithotripsy and then A drug-eluting stent was successfully placed using a STENT RESOLUTE ONYX 3.5X12.  Post intervention, there is a 0% residual stenosis.  Ost LAD to Prox LAD lesion is 70% stenosed. Treated with atherectomy, shockwave intravascular lithotripsy and then A drug-eluting stent was successfully placed using a STENT RESOLUTE ONYX 3.0X18.  Post intervention, there is a 0% residual stenosis.  A drug-eluting stent was successfully placed using a STENT RESOLUTE ONYX 3.5X12.  A drug-eluting stent was successfully placed using a STENT RESOLUTE ONYX 3.0X18.   Continue aggressive secondary prevention.  Consider clopidogrel monotherapy after DAPT course is complete.     LHC 08/22/19 Conclusions: 1. Severe native coronary artery disease, including 99% distal LMCA stenosis with heavy calcification, chronic total occlusion of the ostial LCx as well as 99% stenosis of  the ostial ramus. LAD has  70% diffuse disease. RCA demonstrates mild ostial/proximal disease as well as chronic total occlusion of the ostial RPDA. 2. Patent mid RCA stent with up to 30% focal in-stent restenosis. Stent in RCA continuation is widely patent. 3. Widely patent LIMA to LAD. 4. Patent SVG to RPDA. There is 90% stenosis in the dominant branch of the RPDA just distal to the anastomosis. 5. SVG to diagonal and SVG to OM are chronically occluded. 6. Mildly reduced left ventricular systolic function with hypokinesis of the basal anterior and basal inferior walls. Normal left ventricular filling pressure. 7. Successful PCI to 90% stenosis of the RPDA distal to SVG anastomosis using Synergy 2.25 x 16 mm drug-eluting stent. Procedure was complicated by plaque shift into the superior branch of the RPDA requiring balloon angioplasty as well as wire dissection of the proximal SVG to RPDA graft. Dissection of the graft was treated using Synergy 3.0 x 24 mm drug-eluting stent that overlaps previously placed stents. Final angiogram shows 30% stenosis at the ostium of the superior branch of the RPDA but otherwise no significant residual stenosis. There is TIMI-3 flow throughout the vein graft and RPDA.  Recommendations: 1. Continue indefinite dual antiplatelet therapy with aspirin and clopidogrel. 2. Aggressive antianginal therapy and escalation of antihypertensive regimen. Nitroglycerin infusion should be weaned off, as tolerated. 3. If Mr. Colpitts has refractory angina despite today's intervention and aggressive medical therapy, PCI to the LMCA and ostial/proximal LAD may need to be considered to improve perfusion of diagonal and septal branches. This would require atherectomy given heavy calcification.    Echo 08/23/19: 1. Left ventricular ejection fraction, by estimation, is 40 to 45%. The  left ventricle has mildly decreased function. The left ventricle  demonstrates regional wall  motion abnormalities (see scoring  diagram/findings for description). The left ventricular  internal cavity size was moderately dilated. There is mild eccentric left  ventricular hypertrophy. Left ventricular diastolic parameters are  consistent with Grade II diastolic dysfunction (pseudonormalization).  There is severe hypokinesis of the left  ventricular, entire inferolateral wall.  2. Right ventricular systolic function is normal. The right ventricular  size is normal. Tricuspid regurgitation signal is inadequate for assessing  PA pressure.  3. Left atrial size was moderately dilated.  4. The mitral valve is normal in structure. Mild to moderate mitral valve  regurgitation.  5. The aortic valve is normal in structure. Aortic valve regurgitation is  mild to moderate. Mild aortic valve sclerosis is present, with no evidence  of aortic valve stenosis.   Patient Profile     79 y.o. male withCAD s/p CABG, prior PCIs, recentPCI1/20/20,pericarditis,HLD, HTN,orthostatic hypotension,PUD,chronic foley catheter,chronic appearing anemia, PAF (dx 01/2018), CKD stage III, prior GIB, chronic diastolic CHF. He was admitted yesterday with chest pain and negative troponins. EKG showed NSR with nonspecific TW changes,ST depression in anterolateral leads. Underwent cath 08/22/19 with findings above, s/p PCI to RPDA distal to SVG anastomosis, complicated byplaque shift into the superior branch of the RPDA requiring balloon angioplasty as well as wire dissection of the proximal SVG to RPDA graft. Dissection of the graft was treated using Synergy 3.0 x 24 mm drug-eluting stent that overlaps previously placed stents.Mr. Vandevoort continued to have chest pain requiring nitro drip. He subsequently underwent coronary atherectomy and lithotripsy of his left main/proximal LAD.   Assessment & Plan    CAD s/p CABG - heart cath 08/22/19 with 2/4 grafts patent, DES to distal RPDA, graft dissection treated  with DES - he had residual angina requiring nitro  drip yesterday prompting discussion regarding atherectomy of left main - PCI on 08/24/19 with atherectomy, lithotripsy, and DES to 99% stenosis in the left main and ostial to proximal LAD - he tolerated the procedure well and is without chest pain today, but did require nitro x 3 last evening - he does report his CP overnight was much more mild compared to his usual nocturnal CP - continue ASA and plavix - coreg 6.25 mg BID, 90 mg imdur, 1000 mg BID ranexa, and norvasc 2.5 mg - given his chest pain overnight, could consider moving his norvasc to evening dosing - also consider increasing imdur to 120 mg in the outpatient setting if he continues to wake up with CP   Hypertension - BP has been labile since yesterday, will observe following home medications today - increased imdur and added norvasc as anti-anginals yesterday in addition to better pressure control   Chronic diastolic heart failure - due to edema and reports of dyspnea and mild orthopnea, 40 mg IV lasix yesterday with excellent results: 4.5 L urine output yesterday and overall net negative 6.7 L - resume home PO lasix dose today   Hyperlipidemia with LDL goal< 70 08/23/2019: Cholesterol 172; HDL 76; LDL Cholesterol 86; Triglycerides 52; VLDL 10 Continue zetia. Pt reports statin intolerance and is unwilling to try another statin Will need referral to lipid clinic for consideration of PCSK9i.    PAF - noted in Jan 2020, has not had a recurrence - no anticoagulation since GIB 02/2018 - maintaining sinus rhythm   Anemia - Hb 10.3 - stable following heart cath    For questions or updates, please contact [facility] Please consult [email or web address] for contact info under        Signed, Marcelino Duster, PA  08/25/2019, 7:39 AM

## 2019-08-25 NOTE — Progress Notes (Signed)
CARDIAC REHAB PHASE I   PRE:  Rate/Rhythm: 69 SR    BP: sitting 137/73    SaO2: 97 RA  MODE:  Ambulation: 370 ft   POST:  Rate/Rhythm: 78 SR    BP: sitting 164/81     SaO2: 97 RA  Pt feeling well. Able to walk hall with RW, slow and steady. No angina, VSS although BP elevated. Reviewed ed, has stent card.  [id number]   Harriet Masson CES, ACSM 08/25/2019 10:19 AM

## 2019-08-25 NOTE — Progress Notes (Signed)
Discharge instructions including but not limited to f/u appmts, daily wt, diet, & meds given to pt. All questions & education verified via teachback method. prescriptions given to pt. All belongings gathered & sent with pt. All monitors & IVs removed. Sanda Linger, RN

## 2019-08-25 NOTE — Discharge Summary (Signed)
Discharge Summary    Patient ID: Austin Malone MRN: [id number]; DOB: Nov 27, 1940  Admit date: 08/22/2019 Discharge date: 08/25/2019  Primary Care Provider: Galvin Proffer, MD  Primary Cardiologist: Garwin Brothers, MD  Primary Electrophysiologist:  None   Discharge Diagnoses    Principal Problem:   Coronary artery disease involving coronary bypass graft of native heart with unstable angina pectoris [facility]) Active Problems:   Gastroesophageal reflux disease without esophagitis   Hx of CABG   Hypercholesteremia   Alcohol abuse   Chronic diastolic CHF (congestive heart failure) (HCC)   Essential hypertension   CKD (chronic kidney disease) stage 3, GFR 30-59 ml/min   Unstable angina (HCC)   Status post coronary artery stent placement    Diagnostic Studies/Procedures    Coronary atherectomy and lithotripsy 08/24/19:  SVG to RCA graft is patent.  Ost LM to Mid LM lesion is 99% stenosed. Treated with atherectomy, shockwave intravascular lithotripsy and then A drug-eluting stent was successfully placed using a STENT RESOLUTE ONYX 3.5X12.  Post intervention, there is a 0% residual stenosis.  Ost LAD to Prox LAD lesion is 70% stenosed. Treated with atherectomy, shockwave intravascular lithotripsy and then A drug-eluting stent was successfully placed using a STENT RESOLUTE ONYX 3.0X18.  Post intervention, there is a 0% residual stenosis.  A drug-eluting stent was successfully placed using a STENT RESOLUTE ONYX 3.5X12.  A drug-eluting stent was successfully placed using a STENT RESOLUTE ONYX 3.0X18.  Continue aggressive secondary prevention. Consider clopidogrel monotherapy after DAPT course is complete.   _____________  [facility] 08/22/19 Conclusions: 1. Severe native coronary artery disease, including 99% distal LMCA stenosis with heavy calcification, chronic total occlusion of the ostial LCx as well as 99% stenosis of the ostial ramus. LAD has 70% diffuse disease. RCA  demonstrates mild ostial/proximal disease as well as chronic total occlusion of the ostial RPDA. 2. Patent mid RCA stent with up to 30% focal in-stent restenosis. Stent in RCA continuation is widely patent. 3. Widely patent LIMA to LAD. 4. Patent SVG to RPDA. There is 90% stenosis in the dominant branch of the RPDA just distal to the anastomosis. 5. SVG to diagonal and SVG to OM are chronically occluded. 6. Mildly reduced left ventricular systolic function with hypokinesis of the basal anterior and basal inferior walls. Normal left ventricular filling pressure. 7. Successful PCI to 90% stenosis of the RPDA distal to SVG anastomosis using Synergy 2.25 x 16 mm drug-eluting stent. Procedure was complicated by plaque shift into the superior branch of the RPDA requiring balloon angioplasty as well as wire dissection of the proximal SVG to RPDA graft. Dissection of the graft was treated using Synergy 3.0 x 24 mm drug-eluting stent that overlaps previously placed stents. Final angiogram shows 30% stenosis at the ostium of the superior branch of the RPDA but otherwise no significant residual stenosis. There is TIMI-3 flow throughout the vein graft and RPDA.  Recommendations: 1. Continue indefinite dual antiplatelet therapy with aspirin and clopidogrel. 2. Aggressive antianginal therapy and escalation of antihypertensive regimen. Nitroglycerin infusion should be weaned off, as tolerated. 3. If Austin Malone has refractory angina despite today's intervention and aggressive medical therapy, PCI to the LMCA and ostial/proximal LAD may need to be considered to improve perfusion of diagonal and septal branches. This would require atherectomy given heavy calcification.  _____________  Echo 08/23/19: 1. Left ventricular ejection fraction, by estimation, is 40 to 45%. The  left ventricle has mildly decreased function. The left ventricle  demonstrates regional wall motion  abnormalities (see scoring   diagram/findings for description). The left ventricular  internal cavity size was moderately dilated. There is mild eccentric left  ventricular hypertrophy. Left ventricular diastolic parameters are  consistent with Grade II diastolic dysfunction (pseudonormalization).  There is severe hypokinesis of the left  ventricular, entire inferolateral wall.  2. Right ventricular systolic function is normal. The right ventricular  size is normal. Tricuspid regurgitation signal is inadequate for assessing  PA pressure.  3. Left atrial size was moderately dilated.  4. The mitral valve is normal in structure. Mild to moderate mitral valve  regurgitation.  5. The aortic valve is normal in structure. Aortic valve regurgitation is  mild to moderate. Mild aortic valve sclerosis is present, with no evidence  of aortic valve stenosis.  _____________   History of Present Illness     Austin Malone is a 79 y.o. male with a history of CAD s/p CABG, prior PCIs, recentPCI1/20/20,pericarditis,HLD, HTN,orthostatic hypotension,PUD,chronic foley catheter,chronic appearing anemia, PAF (dx 01/2018), CKD stage III, prior GIB, chronic diastolic CHF.   Austin Malone is followed by Dr. Tomie China for the above cardiac issues. He has a history of CAD s/p CABG 15 years ago and stenting in 2016. He had a cath in 05/2018 showing Ost LM to mid LM lesion 99% stenosed, Ost Cx to Prox Cx lesion is 100% stenosed, Ost LAD to Prox LAD lesion is 70% stenosed, Ost ramus lesion 99% stenosed, Ost RPDA 100% stenosed, Ost RCA to pro PCA lesion 30% stenosed, previously placed prox to Mid RCA stent patent, previously placed post atrio stent (unknown type) widely patent, previously placed prox graft to mid graft stent widely patent, dist graft to insertion 30% stenosed, origin to prox graft 100% stenosed. This is relatively unchanged disease compared to post native RCA intervention in January. No culprit lesions at that time for pain  however suspected occluded cx and ramus branch. Medical therapy was recommended. Last echo was limited echo which showed LVEF 60-65%, trivial pericardial effusion.   He was last seen 05/25/19 for pre-op assessment for a urological procedure. He was symptomatically stable at that time.   The patient presented to the ED 08/22/19 for chest pain. Patient says he has intermittent chest pain, it can happen every night or it might 4-5 days without it. Pain is in the center of the chest and up towards the clavicles. It radiates down both arms. He has associated sob with the pain. Whenever he had pain onset he would take a nitro and the pain would resolve in 5 minutes. He called the cardiology office Friday and they recommended ER eval however he wanted to wait to see if it subsided on it's own. He had another episode last night and decided to go to the ER for evaluation. He also reports worsening LLE for the last week. Denies weights gain. Says he normally wears compression stockings   In the ED BP 165/75, RR21, pulse 61, 99%  O2. Labs showed potassium 3.4, glucose 97, creatinine 0.98, albumin 3.2, WBC 6.6, Hgb 10.2. HS troponin 8. BNP 471. CXR unremarkable.Initial EKG showed NSR HR 60 with nonspecific T wave changes. Patient had recurrent chest pain. Given SL nitro x 2 which relieved the pain. EKG showed ST depression in anterolateral leads and cardiology was consulted for possible admission.   Hospital Course     Consultants: none  CAD s/p CABG He was admitted 08/22/19 with chest pain andnegative troponins. EKG showed NSR with nonspecific TW changes,ST depression in anterolateral leads.  Underwent cath 08/22/19 with findings above, s/p PCI to RPDA distal to SVG anastomosis, complicated byplaque shift into the superior branch of the RPDA requiring balloon angioplasty as well as wire dissection of the proximal SVG to RPDA graft. Dissection of the graft was treated using Synergy 3.0 x 24 mm drug-eluting stent  that overlaps previously placed stents.Austin Malone continued to have chest pain requiring nitro drip. He subsequently underwent coronary atherectomy and lithotripsy of his left main/proximal LAD. He tolerated the procedures well and recovered on telemetry floor.  Coreg 6.25 mg BID, increased imdur to 90 mg daily, continue 1000 mg ranexa BID, and started 5 mg norvasc to be taken in the evenings. He still woke up three times last evening to take nitro, but reported his usual CP was much less at a 3/10 - take norvasc at night to see if this helps with nocturnal angina.    Hypertension Medications as above. Titrate as needed.    Chronic diastolic heart failure Received 40 mg IV lasix x 1 dose 08/24/19 for dyspnea, lower extremity swelling, and orthopnea with very good diuresis of 4.5 L urine output. Continue home dose of lasix.   Hyperlipidemia with LDL goal< 70 08/23/2019: Cholesterol 172; HDL 76; LDL Cholesterol 86; Triglycerides 52; VLDL 10 Continue zetia. Pt reports statin intolerance and is unwilling to try another statin Will need referral to lipid clinic for consideration of PCSK9i.    PAF - noted in Jan 2020, has not had a recurrence - no anticoagulation since GIB 02/2018 - maintaining sinus rhythm   Anemia - Hb 10.3 - stable following heart cath    Did the patient have an acute coronary syndrome (MI, NSTEMI, STEMI, etc) this admission?:  Yes                               AHA/ACC Clinical Performance & Quality Measures: 8. Aspirin prescribed? - Yes 9. ADP Receptor Inhibitor (Plavix/Clopidogrel, Brilinta/Ticagrelor or Effient/Prasugrel) prescribed (includes medically managed patients)? - Yes 10. Beta Blocker prescribed? - Yes 11. High Intensity Statin (Lipitor 40-80mg  or Crestor 20-40mg ) prescribed? - No - intolerant to statin 12. EF assessed during THIS hospitalization? - Yes 13. For EF <40%, was ACEI/ARB prescribed? - Not Applicable (EF >/= 40%) 14. For EF <40%,  Aldosterone Antagonist (Spironolactone or Eplerenone) prescribed? - Not Applicable (EF >/= 40%) 15. Cardiac Rehab Phase II ordered (including medically managed patients)? - Yes   _____________  Discharge Vitals Blood pressure (!) 144/91, pulse 66, temperature 97.7 F (36.5 C), temperature source Axillary, resp. rate 17, height 5\' 10"  (1.778 m), weight 75.3 kg, SpO2 98 %.  Filed Weights   08/22/19 0923 08/23/19 0444 08/24/19 1203  Weight: 79.8 kg 76.9 kg 75.3 kg    Labs & Radiologic Studies    CBC Recent Labs    08/24/19 0509 08/25/19 0344  WBC 8.7 6.2  HGB 10.1* 10.3*  HCT 29.4* 30.2*  MCV 98.3 97.4  PLT 180 183   Basic Metabolic Panel Recent Labs    16/10/96 1428 08/23/19 0420 08/24/19 0509 08/25/19 0344  NA  --    < > 134* 136  K  --    < > 3.6 4.3  CL  --    < > 101 103  CO2  --    < > 24 23  GLUCOSE  --    < > 113* 150*  BUN  --    < > 16 18  CREATININE  --    < > 1.00 0.98  CALCIUM  --    < > 8.8* 8.9  MG 2.0  --   --   --    < > = values in this interval not displayed.   Liver Function Tests No results for input(s): AST, ALT, ALKPHOS, BILITOT, PROT, ALBUMIN in the last 72 hours. No results for input(s): LIPASE, AMYLASE in the last 72 hours. High Sensitivity Troponin:   Recent Labs  Lab 08/22/19 0942 08/22/19 1142  TROPONINIHS 8 10    BNP Invalid input(s): POCBNP D-Dimer No results for input(s): DDIMER in the last 72 hours. Hemoglobin A1C Recent Labs    08/22/19 1427  HGBA1C 5.4   Fasting Lipid Panel Recent Labs    08/23/19 0420  CHOL 172  HDL 76  LDLCALC 86  TRIG 52  CHOLHDL 2.3   Thyroid Function Tests Recent Labs    08/22/19 1427  TSH 1.482   _____________  CARDIAC CATHETERIZATION  Result Date: 08/24/2019  SVG to RCA graft is patent.  Ost LM to Mid LM lesion is 99% stenosed. Treated with atherectomy, shockwave intravascular lithotripsy and then A drug-eluting stent was successfully placed using a STENT RESOLUTE ONYX 3.5X12.   Post intervention, there is a 0% residual stenosis.  Ost LAD to Prox LAD lesion is 70% stenosed. Treated with atherectomy, shockwave intravascular lithotripsy and then A drug-eluting stent was successfully placed using a STENT RESOLUTE ONYX 3.0X18.  Post intervention, there is a 0% residual stenosis.  A drug-eluting stent was successfully placed using a STENT RESOLUTE ONYX 3.5X12.  A drug-eluting stent was successfully placed using a STENT RESOLUTE ONYX 3.0X18.  Continue aggressive secondary prevention.  Consider clopidogrel monotherapy after DAPT course is complete.   CARDIAC CATHETERIZATION  Result Date: 08/22/2019 Conclusions: 1. Severe native coronary artery disease, including 99% distal LMCA stenosis with heavy calcification, chronic total occlusion of the ostial LCx as well as 99% stenosis of the ostial ramus.  LAD has 70% diffuse disease.  RCA demonstrates mild ostial/proximal disease as well as chronic total occlusion of the ostial RPDA. 2. Patent mid RCA stent with up to 30% focal in-stent restenosis.  Stent in RCA continuation is widely patent. 3. Widely patent LIMA to LAD. 4. Patent SVG to RPDA.  There is 90% stenosis in the dominant branch of the RPDA just distal to the anastomosis. 5. SVG to diagonal and SVG to OM are chronically occluded. 6. Mildly reduced left ventricular systolic function with hypokinesis of the basal anterior and basal inferior walls.  Normal left ventricular filling pressure. 7. Successful PCI to 90% stenosis of the RPDA distal to SVG anastomosis using Synergy 2.25 x 16 mm drug-eluting stent.  Procedure was complicated by plaque shift into the superior branch of the RPDA requiring balloon angioplasty as well as wire dissection of the proximal SVG to RPDA graft.  Dissection of the graft was treated using Synergy 3.0 x 24 mm drug-eluting stent that overlaps previously placed stents.  Final angiogram shows 30% stenosis at the ostium of the superior branch of the RPDA but  otherwise no significant residual stenosis.  There is TIMI-3 flow throughout the vein graft and RPDA. Recommendations: 1. Continue indefinite dual antiplatelet therapy with aspirin and clopidogrel. 2. Aggressive antianginal therapy and escalation of antihypertensive regimen.  Nitroglycerin infusion should be weaned off, as tolerated. 3. If Austin Malone has refractory angina despite today's intervention and aggressive medical therapy, PCI to the LMCA and ostial/proximal LAD may need to  be considered to improve perfusion of diagonal and septal branches.  This would require atherectomy given heavy calcification. Yvonne Kendall, MD [facility]   PERIPHERAL VASCULAR CATHETERIZATION  Result Date: 08/24/2019  SVG to RCA graft is patent.  Ost LM to Mid LM lesion is 99% stenosed. Treated with atherectomy, shockwave intravascular lithotripsy and then A drug-eluting stent was successfully placed using a STENT RESOLUTE ONYX 3.5X12.  Post intervention, there is a 0% residual stenosis.  Ost LAD to Prox LAD lesion is 70% stenosed. Treated with atherectomy, shockwave intravascular lithotripsy and then A drug-eluting stent was successfully placed using a STENT RESOLUTE ONYX 3.0X18.  Post intervention, there is a 0% residual stenosis.  A drug-eluting stent was successfully placed using a STENT RESOLUTE ONYX 3.5X12.  A drug-eluting stent was successfully placed using a STENT RESOLUTE ONYX 3.0X18.  Continue aggressive secondary prevention.  Consider clopidogrel monotherapy after DAPT course is complete.   DG Chest Port 1 View  Result Date: 08/22/2019 CLINICAL DATA:  Chest pain and shortness of breath 1 week. EXAM: PORTABLE CHEST 1 VIEW COMPARISON:  04/08/2019 and 08/20/2017 FINDINGS: Sternotomy wires unchanged. Lungs are adequately inflated without focal airspace consolidation or effusion. Calcified granuloma over the right mid to lower lung unchanged. Cardiomediastinal silhouette and remainder of the exam is  unchanged. IMPRESSION: No active disease. Electronically Signed   By: Elberta Fortis M.D.   On: 08/22/2019 09:58   ECHOCARDIOGRAM COMPLETE  Result Date: 08/23/2019    ECHOCARDIOGRAM REPORT   Patient Name:   Austin Malone Date of Exam: 08/23/2019 Medical Rec #:  [id number]         Height:       70.0 in Accession #:    [id number]        Weight:       169.5 lb Date of Birth:  [birth year]         BSA:          1.946 m Patient Age:    79 years          BP:           158/74 mmHg Patient Gender: M                 HR:           65 bpm. Exam Location:  Inpatient Procedure: 2D Echo, Cardiac Doppler and Color Doppler Indications:    Chest pain  History:        Patient has prior history of Echocardiogram examinations, most                 recent 02/21/2018. CAD, Prior CABG, Arrythmias:Atrial                 Fibrillation; Risk Factors:Hypertension and Dyslipidemia. LE                 edma.  Sonographer:    Lavenia Atlas Referring Phys: [phone] CHRISTOPHER END IMPRESSIONS  1. Left ventricular ejection fraction, by estimation, is 40 to 45%. The left ventricle has mildly decreased function. The left ventricle demonstrates regional wall motion abnormalities (see scoring diagram/findings for description). The left ventricular  internal cavity size was moderately dilated. There is mild eccentric left ventricular hypertrophy. Left ventricular diastolic parameters are consistent with Grade II diastolic dysfunction (pseudonormalization). There is severe hypokinesis of the left ventricular, entire inferolateral wall.  2. Right ventricular systolic function is normal. The right ventricular size is normal. Tricuspid regurgitation signal is inadequate for assessing PA  pressure.  3. Left atrial size was moderately dilated.  4. The mitral valve is normal in structure. Mild to moderate mitral valve regurgitation.  5. The aortic valve is normal in structure. Aortic valve regurgitation is mild to moderate. Mild aortic valve sclerosis is  present, with no evidence of aortic valve stenosis. Comparison(s): Prior images reviewed side by side. The left ventricular function is worsened. The left ventricular wall motion abnormality is new. FINDINGS  Left Ventricle: Left ventricular ejection fraction, by estimation, is 40 to 45%. The left ventricle has mildly decreased function. The left ventricle demonstrates regional wall motion abnormalities. Severe hypokinesis of the left ventricular, entire inferolateral wall. The left ventricular internal cavity size was moderately dilated. There is mild eccentric left ventricular hypertrophy. Left ventricular diastolic parameters are consistent with Grade II diastolic dysfunction (pseudonormalization). Right Ventricle: The right ventricular size is normal. No increase in right ventricular wall thickness. Right ventricular systolic function is normal. Tricuspid regurgitation signal is inadequate for assessing PA pressure. The tricuspid regurgitant velocity is 1.98 m/s, and with an assumed right atrial pressure of 8 mmHg, the estimated right ventricular systolic pressure is 23.7 mmHg. Left Atrium: Left atrial size was moderately dilated. Right Atrium: Right atrial size was normal in size. Pericardium: There is no evidence of pericardial effusion. Mitral Valve: The mitral valve is normal in structure. Mild mitral annular calcification. Mild to moderate mitral valve regurgitation. Tricuspid Valve: The tricuspid valve is normal in structure. Tricuspid valve regurgitation is trivial. Aortic Valve: The aortic valve is normal in structure. Aortic valve regurgitation is mild to moderate. Mild aortic valve sclerosis is present, with no evidence of aortic valve stenosis. Pulmonic Valve: The pulmonic valve was normal in structure. Pulmonic valve regurgitation is not visualized. Aorta: The aortic root is normal in size and structure. IAS/Shunts: The interatrial septum was not well visualized.  LEFT VENTRICLE PLAX 2D LVIDd:          6.20 cm  Diastology LVIDs:         5.00 cm  LV e' lateral:   9.90 cm/s LV PW:         1.30 cm  LV E/e' lateral: 7.3 LV IVS:        1.20 cm  LV e' medial:    6.96 cm/s LVOT diam:     2.20 cm  LV E/e' medial:  10.4 LV SV:         60 LV SV Index:   31 LVOT Area:     3.80 cm  RIGHT VENTRICLE RV Basal diam:  3.90 cm RV S prime:     9.46 cm/s LEFT ATRIUM              Index       RIGHT ATRIUM           Index LA diam:        4.40 cm  2.26 cm/m  RA Area:     15.40 cm LA Vol (A2C):   54.2 ml  27.86 ml/m RA Volume:   46.80 ml  24.05 ml/m LA Vol (A4C):   105.0 ml 53.97 ml/m LA Biplane Vol: 75.7 ml  38.91 ml/m  AORTIC VALVE LVOT Vmax:   71.80 cm/s LVOT Vmean:  46.500 cm/s LVOT VTI:    0.157 m  AORTA Ao Root diam: 2.80 cm MITRAL VALVE               TRICUSPID VALVE MV Area (PHT): 2.37 cm    TR Peak grad:  15.7 mmHg MV Decel Time: 320 msec    TR Vmax:        198.00 cm/s MV E velocity: 72.40 cm/s MV A velocity: 27.40 cm/s  SHUNTS MV E/A ratio:  2.64        Systemic VTI:  0.16 m                            Systemic Diam: 2.20 cm Rachelle Hora Croitoru MD Electronically signed by Thurmon Fair MD Signature Date/Time: 08/23/2019/11:40:03 AM    Final    Disposition   Pt is being discharged home today in good condition.  Follow-up Plans & Appointments     Follow-up Information    Revankar, Aundra Dubin, MD Follow up on 09/06/2019.   Specialty: Cardiology Why: Please arrive 15 minutes early for your 10:40am post-hospital cardiology follow-up appointment Contact information: [address] [place] [address] [phone]              Discharge Instructions    AMB Referral to Advanced Lipid Disorders Clinic   Complete by: As directed    Reason for referral: Follow-up patients for medication management   Provider to see patient: PharmD   Internal Lipid Clinic Referral Scheduling  Internal lipid clinic referrals are providers within [facility], who wish to refer established patients for routine management (help in  starting PCSK9 inhibitor therapy) or advanced therapies.  Internal MD referral criteria:              1. All patients with LDL>190 mg/dL  2. All patients with Triglycerides >500 mg/dL  3. Patients with suspected or confirmed heterozygous familial hyperlipidemia (HeFH) or homozygous familial hyperlipidemia (HoFH)  4. Patients with family history of suspicious for genetic dyslipidemia desiring genetic testing  5. Patients refractory to standard guideline based therapy  6. Patients with statin intolerance (failed 2 statins, one of which must be a high potency statin)  7. Patients who the provider desires to be seen by MD   Internal PharmD referral criteria:   1. Follow-up patients for medication management  2. Follow-up for compliance monitoring  3. Patients for drug education  4. Patients with statin intolerance  5. PCSK9 inhibitor education and prior authorization approvals  6. Patients with triglycerides <500 mg/dL  External Lipid Clinic Referral  External lipid clinic referrals are for providers outside of [facility], considered new clinic patients - automatically routed to MD schedule   AMB Referral to Cardiac Rehabilitation - Phase II   Complete by: As directed    Diagnosis: Coronary Stents   After initial evaluation and assessments completed: Virtual Based Care may be provided alone or in conjunction with Phase 2 Cardiac Rehab based on patient barriers.: Yes   Amb Referral to Cardiac Rehabilitation   Complete by: As directed    To [place]   Diagnosis:  Coronary Stents PTCA     After initial evaluation and assessments completed: Virtual Based Care may be provided alone or in conjunction with Phase 2 Cardiac Rehab based on patient barriers.: Yes   Diet - low sodium heart healthy   Complete by: As directed    Discharge instructions   Complete by: As directed    No driving for 1 week. No lifting over 5 lbs for 1 week. No sexual activity for 1 week.Marland Kitchen Keep procedure site  clean & dry. If you notice increased pain, swelling, bleeding or pus, call/return!  You may shower, but no soaking baths/hot tubs/pools for  1 week.   Increase activity slowly   Complete by: As directed       Discharge Medications   Allergies as of 08/25/2019      Reactions   Codeine Other (See Comments)   unknown   Lisinopril Cough   Contrast Media [iodinated Diagnostic Agents] Hives, Rash      Medication List    TAKE these medications   alfuzosin 10 MG 24 hr tablet Commonly known as: UROXATRAL Take 10 mg by mouth daily.   ALPRAZolam 0.5 MG tablet Commonly known as: XANAX Take 0.5 mg by mouth 2 (two) times daily as needed for anxiety or sleep.   amLODipine 5 MG tablet Commonly known as: NORVASC Take 1 tablet (5 mg total) by mouth at bedtime.   aspirin EC 81 MG tablet Take 81 mg by mouth daily.   b complex vitamins tablet Take 1 tablet by mouth daily.   bethanechol 50 MG tablet Commonly known as: URECHOLINE Take 50 mg by mouth 3 (three) times daily.   carvedilol 6.25 MG tablet Commonly known as: COREG Take 1 tablet by mouth 2 (two) times daily.   clopidogrel 75 MG tablet Commonly known as: PLAVIX TAKE 1 TABLET BY MOUTH ONCE (1) DAILY What changed: See the new instructions.   docusate sodium 100 MG capsule Commonly known as: COLACE Take 1 capsule (100 mg total) by mouth daily.   dorzolamide-timolol 22.3-6.8 MG/ML ophthalmic solution Commonly known as: COSOPT Place 1 drop into both eyes 2 (two) times daily.   ezetimibe 10 MG tablet Commonly known as: ZETIA TAKE 1 TABLET BY MOUTH ONCE (1) DAILY What changed: See the new instructions.   finasteride 5 MG tablet Commonly known as: PROSCAR Take 1 tablet (5 mg total) by mouth daily.   furosemide 20 MG tablet Commonly known as: LASIX Take 20 mg by mouth daily.   isosorbide mononitrate 30 MG 24 hr tablet Commonly known as: IMDUR Take 3 tablets (90 mg total) by mouth daily. What changed:   medication  strength  how much to take   latanoprost 0.005 % ophthalmic solution Commonly known as: XALATAN Place 1 drop into both eyes every evening.   linaclotide 290 MCG Caps capsule Commonly known as: LINZESS Take 290 mcg by mouth daily before breakfast.   Melatonin 12 MG Tabs Take 12 mg by mouth at bedtime.   multivitamin with minerals tablet Take 1 tablet by mouth daily.   nitroGLYCERIN 0.4 MG SL tablet Commonly known as: NITROSTAT DISSOLVE 1 TABLET UNDER THE TONGUE EVERY 5 MINUTES AS NEEDED FOR CHEST PAIN. (MAXIMUM OF 3 DOSES)   ondansetron 8 MG tablet Commonly known as: ZOFRAN Take 8 mg by mouth 2 (two) times daily as needed for nausea or vomiting.   oxybutynin 10 MG 24 hr tablet Commonly known as: DITROPAN-XL Take 10 mg by mouth daily.   pantoprazole 40 MG tablet Commonly known as: PROTONIX Take 1 tablet (40 mg total) by mouth 2 (two) times daily.   Ranexa 1000 MG SR tablet Generic drug: ranolazine Take 1,000 mg by mouth 2 (two) times daily.   traMADol 50 MG tablet Commonly known as: ULTRAM Take 50 mg by mouth 2 (two) times daily as needed for moderate pain.   vitamin C 1000 MG tablet Take 1,000 mg by mouth in the morning, at noon, and at bedtime.   Vitamin D-1000 Max St 25 MCG (1000 UT) tablet Generic drug: Cholecalciferol Take 1,000 Units by mouth 3 (three) times daily.  Outstanding Labs/Studies     Duration of Discharge Encounter   Greater than 30 minutes including physician time.  Signed, Roe Rutherford Jhonatan Lomeli, PA 08/25/2019, 10:57 AM

## 2019-08-25 NOTE — Plan of Care (Signed)
Problem: Education: °Goal: Knowledge of General Education information will improve °Description: Including pain rating scale, medication(s)/side effects and non-pharmacologic comfort measures °Outcome: Completed/Met °  °Problem: Health Behavior/Discharge Planning: °Goal: Ability to manage health-related needs will improve °Outcome: Completed/Met °  °Problem: Clinical Measurements: °Goal: Ability to maintain clinical measurements within normal limits will improve °Outcome: Completed/Met °Goal: Will remain free from infection °Outcome: Completed/Met °Goal: Diagnostic test results will improve °Outcome: Completed/Met °Goal: Respiratory complications will improve °Outcome: Completed/Met °Goal: Cardiovascular complication will be avoided °Outcome: Completed/Met °  °Problem: Activity: °Goal: Risk for activity intolerance will decrease °Outcome: Adequate for Discharge °  °Problem: Nutrition: °Goal: Adequate nutrition will be maintained °Outcome: Completed/Met °  °Problem: Coping: °Goal: Level of anxiety will decrease °Outcome: Completed/Met °  °Problem: Elimination: °Goal: Will not experience complications related to bowel motility °Outcome: Completed/Met °Goal: Will not experience complications related to urinary retention °Outcome: Adequate for Discharge °  °Problem: Pain Managment: °Goal: General experience of comfort will improve °Outcome: Completed/Met °  °Problem: Safety: °Goal: Ability to remain free from injury will improve °Outcome: Completed/Met °  °Problem: Skin Integrity: °Goal: Risk for impaired skin integrity will decrease °Outcome: Completed/Met °  °Problem: Education: °Goal: Understanding of CV disease, CV risk reduction, and recovery process will improve °Outcome: Completed/Met °Goal: Individualized Educational Video(s) °Outcome: Adequate for Discharge °  °Problem: Activity: °Goal: Ability to return to baseline activity level will improve °Outcome: Adequate for Discharge °  °Problem:  Cardiovascular: °Goal: Ability to achieve and maintain adequate cardiovascular perfusion will improve °Outcome: Completed/Met °Goal: Vascular access site(s) Level 0-1 will be maintained °Outcome: Completed/Met °  °Problem: Health Behavior/Discharge Planning: °Goal: Ability to safely manage health-related needs after discharge will improve °Outcome: Completed/Met °  °Problem: Education: °Goal: Ability to demonstrate management of disease process will improve °Outcome: Completed/Met °Goal: Ability to verbalize understanding of medication therapies will improve °Outcome: Completed/Met °Goal: Individualized Educational Video(s) °Outcome: Completed/Met °  °Problem: Activity: °Goal: Capacity to carry out activities will improve °Outcome: Adequate for Discharge °  °Problem: Cardiac: °Goal: Ability to achieve and maintain adequate cardiopulmonary perfusion will improve °Outcome: Completed/Met °  °

## 2019-08-30 ENCOUNTER — Telehealth (HOSPITAL_COMMUNITY): Payer: Self-pay

## 2019-08-30 NOTE — Telephone Encounter (Signed)
Faxed referral for Phase II cardiac rehab to [place]. °

## 2019-08-30 NOTE — Telephone Encounter (Signed)
Patient contacted regarding discharge from [facility] on 08/25/19. Patient understands to follow up with provider Revankar on 09/13/19 at 2:40 at [facility].. Patient understands discharge instructions? yes Patient understands medications and regiment? yes Patient understands to bring all medications to this visit? yes  Ask patient:  Are you enrolled in My Chart no  If no ask patient if they would like to enroll.   For surgery related questions staff will route a phone note to CV DIV TCTS [facility] pool  [facility] [address] [place], [place] [address]  For nonsurgery patients please delete the surgery questions.  For patients n

## 2019-08-31 ENCOUNTER — Other Ambulatory Visit: Payer: Self-pay

## 2019-08-31 DIAGNOSIS — K279 Peptic ulcer, site unspecified, unspecified as acute or chronic, without hemorrhage or perforation: Secondary | ICD-10-CM | POA: Insufficient documentation

## 2019-08-31 DIAGNOSIS — H409 Unspecified glaucoma: Secondary | ICD-10-CM | POA: Insufficient documentation

## 2019-08-31 DIAGNOSIS — H269 Unspecified cataract: Secondary | ICD-10-CM | POA: Insufficient documentation

## 2019-08-31 DIAGNOSIS — E785 Hyperlipidemia, unspecified: Secondary | ICD-10-CM | POA: Insufficient documentation

## 2019-08-31 DIAGNOSIS — I2510 Atherosclerotic heart disease of native coronary artery without angina pectoris: Secondary | ICD-10-CM | POA: Insufficient documentation

## 2019-08-31 DIAGNOSIS — J189 Pneumonia, unspecified organism: Secondary | ICD-10-CM | POA: Insufficient documentation

## 2019-08-31 DIAGNOSIS — M75100 Unspecified rotator cuff tear or rupture of unspecified shoulder, not specified as traumatic: Secondary | ICD-10-CM | POA: Insufficient documentation

## 2019-08-31 DIAGNOSIS — M1990 Unspecified osteoarthritis, unspecified site: Secondary | ICD-10-CM | POA: Insufficient documentation

## 2019-08-31 DIAGNOSIS — K219 Gastro-esophageal reflux disease without esophagitis: Secondary | ICD-10-CM | POA: Insufficient documentation

## 2019-08-31 DIAGNOSIS — I10 Essential (primary) hypertension: Secondary | ICD-10-CM | POA: Insufficient documentation

## 2019-08-31 DIAGNOSIS — N400 Enlarged prostate without lower urinary tract symptoms: Secondary | ICD-10-CM | POA: Insufficient documentation

## 2019-09-06 ENCOUNTER — Ambulatory Visit: Payer: Medicare HMO | Admitting: Cardiology

## 2019-09-13 ENCOUNTER — Ambulatory Visit (INDEPENDENT_AMBULATORY_CARE_PROVIDER_SITE_OTHER): Payer: Medicare HMO | Admitting: Cardiology

## 2019-09-13 ENCOUNTER — Encounter: Payer: Self-pay | Admitting: Cardiology

## 2019-09-13 ENCOUNTER — Other Ambulatory Visit: Payer: Self-pay

## 2019-09-13 VITALS — BP 140/70 | HR 60 | Ht 70.0 in | Wt 173.0 lb

## 2019-09-13 DIAGNOSIS — I25810 Atherosclerosis of coronary artery bypass graft(s) without angina pectoris: Secondary | ICD-10-CM

## 2019-09-13 DIAGNOSIS — E782 Mixed hyperlipidemia: Secondary | ICD-10-CM | POA: Diagnosis not present

## 2019-09-13 DIAGNOSIS — I10 Essential (primary) hypertension: Secondary | ICD-10-CM | POA: Diagnosis not present

## 2019-09-13 NOTE — Patient Instructions (Signed)
Medication Instructions:  Your physician has recommended you make the following change in your medication:   Stop Amlodipine.  *If you need a refill on your cardiac medications before your next appointment, please call your pharmacy*   Lab Work: Your physician recommends that you return for lab work in: Before your next visit.  You need to have labs done when you are fasting.  You can come Monday through Friday 8:30 am to 12:00 pm and 1:15 to 4:30. You do not need to make an appointment as the order has already been placed. The labs you are going to have done are BMET, LFT and Lipids.  If you have labs (blood work) drawn today and your tests are completely normal, you will receive your results only by: Marland Kitchen MyChart Message (if you have MyChart) OR . A paper copy in the mail If you have any lab test that is abnormal or we need to change your treatment, we will call you to review the results.   Testing/Procedures: None ordered   Follow-Up: At [facility], you and your health needs are our priority.  As part of our continuing mission to provide you with exceptional heart care, we have created designated Provider Care Teams.  These Care Teams include your primary Cardiologist (physician) and Advanced Practice Providers (APPs -  Physician Assistants and Nurse Practitioners) who all work together to provide you with the care you need, when you need it.  We recommend signing up for the patient portal called "MyChart".  Sign up information is provided on this After Visit Summary.  MyChart is used to connect with patients for Virtual Visits (Telemedicine).  Patients are able to view lab/test results, encounter notes, upcoming appointments, etc.  Non-urgent messages can be sent to your provider as well.   To learn more about what you can do with MyChart, go to [email or web address].    Your next appointment:   2 month(s)  The format for your next appointment:   In Person  Provider:    Belva Crome, MD   Other Instructions NA

## 2019-09-13 NOTE — Progress Notes (Signed)
Cardiology Office Note:    Date:  09/13/2019   ID:  GRIFFON HERBERG, DOB 04/01/1940, MRN [id number]  PCP:  Galvin Proffer, MD  Cardiologist:  Garwin Brothers, MD   Referring MD: Galvin Proffer, MD    ASSESSMENT:    1. Essential hypertension   2. Mixed hyperlipidemia   3. Coronary artery disease involving coronary bypass graft of native heart without angina pectoris    PLAN:    In order of problems listed above:  1. Coronary artery disease: Secondary prevention stressed with the patient.  Importance of compliance with diet medication stressed and he vocalized understanding. 2. Essential hypertension: Blood pressure stable.  He has borderline low blood pressure therefore I have stopped his amlodipine.  I fear hypotension and fall and such adverse consequences 3. Mixed dyslipidemia: Diet was emphasized.  He is on appropriate medications.  He will be seen in follow-up appointment in 3 months or earlier if he has any concerns.  Before that he will have lipids checked in.  I reviewed recent lipid check and it was fine. 4. Patient had multiple questions which were answered to satisfaction.   Medication Adjustments/Labs and Tests Ordered: Current medicines are reviewed at length with the patient today.  Concerns regarding medicines are outlined above.  No orders of the defined types were placed in this encounter.  No orders of the defined types were placed in this encounter.    No chief complaint on file.    History of Present Illness:    ROMIR KLIMOWICZ is a 79 y.o. male.  Patient has history of coronary artery disease.  He is an active gentleman.  He recently went to the hospital and underwent coronary angiography and intervention.  Complete details of that procedure are made patient below.  Patient currently denies any chest pain orthopnea or PND.  He is active and takes his medications regularly.  He mentions to me that he has low blood pressures in the morning in the 90s.   Again he feels a little tired no dizziness or syncope.  At the time of my evaluation, the patient is alert awake oriented and in no distress.  Past Medical History:  Diagnosis Date  . Abnormal myocardial perfusion study 04/01/2015   Formatting of this note might be different from the original. LCF ischemia-known CTO, EF 30%  . AKI (acute kidney injury) (HCC) 02/19/2018  . Alcohol abuse 08/22/2017  . Anxiety   . Arthritis   . BPH (benign prostatic hyperplasia)   . CAD (coronary artery disease)   . CAD in native artery 06/07/2014   Formatting of this note might be different from the original. Recent [facility] admission 06/02/2014 with abnormal stress echo with inferolateral ischemia at 4.6 Mets and gray zone troponin  0.12  . Cataracts, bilateral   . Chest pain 05/23/2018  . Chest pain at rest 09/29/2017  . Chronic diastolic CHF (congestive heart failure) (HCC) 01/31/2018  . CKD (chronic kidney disease) stage 3, GFR 30-59 ml/min 04/08/2019  . Coronary artery disease involving coronary bypass graft of native heart without angina pectoris 06/17/2014   Cardiac cath 06/18/14: Conclusions Diagnostic Summary 1. Severe Native CAD 2. SVG to Diag occluded 3. SVG to Cir occluded 4. SVG to R-PDA 95% mid body lesion 5. LIMA to LAD patent 6. LVEF is normal Diagnostic Recommendations PCI to SVG-RCA Interventional Summary Successful PCI / Xience Drug Eluting Stent of the mid SVG-RCA graft Interventional Recommendations  Cardiac cath 07/20/14: Conclusions PC  .  Enlarged prostate   . Essential hypertension 03/24/2018  . Gallbladder sludge 04/01/2015  . Gastroesophageal reflux disease without esophagitis 07/21/2017  . GERD (gastroesophageal reflux disease)   . Glaucoma   . 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 Hypercholesteremia 07/21/2017  . Hyperglycemia 10/23/2018  . Hyperlipidemia   . Hypertension   . 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 Nausea & vomiting 04/08/2019  . Non-ST elevation (NSTEMI) myocardial infarction  (HCC) 08/21/2017  . Normocytic anemia, not due to blood loss 02/19/2018  . Orthostatic hypotension 04/10/2019  . Pericarditis 02/03/2018  . Pneumonia   . Pre-operative clearance 09/08/2017  . Preoperative cardiovascular examination 09/17/2018  . PUD (peptic ulcer disease)   . 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 Rotator cuff tear   . Status post coronary artery stent placement   . Upper GI bleed 02/19/2018  . UTI (urinary tract infection) 03/2019    Past Surgical History:  Procedure Laterality Date  . APPENDECTOMY    . COLONOSCOPY WITH PROPOFOL N/A 02/21/2018   Procedure: COLONOSCOPY WITH PROPOFOL;  Surgeon: Sherrilyn Rist, MD;  Location: [facility] ENDOSCOPY;  Service: Gastroenterology;  Laterality: N/A;  . CORONARY ANGIOPLASTY WITH STENT PLACEMENT  11/2014   PCI of SVG to PDA x 2 with Xience DES to proximal stenosis, Ultra 5.0 BMS to distal thrombotic lesion  . CORONARY ARTERY BYPASS GRAFT  2008  . CORONARY ATHERECTOMY N/A 02/01/2018   Procedure: CORONARY ATHERECTOMY;  Surgeon: Yvonne Kendall, MD;  Location: [facility] INVASIVE CV LAB;  Service: Cardiovascular;  Laterality: N/A;  . CORONARY ATHERECTOMY N/A 08/24/2019   Procedure: CORONARY ATHERECTOMY;  Surgeon: Corky Crafts, MD;  Location[facility] INVASIVE CV LAB;  Service: Cardiovascular;  Laterality: N/A;  . CORONARY STENT INTERVENTION N/A 02/01/2018   Procedure: CORONARY STENT INTERVENTION;  Surgeon: Yvonne Kendall, MD;  Location: [facility] INVASIVE CV LAB;  Service: Cardiovascular;  Laterality: N/A;  . CORONARY STENT INTERVENTION N/A 08/22/2019   Procedure: CORONARY STENT INTERVENTION;  Surgeon: Yvonne Kendall, MD;  Location: [facility] INVASIVE CV LAB;  Service: Cardiovascular;  Laterality: N/A;  . CORONARY STENT INTERVENTION N/A 08/24/2019   Procedure: CORONARY STENT INTERVENTION;  Surgeon: Corky Crafts, MD;  Location: [facility] INVASIVE CV LAB;  Service: Cardiovascular;  Laterality: N/A;  . ESOPHAGOGASTRODUODENOSCOPY (EGD) WITH PROPOFOL N/A 02/20/2018   Procedure:  ESOPHAGOGASTRODUODENOSCOPY (EGD) WITH PROPOFOL;  Surgeon: Sherrilyn Rist, MD;  Location: [facility] ENDOSCOPY;  Service: Gastroenterology;  Laterality: N/A;  . EYE SURGERY    . INTRAVASCULAR LITHOTRIPSY  08/24/2019   Procedure: INTRAVASCULAR LITHOTRIPSY;  Surgeon: Corky Crafts, MD;  Location: [facility] INVASIVE CV LAB;  Service: Cardiovascular;;  . INTRAVASCULAR ULTRASOUND/IVUS N/A 02/01/2018   Procedure: Intravascular Ultrasound/IVUS;  Surgeon: Yvonne Kendall, MD;  Location: [facility] INVASIVE CV LAB;  Service: Cardiovascular;  Laterality: N/A;  . INTRAVASCULAR ULTRASOUND/IVUS N/A 08/24/2019   Procedure: Intravascular Ultrasound/IVUS;  Surgeon: Corky Crafts, MD;  Location: [facility] INVASIVE CV LAB;  Service: Cardiovascular;  Laterality: N/A;  . LEFT HEART CATH AND CORS/GRAFTS ANGIOGRAPHY N/A 08/21/2017   Procedure: LEFT HEART CATH AND CORS/GRAFTS ANGIOGRAPHY;  Surgeon: Yvonne Kendall, MD;  Location: [facility] INVASIVE CV LAB;  Service: Cardiovascular;  Laterality: N/A;  . LEFT HEART CATH AND CORS/GRAFTS ANGIOGRAPHY N/A 02/01/2018   Procedure: LEFT HEART CATH AND CORS/GRAFTS ANGIOGRAPHY;  Surgeon: Yvonne Kendall, MD;  Location: [facility] INVASIVE CV LAB;  Service: Cardiovascular;  Laterality: N/A;  . LEFT HEART CATH AND CORS/GRAFTS ANGIOGRAPHY N/A 05/24/2018   Procedure: LEFT HEART CATH AND CORS/GRAFTS ANGIOGRAPHY;  Surgeon: Allyson Sabal,  Delton See, MD;  Location: [facility] INVASIVE CV LAB;  Service: Cardiovascular;  Laterality: N/A;  . LEFT HEART CATH AND CORS/GRAFTS ANGIOGRAPHY N/A 08/22/2019   Procedure: LEFT HEART CATH AND CORS/GRAFTS ANGIOGRAPHY;  Surgeon: Yvonne Kendall, MD;  Location: [facility] INVASIVE CV LAB;  Service: Cardiovascular;  Laterality: N/A;  . LEG SURGERY    . TONSILLECTOMY      Current Medications: Current Meds  Medication Sig  . alfuzosin (UROXATRAL) 10 MG 24 hr tablet Take 10 mg by mouth daily.   Marland Kitchen ALPRAZolam (XANAX) 0.5 MG tablet Take 0.5 mg by mouth 2 (two) times daily as needed for anxiety or sleep.   Marland Kitchen  amLODipine (NORVASC) 5 MG tablet Take 1 tablet (5 mg total) by mouth at bedtime.  . Ascorbic Acid (VITAMIN C) 1000 MG tablet Take 1,000 mg by mouth in the morning, at noon, and at bedtime.  Marland Kitchen aspirin EC 81 MG tablet Take 81 mg by mouth daily.  Marland Kitchen b complex vitamins tablet Take 1 tablet by mouth daily.  . bethanechol (URECHOLINE) 50 MG tablet Take 50 mg by mouth 3 (three) times daily.   . carvedilol (COREG) 6.25 MG tablet Take 1 tablet by mouth 2 (two) times daily.  . Cholecalciferol (VITAMIN D-1000 MAX ST) 25 MCG (1000 UT) tablet Take 1,000 Units by mouth 3 (three) times daily.   . clopidogrel (PLAVIX) 75 MG tablet TAKE 1 TABLET BY MOUTH ONCE (1) DAILY  . docusate sodium (COLACE) 100 MG capsule Take 1 capsule (100 mg total) by mouth daily.  . dorzolamide-timolol (COSOPT) 22.3-6.8 MG/ML ophthalmic solution Place 1 drop into both eyes 2 (two) times daily.  Marland Kitchen ezetimibe (ZETIA) 10 MG tablet TAKE 1 TABLET BY MOUTH ONCE (1) DAILY (Patient taking differently: TAKE 1 TABLET BY MOUTH ONCE (1) DAILY)  . finasteride (PROSCAR) 5 MG tablet Take 1 tablet (5 mg total) by mouth daily.  . furosemide (LASIX) 20 MG tablet Take 20 mg by mouth daily.   . isosorbide mononitrate (IMDUR) 60 MG 24 hr tablet Take 60 mg by mouth daily.  Marland Kitchen latanoprost (XALATAN) 0.005 % ophthalmic solution Place 1 drop into both eyes every evening.   . linaclotide (LINZESS) 290 MCG CAPS capsule Take 290 mcg by mouth daily before breakfast.  . Melatonin 12 MG TABS Take 12 mg by mouth at bedtime.   . methenamine (HIPREX) 1 g tablet Take 1 g by mouth daily.  . Multiple Vitamins-Minerals (MULTIVITAMIN WITH MINERALS) tablet Take 1 tablet by mouth daily.  . nitroGLYCERIN (NITROSTAT) 0.4 MG SL tablet DISSOLVE 1 TABLET UNDER THE TONGUE EVERY 5 MINUTES AS NEEDED FOR CHEST PAIN. (MAXIMUM OF 3 DOSES)  . ondansetron (ZOFRAN) 8 MG tablet Take 8 mg by mouth 2 (two) times daily as needed for nausea or vomiting.   Marland Kitchen oxybutynin (DITROPAN-XL) 10 MG 24 hr  tablet Take 10 mg by mouth daily.  . pantoprazole (PROTONIX) 40 MG tablet Take 1 tablet (40 mg total) by mouth 2 (two) times daily.  . ranolazine (RANEXA) 1000 MG SR tablet Take 1,000 mg by mouth 2 (two) times daily.   . traMADol (ULTRAM) 50 MG tablet Take 50 mg by mouth 2 (two) times daily as needed for moderate pain.      Allergies:   Codeine, Lisinopril, and Contrast media [iodinated diagnostic agents]   Social History   Socioeconomic History  . Marital status: Widowed    Spouse name: Not on file  . Number of children: Not on file  . Years of education:  Not on file  . Highest education level: Not on file  Occupational History  . Occupation: Retired  Tobacco Use  . Smoking status: Former Games developer  . Smokeless tobacco: Never Used  Vaping Use  . Vaping Use: Never used  Substance and Sexual Activity  . Alcohol use: Yes    Alcohol/week: 21.0 standard drinks    Types: 21 Cans of beer per week  . Drug use: Yes    Types: Marijuana  . Sexual activity: Not on file  Other Topics Concern  . Not on file  Social History Narrative   He lives in [place], [place] alone.   Social Determinants of Health   Financial Resource Strain:   . Difficulty of Paying Living Expenses: Not on file  Food Insecurity:   . Worried About Programme researcher, broadcasting/film/video in the Last Year: Not on file  . Ran Out of Food in the Last Year: Not on file  Transportation Needs:   . Lack of Transportation (Medical): Not on file  . Lack of Transportation (Non-Medical): Not on file  Physical Activity:   . Days of Exercise per Week: Not on file  . Minutes of Exercise per Session: Not on file  Stress:   . Feeling of Stress : Not on file  Social Connections:   . Frequency of Communication with Friends and Family: Not on file  . Frequency of Social Gatherings with Friends and Family: Not on file  . Attends Religious Services: Not on file  . Active Member of Clubs or Organizations: Not on file  . Attends Tax inspector Meetings: Not on file  . Marital Status: Not on file     Family History: The patient's family history includes Diabetes in his mother; Heart attack in his brother.  ROS:   Please see the history of present illness.    All other systems reviewed and are negative.  EKGs/Labs/Other Studies Reviewed:    The following studies were reviewed today: Panel Physicians Referring Physician Case Authorizing Physician  Corky Crafts, MD (Primary)    Procedures  INTRAVASCULAR LITHOTRIPSY  CORONARY STENT INTERVENTION  CORONARY ATHERECTOMY  Intravascular Ultrasound/IVUS  Conclusion    SVG to RCA graft is patent.  Ost LM to Mid LM lesion is 99% stenosed. Treated with atherectomy, shockwave intravascular lithotripsy and then A drug-eluting stent was successfully placed using a STENT RESOLUTE ONYX 3.5X12.  Post intervention, there is a 0% residual stenosis.  Ost LAD to Prox LAD lesion is 70% stenosed. Treated with atherectomy, shockwave intravascular lithotripsy and then A drug-eluting stent was successfully placed using a STENT RESOLUTE ONYX 3.0X18.  Post intervention, there is a 0% residual stenosis.  A drug-eluting stent was successfully placed using a STENT RESOLUTE ONYX 3.5X12.  A drug-eluting stent was successfully placed using a STENT RESOLUTE ONYX 3.0X18.   Continue aggressive secondary prevention.  Consider clopidogrel monotherapy after DAPT course is complete.     Recent Labs: 08/22/2019: ALT 15; B Natriuretic Peptide 471.0; Magnesium 2.0; TSH 1.482 08/25/2019: BUN 18; Creatinine, Ser 0.98; Hemoglobin 10.3; Platelets 183; Potassium 4.3; Sodium 136  Recent Lipid Panel    Component Value Date/Time   CHOL 172 08/23/2019 0420   CHOL 184 09/18/2017 1442   TRIG 52 08/23/2019 0420   HDL 76 08/23/2019 0420   HDL 71 09/18/2017 1442   CHOLHDL 2.3 08/23/2019 0420   VLDL 10 08/23/2019 0420   LDLCALC 86 08/23/2019 0420   LDLCALC 92 09/18/2017 1442    Physical  Exam:    VS:  BP 140/70 (BP Location: Right Arm, Patient Position: Sitting, Cuff Size: Normal)   Pulse 60   Ht 5\' 10"  (1.778 m)   Wt 173 lb (78.5 kg)   SpO2 98%   BMI 24.82 kg/m     Wt Readings from Last 3 Encounters:  09/13/19 173 lb (78.5 kg)  08/24/19 166 lb (75.3 kg)  05/25/19 177 lb (80.3 kg)     GEN: Patient is in no acute distress HEENT: Normal NECK: No JVD; No carotid bruits LYMPHATICS: No lymphadenopathy CARDIAC: Hear sounds regular, 2/6 systolic murmur at the apex. RESPIRATORY:  Clear to auscultation without rales, wheezing or rhonchi  ABDOMEN: Soft, non-tender, non-distended MUSCULOSKELETAL:  No edema; No deformity  SKIN: Warm and dry NEUROLOGIC:  Alert and oriented x 3 PSYCHIATRIC:  Normal affect   Signed, Garwin Brothersajan R Rohini Jaroszewski, MD  09/13/2019 3:19 PM    [facility]

## 2019-09-13 NOTE — Addendum Note (Signed)
Addended by: Eleonore Chiquito on: 09/13/2019 03:35 PM   Modules accepted: Orders

## 2019-09-14 ENCOUNTER — Telehealth: Payer: Self-pay | Admitting: Cardiology

## 2019-09-14 NOTE — Telephone Encounter (Signed)
Just take the 60 mg once daily.  Make sure that is long-acting.

## 2019-09-14 NOTE — Telephone Encounter (Signed)
Pt c/o medication issue:  1. Name of Medication: isosorbide mononitrate (IMDUR) 60 MG 24 hr tablet Isosorbide mononitrate (IMDUR) 30 MG  2. How are you currently taking this medication (dosage and times per day)? Currently taking 60 MG's once daily and 30 MG's 3x daily.  3. Are you having a reaction (difficulty breathing--STAT)? No   4. What is your medication issue? Austin Malone is calling stating Dr. [place] prescribed him 30 MG's 3x a day while in the hospital and he has been taking that as well as 60 MG's daily, but doesn't know which one to continue taking. Please advise.

## 2019-09-14 NOTE — Telephone Encounter (Signed)
Pt given recommendation as per Dr. Tomie China regarding Imdur. Pt verbalized understanding and had no additional questions.

## 2019-11-14 ENCOUNTER — Ambulatory Visit: Payer: Medicare HMO | Admitting: Cardiology

## 2019-11-14 ENCOUNTER — Encounter: Payer: Self-pay | Admitting: Cardiology

## 2019-11-14 ENCOUNTER — Ambulatory Visit (INDEPENDENT_AMBULATORY_CARE_PROVIDER_SITE_OTHER): Payer: Medicare HMO | Admitting: Cardiology

## 2019-11-14 ENCOUNTER — Other Ambulatory Visit: Payer: Self-pay

## 2019-11-14 VITALS — BP 132/66 | HR 60 | Ht 70.0 in | Wt 178.2 lb

## 2019-11-14 DIAGNOSIS — Z955 Presence of coronary angioplasty implant and graft: Secondary | ICD-10-CM

## 2019-11-14 DIAGNOSIS — I10 Essential (primary) hypertension: Secondary | ICD-10-CM

## 2019-11-14 DIAGNOSIS — I25700 Atherosclerosis of coronary artery bypass graft(s), unspecified, with unstable angina pectoris: Secondary | ICD-10-CM

## 2019-11-14 DIAGNOSIS — E7800 Pure hypercholesterolemia, unspecified: Secondary | ICD-10-CM | POA: Diagnosis not present

## 2019-11-14 NOTE — Patient Instructions (Signed)
Medication Instructions:  No medication changes. *If you need a refill on your cardiac medications before your next appointment, please call your pharmacy*   Lab Work: Your physician recommends that you return for lab work in: before your next visit. You need to have labs done when you are fasting.  You can come Monday through Friday 8:30 am to 12:00 pm and 1:15 to 4:30. You do not need to make an appointment as the order has already been placed. The labs you are going to have done are BMET, LFT and Lipids.  If you have labs (blood work) drawn today and your tests are completely normal, you will receive your results only by: . MyChart Message (if you have MyChart) OR . A paper copy in the mail If you have any lab test that is abnormal or we need to change your treatment, we will call you to review the results.   Testing/Procedures: None ordered   Follow-Up: At [facility], you and your health needs are our priority.  As part of our continuing mission to provide you with exceptional heart care, we have created designated Provider Care Teams.  These Care Teams include your primary Cardiologist (physician) and Advanced Practice Providers (APPs -  Physician Assistants and Nurse Practitioners) who all work together to provide you with the care you need, when you need it.  We recommend signing up for the patient portal called "MyChart".  Sign up information is provided on this After Visit Summary.  MyChart is used to connect with patients for Virtual Visits (Telemedicine).  Patients are able to view lab/test results, encounter notes, upcoming appointments, etc.  Non-urgent messages can be sent to your provider as well.   To learn more about what you can do with MyChart, go to [email or web address].    Your next appointment:   4 month(s)  The format for your next appointment:   In Person  Provider:   Rajan Revankar, MD   Other Instructions NA  

## 2019-11-14 NOTE — Progress Notes (Signed)
Cardiology Office Note:    Date:  11/14/2019   ID:  Austin Malone, DOB Aug 23, 1940, MRN [id number]  PCP:  Galvin Proffer, MD  Cardiologist:  Garwin Brothers, MD   Referring MD: Galvin Proffer, MD    ASSESSMENT:    1. Coronary artery disease involving coronary bypass graft of native heart with unstable angina pectoris (HCC)   2. Essential hypertension   3. Hypercholesteremia   4. Status post coronary artery stent placement    PLAN:    In order of problems listed above:  1. Coronary artery disease: Secondary prevention stressed with the patient.  Importance of compliance with diet medication stressed any vocalized understanding.  He walks on a regular basis and happy with his secondary prevention and lifestyle. 2. Essential hypertension: Blood pressure stable and diet was emphasized. 3. Stable angina pectoris: Medical management at this time.  He uses nitroglycerin occasionally on a as needed basis. 4. Mixed dyslipidemia: Lipids were reviewed from [facility] sheet and found to be unremarkable.  Diet was emphasized. 5. Patient will be seen in follow-up appointment in 4 months or earlier if the patient has any concerns.  He will be back a few days before his appointment for blood work.   Medication Adjustments/Labs and Tests Ordered: Current medicines are reviewed at length with the patient today.  Concerns regarding medicines are outlined above.  No orders of the defined types were placed in this encounter.  No orders of the defined types were placed in this encounter.    No chief complaint on file.    History of Present Illness:    Austin Malone is a 79 y.o. male.  Patient has known coronary artery disease, essential hypertension dyslipidemia.  He underwent coronary stenting a few months ago.  Subsequently is done fine.  He occasionally has chest pain relieved with nitroglycerin.  He walks half an hour at a regular basis.  At the time of my evaluation, the patient is alert  awake oriented and in no distress.  Past Medical History:  Diagnosis Date  . Abnormal myocardial perfusion study 04/01/2015   Formatting of this note might be different from the original. LCF ischemia-known CTO, EF 30%  . AKI (acute kidney injury) (HCC) 02/19/2018  . Alcohol abuse 08/22/2017  . Anxiety   . Arthritis   . BPH (benign prostatic hyperplasia)   . CAD (coronary artery disease)   . CAD in native artery 06/07/2014   Formatting of this note might be different from the original. Recent [facility] admission 06/02/2014 with abnormal stress echo with inferolateral ischemia at 4.6 Mets and gray zone troponin  0.12  . Cataracts, bilateral   . Chest pain 05/23/2018  . Chest pain at rest 09/29/2017  . Chronic diastolic CHF (congestive heart failure) (HCC) 01/31/2018  . CKD (chronic kidney disease) stage 3, GFR 30-59 ml/min (HCC) 04/08/2019  . Coronary artery disease involving coronary bypass graft of native heart without angina pectoris 06/17/2014   Cardiac cath 06/18/14: Conclusions Diagnostic Summary 1. Severe Native CAD 2. SVG to Diag occluded 3. SVG to Cir occluded 4. SVG to R-PDA 95% mid body lesion 5. LIMA to LAD patent 6. LVEF is normal Diagnostic Recommendations PCI to SVG-RCA Interventional Summary Successful PCI / Xience Drug Eluting Stent of the mid SVG-RCA graft Interventional Recommendations  Cardiac cath 07/20/14: Conclusions PC  . Enlarged prostate   . Essential hypertension 03/24/2018  . Gallbladder sludge 04/01/2015  . Gastroesophageal reflux disease without esophagitis 07/21/2017  .  GERD (gastroesophageal reflux disease)   . Glaucoma   . 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 Hypercholesteremia 07/21/2017  . Hyperglycemia 10/23/2018  . Hyperlipidemia   . Hypertension   . 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 Nausea & vomiting 04/08/2019  . Non-ST elevation (NSTEMI) myocardial infarction (HCC) 08/21/2017  . Normocytic anemia, not due to blood loss 02/19/2018  . Orthostatic hypotension  04/10/2019  . Pericarditis 02/03/2018  . Pneumonia   . Pre-operative clearance 09/08/2017  . Preoperative cardiovascular examination 09/17/2018  . PUD (peptic ulcer disease)   . 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 Rotator cuff tear   . Status post coronary artery stent placement   . Upper GI bleed 02/19/2018  . UTI (urinary tract infection) 03/2019    Past Surgical History:  Procedure Laterality Date  . APPENDECTOMY    . COLONOSCOPY WITH PROPOFOL N/A 02/21/2018   Procedure: COLONOSCOPY WITH PROPOFOL;  Surgeon: Sherrilyn Ristanis, Henry L III, MD;  [facility];  Service: Gastroenterology;  Laterality: N/A;  . CORONARY ANGIOPLASTY WITH STENT PLACEMENT  11/2014   PCI of SVG to PDA x 2 with Xience DES to proximal stenosis, Ultra 5.0 BMS to distal thrombotic lesion  . CORONARY ARTERY BYPASS GRAFT  2008  . CORONARY ATHERECTOMY N/A 02/01/2018   Procedure: CORONARY ATHERECTOMY;  Surgeon: Yvonne KendallEnd, Christopher, MD;  Location: [facility] INVASIVE CV LAB;  Service: Cardiovascular;  Laterality: N/A;  . CORONARY ATHERECTOMY N/A 08/24/2019   Procedure: CORONARY ATHERECTOMY;  Surgeon: Corky CraftsVaranasi, Jayadeep S, MD;  [facility];  Service: Cardiovascular;  Laterality: N/A;  . CORONARY STENT INTERVENTION N/A 02/01/2018   Procedure: CORONARY STENT INTERVENTION;  Surgeon: Yvonne KendallEnd, Christopher, MD;  Location: [facility] INVASIVE CV LAB;  Service: Cardiovascular;  Laterality: N/A;  . CORONARY STENT INTERVENTION N/A 08/22/2019   Procedure: CORONARY STENT INTERVENTION;  Surgeon: Yvonne KendallEnd, Christopher, MD;  Location: [facility]AS[facility] CV LAB;  Service: Cardiovascular;  Laterality: N/A;  . CORONARY STENT INTERVENTION N/A 08/24/2019   Procedure: CORONARY STENT INTERVENTION;  Surgeon: Corky CraftsVaranasi, Jayadeep S, MD;  Location: [facility];  Service: Cardiovascular;  Laterality: N/A;  . ESOPHAGOGASTRODUODENOSCOPY (EGD) WITH PROPOFOL N/A 02/20/2018   Procedure: ESOPHAGOGASTRODUODENOSCOPY (EGD) WITH PROPOFOL;  Surgeon: Sherrilyn Ristanis, Henry L III, MD;  [facility];  Service: Gastroenterology;  Laterality: N/A;  . EYE SURGERY    . INTRAVASCULAR LITHOTRIPSY  08/24/2019   Procedure: INTRAVASCULAR LITHOTRIPSY;  Surgeon: Corky CraftsVaranasi, Jayadeep S, MD;  [facility];  Service: Cardiovascular;;  . INTRAVASCULAR ULTRASOUND/IVUS N/A 02/01/2018   Procedure: Intravascular Ultrasound/IVUS;  Surgeon: Yvonne KendallEnd, Christopher, MD;  [facility]: [facility];  Service: Cardiovascular;  Laterality: N/A;  . INTRAVASCULAR ULTRASOUND/IVUS N/A 08/24/2019   Procedure: Intravascular Ultrasound/IVUS;  Surgeon: Corky CraftsVaranasi, Jayadeep S, MD;  [facility];  Service: Cardiovascular;  Laterality: N/A;  . LEFT HEART CATH AND CORS/GRAFTS ANGIOGRAPHY N/A 08/21/2017   Procedure: LEFT HEART CATH AND CORS/GRAFTS ANGIOGRAPHY;  Surgeon: Yvonne KendallEnd, Christopher, MD;  [facility]: [facility];  Service: Cardiovascular;  Laterality: N/A;  . LEFT HEART CATH AND CORS/GRAFTS ANGIOGRAPHY N/A 02/01/2018   Procedure: LEFT HEART CATH AND CORS/GRAFTS ANGIOGRAPHY;  Surgeon: Yvonne KendallEnd, Christopher, MD;  [facility]: [facility];  Service: Cardiovascular;  Laterality: N/A;  . LEFT HEART CATH AND CORS/GRAFTS ANGIOGRAPHY N/A 05/24/2018   Procedure: LEFT HEART CATH AND CORS/GRAFTS ANGIOGRAPHY;  Surgeon: Runell GessBerry, Jonathan J, MD;  Location: [facility];  Service: Cardiovascular;  Laterality: N/A;  . LEFT HEART CATH AND CORS/GRAFTS ANGIOGRAPHY  N/A 08/22/2019   Procedure: LEFT HEART CATH AND CORS/GRAFTS ANGIOGRAPHY;  Surgeon: Yvonne Kendall, MD;  Location: [facility];  Service: Cardiovascular;  Laterality: N/A;  . LEG SURGERY    . TONSILLECTOMY      Current Medications: Current Meds  Medication Sig  . alfuzosin (UROXATRAL) 10 MG 24 hr tablet Take 10 mg by mouth daily.   Marland Kitchen ALPRAZolam (XANAX) 0.5 MG tablet Take 0.5 mg by mouth 2 (two) times daily as needed for anxiety or sleep.   . Ascorbic Acid (VITAMIN C) 1000 MG tablet Take 1,000 mg by mouth in the morning, at noon, and at  bedtime.  Marland Kitchen aspirin EC 81 MG tablet Take 81 mg by mouth daily.  Marland Kitchen b complex vitamins tablet Take 1 tablet by mouth daily.  . bethanechol (URECHOLINE) 50 MG tablet Take 50 mg by mouth 3 (three) times daily.   . carvedilol (COREG) 6.25 MG tablet Take 1 tablet by mouth 2 (two) times daily.  . clopidogrel (PLAVIX) 75 MG tablet TAKE 1 TABLET BY MOUTH ONCE (1) DAILY  . docusate sodium (COLACE) 100 MG capsule Take 1 capsule (100 mg total) by mouth daily.  . dorzolamide-timolol (COSOPT) 22.3-6.8 MG/ML ophthalmic solution Place 1 drop into both eyes 2 (two) times daily.  Marland Kitchen ezetimibe (ZETIA) 10 MG tablet TAKE 1 TABLET BY MOUTH ONCE (1) DAILY  . finasteride (PROSCAR) 5 MG tablet Take 1 tablet (5 mg total) by mouth daily.  . furosemide (LASIX) 20 MG tablet Take 20 mg by mouth daily.   . isosorbide mononitrate (IMDUR) 60 MG 24 hr tablet Take 60 mg by mouth daily.  Marland Kitchen latanoprost (XALATAN) 0.005 % ophthalmic solution Place 1 drop into both eyes every evening.   . linaclotide (LINZESS) 290 MCG CAPS capsule Take 290 mcg by mouth daily before breakfast.  . Melatonin 12 MG TABS Take 10 mg by mouth at bedtime.   . methenamine (HIPREX) 1 g tablet Take 1 g by mouth daily.  . Multiple Vitamins-Minerals (MULTIVITAMIN WITH MINERALS) tablet Take 1 tablet by mouth daily.  . nitroGLYCERIN (NITROSTAT) 0.4 MG SL tablet DISSOLVE 1 TABLET UNDER THE TONGUE EVERY 5 MINUTES AS NEEDED FOR CHEST PAIN. (MAXIMUM OF 3 DOSES)  . ondansetron (ZOFRAN) 8 MG tablet Take 8 mg by mouth 2 (two) times daily as needed for nausea or vomiting.   Marland Kitchen oxybutynin (DITROPAN-XL) 10 MG 24 hr tablet Take 10 mg by mouth daily.  . pantoprazole (PROTONIX) 40 MG tablet Take 1 tablet (40 mg total) by mouth 2 (two) times daily.  . ranolazine (RANEXA) 1000 MG SR tablet Take 1,000 mg by mouth 2 (two) times daily.   . traMADol (ULTRAM) 50 MG tablet Take 50 mg by mouth 2 (two) times daily as needed for moderate pain.      Allergies:   Codeine, Lisinopril, and  Contrast media [iodinated diagnostic agents]   Social History   Socioeconomic History  . Marital status: Widowed    Spouse name: Not on file  . Number of children: Not on file  . Years of education: Not on file  . Highest education level: Not on file  Occupational History  . Occupation: Retired  Tobacco Use  . Smoking status: Former Games developer  . Smokeless tobacco: Never Used  Vaping Use  . Vaping Use: Never used  Substance and Sexual Activity  . Alcohol use: Yes    Alcohol/week: 21.0 standard drinks    Types: 21 Cans of beer per week  . Drug use:  Yes    Types: Marijuana  . Sexual activity: Not on file  Other Topics Concern  . Not on file  Social History Narrative   He lives in [place], [place] alone.   Social Determinants of Health   Financial Resource Strain:   . Difficulty of Paying Living Expenses: Not on file  Food Insecurity:   . Worried About Programme researcher, broadcasting/film/video in the Last Year: Not on file  . Ran Out of Food in the Last Year: Not on file  Transportation Needs:   . Lack of Transportation (Medical): Not on file  . Lack of Transportation (Non-Medical): Not on file  Physical Activity:   . Days of Exercise per Week: Not on file  . Minutes of Exercise per Session: Not on file  Stress:   . Feeling of Stress : Not on file  Social Connections:   . Frequency of Communication with Friends and Family: Not on file  . Frequency of Social Gatherings with Friends and Family: Not on file  . Attends Religious Services: Not on file  . Active Member of Clubs or Organizations: Not on file  . Attends Banker Meetings: Not on file  . Marital Status: Not on file     Family History: The patient's family history includes Diabetes in his mother; Heart attack in his brother.  ROS:   Please see the history of present illness.    All other systems reviewed and are negative.  EKGs/Labs/Other Studies Reviewed:    The following studies were reviewed today: I  discussed my findings with the patient at length including lab work from [facility]   Recent Labs: 08/22/2019: ALT 15; B Natriuretic Peptide 471.0; Magnesium 2.0; TSH 1.482 08/25/2019: BUN 18; Creatinine, Ser 0.98; Hemoglobin 10.3; Platelets 183; Potassium 4.3; Sodium 136  Recent Lipid Panel    Component Value Date/Time   CHOL 172 08/23/2019 0420   CHOL 184 09/18/2017 1442   TRIG 52 08/23/2019 0420   HDL 76 08/23/2019 0420   HDL 71 09/18/2017 1442   CHOLHDL 2.3 08/23/2019 0420   VLDL 10 08/23/2019 0420   LDLCALC 86 08/23/2019 0420   LDLCALC 92 09/18/2017 1442    Physical Exam:    VS:  BP 132/66   Pulse 60   Ht 5\' 10"  (1.778 m)   Wt 178 lb 3.2 oz (80.8 kg)   SpO2 96%   BMI 25.57 kg/m     Wt Readings from Last 3 Encounters:  11/14/19 178 lb 3.2 oz (80.8 kg)  09/13/19 173 lb (78.5 kg)  08/24/19 166 lb (75.3 kg)     GEN: Patient is in no acute distress HEENT: Normal NECK: No JVD; No carotid bruits LYMPHATICS: No lymphadenopathy CARDIAC: Hear sounds regular, 2/6 systolic murmur at the apex. RESPIRATORY:  Clear to auscultation without rales, wheezing or rhonchi  ABDOMEN: Soft, non-tender, non-distended MUSCULOSKELETAL:  No edema; No deformity  SKIN: Warm and dry NEUROLOGIC:  Alert and oriented x 3 PSYCHIATRIC:  Normal affect   Signed, 10/24/19, MD  11/14/2019 3:15 PM    [facility]

## 2019-12-09 ENCOUNTER — Other Ambulatory Visit: Payer: Self-pay | Admitting: Cardiology

## 2020-01-28 ENCOUNTER — Other Ambulatory Visit: Payer: Self-pay | Admitting: Cardiology

## 2020-02-25 ENCOUNTER — Inpatient Hospital Stay (HOSPITAL_COMMUNITY)
Admission: EM | Admit: 2020-02-25 | Discharge: 2020-03-04 | DRG: 698 | Disposition: A | Discharge status: Skilled Nursing Facility | Payer: Medicare HMO | Attending: Internal Medicine | Admitting: Internal Medicine

## 2020-02-25 ENCOUNTER — Other Ambulatory Visit: Payer: Self-pay

## 2020-02-25 ENCOUNTER — Encounter (HOSPITAL_COMMUNITY): Payer: Self-pay | Admitting: Emergency Medicine

## 2020-02-25 DIAGNOSIS — H5461 Unqualified visual loss, right eye, normal vision left eye: Secondary | ICD-10-CM | POA: Diagnosis present

## 2020-02-25 DIAGNOSIS — K581 Irritable bowel syndrome with constipation: Secondary | ICD-10-CM | POA: Diagnosis present

## 2020-02-25 DIAGNOSIS — J189 Pneumonia, unspecified organism: Secondary | ICD-10-CM | POA: Diagnosis not present

## 2020-02-25 DIAGNOSIS — Z87891 Personal history of nicotine dependence: Secondary | ICD-10-CM

## 2020-02-25 DIAGNOSIS — Y846 Urinary catheterization as the cause of abnormal reaction of the patient, or of later complication, without mention of misadventure at the time of the procedure: Secondary | ICD-10-CM | POA: Diagnosis present

## 2020-02-25 DIAGNOSIS — N390 Urinary tract infection, site not specified: Secondary | ICD-10-CM | POA: Diagnosis present

## 2020-02-25 DIAGNOSIS — Z6827 Body mass index (BMI) 27.0-27.9, adult: Secondary | ICD-10-CM

## 2020-02-25 DIAGNOSIS — Z955 Presence of coronary angioplasty implant and graft: Secondary | ICD-10-CM

## 2020-02-25 DIAGNOSIS — E7800 Pure hypercholesterolemia, unspecified: Secondary | ICD-10-CM | POA: Diagnosis present

## 2020-02-25 DIAGNOSIS — E860 Dehydration: Secondary | ICD-10-CM | POA: Diagnosis present

## 2020-02-25 DIAGNOSIS — Z885 Allergy status to narcotic agent status: Secondary | ICD-10-CM

## 2020-02-25 DIAGNOSIS — G894 Chronic pain syndrome: Secondary | ICD-10-CM | POA: Diagnosis present

## 2020-02-25 DIAGNOSIS — T83511A Infection and inflammatory reaction due to indwelling urethral catheter, initial encounter: Principal | ICD-10-CM | POA: Diagnosis present

## 2020-02-25 DIAGNOSIS — Z66 Do not resuscitate: Secondary | ICD-10-CM | POA: Diagnosis present

## 2020-02-25 DIAGNOSIS — I10 Essential (primary) hypertension: Secondary | ICD-10-CM | POA: Diagnosis present

## 2020-02-25 DIAGNOSIS — Z833 Family history of diabetes mellitus: Secondary | ICD-10-CM

## 2020-02-25 DIAGNOSIS — E43 Unspecified severe protein-calorie malnutrition: Secondary | ICD-10-CM | POA: Diagnosis present

## 2020-02-25 DIAGNOSIS — W19XXXA Unspecified fall, initial encounter: Secondary | ICD-10-CM | POA: Diagnosis present

## 2020-02-25 DIAGNOSIS — Z87440 Personal history of urinary (tract) infections: Secondary | ICD-10-CM

## 2020-02-25 DIAGNOSIS — Z888 Allergy status to other drugs, medicaments and biological substances status: Secondary | ICD-10-CM

## 2020-02-25 DIAGNOSIS — Z7982 Long term (current) use of aspirin: Secondary | ICD-10-CM

## 2020-02-25 DIAGNOSIS — E871 Hypo-osmolality and hyponatremia: Secondary | ICD-10-CM | POA: Diagnosis present

## 2020-02-25 DIAGNOSIS — I25810 Atherosclerosis of coronary artery bypass graft(s) without angina pectoris: Secondary | ICD-10-CM | POA: Diagnosis present

## 2020-02-25 DIAGNOSIS — M1990 Unspecified osteoarthritis, unspecified site: Secondary | ICD-10-CM | POA: Diagnosis present

## 2020-02-25 DIAGNOSIS — N1832 Chronic kidney disease, stage 3b: Secondary | ICD-10-CM | POA: Diagnosis present

## 2020-02-25 DIAGNOSIS — S39012A Strain of muscle, fascia and tendon of lower back, initial encounter: Secondary | ICD-10-CM

## 2020-02-25 DIAGNOSIS — Y92009 Unspecified place in unspecified non-institutional (private) residence as the place of occurrence of the external cause: Secondary | ICD-10-CM

## 2020-02-25 DIAGNOSIS — Z79899 Other long term (current) drug therapy: Secondary | ICD-10-CM

## 2020-02-25 DIAGNOSIS — Y738 Miscellaneous gastroenterology and urology devices associated with adverse incidents, not elsewhere classified: Secondary | ICD-10-CM | POA: Diagnosis present

## 2020-02-25 DIAGNOSIS — R0602 Shortness of breath: Secondary | ICD-10-CM

## 2020-02-25 DIAGNOSIS — Z8249 Family history of ischemic heart disease and other diseases of the circulatory system: Secondary | ICD-10-CM

## 2020-02-25 DIAGNOSIS — Z20822 Contact with and (suspected) exposure to covid-19: Secondary | ICD-10-CM | POA: Diagnosis present

## 2020-02-25 DIAGNOSIS — S060X0A Concussion without loss of consciousness, initial encounter: Secondary | ICD-10-CM

## 2020-02-25 DIAGNOSIS — N400 Enlarged prostate without lower urinary tract symptoms: Secondary | ICD-10-CM | POA: Diagnosis present

## 2020-02-25 DIAGNOSIS — E785 Hyperlipidemia, unspecified: Secondary | ICD-10-CM | POA: Diagnosis present

## 2020-02-25 DIAGNOSIS — Z1623 Resistance to quinolones and fluoroquinolones: Secondary | ICD-10-CM | POA: Diagnosis present

## 2020-02-25 DIAGNOSIS — Z7902 Long term (current) use of antithrombotics/antiplatelets: Secondary | ICD-10-CM

## 2020-02-25 DIAGNOSIS — J9601 Acute respiratory failure with hypoxia: Secondary | ICD-10-CM | POA: Diagnosis not present

## 2020-02-25 DIAGNOSIS — F419 Anxiety disorder, unspecified: Secondary | ICD-10-CM | POA: Diagnosis present

## 2020-02-25 DIAGNOSIS — Z91041 Radiographic dye allergy status: Secondary | ICD-10-CM

## 2020-02-25 DIAGNOSIS — B9620 Unspecified Escherichia coli [E. coli] as the cause of diseases classified elsewhere: Secondary | ICD-10-CM | POA: Diagnosis present

## 2020-02-25 DIAGNOSIS — F1010 Alcohol abuse, uncomplicated: Secondary | ICD-10-CM | POA: Diagnosis present

## 2020-02-25 DIAGNOSIS — R531 Weakness: Secondary | ICD-10-CM

## 2020-02-25 DIAGNOSIS — S161XXA Strain of muscle, fascia and tendon at neck level, initial encounter: Secondary | ICD-10-CM

## 2020-02-25 DIAGNOSIS — N183 Chronic kidney disease, stage 3 unspecified: Secondary | ICD-10-CM | POA: Diagnosis present

## 2020-02-25 DIAGNOSIS — I130 Hypertensive heart and chronic kidney disease with heart failure and stage 1 through stage 4 chronic kidney disease, or unspecified chronic kidney disease: Secondary | ICD-10-CM | POA: Diagnosis present

## 2020-02-25 DIAGNOSIS — E861 Hypovolemia: Secondary | ICD-10-CM | POA: Diagnosis present

## 2020-02-25 DIAGNOSIS — H409 Unspecified glaucoma: Secondary | ICD-10-CM | POA: Diagnosis present

## 2020-02-25 DIAGNOSIS — K219 Gastro-esophageal reflux disease without esophagitis: Secondary | ICD-10-CM | POA: Diagnosis present

## 2020-02-25 DIAGNOSIS — I2510 Atherosclerotic heart disease of native coronary artery without angina pectoris: Secondary | ICD-10-CM | POA: Diagnosis present

## 2020-02-25 DIAGNOSIS — I5032 Chronic diastolic (congestive) heart failure: Secondary | ICD-10-CM | POA: Diagnosis present

## 2020-02-25 LAB — BASIC METABOLIC PANEL
Anion gap: 17 — ABNORMAL HIGH (ref 5–15)
BUN: 13 mg/dL (ref 8–23)
CO2: 19 mmol/L — ABNORMAL LOW (ref 22–32)
Calcium: 9.2 mg/dL (ref 8.9–10.3)
Chloride: 91 mmol/L — ABNORMAL LOW (ref 98–111)
Creatinine, Ser: 1 mg/dL (ref 0.61–1.24)
GFR, Estimated: >60 mL/min (ref >60)
Glucose, Bld: 76 mg/dL (ref 70–99)
Potassium: 4 mmol/L (ref 3.5–5.1)
Sodium: 127 mmol/L — ABNORMAL LOW (ref 135–145)

## 2020-02-25 LAB — CBC
HCT: 35.3 % — ABNORMAL LOW (ref 39.0–52.0)
Hemoglobin: 12.3 g/dL — ABNORMAL LOW (ref 13.0–17.0)
MCH: 35 pg — ABNORMAL HIGH (ref 26.0–34.0)
MCHC: 34.8 g/dL (ref 30.0–36.0)
MCV: 100.6 fL — ABNORMAL HIGH (ref 80.0–100.0)
Platelets: 157 10*3/uL (ref 150–400)
RBC: 3.51 MIL/uL — ABNORMAL LOW (ref 4.22–5.81)
RDW: 12 % (ref 11.5–15.5)
WBC: 6.9 10*3/uL (ref 4.0–10.5)
nRBC: 0 % (ref 0.0–0.2)

## 2020-02-25 NOTE — ED Triage Notes (Signed)
Pt to triage via [facility].  Reports fall last night- pt states he believes he was confused due to UTI and has generalized weakness.  States he is taking his 2nd round of antibiotics for UTI.  C/o thoracic back pain and R shoulder pain.  Takes Plavix.

## 2020-02-26 ENCOUNTER — Emergency Department (HOSPITAL_COMMUNITY): Payer: Medicare HMO

## 2020-02-26 ENCOUNTER — Encounter (HOSPITAL_COMMUNITY): Payer: Self-pay | Admitting: Internal Medicine

## 2020-02-26 DIAGNOSIS — E871 Hypo-osmolality and hyponatremia: Secondary | ICD-10-CM | POA: Diagnosis present

## 2020-02-26 DIAGNOSIS — J9601 Acute respiratory failure with hypoxia: Secondary | ICD-10-CM | POA: Diagnosis not present

## 2020-02-26 DIAGNOSIS — Y738 Miscellaneous gastroenterology and urology devices associated with adverse incidents, not elsewhere classified: Secondary | ICD-10-CM | POA: Diagnosis present

## 2020-02-26 DIAGNOSIS — E861 Hypovolemia: Secondary | ICD-10-CM | POA: Diagnosis present

## 2020-02-26 DIAGNOSIS — T83511A Infection and inflammatory reaction due to indwelling urethral catheter, initial encounter: Secondary | ICD-10-CM | POA: Diagnosis present

## 2020-02-26 DIAGNOSIS — Z66 Do not resuscitate: Secondary | ICD-10-CM

## 2020-02-26 DIAGNOSIS — E7800 Pure hypercholesterolemia, unspecified: Secondary | ICD-10-CM | POA: Diagnosis present

## 2020-02-26 DIAGNOSIS — F419 Anxiety disorder, unspecified: Secondary | ICD-10-CM | POA: Diagnosis present

## 2020-02-26 DIAGNOSIS — Z1623 Resistance to quinolones and fluoroquinolones: Secondary | ICD-10-CM | POA: Diagnosis present

## 2020-02-26 DIAGNOSIS — Y846 Urinary catheterization as the cause of abnormal reaction of the patient, or of later complication, without mention of misadventure at the time of the procedure: Secondary | ICD-10-CM | POA: Diagnosis present

## 2020-02-26 DIAGNOSIS — H409 Unspecified glaucoma: Secondary | ICD-10-CM | POA: Diagnosis present

## 2020-02-26 DIAGNOSIS — Z87440 Personal history of urinary (tract) infections: Secondary | ICD-10-CM | POA: Diagnosis not present

## 2020-02-26 DIAGNOSIS — E43 Unspecified severe protein-calorie malnutrition: Secondary | ICD-10-CM | POA: Diagnosis present

## 2020-02-26 DIAGNOSIS — I2510 Atherosclerotic heart disease of native coronary artery without angina pectoris: Secondary | ICD-10-CM | POA: Diagnosis present

## 2020-02-26 DIAGNOSIS — I25810 Atherosclerosis of coronary artery bypass graft(s) without angina pectoris: Secondary | ICD-10-CM | POA: Diagnosis present

## 2020-02-26 DIAGNOSIS — N400 Enlarged prostate without lower urinary tract symptoms: Secondary | ICD-10-CM | POA: Diagnosis present

## 2020-02-26 DIAGNOSIS — H5461 Unqualified visual loss, right eye, normal vision left eye: Secondary | ICD-10-CM | POA: Diagnosis present

## 2020-02-26 DIAGNOSIS — Z20822 Contact with and (suspected) exposure to covid-19: Secondary | ICD-10-CM | POA: Diagnosis present

## 2020-02-26 DIAGNOSIS — K219 Gastro-esophageal reflux disease without esophagitis: Secondary | ICD-10-CM | POA: Diagnosis present

## 2020-02-26 DIAGNOSIS — W19XXXA Unspecified fall, initial encounter: Secondary | ICD-10-CM

## 2020-02-26 DIAGNOSIS — F1010 Alcohol abuse, uncomplicated: Secondary | ICD-10-CM | POA: Diagnosis present

## 2020-02-26 DIAGNOSIS — J189 Pneumonia, unspecified organism: Secondary | ICD-10-CM | POA: Diagnosis not present

## 2020-02-26 DIAGNOSIS — M1990 Unspecified osteoarthritis, unspecified site: Secondary | ICD-10-CM | POA: Diagnosis present

## 2020-02-26 DIAGNOSIS — Y92009 Unspecified place in unspecified non-institutional (private) residence as the place of occurrence of the external cause: Secondary | ICD-10-CM | POA: Diagnosis not present

## 2020-02-26 DIAGNOSIS — E860 Dehydration: Secondary | ICD-10-CM

## 2020-02-26 DIAGNOSIS — I130 Hypertensive heart and chronic kidney disease with heart failure and stage 1 through stage 4 chronic kidney disease, or unspecified chronic kidney disease: Secondary | ICD-10-CM | POA: Diagnosis present

## 2020-02-26 DIAGNOSIS — E785 Hyperlipidemia, unspecified: Secondary | ICD-10-CM | POA: Diagnosis present

## 2020-02-26 DIAGNOSIS — I5032 Chronic diastolic (congestive) heart failure: Secondary | ICD-10-CM | POA: Diagnosis present

## 2020-02-26 HISTORY — DX: Do not resuscitate: Z66

## 2020-02-26 HISTORY — DX: Unspecified fall, initial encounter: W19.XXXA

## 2020-02-26 HISTORY — DX: Hypo-osmolality and hyponatremia: E87.1

## 2020-02-26 HISTORY — DX: Dehydration: E86.0

## 2020-02-26 LAB — COMPREHENSIVE METABOLIC PANEL
ALT: 45 U/L — ABNORMAL HIGH (ref 0–44)
AST: 43 U/L — ABNORMAL HIGH (ref 15–41)
Albumin: 3.3 g/dL — ABNORMAL LOW (ref 3.5–5.0)
Alkaline Phosphatase: 46 U/L (ref 38–126)
Anion gap: 15 (ref 5–15)
BUN: 17 mg/dL (ref 8–23)
CO2: 20 mmol/L — ABNORMAL LOW (ref 22–32)
Calcium: 8.8 mg/dL — ABNORMAL LOW (ref 8.9–10.3)
Chloride: 96 mmol/L — ABNORMAL LOW (ref 98–111)
Creatinine, Ser: 1.17 mg/dL (ref 0.61–1.24)
GFR, Estimated: >60 mL/min (ref >60)
Glucose, Bld: 82 mg/dL (ref 70–99)
Potassium: 4.1 mmol/L (ref 3.5–5.1)
Sodium: 131 mmol/L — ABNORMAL LOW (ref 135–145)
Total Bilirubin: 1.1 mg/dL (ref 0.3–1.2)
Total Protein: 6.1 g/dL — ABNORMAL LOW (ref 6.5–8.1)

## 2020-02-26 LAB — URINALYSIS, ROUTINE W REFLEX MICROSCOPIC
Bilirubin Urine: NEGATIVE
Glucose, UA: NEGATIVE mg/dL
Hgb urine dipstick: NEGATIVE
Ketones, ur: 80 mg/dL — AB
Nitrite: NEGATIVE
Protein, ur: NEGATIVE mg/dL
Specific Gravity, Urine: 1.015 (ref 1.005–1.030)
pH: 5 (ref 5.0–8.0)

## 2020-02-26 LAB — MAGNESIUM: Magnesium: 2.1 mg/dL (ref 1.7–2.4)

## 2020-02-26 LAB — PHOSPHORUS: Phosphorus: 3.5 mg/dL (ref 2.5–4.6)

## 2020-02-26 LAB — SARS CORONAVIRUS 2 (TAT 6-24 HRS): SARS Coronavirus 2: NEGATIVE

## 2020-02-26 LAB — TSH: TSH: 1.804 u[IU]/mL (ref 0.350–4.500)

## 2020-02-26 LAB — CK: Total CK: 46 U/L — ABNORMAL LOW (ref 49–397)

## 2020-02-26 MED ORDER — EZETIMIBE 10 MG PO TABS
10.0000 mg | ORAL_TABLET | Freq: Every day | ORAL | Status: DC
  Administered 2020-02-26 – 2020-03-04 (×7): 10 mg via ORAL
  Filled 2020-02-26 (×8): qty 1

## 2020-02-26 MED ORDER — TRAMADOL HCL 50 MG PO TABS
50.0000 mg | ORAL_TABLET | Freq: Two times a day (BID) | ORAL | Status: DC | PRN
  Administered 2020-03-01: 50 mg via ORAL
  Filled 2020-02-26: qty 1

## 2020-02-26 MED ORDER — ALPRAZOLAM 0.5 MG PO TABS
0.5000 mg | ORAL_TABLET | Freq: Two times a day (BID) | ORAL | Status: DC | PRN
  Administered 2020-02-26 – 2020-03-04 (×13): 0.5 mg via ORAL
  Filled 2020-02-26 (×15): qty 1

## 2020-02-26 MED ORDER — LORAZEPAM 2 MG/ML IJ SOLN: 1.0000 mg | INTRAMUSCULAR | Status: DC | PRN

## 2020-02-26 MED ORDER — LACTATED RINGERS IV SOLN: INTRAVENOUS | Status: DC

## 2020-02-26 MED ORDER — CHLORHEXIDINE GLUCONATE CLOTH 2 % EX PADS
6.0000 | MEDICATED_PAD | Freq: Every day | CUTANEOUS | Status: DC
  Administered 2020-02-26 – 2020-03-04 (×8): 6 via TOPICAL

## 2020-02-26 MED ORDER — POLYETHYLENE GLYCOL 3350 17 G PO PACK: 17.0000 g | PACK | Freq: Every day | ORAL | Status: DC | PRN

## 2020-02-26 MED ORDER — CARVEDILOL 6.25 MG PO TABS
6.2500 mg | ORAL_TABLET | Freq: Two times a day (BID) | ORAL | Status: DC
  Administered 2020-02-26 – 2020-03-04 (×15): 6.25 mg via ORAL
  Filled 2020-02-26 (×14): qty 1

## 2020-02-26 MED ORDER — ONDANSETRON HCL 4 MG/2ML IJ SOLN
4.0000 mg | Freq: Once | INTRAMUSCULAR | Status: AC
  Administered 2020-02-26: 4 mg via INTRAVENOUS
  Filled 2020-02-26: qty 2

## 2020-02-26 MED ORDER — ACETAMINOPHEN 650 MG RE SUPP: 650.0000 mg | Freq: Four times a day (QID) | RECTAL | Status: DC | PRN

## 2020-02-26 MED ORDER — FOLIC ACID 1 MG PO TABS
1.0000 mg | ORAL_TABLET | Freq: Every day | ORAL | Status: DC
  Administered 2020-02-26 – 2020-03-04 (×8): 1 mg via ORAL
  Filled 2020-02-26 (×8): qty 1

## 2020-02-26 MED ORDER — ACETAMINOPHEN 325 MG PO TABS
650.0000 mg | ORAL_TABLET | Freq: Four times a day (QID) | ORAL | Status: DC | PRN
  Administered 2020-03-01 – 2020-03-02 (×2): 650 mg via ORAL
  Filled 2020-02-26 (×2): qty 2

## 2020-02-26 MED ORDER — THIAMINE HCL 100 MG PO TABS
100.0000 mg | ORAL_TABLET | Freq: Every day | ORAL | Status: DC
  Administered 2020-02-26 – 2020-03-04 (×8): 100 mg via ORAL
  Filled 2020-02-26 (×9): qty 1

## 2020-02-26 MED ORDER — HYDRALAZINE HCL 20 MG/ML IJ SOLN
5.0000 mg | INTRAMUSCULAR | Status: DC | PRN
  Administered 2020-02-26 – 2020-03-04 (×4): 5 mg via INTRAVENOUS
  Filled 2020-02-26 (×4): qty 1

## 2020-02-26 MED ORDER — ISOSORBIDE MONONITRATE ER 60 MG PO TB24
60.0000 mg | ORAL_TABLET | Freq: Every day | ORAL | Status: DC
  Administered 2020-02-26 – 2020-03-04 (×8): 60 mg via ORAL
  Filled 2020-02-26 (×4): qty 1
  Filled 2020-02-26: qty 2
  Filled 2020-02-26 (×3): qty 1

## 2020-02-26 MED ORDER — ONDANSETRON HCL 4 MG PO TABS
4.0000 mg | ORAL_TABLET | Freq: Four times a day (QID) | ORAL | Status: DC | PRN
  Administered 2020-02-26: 4 mg via ORAL
  Filled 2020-02-26: qty 1

## 2020-02-26 MED ORDER — HYDROCODONE-ACETAMINOPHEN 5-325 MG PO TABS
1.0000 | ORAL_TABLET | ORAL | Status: DC | PRN
  Administered 2020-02-26: 2 via ORAL
  Administered 2020-02-26: 1 via ORAL
  Administered 2020-02-26: 2 via ORAL
  Filled 2020-02-26: qty 1
  Filled 2020-02-26 (×2): qty 2

## 2020-02-26 MED ORDER — THIAMINE HCL 100 MG/ML IJ SOLN
100.0000 mg | Freq: Every day | INTRAMUSCULAR | Status: DC
  Filled 2020-02-26: qty 2

## 2020-02-26 MED ORDER — LATANOPROST 0.005 % OP SOLN
1.0000 [drp] | Freq: Every evening | OPHTHALMIC | Status: DC
  Administered 2020-02-26 – 2020-03-03 (×7): 1 [drp] via OPHTHALMIC
  Filled 2020-02-26 (×2): qty 2.5

## 2020-02-26 MED ORDER — FINASTERIDE 5 MG PO TABS
5.0000 mg | ORAL_TABLET | Freq: Every day | ORAL | Status: DC
  Administered 2020-02-26 – 2020-03-04 (×8): 5 mg via ORAL
  Filled 2020-02-26 (×7): qty 1

## 2020-02-26 MED ORDER — PANTOPRAZOLE SODIUM 40 MG PO TBEC
40.0000 mg | DELAYED_RELEASE_TABLET | Freq: Two times a day (BID) | ORAL | Status: DC
  Administered 2020-02-26 – 2020-03-04 (×15): 40 mg via ORAL
  Filled 2020-02-26 (×15): qty 1

## 2020-02-26 MED ORDER — LINACLOTIDE 145 MCG PO CAPS
290.0000 ug | ORAL_CAPSULE | Freq: Every day | ORAL | Status: DC
  Administered 2020-02-26 – 2020-03-04 (×7): 290 ug via ORAL
  Filled 2020-02-26 (×8): qty 2

## 2020-02-26 MED ORDER — LORAZEPAM 1 MG PO TABS
1.0000 mg | ORAL_TABLET | ORAL | Status: DC | PRN
  Administered 2020-02-26: 1 mg via ORAL
  Filled 2020-02-26: qty 1

## 2020-02-26 MED ORDER — FENTANYL CITRATE (PF) 100 MCG/2ML IJ SOLN
100.0000 ug | Freq: Once | INTRAMUSCULAR | Status: AC
  Administered 2020-02-26: 100 ug via INTRAVENOUS
  Filled 2020-02-26: qty 2

## 2020-02-26 MED ORDER — OXYBUTYNIN CHLORIDE ER 10 MG PO TB24
10.0000 mg | ORAL_TABLET | Freq: Every day | ORAL | Status: DC
  Administered 2020-02-26 – 2020-03-04 (×8): 10 mg via ORAL
  Filled 2020-02-26 (×8): qty 1

## 2020-02-26 MED ORDER — ADULT MULTIVITAMIN W/MINERALS CH
1.0000 | ORAL_TABLET | Freq: Every day | ORAL | Status: DC
  Administered 2020-02-26 – 2020-03-04 (×8): 1 via ORAL
  Filled 2020-02-26 (×8): qty 1

## 2020-02-26 MED ORDER — DORZOLAMIDE HCL-TIMOLOL MAL 2-0.5 % OP SOLN
1.0000 [drp] | Freq: Two times a day (BID) | OPHTHALMIC | Status: DC
  Administered 2020-02-26 – 2020-03-04 (×14): 1 [drp] via OPHTHALMIC
  Filled 2020-02-26 (×2): qty 10

## 2020-02-26 MED ORDER — METHENAMINE MANDELATE 1 G PO TABS
1.0000 g | ORAL_TABLET | Freq: Every day | ORAL | Status: DC
  Administered 2020-02-26 – 2020-02-28 (×3): 1 g via ORAL
  Filled 2020-02-26 (×3): qty 1

## 2020-02-26 MED ORDER — MELATONIN 5 MG PO TABS
10.0000 mg | ORAL_TABLET | Freq: Every day | ORAL | Status: DC
Start: 2020-02-26 — End: 2020-03-04
  Administered 2020-02-26 – 2020-03-03 (×7): 10 mg via ORAL
  Filled 2020-02-26 (×8): qty 2

## 2020-02-26 MED ORDER — ONDANSETRON HCL 4 MG/2ML IJ SOLN
4.0000 mg | Freq: Four times a day (QID) | INTRAMUSCULAR | Status: DC | PRN
  Administered 2020-02-27 – 2020-03-04 (×15): 4 mg via INTRAVENOUS
  Filled 2020-02-26 (×15): qty 2

## 2020-02-26 MED ORDER — CLOPIDOGREL BISULFATE 75 MG PO TABS
75.0000 mg | ORAL_TABLET | Freq: Every day | ORAL | Status: DC
Start: 2020-02-26 — End: 2020-03-04
  Administered 2020-02-26 – 2020-03-04 (×8): 75 mg via ORAL
  Filled 2020-02-26 (×8): qty 1

## 2020-02-26 MED ORDER — MORPHINE SULFATE (PF) 2 MG/ML IV SOLN
2.0000 mg | INTRAVENOUS | Status: DC | PRN
  Administered 2020-03-01 – 2020-03-04 (×2): 2 mg via INTRAVENOUS
  Filled 2020-02-26 (×2): qty 1

## 2020-02-26 MED ORDER — DOCUSATE SODIUM 100 MG PO CAPS
100.0000 mg | ORAL_CAPSULE | Freq: Two times a day (BID) | ORAL | Status: DC
  Administered 2020-02-26 – 2020-03-04 (×15): 100 mg via ORAL
  Filled 2020-02-26 (×15): qty 1

## 2020-02-26 MED ORDER — ASPIRIN EC 81 MG PO TBEC
81.0000 mg | DELAYED_RELEASE_TABLET | Freq: Every day | ORAL | Status: DC
  Administered 2020-02-26 – 2020-03-04 (×8): 81 mg via ORAL
  Filled 2020-02-26 (×8): qty 1

## 2020-02-26 MED ORDER — SODIUM CHLORIDE 0.9 % IV BOLUS (SEPSIS)
1000.0000 mL | Freq: Once | INTRAVENOUS | Status: AC
  Administered 2020-02-26: 1000 mL via INTRAVENOUS

## 2020-02-26 MED ORDER — ENOXAPARIN SODIUM 40 MG/0.4ML ~~LOC~~ SOLN
40.0000 mg | Freq: Every day | SUBCUTANEOUS | Status: DC
  Administered 2020-02-26 – 2020-03-04 (×8): 40 mg via SUBCUTANEOUS
  Filled 2020-02-26 (×8): qty 0.4

## 2020-02-26 MED ORDER — BISACODYL 5 MG PO TBEC: 5.0000 mg | DELAYED_RELEASE_TABLET | Freq: Every day | ORAL | Status: DC | PRN

## 2020-02-26 MED ORDER — RANOLAZINE ER 500 MG PO TB12
1000.0000 mg | ORAL_TABLET | Freq: Two times a day (BID) | ORAL | Status: DC
  Administered 2020-02-26 – 2020-03-04 (×15): 1000 mg via ORAL
  Filled 2020-02-26 (×16): qty 2

## 2020-02-26 MED ORDER — ALFUZOSIN HCL ER 10 MG PO TB24
10.0000 mg | ORAL_TABLET | Freq: Every day | ORAL | Status: DC
  Administered 2020-02-26 – 2020-03-04 (×8): 10 mg via ORAL
  Filled 2020-02-26 (×8): qty 1

## 2020-02-26 MED ORDER — SODIUM CHLORIDE 0.9% FLUSH
3.0000 mL | Freq: Two times a day (BID) | INTRAVENOUS | Status: DC
  Administered 2020-02-26 – 2020-03-03 (×10): 3 mL via INTRAVENOUS

## 2020-02-26 MED ORDER — BETHANECHOL CHLORIDE 25 MG PO TABS
50.0000 mg | ORAL_TABLET | Freq: Three times a day (TID) | ORAL | Status: DC
  Administered 2020-02-26 – 2020-03-03 (×21): 50 mg via ORAL
  Administered 2020-03-04: 25 mg via ORAL
  Filled 2020-02-26 (×24): qty 2

## 2020-02-26 NOTE — ED Notes (Signed)
Attempted to call report, RN will call back.

## 2020-02-26 NOTE — ED Notes (Signed)
Report given to [employer]. Pt transported to new room.

## 2020-02-26 NOTE — ED Notes (Signed)
Pt is requesting pain meds. Notified MD in regards to patient's request.

## 2020-02-26 NOTE — ED Provider Notes (Signed)
[facility] EMERGENCY DEPARTMENT Provider Note   CSN: [id number] Arrival date & time: 02/25/20  1648     History Chief Complaint  Patient presents with  . Fall  . Weakness    Austin Malone is a 80 y.o. male.  The history is provided by the patient.  Fall This is a new problem. The problem occurs constantly. The problem has not changed since onset.Associated symptoms include headaches. Pertinent negatives include no chest pain, no abdominal pain and no shortness of breath. The symptoms are aggravated by walking. The symptoms are relieved by rest.  Weakness Associated symptoms: arthralgias and headaches   Associated symptoms: no abdominal pain, no chest pain, no fever, no shortness of breath and no vomiting       Patient with extensive history including CAD, chronic indwelling Foley, hypertension presents after multiple falls.  Patient reports that yesterday he stood up after reading his Bible and he felt dizzy and fell.  No LOC.  He reports he landed on his back and has had pain ever since.  He reports he laid in the floor for several hours until he was able to crawl into his bed.  He reports later in the day, he was walking his dog when the same thing happened and he fell.  He reports persistent pain in his back.  He has mild headache.  No anterior chest pain or shortness of breath.  He also has right shoulder pain and abrasion of the left elbow.  No abdominal pain or vomiting.  Patient reports he has had a chronic indwelling Foley for years.  He reports this was changed on February 2 at [facility] and he has been on antibiotics for UTI.  He reports he was first on Cipro and is now on Levaquin Past Medical History:  Diagnosis Date  . Abnormal myocardial perfusion study 04/01/2015   Formatting of this note might be different from the original. LCF ischemia-known CTO, EF 30%  . AKI (acute kidney injury) (HCC) 02/19/2018  . Alcohol abuse 08/22/2017  . Anxiety    . Arthritis   . BPH (benign prostatic hyperplasia)   . CAD (coronary artery disease)   . CAD in native artery 06/07/2014   Formatting of this note might be different from the original. Recent [facility] admission 06/02/2014 with abnormal stress echo with inferolateral ischemia at 4.6 Mets and gray zone troponin  0.12  . Cataracts, bilateral   . Chest pain 05/23/2018  . Chest pain at rest 09/29/2017  . Chronic diastolic CHF (congestive heart failure) (HCC) 01/31/2018  . CKD (chronic kidney disease) stage 3, GFR 30-59 ml/min (HCC) 04/08/2019  . Coronary artery disease involving coronary bypass graft of native heart without angina pectoris 06/17/2014   Cardiac cath 06/18/14: Conclusions Diagnostic Summary 1. Severe Native CAD 2. SVG to Diag occluded 3. SVG to Cir occluded 4. SVG to R-PDA 95% mid body lesion 5. LIMA to LAD patent 6. LVEF is normal Diagnostic Recommendations PCI to SVG-RCA Interventional Summary Successful PCI / Xience Drug Eluting Stent of the mid SVG-RCA graft Interventional Recommendations  Cardiac cath 07/20/14: Conclusions PC  . Enlarged prostate   . Essential hypertension 03/24/2018  . Gallbladder sludge 04/01/2015  . Gastroesophageal reflux disease without esophagitis 07/21/2017  . GERD (gastroesophageal reflux disease)   . Glaucoma   . 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 Hypercholesteremia 07/21/2017  . Hyperglycemia 10/23/2018  . Hyperlipidemia   . Hypertension   . 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  Nausea & vomiting 04/08/2019  . Non-ST elevation (NSTEMI) myocardial infarction (HCC) 08/21/2017  . Normocytic anemia, not due to blood loss 02/19/2018  . Orthostatic hypotension 04/10/2019  . Pericarditis 02/03/2018  . Pneumonia   . Pre-operative clearance 09/08/2017  . Preoperative cardiovascular examination 09/17/2018  . PUD (peptic ulcer disease)   . 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 Rotator cuff tear   . Status post coronary artery stent placement   . Upper GI bleed 02/19/2018  . UTI  (urinary tract infection) 03/2019    Patient Active Problem List   Diagnosis Date Noted  . Arthritis   . CAD (coronary artery disease)   . Cataracts, bilateral   . Enlarged prostate   . Glaucoma   . GERD (gastroesophageal reflux disease)   . Hypertension   . Pneumonia   . Rotator cuff tear   . PUD (peptic ulcer disease)   . Status post coronary artery stent placement   . Unstable angina (HCC) 08/22/2019  . Orthostatic hypotension 04/10/2019  . Nausea & vomiting 04/08/2019  . CKD (chronic kidney disease) stage 3, GFR 30-59 ml/min (HCC) 04/08/2019  . 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 UTI (urinary tract infection) 03/2019  . Hyperglycemia 10/23/2018  . Preoperative cardiovascular examination 09/17/2018  . Chest pain 05/23/2018  . Essential hypertension 03/24/2018  . Upper GI bleed 02/19/2018  . Normocytic anemia, not due to blood loss 02/19/2018  . AKI (acute kidney injury) (HCC) 02/19/2018  . Pericarditis 02/03/2018  . Chronic diastolic CHF (congestive heart failure) (HCC) 01/31/2018  . Chest pain at rest 09/29/2017  . Pre-operative clearance 09/08/2017  . Alcohol abuse 08/22/2017  . Anxiety 08/22/2017  . Non-ST elevation (NSTEMI) myocardial infarction (HCC) 08/21/2017  . BPH (benign prostatic hyperplasia) 07/21/2017  . Gastroesophageal reflux disease without esophagitis 07/21/2017  . 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 Hypercholesteremia 07/21/2017  . 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 Gallbladder sludge 04/01/2015  . Abnormal myocardial perfusion study 04/01/2015  . Coronary artery disease involving coronary bypass graft of native heart with unstable angina pectoris (HCC) 06/17/2014  . CAD in native artery 06/07/2014    Past Surgical History:  Procedure Laterality Date  . APPENDECTOMY    . COLONOSCOPY WITH PROPOFOL N/A 02/21/2018   Procedure: COLONOSCOPY WITH PROPOFOL;  Surgeon: Sherrilyn Rist, MD;  Location: [facility] ENDOSCOPY;  Service: Gastroenterology;   Laterality: N/A;  . CORONARY ANGIOPLASTY WITH STENT PLACEMENT  11/2014   PCI of SVG to PDA x 2 with Xience DES to proximal stenosis, Ultra 5.0 BMS to distal thrombotic lesion  . CORONARY ARTERY BYPASS GRAFT  2008  . CORONARY ATHERECTOMY N/A 02/01/2018   Procedure: CORONARY ATHERECTOMY;  Surgeon: Yvonne Kendall, MD;  Location: [facility] INVASIVE CV LAB;  Service: Cardiovascular;  Laterality: N/A;  . CORONARY ATHERECTOMY N/A 08/24/2019   Procedure: CORONARY ATHERECTOMY;  Surgeon: Corky Crafts, MD;  Location: [facility] INVASIVE CV LAB;  Service: Cardiovascular;  Laterality: N/A;  . CORONARY STENT INTERVENTION N/A 02/01/2018   Procedure: CORONARY STENT INTERVENTION;  Surgeon: Yvonne Kendall, MD;  Location: [facility] INVASIVE CV LAB;  Service: Cardiovascular;  Laterality: N/A;  . CORONARY STENT INTERVENTION N/A 08/22/2019   Procedure: CORONARY STENT INTERVENTION;  Surgeon: Yvonne Kendall, MD;  Location: [facility] INVASIVE CV LAB;  Service: Cardiovascular;  Laterality: N/A;  . CORONARY STENT INTERVENTION N/A 08/24/2019   Procedure: CORONARY STENT INTERVENTION;  Surgeon: Corky Crafts, MD;  Location: [facility] INVASIVE CV LAB;  Service: Cardiovascular;  Laterality: N/A;  . ESOPHAGOGASTRODUODENOSCOPY (EGD) WITH  PROPOFOL N/A 02/20/2018   Procedure: ESOPHAGOGASTRODUODENOSCOPY (EGD) WITH PROPOFOL;  Surgeon: Sherrilyn Rist, MD;  Location: [facility] ENDOSCOPY;  Service: Gastroenterology;  Laterality: N/A;  . EYE SURGERY    . INTRAVASCULAR LITHOTRIPSY  08/24/2019   Procedure: INTRAVASCULAR LITHOTRIPSY;  Surgeon: Corky Crafts, MD;  Location: [facility] INVASIVE CV LAB;  Service: Cardiovascular;;  . INTRAVASCULAR ULTRASOUND/IVUS N/A 02/01/2018   Procedure: Intravascular Ultrasound/IVUS;  Surgeon: Yvonne Kendall, MD;  Location: [facility] INVASIVE CV LAB;  Service: Cardiovascular;  Laterality: N/A;  . INTRAVASCULAR ULTRASOUND/IVUS N/A 08/24/2019   Procedure: Intravascular Ultrasound/IVUS;  Surgeon: Corky Crafts, MD;  Location: [facility]  INVASIVE CV LAB;  Service: Cardiovascular;  Laterality: N/A;  . LEFT HEART CATH AND CORS/GRAFTS ANGIOGRAPHY N/A 08/21/2017   Procedure: LEFT HEART CATH AND CORS/GRAFTS ANGIOGRAPHY;  Surgeon: Yvonne Kendall, MD;  Location: [facility] INVASIVE CV LAB;  Service: Cardiovascular;  Laterality: N/A;  . LEFT HEART CATH AND CORS/GRAFTS ANGIOGRAPHY N/A 02/01/2018   Procedure: LEFT HEART CATH AND CORS/GRAFTS ANGIOGRAPHY;  Surgeon: Yvonne Kendall, MD;  Location: [facility] INVASIVE CV LAB;  Service: Cardiovascular;  Laterality: N/A;  . LEFT HEART CATH AND CORS/GRAFTS ANGIOGRAPHY N/A 05/24/2018   Procedure: LEFT HEART CATH AND CORS/GRAFTS ANGIOGRAPHY;  Surgeon: Runell Gess, MD;  Location: [facility] INVASIVE CV LAB;  Service: Cardiovascular;  Laterality: N/A;  . LEFT HEART CATH AND CORS/GRAFTS ANGIOGRAPHY N/A 08/22/2019   Procedure: LEFT HEART CATH AND CORS/GRAFTS ANGIOGRAPHY;  Surgeon: Yvonne Kendall, MD;  Location: [facility] INVASIVE CV LAB;  Service: Cardiovascular;  Laterality: N/A;  . LEG SURGERY    . TONSILLECTOMY         Family History  Problem Relation Age of Onset  . Diabetes Mother   . Heart attack Brother     Social History   Tobacco Use  . Smoking status: Former Games developer  . Smokeless tobacco: Never Used  Vaping Use  . Vaping Use: Never used  Substance Use Topics  . Alcohol use: Yes    Alcohol/week: 21.0 standard drinks    Types: 21 Cans of beer per week  . Drug use: Yes    Types: Marijuana    Home Medications Prior to Admission medications   Medication Sig Start Date End Date Taking? Authorizing Provider  alfuzosin (UROXATRAL) 10 MG 24 hr tablet Take 10 mg by mouth daily.  06/21/18   [provider]  ALPRAZolam Prudy Feeler) 0.5 MG tablet Take 0.5 mg by mouth 2 (two) times daily as needed for anxiety or sleep.  05/10/14   [provider]  Ascorbic Acid (VITAMIN C) 1000 MG tablet Take 1,000 mg by mouth in the morning, at noon, and at bedtime.    [provider]  aspirin EC 81 MG tablet  Take 81 mg by mouth daily.    [provider]  b complex vitamins tablet Take 1 tablet by mouth daily.    [provider]  bethanechol (URECHOLINE) 50 MG tablet Take 50 mg by mouth 3 (three) times daily.  07/26/18   [provider]  carvedilol (COREG) 6.25 MG tablet Take 1 tablet by mouth 2 (two) times daily. 09/01/16   [provider]  clopidogrel (PLAVIX) 75 MG tablet TAKE 1 TABLET BY MOUTH ONCE (1) DAILY 05/27/19   Revankar, Aundra Dubin, MD  docusate sodium (COLACE) 100 MG capsule Take 1 capsule (100 mg total) by mouth daily. 04/11/19   Lanae Boast, MD  dorzolamide-timolol (COSOPT) 22.3-6.8 MG/ML ophthalmic solution Place 1 drop into both eyes 2 (two) times daily. 01/29/18  [provider]  ezetimibe (ZETIA) 10 MG tablet TAKE 1 TABLET BY MOUTH ONCE (1) DAILY 01/30/20   Revankar, Aundra Dubin, MD  finasteride (PROSCAR) 5 MG tablet Take 1 tablet (5 mg total) by mouth daily. 02/02/18   Rai, Delene Ruffini, MD  furosemide (LASIX) 20 MG tablet Take 20 mg by mouth daily.  08/13/18   [provider]  isosorbide mononitrate (IMDUR) 60 MG 24 hr tablet Take 60 mg by mouth daily. 08/30/19   [provider]  latanoprost (XALATAN) 0.005 % ophthalmic solution Place 1 drop into both eyes every evening.  08/22/19   [provider]  linaclotide (LINZESS) 290 MCG CAPS capsule Take 290 mcg by mouth daily before breakfast.    [provider]  Melatonin 12 MG TABS Take 10 mg by mouth at bedtime.     [provider]  methenamine (HIPREX) 1 g tablet Take 1 g by mouth daily. 08/23/19   [provider]  Multiple Vitamins-Minerals (MULTIVITAMIN WITH MINERALS) tablet Take 1 tablet by mouth daily.    [provider]  nitroGLYCERIN (NITROSTAT) 0.4 MG SL tablet PLACE 1 TABLET UNDER THE TONGUE AS NEEDED FOR CHEST PAIN ( MAY REPEAT EVERY5 MINUTES X 3) 12/12/19   Revankar, Aundra Dubin, MD  ondansetron (ZOFRAN) 8 MG tablet Take 8 mg by mouth 2 (two)  times daily as needed for nausea or vomiting.     [provider]  oxybutynin (DITROPAN-XL) 10 MG 24 hr tablet Take 10 mg by mouth daily. 05/04/19   [provider]  pantoprazole (PROTONIX) 40 MG tablet Take 1 tablet (40 mg total) by mouth 2 (two) times daily. 04/11/19 08/21/20  Lanae Boast, MD  ranolazine (RANEXA) 1000 MG SR tablet Take 1,000 mg by mouth 2 (two) times daily.  02/25/16   [provider]  traMADol (ULTRAM) 50 MG tablet Take 50 mg by mouth 2 (two) times daily as needed for moderate pain.  04/05/19   [provider]    Allergies    Codeine, Lisinopril, and Contrast media [iodinated diagnostic agents]  Review of Systems   Review of Systems  Constitutional: Positive for fatigue. Negative for fever.  Respiratory: Negative for shortness of breath.   Cardiovascular: Negative for chest pain.  Gastrointestinal: Negative for abdominal pain and vomiting.  Musculoskeletal: Positive for arthralgias and back pain.  Skin: Positive for wound.  Neurological: Positive for weakness and headaches.  All other systems reviewed and are negative.   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  No bruising/crepitance/stepoffs noted to spine CV: S1/S2 noted, no murmurs/rubs/gallops noted LUNGS: Lungs are clear to auscultation bilaterally, no apparent distress Chest-no bruising or crepitus ABDOMEN: soft, nontender, no rebound or guarding, bowel sounds noted throughout abdomen GU:no cva tenderness NEURO: Pt is awake/alert/appropriate, moves all extremitiesx4.  No facial droop.  No  arm or leg drift EXTREMITIES: pulses normal/equal, full ROM, mild tenderness to right shoulder but no deformities.  He can fully range the right shoulder  abrasion noted to left elbow.  All other extremities/joints palpated/ranged and nontender SKIN: warm, color normal PSYCH: no abnormalities of mood noted, alert and oriented to  situation  ED Results / Procedures / Treatments   Labs (all labs ordered are listed, but only abnormal results are displayed) Labs Reviewed  BASIC METABOLIC PANEL - Abnormal; Notable for the following components:      Result Value   Sodium 127 (*)    Chloride 91 (*)    CO2 19 (*)    Anion gap 17 (*)    All other components within normal limits  CBC - Abnormal; Notable for the following components:   RBC 3.51 (*)    Hemoglobin 12.3 (*)    HCT 35.3 (*)    MCV 100.6 (*)    MCH 35.0 (*)    All other components within normal limits  URINALYSIS, ROUTINE W REFLEX MICROSCOPIC - Abnormal; Notable for the following components:   Color, Urine AMBER (*)    APPearance HAZY (*)    Ketones, ur 80 (*)    Leukocytes,Ua MODERATE (*)    Bacteria, UA MANY (*)    All other components within normal limits  CK - Abnormal; Notable for the following components:   Total CK 46 (*)    All other components within normal limits  URINE CULTURE  SARS CORONAVIRUS 2 (TAT 6-24 HRS)  CBG MONITORING, ED    EKG EKG Interpretation  Date/Time:  Saturday February 25 2020 17:13:58 EST Ventricular Rate:  61 PR Interval:  194 QRS Duration: 92 QT Interval:  486 QTC Calculation: 489 R Axis:   -21 Text Interpretation: Normal sinus rhythm Prolonged QT Abnormal ECG Interpretation limited secondary to artifact Confirmed by Zadie Rhine ([id number]) on 02/26/2020 3:43:41 AM   Radiology DG Chest 1 View  Result Date: 02/26/2020 CLINICAL DATA:  Pain, fall EXAM: CHEST  1 VIEW COMPARISON:  Chest x-ray 08/22/2019 FINDINGS: The heart size and mediastinal contours are unchanged. Aortic arch  calcification. Sternotomy wires well as cardiac surgical changes overlie the mediastinum. Similar-appearing scattered calcified granuloma. No focal consolidation. No pulmonary edema. No pleural effusion. No pneumothorax. No acute osseous abnormality. Multilevel degenerative changes of the spine. IMPRESSION: No active disease. Electronically Signed   By: Tish Frederickson M.D.   On: 02/26/2020 05:13   DG Lumbar Spine Complete  Result Date: 02/26/2020 CLINICAL DATA:  Pain, fall, weakness. EXAM: LUMBAR SPINE - COMPLETE 4+ VIEW COMPARISON:  CT renal 04/08/2019 FINDINGS: Five non-rib-bearing lumbar vertebral bodies are identified. Multilevel at least moderate degenerative changes of the spine including osteophyte formation and facet arthropathy. There is no evidence of lumbar spine fracture. Markedly limited evaluation of the sacrum on frontal view due to overlying bowel gas. Alignment is normal. Intervertebral disc spaces are maintained. Severe atherosclerotic plaque of the abdominal aorta IMPRESSION: 1. No acute displaced fracture or traumatic listhesis of the lumbar spine in a patient with multilevel degenerative changes. 2.  Aortic Atherosclerosis (ICD10-I70.0). Electronically Signed   By: Tish Frederickson M.D.   On: 02/26/2020 05:17   CT Head Wo Contrast  Result Date: 02/26/2020 CLINICAL DATA:  Neck trauma. EXAM: CT HEAD WITHOUT CONTRAST CT CERVICAL SPINE WITHOUT CONTRAST TECHNIQUE: Multidetector CT imaging of the head and cervical spine was performed following the standard protocol without intravenous contrast. Multiplanar CT image reconstructions of the cervical spine were also generated. COMPARISON:  CT head 06/27/2017 FINDINGS: CT HEAD FINDINGS Brain: Cerebral ventricle sizes are concordant with the degree of cerebral volume loss. Patchy and confluent areas of decreased attenuation are noted throughout the deep and periventricular white matter of the cerebral hemispheres bilaterally, compatible with chronic  microvascular ischemic disease. Redemonstration  of hypodensity within the left basal ganglia that could represent prior infarction. No evidence of large-territorial acute infarction. No parenchymal hemorrhage. No mass lesion. No extra-axial collection. No mass effect or midline shift. No hydrocephalus. Basilar cisterns are patent. Vascular: No hyperdense vessel. Atherosclerotic calcifications are present within the cavernous internal carotid and vertebral arteries. Skull: No acute fracture or focal lesion. Sinuses/Orbits: Persistent opacification of the right mastoid cells and middle ear. Paranasal sinuses and left mastoid air cells are clear. Similar-appearing right orbit hyperdensity likely postsurgical. Lens replacement on the left. Similar-appearing slightly heterogeneous matrix on the left. Other: None. CT CERVICAL SPINE FINDINGS Alignment: Straightening of the normal cervical lordosis likely due to positioning and degenerative changes. Skull base and vertebrae: Multilevel degenerative changes of the spine. No acute fracture. No aggressive appearing focal osseous lesion or focal pathologic process. Soft tissues and spinal canal: No prevertebral fluid or swelling. No visible canal hematoma. Upper chest: Mild emphysematous changes. Other: None. IMPRESSION: 1. No acute intracranial abnormality. 2. No acute displaced fracture or traumatic listhesis of the cervical spine. Electronically Signed   By: Tish FredericksonMorgane  Naveau M.D.   On: 02/26/2020 04:42   CT Cervical Spine Wo Contrast  Result Date: 02/26/2020 CLINICAL DATA:  Neck trauma. EXAM: CT HEAD WITHOUT CONTRAST CT CERVICAL SPINE WITHOUT CONTRAST TECHNIQUE: Multidetector CT imaging of the head and cervical spine was performed following the standard protocol without intravenous contrast. Multiplanar CT image reconstructions of the cervical spine were also generated. COMPARISON:  CT head 06/27/2017 FINDINGS: CT HEAD FINDINGS Brain: Cerebral ventricle sizes are  concordant with the degree of cerebral volume loss. Patchy and confluent areas of decreased attenuation are noted throughout the deep and periventricular white matter of the cerebral hemispheres bilaterally, compatible with chronic microvascular ischemic disease. Redemonstration of hypodensity within the left basal ganglia that could represent prior infarction. No evidence of large-territorial acute infarction. No parenchymal hemorrhage. No mass lesion. No extra-axial collection. No mass effect or midline shift. No hydrocephalus. Basilar cisterns are patent. Vascular: No hyperdense vessel. Atherosclerotic calcifications are present within the cavernous internal carotid and vertebral arteries. Skull: No acute fracture or focal lesion. Sinuses/Orbits: Persistent opacification of the right mastoid cells and middle ear. Paranasal sinuses and left mastoid air cells are clear. Similar-appearing right orbit hyperdensity likely postsurgical. Lens replacement on the left. Similar-appearing slightly heterogeneous matrix on the left. Other: None. CT CERVICAL SPINE FINDINGS Alignment: Straightening of the normal cervical lordosis likely due to positioning and degenerative changes. Skull base and vertebrae: Multilevel degenerative changes of the spine. No acute fracture. No aggressive appearing focal osseous lesion or focal pathologic process. Soft tissues and spinal canal: No prevertebral fluid or swelling. No visible canal hematoma. Upper chest: Mild emphysematous changes. Other: None. IMPRESSION: 1. No acute intracranial abnormality. 2. No acute displaced fracture or traumatic listhesis of the cervical spine. Electronically Signed   By: Tish FredericksonMorgane  Naveau M.D.   On: 02/26/2020 04:42    Procedures Procedures   Medications Ordered in ED Medications  sodium chloride 0.9 % bolus 1,000 mL (0 mLs Intravenous Stopped 02/26/20 0550)  fentaNYL (SUBLIMAZE) injection 100 mcg (100 mcg Intravenous Given 02/26/20 0524)  ondansetron  (ZOFRAN) injection 4 mg (4 mg Intravenous Given 02/26/20 0549)    ED Course  I have reviewed the triage vital signs and the nursing notes.  Pertinent labs & imaging results that were available during my care of the patient were reviewed by me and considered in my medical decision making (see chart for details).  MDM Rules/Calculators/A&P                          4:39 AM Patient presents for 2 falls in the past 24 hours.  Patient reports he feels very unsteady on his feet and has difficulty walking. Labs and imaging are pending at this time 6:43 AM No acute traumatic findings on imaging.  Patient requires multiple person assist even to get in and out of bed.  He was unable to complete orthostatics.  He reports he was actually walking unassisted several days ago.  Patient is too unstable to be discharged home.  He will require admission, monitoring rehydration.  If this does not improve his symptoms, would entertain occult stroke at that time.  Discussed with Dr. Rachael Darby for admission Final Clinical Impression(s) / ED Diagnoses Final diagnoses:  Dehydration  Hyponatremia  Weakness  Fall, initial encounter    Rx / DC Orders ED Discharge Orders    None       Zadie Rhine, MD 02/26/20 [phone]

## 2020-02-26 NOTE — Evaluation (Signed)
Physical Therapy Evaluation Patient Details Name: Austin Malone MRN: [id number] DOB: Oct 18, 1940 Today's Date: 02/26/2020   History of Present Illness  80 y.o. male with medical history significant of CAD s/p stents and CABG; HTN; HLD; Glaucoma; stage 3 CKD; chronic diastolic CHF; ETOH abuse; and BPH with indwelling foley presenting with weakness and falls.  He had 2 falls yesterday and injured his back.  Clinical Impression  Pt presents to PT with deficits in functional mobility, gait, balance, endurance, strength, power, and with back pain. Pt lives alone and is unable to identify any consistent caregivers at this time. Pt currently requires assistance for all functional mobility tasks due to generalized weakness. Pt is unable to get out of bed safely without physical assistance and is limited in ambulation tolerance due to pain and poor endurance. Pt will benefit from continued acute PT services to improve mobility quality and to reduce falls risk. PT recommends SNF placement at this time.    Follow Up Recommendations SNF (assistance for all mobility)    Equipment Recommendations  None recommended by PT    Recommendations for Other Services       Precautions / Restrictions Precautions Precautions: Fall Restrictions Weight Bearing Restrictions: No      Mobility  Bed Mobility Overal bed mobility: Needs Assistance Bed Mobility: Supine to Sit;Sit to Supine     Supine to sit: Min assist Sit to supine: Min assist        Transfers Overall transfer level: Needs assistance Equipment used: Rolling walker (2 wheeled) Transfers: Sit to/from Stand Sit to Stand: Min assist            Ambulation/Gait Ambulation/Gait assistance: Min guard Gait Distance (Feet): 15 Feet Assistive device: Rolling walker (2 wheeled) Gait Pattern/deviations: Step-to pattern Gait velocity: reduced Gait velocity interpretation: <1.31 ft/sec, indicative of household ambulator General Gait  Details: pt with slowed step-to gait, reduced gait speed  Stairs            Wheelchair Mobility    Modified Rankin (Stroke Patients Only)       Balance Overall balance assessment: Needs assistance Sitting-balance support: Single extremity supported;Bilateral upper extremity supported;Feet unsupported Sitting balance-Leahy Scale: Poor     Standing balance support: Bilateral upper extremity supported Standing balance-Leahy Scale: Poor Standing balance comment: reliant on UE support of RW                             Pertinent Vitals/Pain Pain Assessment: 0-10 Pain Score: 5  Pain Location: back Pain Descriptors / Indicators: Aching Pain Intervention(s): Monitored during session    Home Living Family/patient expects to be discharged to:: Private residence Living Arrangements: Alone Available Help at Discharge: Family;Available PRN/intermittently Type of Home: Mobile home       Home Layout: One level Home Equipment: Walker - 2 wheels;Cane - single point      Prior Function Level of Independence: Independent with assistive device(s)         Comments: pt ambulates with RW outdoors     Hand Dominance        Extremity/Trunk Assessment   Upper Extremity Assessment Upper Extremity Assessment: Generalized weakness    Lower Extremity Assessment Lower Extremity Assessment: Generalized weakness    Cervical / Trunk Assessment Cervical / Trunk Assessment: Normal  Communication   Communication: No difficulties  Cognition Arousal/Alertness: Awake/alert Behavior During Therapy: WFL for tasks assessed/performed Overall Cognitive Status: Within Functional Limits for tasks assessed  General Comments General comments (skin integrity, edema, etc.): VSS on RA    Exercises     Assessment/Plan    PT Assessment Patient needs continued PT services  PT Problem List Decreased strength;Decreased  activity tolerance;Decreased mobility;Decreased balance;Decreased knowledge of use of DME;Decreased safety awareness;Decreased knowledge of precautions;Pain       PT Treatment Interventions DME instruction;Gait training;Patient/family education;Stair training;Functional mobility training;Therapeutic activities;Therapeutic exercise;Balance training;Neuromuscular re-education    PT Goals (Current goals can be found in the Care Plan section)  Acute Rehab PT Goals Patient Stated Goal: to improve mobility and reduce falls risk PT Goal Formulation: With patient Time For Goal Achievement: 03/11/20 Potential to Achieve Goals: Good    Frequency Min 3X/week (3x to see if possible to progress to home)   Barriers to discharge Decreased caregiver support      Co-evaluation               AM-PAC PT "6 Clicks" Mobility  Outcome Measure Help needed turning from your back to your side while in a flat bed without using bedrails?: A Little Help needed moving from lying on your back to sitting on the side of a flat bed without using bedrails?: A Little Help needed moving to and from a bed to a chair (including a wheelchair)?: A Little Help needed standing up from a chair using your arms (e.g., wheelchair or bedside chair)?: A Little Help needed to walk in hospital room?: A Little Help needed climbing 3-5 steps with a railing? : A Lot 6 Click Score: 17    End of Session   Activity Tolerance: Patient tolerated treatment well Patient left: in bed;with call bell/phone within reach Nurse Communication: Mobility status PT Visit Diagnosis: Unsteadiness on feet (R26.81);Other abnormalities of gait and mobility (R26.89);Muscle weakness (generalized) (M62.81);History of falling (Z91.81)    Time: [id number] PT Time Calculation (min) (ACUTE ONLY): 16 min   Charges:   PT Evaluation $PT Eval Low Complexity: 1 Low          Arlyss Gandy, PT, DPT Acute Rehabilitation Pager: [phone]   Arlyss Gandy 02/26/2020, 2:27 PM

## 2020-02-26 NOTE — ED Notes (Signed)
Pt requesting meds for pain,

## 2020-02-26 NOTE — H&P (Signed)
History and Physical    Austin Malone VQM:[id number] DOB: [birth year] DOA: 02/25/2020  PCP: Galvin Proffer, MD Consultants:  Revankar - cardiology; Danis - GI Patient coming from:  Home - lives alone; NOK: Step-daughter, Zollie Pee, [phone]; [phone]  Chief Complaint: Weakness, falls  HPI: Austin Malone is a 80 y.o. male with medical history significant of CAD s/p stents and CABG; HTN; HLD; Glaucoma; stage 3 CKD; chronic diastolic CHF; ETOH abuse; and BPH with indwelling foley presenting with weakness and falls.  He had 2 falls yesterday and injured his back.  He thinks he was falling due to a UTI that has been going on for months.  He had been sitting down for a couple of hours and tried to get up and maybe he got up too quickly.  His back hurt so much that he laid there for a while and then crawled back to bed.  Later that day, he let his dog back in the house and was taking off the leash - when he stood up, he fell backwards again.  He has had an indwelling foley for years.    ED Course:   Carryover, per Dr. Rachael Darby:  80 yo male presents from home with generalized weakness and falls at home multiple times in past 24 hours. Found to have hyponatremia and dehydration. Has chronic indwelling foley that was recently changed and is on levaquin for UTI from outpatient MD. Urine culture sent by ER.    Review of Systems: As per HPI; otherwise review of systems reviewed and negative.   Ambulatory Status:  Ambulates with a walker  COVID Vaccine Status:  None  Past Medical History:  Diagnosis Date  . Alcohol abuse 08/22/2017  . Anxiety   . Arthritis   . BPH (benign prostatic hyperplasia)   . Cataracts, bilateral   . Chronic diastolic CHF (congestive heart failure) (HCC) 01/31/2018  . CKD (chronic kidney disease) stage 3, GFR 30-59 ml/min (HCC) 04/08/2019  . Coronary artery disease involving coronary bypass graft of native heart without angina pectoris 06/17/2014   Cardiac  cath 06/18/14: Conclusions Diagnostic Summary 1. Severe Native CAD 2. SVG to Diag occluded 3. SVG to Cir occluded 4. SVG to R-PDA 95% mid body lesion 5. LIMA to LAD patent 6. LVEF is normal Diagnostic Recommendations PCI to SVG-RCA Interventional Summary Successful PCI / Xience Drug Eluting Stent of the mid SVG-RCA graft Interventional Recommendations  Cardiac cath 07/20/14: Conclusions PC  . Essential hypertension 03/24/2018  . Gallbladder sludge 04/01/2015  . Gastroesophageal reflux disease without esophagitis 07/21/2017  . Glaucoma   . Hyperglycemia 10/23/2018  . Hyperlipidemia   . 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 Normocytic anemia, not due to blood loss 02/19/2018  . Orthostatic hypotension 04/10/2019  . Pericarditis 02/03/2018  . Pneumonia   . 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 Rotator cuff tear   . Upper GI bleed 02/19/2018    Past Surgical History:  Procedure Laterality Date  . APPENDECTOMY    . COLONOSCOPY WITH PROPOFOL N/A 02/21/2018   Procedure: COLONOSCOPY WITH PROPOFOL;  Surgeon: Sherrilyn Rist, MD;  Location: [facility] ENDOSCOPY;  Service: Gastroenterology;  Laterality: N/A;  . CORONARY ANGIOPLASTY WITH STENT PLACEMENT  11/2014   PCI of SVG to PDA x 2 with Xience DES to proximal stenosis, Ultra 5.0 BMS to distal thrombotic lesion  . CORONARY ARTERY BYPASS GRAFT  2008  . CORONARY ATHERECTOMY N/A 02/01/2018   Procedure: CORONARY ATHERECTOMY;  Surgeon: Yvonne Kendall, MD;  Location: [facility] INVASIVE CV LAB;  Service: Cardiovascular;  Laterality: N/A;  . CORONARY ATHERECTOMY N/A 08/24/2019   Procedure: CORONARY ATHERECTOMY;  Surgeon: Corky Crafts, MD;  Location: [facility] INVASIVE CV LAB;  Service: Cardiovascular;  Laterality: N/A;  . CORONARY STENT INTERVENTION N/A 02/01/2018   Procedure: CORONARY STENT INTERVENTION;  Surgeon: Yvonne Kendall, MD;  Location: [facility] INVASIVE CV LAB;  Service: Cardiovascular;  Laterality: N/A;  . CORONARY STENT INTERVENTION N/A 08/22/2019   Procedure:  CORONARY STENT INTERVENTION;  Surgeon: Yvonne Kendall, MD;  Location: [facility] INVASIVE CV LAB;  Service: Cardiovascular;  Laterality: N/A;  . CORONARY STENT INTERVENTION N/A 08/24/2019   Procedure: CORONARY STENT INTERVENTION;  Surgeon: Corky Crafts, MD;  Location: [facility] INVASIVE CV LAB;  Service: Cardiovascular;  Laterality: N/A;  . ESOPHAGOGASTRODUODENOSCOPY (EGD) WITH PROPOFOL N/A 02/20/2018   Procedure: ESOPHAGOGASTRODUODENOSCOPY (EGD) WITH PROPOFOL;  Surgeon: Sherrilyn Rist, MD;  Location: [facility] ENDOSCOPY;  Service: Gastroenterology;  Laterality: N/A;  . EYE SURGERY    . INTRAVASCULAR LITHOTRIPSY  08/24/2019   Procedure: INTRAVASCULAR LITHOTRIPSY;  Surgeon: Corky Crafts, MD;  Location: [facility] INVASIVE CV LAB;  Service: Cardiovascular;;  . INTRAVASCULAR ULTRASOUND/IVUS N/A 02/01/2018   Procedure: Intravascular Ultrasound/IVUS;  Surgeon: Yvonne Kendall, MD;  Location: [facility] INVASIVE CV LAB;  Service: Cardiovascular;  Laterality: N/A;  . INTRAVASCULAR ULTRASOUND/IVUS N/A 08/24/2019   Procedure: Intravascular Ultrasound/IVUS;  Surgeon: Corky Crafts, MD;  Location: [facility] INVASIVE CV LAB;  Service: Cardiovascular;  Laterality: N/A;  . LEFT HEART CATH AND CORS/GRAFTS ANGIOGRAPHY N/A 08/21/2017   Procedure: LEFT HEART CATH AND CORS/GRAFTS ANGIOGRAPHY;  Surgeon: Yvonne Kendall, MD;  Location: [facility] INVASIVE CV LAB;  Service: Cardiovascular;  Laterality: N/A;  . LEFT HEART CATH AND CORS/GRAFTS ANGIOGRAPHY N/A 02/01/2018   Procedure: LEFT HEART CATH AND CORS/GRAFTS ANGIOGRAPHY;  Surgeon: Yvonne Kendall, MD;  Location: [facility] INVASIVE CV LAB;  Service: Cardiovascular;  Laterality: N/A;  . LEFT HEART CATH AND CORS/GRAFTS ANGIOGRAPHY N/A 05/24/2018   Procedure: LEFT HEART CATH AND CORS/GRAFTS ANGIOGRAPHY;  Surgeon: Runell Gess, MD;  Location: [facility] INVASIVE CV LAB;  Service: Cardiovascular;  Laterality: N/A;  . LEFT HEART CATH AND CORS/GRAFTS ANGIOGRAPHY N/A 08/22/2019   Procedure: LEFT HEART CATH AND  CORS/GRAFTS ANGIOGRAPHY;  Surgeon: Yvonne Kendall, MD;  Location: [facility] INVASIVE CV LAB;  Service: Cardiovascular;  Laterality: N/A;  . LEG SURGERY    . TONSILLECTOMY      Social History   Socioeconomic History  . Marital status: Widowed    Spouse name: Not on file  . Number of children: Not on file  . Years of education: Not on file  . Highest education level: Not on file  Occupational History  . Occupation: Retired  Tobacco Use  . Smoking status: Former Smoker    Quit date: 1976    Years since quitting: 46.1  . Smokeless tobacco: Never Used  Vaping Use  . Vaping Use: Never used  Substance and Sexual Activity  . Alcohol use: Yes    Alcohol/week: 21.0 standard drinks    Types: 21 Cans of beer per week    Comment: 2-4 beers a day  . Drug use: Yes    Types: Marijuana    Comment: daily use  . Sexual activity: Not on file  Other Topics Concern  . Not on file  Social History Narrative   He lives in [place], [place] alone.   Social Determinants of Health   Financial Resource Strain: Not on file  Food Insecurity: Not on file  Transportation Needs: Not on file  Physical Activity: Not on file  Stress: Not on file  Social Connections: Not on file  Intimate Partner Violence: Not on file    Allergies  Allergen Reactions  . Codeine Other (See Comments)    unknown  . Lisinopril Cough  . Contrast Media [Iodinated Diagnostic Agents] Hives and Rash    Family History  Problem Relation Age of Onset  . Diabetes Mother   . Heart attack Brother     Prior to Admission medications   Medication Sig Start Date End Date Taking? Authorizing Provider  alfuzosin (UROXATRAL) 10 MG 24 hr tablet Take 10 mg by mouth daily.  06/21/18   [provider]  ALPRAZolam Prudy Feeler(XANAX) 0.5 MG tablet Take 0.5 mg by mouth 2 (two) times daily as needed for anxiety or sleep.  05/10/14   [provider]  Ascorbic Acid (VITAMIN C) 1000 MG tablet Take 1,000 mg by mouth in the morning,  at noon, and at bedtime.    [provider]  aspirin EC 81 MG tablet Take 81 mg by mouth daily.    [provider]  b complex vitamins tablet Take 1 tablet by mouth daily.    [provider]  bethanechol (URECHOLINE) 50 MG tablet Take 50 mg by mouth 3 (three) times daily.  07/26/18   [provider]  carvedilol (COREG) 6.25 MG tablet Take 1 tablet by mouth 2 (two) times daily. 09/01/16   [provider]  clopidogrel (PLAVIX) 75 MG tablet TAKE 1 TABLET BY MOUTH ONCE (1) DAILY 05/27/19   Revankar, Aundra Dubinajan R, MD  docusate sodium (COLACE) 100 MG capsule Take 1 capsule (100 mg total) by mouth daily. 04/11/19   Lanae BoastKc, Ramesh, MD  dorzolamide-timolol (COSOPT) 22.3-6.8 MG/ML ophthalmic solution Place 1 drop into both eyes 2 (two) times daily. 01/29/18   [provider]  ezetimibe (ZETIA) 10 MG tablet TAKE 1 TABLET BY MOUTH ONCE (1) DAILY 01/30/20   Revankar, Aundra Dubinajan R, MD  finasteride (PROSCAR) 5 MG tablet Take 1 tablet (5 mg total) by mouth daily. 02/02/18   Rai, Delene Ruffiniipudeep K, MD  furosemide (LASIX) 20 MG tablet Take 20 mg by mouth daily.  08/13/18   [provider]  isosorbide mononitrate (IMDUR) 60 MG 24 hr tablet Take 60 mg by mouth daily. 08/30/19   [provider]  latanoprost (XALATAN) 0.005 % ophthalmic solution Place 1 drop into both eyes every evening.  08/22/19   [provider]  linaclotide (LINZESS) 290 MCG CAPS capsule Take 290 mcg by mouth daily before breakfast.    [provider]  Melatonin 12 MG TABS Take 10 mg by mouth at bedtime.     [provider]  methenamine (HIPREX) 1 g tablet Take 1 g by mouth daily. 08/23/19   [provider]  Multiple Vitamins-Minerals (MULTIVITAMIN WITH MINERALS) tablet Take 1 tablet by mouth daily.    [provider]  nitroGLYCERIN (NITROSTAT) 0.4 MG SL tablet PLACE 1 TABLET UNDER THE TONGUE AS NEEDED FOR CHEST PAIN ( MAY REPEAT EVERY5 MINUTES X 3) 12/12/19    Revankar, Aundra Dubinajan R, MD  ondansetron (ZOFRAN) 8 MG tablet Take 8 mg by mouth 2 (two) times daily as needed for nausea or vomiting.     [provider]  oxybutynin (DITROPAN-XL) 10 MG 24 hr tablet Take 10 mg by mouth daily. 05/04/19   [provider]  pantoprazole (PROTONIX) 40 MG tablet Take 1 tablet (40 mg total) by mouth 2 (two)  times daily. 04/11/19 08/21/20  Lanae Boast, MD  ranolazine (RANEXA) 1000 MG SR tablet Take 1,000 mg by mouth 2 (two) times daily.  02/25/16   [provider]  traMADol (ULTRAM) 50 MG tablet Take 50 mg by mouth 2 (two) times daily as needed for moderate pain.  04/05/19   [provider]    Physical Exam: Vitals:   02/26/20 0047 02/26/20 0345 02/26/20 0400 02/26/20 0455  BP: (!) 160/68 (!) 190/91 (!) 204/78 (!) 162/71  Pulse: 72 73 70 68  Resp: 16 18 20 18   Temp: 98.7 F (37.1 C)     TempSrc: Oral     SpO2: 99% 99% 100% 100%     . General:  Appears calm and comfortable and is in NAD . Eyes:  PERRL, EOMI, normal lids, iris . ENT:  grossly normal hearing, lips & tongue, mildly dry mm . Neck:  no LAD, masses or thyromegaly . Cardiovascular:  RRR, no m/r/g. 1-2+ LE edema.  Respiratory:   CTA bilaterally with no wheezes/rales/rhonchi.  Normal respiratory effort. . Abdomen:  soft, NT, ND, NABS . Skin:  no rash or induration seen on limited exam . Musculoskeletal:  grossly normal tone BUE/BLE, good ROM, no bony abnormality . Psychiatric:  grossly normal mood and affect, speech fluent and appropriate, AOx3 . Neurologic:  CN 2-12 grossly intact, moves all extremities in coordinated fashion    Radiological Exams on Admission: Independently reviewed - see discussion in A/P where applicable  DG Chest 1 View  Result Date: 02/26/2020 CLINICAL DATA:  Pain, fall EXAM: CHEST  1 VIEW COMPARISON:  Chest x-ray 08/22/2019 FINDINGS: The heart size and mediastinal contours are unchanged. Aortic arch calcification. Sternotomy wires well as  cardiac surgical changes overlie the mediastinum. Similar-appearing scattered calcified granuloma. No focal consolidation. No pulmonary edema. No pleural effusion. No pneumothorax. No acute osseous abnormality. Multilevel degenerative changes of the spine. IMPRESSION: No active disease. Electronically Signed   By: 10/22/2019 M.D.   On: 02/26/2020 05:13   DG Lumbar Spine Complete  Result Date: 02/26/2020 CLINICAL DATA:  Pain, fall, weakness. EXAM: LUMBAR SPINE - COMPLETE 4+ VIEW COMPARISON:  CT renal 04/08/2019 FINDINGS: Five non-rib-bearing lumbar vertebral bodies are identified. Multilevel at least moderate degenerative changes of the spine including osteophyte formation and facet arthropathy. There is no evidence of lumbar spine fracture. Markedly limited evaluation of the sacrum on frontal view due to overlying bowel gas. Alignment is normal. Intervertebral disc spaces are maintained. Severe atherosclerotic plaque of the abdominal aorta IMPRESSION: 1. No acute displaced fracture or traumatic listhesis of the lumbar spine in a patient with multilevel degenerative changes. 2.  Aortic Atherosclerosis (ICD10-I70.0). Electronically Signed   By: 04/10/2019 M.D.   On: 02/26/2020 05:17   CT Head Wo Contrast  Result Date: 02/26/2020 CLINICAL DATA:  Neck trauma. EXAM: CT HEAD WITHOUT CONTRAST CT CERVICAL SPINE WITHOUT CONTRAST TECHNIQUE: Multidetector CT imaging of the head and cervical spine was performed following the standard protocol without intravenous contrast. Multiplanar CT image reconstructions of the cervical spine were also generated. COMPARISON:  CT head 06/27/2017 FINDINGS: CT HEAD FINDINGS Brain: Cerebral ventricle sizes are concordant with the degree of cerebral volume loss. Patchy and confluent areas of decreased attenuation are noted throughout the deep and periventricular white matter of the cerebral hemispheres bilaterally, compatible with chronic microvascular ischemic disease.  Redemonstration of hypodensity within the left basal ganglia that could represent prior infarction. No evidence of large-territorial acute infarction. No parenchymal hemorrhage. No  mass lesion. No extra-axial collection. No mass effect or midline shift. No hydrocephalus. Basilar cisterns are patent. Vascular: No hyperdense vessel. Atherosclerotic calcifications are present within the cavernous internal carotid and vertebral arteries. Skull: No acute fracture or focal lesion. Sinuses/Orbits: Persistent opacification of the right mastoid cells and middle ear. Paranasal sinuses and left mastoid air cells are clear. Similar-appearing right orbit hyperdensity likely postsurgical. Lens replacement on the left. Similar-appearing slightly heterogeneous matrix on the left. Other: None. CT CERVICAL SPINE FINDINGS Alignment: Straightening of the normal cervical lordosis likely due to positioning and degenerative changes. Skull base and vertebrae: Multilevel degenerative changes of the spine. No acute fracture. No aggressive appearing focal osseous lesion or focal pathologic process. Soft tissues and spinal canal: No prevertebral fluid or swelling. No visible canal hematoma. Upper chest: Mild emphysematous changes. Other: None. IMPRESSION: 1. No acute intracranial abnormality. 2. No acute displaced fracture or traumatic listhesis of the cervical spine. Electronically Signed   By: Tish Frederickson M.D.   On: 02/26/2020 04:42   CT Cervical Spine Wo Contrast  Result Date: 02/26/2020 CLINICAL DATA:  Neck trauma. EXAM: CT HEAD WITHOUT CONTRAST CT CERVICAL SPINE WITHOUT CONTRAST TECHNIQUE: Multidetector CT imaging of the head and cervical spine was performed following the standard protocol without intravenous contrast. Multiplanar CT image reconstructions of the cervical spine were also generated. COMPARISON:  CT head 06/27/2017 FINDINGS: CT HEAD FINDINGS Brain: Cerebral ventricle sizes are concordant with the degree of cerebral  volume loss. Patchy and confluent areas of decreased attenuation are noted throughout the deep and periventricular white matter of the cerebral hemispheres bilaterally, compatible with chronic microvascular ischemic disease. Redemonstration of hypodensity within the left basal ganglia that could represent prior infarction. No evidence of large-territorial acute infarction. No parenchymal hemorrhage. No mass lesion. No extra-axial collection. No mass effect or midline shift. No hydrocephalus. Basilar cisterns are patent. Vascular: No hyperdense vessel. Atherosclerotic calcifications are present within the cavernous internal carotid and vertebral arteries. Skull: No acute fracture or focal lesion. Sinuses/Orbits: Persistent opacification of the right mastoid cells and middle ear. Paranasal sinuses and left mastoid air cells are clear. Similar-appearing right orbit hyperdensity likely postsurgical. Lens replacement on the left. Similar-appearing slightly heterogeneous matrix on the left. Other: None. CT CERVICAL SPINE FINDINGS Alignment: Straightening of the normal cervical lordosis likely due to positioning and degenerative changes. Skull base and vertebrae: Multilevel degenerative changes of the spine. No acute fracture. No aggressive appearing focal osseous lesion or focal pathologic process. Soft tissues and spinal canal: No prevertebral fluid or swelling. No visible canal hematoma. Upper chest: Mild emphysematous changes. Other: None. IMPRESSION: 1. No acute intracranial abnormality. 2. No acute displaced fracture or traumatic listhesis of the cervical spine. Electronically Signed   By: Tish Frederickson M.D.   On: 02/26/2020 04:42    EKG: Independently reviewed.  NSR with rate 71; nonspecific ST changes with no evidence of acute ischemia   Labs on Admission: I have personally reviewed the available labs and imaging studies at the time of the admission.  Pertinent labs:   Na++ 127; 136 on 08/25/19 Anion  gap 17 WBC 6.9 Hgb 12.3 CK 4 COVID pending UA: 80 ketones, moderate LE, many bacteria Urine culture pending   Assessment/Plan Principal Problem:   Fall at home, initial encounter Active Problems:   BPH (benign prostatic hyperplasia)   Hypercholesteremia   Alcohol abuse   Chronic diastolic CHF (congestive heart failure) (HCC)   Essential hypertension   CKD (chronic kidney disease) stage 3, GFR 30-59  ml/min (HCC)   Recurrent UTI   Dehydration with hyponatremia   DNR (do not resuscitate)   Fall -Patient lives independently and suffered 2 falls yesterday -His description of events appears c/w orthostasis and he has evidence of volume depletion in the ER -Levaquin may be contributing and is a suboptimal choice for him; will stop -Will admit, monitor on telemetry, and rehydrate -PT/OT consults requested  Dehydration with hyponatremia -Etiology appears to be hypovolemic hyponatremia; patient with recurrent UTI and recently started on Levaquin.   -Likely also in conjunction with beer potomania. -UA has 80 ketones, c/w dehydration -Patient had 2 falls at home yesterday, both after standing too quickly, also c/w volume depletion/orthostasis -Since his sodium is >120, he is not at significant risk from rapid correction. -Will recheck this AM -Start IVF at 75 cc/hr and recheck tomorrow -Will request nutrition consult  Recurrent UTI, indwelling foley due to BPH -He reports frequent UTIs -No urine cultures are available for review at this time -Recently started on Levaquin, which is suboptimal given his age and its vast side effect profile; will stop -For now, his urine is not overly concerning for infection so much as dehydration -Urine culture is pending -He takes a number of bladder-associated medications and these are continued - Uroxatral, bethanechol, finasteride, methenamine, oxybutynin -Consider addition of prophylactic antibiotic (in addition to  methenamine)  HTN -Continue Coreg  HLD -Continue Zetia -It is not clear why he is not taking statin at this time  CAD/Chronic diastolic CHF -h/o multiple stents plus CABG -Continue ASA, Plavix, Imdur, Ranexa -Hold lasix in the setting of volume depletion; he does have chronic LE edema for which he wears compression stockings  Stage 3a CKD -Appears to be stable at this time  ETOH abuse -Number of drinks per day: 2-4 -CIWA protocol -Folate, thiamine, and MVI ordered -Will provide symptom-triggered BZD (ativan per CIWA protocol) only since the patient is able to communicate; is not showing current signs of delirium; and has no history of severe withdrawal. -TOC consult for education  Chronic pain -I have reviewed this patient in the [place] Controlled Substances Reporting System.  He is receiving medications from only one provider and appears to be taking them as prescribed. -He is not at particularly high risk of opioid misuse, diversion, or overdose. -Takes tramadol for pain regularly and this is continued -May have increased pain given falls -Will add prn Oxy/morphine  Anxiety -Continue Xanax, melatonin  IBS/GERD -Continue Colace, Linzess -Continue Protonix  Glaucoma -Continue Cosopt, Xalatan  DNR -I have discussed code status with the patient and he would not desire resuscitation and would prefer to die a natural death should that situation arise. -He will need a new gold out of facility DNR form at the time of discharge, as his is very battered.   Note: This patient has been tested and is pending for the novel coronavirus COVID-19. The patient has NOT been vaccinated against COVID-19; he was COVID + in 12/2018.   Level of care: Telemetry Medical DVT prophylaxis:  Lovenox Code Status:  DNR - confirmed with patient Family Communication: None present Disposition Plan:  The patient is from: home  Anticipated d/c is to: home, possibly with [facility] services  Anticipated d/c  date will depend on clinical response to treatment, but possibly as early as tomorrow if he has excellent response to treatment  Patient is currently: acutely ill Consults called: PT/OT/Nutrition/TOC team Admission status:  Admit - It is my clinical opinion that admission to INPATIENT is  reasonable and necessary because of the expectation that this patient will require hospital care that crosses at least 2 midnights to treat this condition based on the medical complexity of the problems presented.  Given the aforementioned information, the predictability of an adverse outcome is felt to be significant.    Jonah Blue MD [facility]   How to contact the [facility] Attending or Consulting provider 7A - 7P or covering provider during after hours 7P -7A, for this patient?  1. Check the care team in [facility]: [facility] Rhonda W and look for a) attending/consulting [facility] provider listed and b) the [facility] team listed 2. Log into [email or web address] and use [place]'s universal password to access. If you do not have the password, please contact the hospital operator. 3. Locate the [facility] provider you are looking for under [facility] and page to a number that you can be directly reached. 4. If you still have difficulty reaching the provider, please page the [facility] (Director on Call) for the Hospitalists listed on amion for assistance.   02/26/2020, 8:08 AM

## 2020-02-26 NOTE — Progress Notes (Signed)
Pt arrived from unit from ED. CHG given, Pt has chronic indwelling catheter, pt arrived from home with skin tear on left forearm Will continue to monitor. Pt. Oriented to unit. Pt stated he lives alone and uses a walker.   02/26/20 1649  Vitals  Temp 98.2 F (36.8 C)  Temp Source Oral  BP (!) 171/59  MAP (mmHg) 91  BP Location Right Arm  BP Method Automatic  Patient Position (if appropriate) Lying  Pulse Rate (!) 58  Pulse Rate Source Monitor  Resp 20  Oxygen Therapy  SpO2 98 %  O2 Device Room Air  Patient Activity (if Appropriate) In bed  Pulse Oximetry Type Continuous  Pain Assessment  Pain Scale 0-10  Pain Score 0  MEWS Score  MEWS Temp 0  MEWS Systolic 0  MEWS Pulse 0  MEWS RR 0  MEWS LOC 0  MEWS Score 0  MEWS Score Color Green    Everlean Cherry, RN

## 2020-02-27 DIAGNOSIS — E43 Unspecified severe protein-calorie malnutrition: Secondary | ICD-10-CM

## 2020-02-27 HISTORY — DX: Unspecified severe protein-calorie malnutrition: E43

## 2020-02-27 LAB — BASIC METABOLIC PANEL
Anion gap: 8 (ref 5–15)
BUN: 14 mg/dL (ref 8–23)
CO2: 24 mmol/L (ref 22–32)
Calcium: 8.6 mg/dL — ABNORMAL LOW (ref 8.9–10.3)
Chloride: 95 mmol/L — ABNORMAL LOW (ref 98–111)
Creatinine, Ser: 0.92 mg/dL (ref 0.61–1.24)
GFR, Estimated: >60 mL/min (ref >60)
Glucose, Bld: 125 mg/dL — ABNORMAL HIGH (ref 70–99)
Potassium: 3.6 mmol/L (ref 3.5–5.1)
Sodium: 127 mmol/L — ABNORMAL LOW (ref 135–145)

## 2020-02-27 LAB — CBC
HCT: 30.3 % — ABNORMAL LOW (ref 39.0–52.0)
Hemoglobin: 11.3 g/dL — ABNORMAL LOW (ref 13.0–17.0)
MCH: 36.1 pg — ABNORMAL HIGH (ref 26.0–34.0)
MCHC: 37.3 g/dL — ABNORMAL HIGH (ref 30.0–36.0)
MCV: 96.8 fL (ref 80.0–100.0)
Platelets: 121 10*3/uL — ABNORMAL LOW (ref 150–400)
RBC: 3.13 MIL/uL — ABNORMAL LOW (ref 4.22–5.81)
RDW: 12.1 % (ref 11.5–15.5)
WBC: 5.1 10*3/uL (ref 4.0–10.5)
nRBC: 0 % (ref 0.0–0.2)

## 2020-02-27 MED ORDER — SODIUM CHLORIDE 0.9 % IV SOLN
1.0000 g | INTRAVENOUS | Status: DC
  Administered 2020-02-27 – 2020-02-29 (×3): 1 g via INTRAVENOUS
  Filled 2020-02-27: qty 10
  Filled 2020-02-27: qty 1
  Filled 2020-02-27 (×2): qty 10

## 2020-02-27 MED ORDER — SODIUM CHLORIDE 1 G PO TABS
2.0000 g | ORAL_TABLET | Freq: Two times a day (BID) | ORAL | Status: AC
  Administered 2020-02-27 – 2020-03-03 (×10): 2 g via ORAL
  Filled 2020-02-27 (×10): qty 2

## 2020-02-27 MED ORDER — CHLORHEXIDINE GLUCONATE 0.12 % MT SOLN
OROMUCOSAL | Status: AC
  Filled 2020-02-27: qty 15

## 2020-02-27 MED ORDER — HYDROCODONE-ACETAMINOPHEN 5-325 MG PO TABS
1.0000 | ORAL_TABLET | ORAL | Status: DC | PRN
  Administered 2020-02-27: 2 via ORAL
  Administered 2020-02-27: 1 via ORAL
  Administered 2020-02-28 – 2020-03-04 (×15): 2 via ORAL
  Filled 2020-02-27 (×16): qty 2
  Filled 2020-02-27: qty 1
  Filled 2020-02-27: qty 2

## 2020-02-27 MED ORDER — ENSURE ENLIVE PO LIQD
237.0000 mL | Freq: Three times a day (TID) | ORAL | Status: DC
  Administered 2020-02-27 – 2020-03-04 (×11): 237 mL via ORAL

## 2020-02-27 NOTE — Evaluation (Signed)
Occupational Therapy Evaluation Patient Details Name: Austin Malone MRN: [id number] DOB: December 10, 1940 Today's Date: 02/27/2020    History of Present Illness 80 y.o. male with medical history significant of CAD s/p stents and CABG; HTN; HLD; Glaucoma; stage 3 CKD; chronic diastolic CHF; ETOH abuse; and BPH with indwelling foley presenting with weakness and falls.  He had 2 falls yesterday and injured his back.   Clinical Impression   Pt ambulated with a walker when outside of his home, but was otherwise independent prior to admission. Presents with generalized weakness, fatigue, back pain and impaired standing balance. He requires set up to to max assist. Educated in log roll technique to minimize back pain. Recommending ST rehab in SNF prior to return home where he lives alone. Will follow acutely.    Follow Up Recommendations  SNF    Equipment Recommendations  3 in 1 bedside commode    Recommendations for Other Services       Precautions / Restrictions Precautions Precautions: Fall Restrictions Weight Bearing Restrictions: No      Mobility Bed Mobility Overal bed mobility: Needs Assistance Bed Mobility: Sidelying to Sit;Sit to Sidelying   Sidelying to sit: Min assist     Sit to sidelying: Min assist General bed mobility comments: min assist to raise trunk and for LEs back into bed    Transfers Overall transfer level: Needs assistance Equipment used: Rolling walker (2 wheeled) Transfers: Sit to/from Stand Sit to Stand: Min assist         General transfer comment: min assist to rise and steady, cues for hand placement    Balance Overall balance assessment: Needs assistance   Sitting balance-Leahy Scale: Fair     Standing balance support: Bilateral upper extremity supported Standing balance-Leahy Scale: Poor                             ADL either performed or assessed with clinical judgement   ADL Overall ADL's : Needs  assistance/impaired Eating/Feeding: Independent;Bed level   Grooming: Min guard;Standing;Wash/dry hands   Upper Body Bathing: Minimal assistance;Sitting   Lower Body Bathing: Sit to/from stand;Maximal assistance   Upper Body Dressing : Set up;Sitting   Lower Body Dressing: Sit to/from stand;Maximal assistance   Toilet Transfer: Min guard;Ambulation;RW   Toileting- Clothing Manipulation and Hygiene: Minimal assistance;Sit to/from stand       Functional mobility during ADLs: Min guard;Rolling walker       Vision Patient Visual Report: No change from baseline, h/o glaucoma       Perception     Praxis      Pertinent Vitals/Pain Pain Assessment: Faces Faces Pain Scale: Hurts little more Pain Location: back Pain Descriptors / Indicators: Aching Pain Intervention(s): Monitored during session;Repositioned     Hand Dominance Right   Extremity/Trunk Assessment Upper Extremity Assessment Upper Extremity Assessment: Overall WFL for tasks assessed   Lower Extremity Assessment Lower Extremity Assessment: Defer to PT evaluation   Cervical / Trunk Assessment Cervical / Trunk Assessment: Other exceptions Cervical / Trunk Exceptions: painful back from fall, adhered to back precautions for comfort   Communication Communication Communication: HOH   Cognition Arousal/Alertness: Awake/alert Behavior During Therapy: WFL for tasks assessed/performed Overall Cognitive Status: Within Functional Limits for tasks assessed  General Comments       Exercises     Shoulder Instructions      Home Living Family/patient expects to be discharged to:: Private residence Living Arrangements: Alone Available Help at Discharge: Family;Available PRN/intermittently Type of Home: Mobile home Home Access: Stairs to enter     Home Layout: One level     Bathroom Shower/Tub: Producer, television/film/video: Standard     Home  Equipment: Environmental consultant - 2 wheels;Cane - single point          Prior Functioning/Environment Level of Independence: Independent with assistive device(s)        Comments: pt ambulates with RW outdoors        OT Problem List: Decreased strength;Decreased activity tolerance;Impaired balance (sitting and/or standing);Decreased knowledge of use of DME or AE;Pain      OT Treatment/Interventions: Self-care/ADL training;DME and/or AE instruction;Patient/family education;Balance training;Therapeutic activities    OT Goals(Current goals can be found in the care plan section) Acute Rehab OT Goals Patient Stated Goal: get stronger OT Goal Formulation: With patient Time For Goal Achievement: 03/12/20 Potential to Achieve Goals: Good ADL Goals Pt Will Perform Grooming: with supervision;standing Pt Will Perform Lower Body Bathing: with supervision;with adaptive equipment;sit to/from stand Pt Will Perform Lower Body Dressing: with supervision;sit to/from stand;with adaptive equipment Pt Will Transfer to Toilet: with supervision;ambulating;bedside commode (over toilet) Pt Will Perform Toileting - Clothing Manipulation and hygiene: with supervision;sit to/from stand  OT Frequency: Min 2X/week   Barriers to D/C: Decreased caregiver support          Co-evaluation              AM-PAC OT "6 Clicks" Daily Activity     Outcome Measure Help from another person eating meals?: None Help from another person taking care of personal grooming?: A Little Help from another person toileting, which includes using toliet, bedpan, or urinal?: A Little Help from another person bathing (including washing, rinsing, drying)?: A Little Help from another person to put on and taking off regular upper body clothing?: A Little Help from another person to put on and taking off regular lower body clothing?: A Little 6 Click Score: 19   End of Session Equipment Utilized During Treatment: Rolling walker;Gait  belt  Activity Tolerance: Patient limited by fatigue Patient left: in bed;with call bell/phone within reach;with bed alarm set  OT Visit Diagnosis: Unsteadiness on feet (R26.81);Other abnormalities of gait and mobility (R26.89);Pain;Muscle weakness (generalized) (M62.81)                Time: 8185-6314 OT Time Calculation (min): 13 min Charges:  OT General Charges $OT Visit: 1 Visit OT Evaluation $OT Eval Moderate Complexity: 1 Mod  Austin Malone, Austin Malone Acute Rehabilitation Services Pager: [phone] Office: [phone]  Austin Malone 02/27/2020, 11:32 AM

## 2020-02-27 NOTE — Progress Notes (Signed)
PROGRESS NOTE    Austin Malone  GGY:[id number] DOB: 01/04/1941 DOA: 02/25/2020 PCP: Galvin Proffer, MD   Chief Complain: Weakness, fall  Brief Narrative: Patient is 80 year old male with history of coronary artery disease status post stents, CABG, hypertension, hyperlipidemia, glaucoma, CKD stage III, chronic diastolic CHF, alcohol abuse, BPH with indwelling Foley catheter who presented with weakness and falls.  Report of 2 falls at home and injury on the back.  He has been on chronic indwelling Foley catheter for years and currently taking levofloxacin for recent episode of UTI.  Urine culture sent and its showing significant Ecoli.  PT/OT recommended skilled nursing facility on discharge.  Patient is medically stable for discharge to skilled nursing facility as soon as bed is available.  Assessment & Plan:   Principal Problem:   Fall at home, initial encounter Active Problems:   BPH (benign prostatic hyperplasia)   Hypercholesteremia   Alcohol abuse   Chronic diastolic CHF (congestive heart failure) (HCC)   Essential hypertension   CKD (chronic kidney disease) stage 3, GFR 30-59 ml/min (HCC)   Recurrent UTI   Dehydration with hyponatremia   DNR (do not resuscitate)   Fall: Lives independently, suffered 2 falls at home.  Found to be dehydrated on presentation.  PT/OT consulted.  Recommend  skilled nursing facility on discharge.  Dehydration/hyponatremia: In the setting of recurrent UTI.  Also has history of chronic alcohol abuse( beer) so Potomania could be contributing.  We will monitor sodium level.  Currently does not look dehydrated.  Stopped IV fluids.  Most likely, poor solute intake could be contributing.  Will start on salt tablets.  Recurrent UTI/urinary retention/Foley catheter: History of frequent UTIs in the past.  Was taking levofloxacin at home, now stopped.    He is to follow-up with urology as an outpatient.  On Uroxatral, bethanechol, finasteride, thiamine,  oxybutynin.  Urine culture sent here showed significant E. coli.  Started on ceftriaxone.  We will follow final urine culture.  Hypertension: On Coreg  Hyperlipidemia: On Zetia  Coronary artery disease: Status post stents, CABG.  On Plavix, aspirin, Imdur, Ranexa.  History of chronic diastolic congestive heart failure: Takes Lasix at home.  Currently on hold.  Has chronic lower extremity edema, uses compression stockings  CKD stage IIIb: Currently stable  Chronic alcohol abuse: Counseled cessation.  Continue thiamine and folic acid.  On CIWA protocol.  Monitor for withdrawal.  Chronic pain syndrome: Continue supportive care.  Anxiety: Continue Xanax as needed  Glaucoma: Continue home medications  GERD: Continue Protonix           DVT prophylaxis: Lovenox Code Status: DNR Family Communication: Called step daughter on phone today,call not received Status is: Inpatient  Remains inpatient appropriate because:Inpatient level of care appropriate due to severity of illness   Dispo: The patient is from: Home              Anticipated d/c is to: SNF              Anticipated d/c date is: 2 days              Patient currently is medically stable to d/c.   Difficult to place patient No    Consultants: None  Procedures:None  Antimicrobials:  Anti-infectives (From admission, onward)   Start     Dose/Rate Route Frequency Ordered Stop   02/26/20 1000  methenamine (MANDELAMINE) tablet 1 g        1 g Oral Daily 02/26/20 0758  Subjective: Patient seen and examined the bedside this morning.  Hemodynamically stable.  Denies any complaints.  Objective: Vitals:   02/26/20 2302 02/26/20 2307 02/27/20 0331 02/27/20 0803  BP: (!) 178/64 (!) 154/65 (!) 153/60 (!) 137/58  Pulse: 60 63 87 67  Resp: 18 15 19 18   Temp: 97.6 F (36.4 C)  97.8 F (36.6 C) 98.6 F (37 C)  TempSrc: Oral  Oral Oral  SpO2: 93% 95% 93% 95%  Weight:   87.8 kg     Intake/Output Summary (Last  24 hours) at 02/27/2020 02/29/2020 Last data filed at 02/27/2020 0600 Gross per 24 hour  Intake 1796.03 ml  Output 1400 ml  Net 396.03 ml   Filed Weights   02/27/20 0331  Weight: 87.8 kg    Examination:  General exam: Pleasant elderly male, deconditioned, debilitated Respiratory system: Bilateral equal air entry, normal vesicular breath sounds, no wheezes or crackles  Cardiovascular system: S1 & S2 heard, RRR. No JVD, murmurs, rubs, gallops or clicks. No pedal edema. Gastrointestinal system: Abdomen is nondistended, soft and nontender. No organomegaly or masses felt. Normal bowel sounds heard. Central nervous system: Alert and oriented. No focal neurological deficits. Extremities: No edema, no clubbing ,no cyanosis Skin: No rashes, lesions or ulcers,no icterus ,no pallor GU: Foley  Data Reviewed: I have personally reviewed following labs and imaging studies  CBC: Recent Labs  Lab 02/25/20 1819 02/27/20 0055  WBC 6.9 5.1  HGB 12.3* 11.3*  HCT 35.3* 30.3*  MCV 100.6* 96.8  PLT 157 121*   Basic Metabolic Panel: Recent Labs  Lab 02/25/20 1819 02/26/20 0849 02/27/20 0055  NA 127* 131* 127*  K 4.0 4.1 3.6  CL 91* 96* 95*  CO2 19* 20* 24  GLUCOSE 76 82 125*  BUN 13 17 14   CREATININE 1.00 1.17 0.92  CALCIUM 9.2 8.8* 8.6*  MG  --  2.1  --   PHOS  --  3.5  --    GFR: Estimated Creatinine Clearance: 70.3 mL/min (by C-G formula based on SCr of 0.92 mg/dL). Liver Function Tests: Recent Labs  Lab 02/26/20 0849  AST 43*  ALT 45*  ALKPHOS 46  BILITOT 1.1  PROT 6.1*  ALBUMIN 3.3*   No results for input(s): LIPASE, AMYLASE in the last 168 hours. No results for input(s): AMMONIA in the last 168 hours. Coagulation Profile: No results for input(s): INR, PROTIME in the last 168 hours. Cardiac Enzymes: Recent Labs  Lab 02/26/20 0457  CKTOTAL 46*   BNP (last 3 results) No results for input(s): PROBNP in the last 8760 hours. HbA1C: No results for input(s): HGBA1C in the  last 72 hours. CBG: No results for input(s): GLUCAP in the last 168 hours. Lipid Profile: No results for input(s): CHOL, HDL, LDLCALC, TRIG, CHOLHDL, LDLDIRECT in the last 72 hours. Thyroid Function Tests: Recent Labs    02/26/20 0849  TSH 1.804   Anemia Panel: No results for input(s): VITAMINB12, FOLATE, FERRITIN, TIBC, IRON, RETICCTPCT in the last 72 hours. Sepsis Labs: No results for input(s): PROCALCITON, LATICACIDVEN in the last 168 hours.  Recent Results (from the past 240 hour(s))  SARS CORONAVIRUS 2 (TAT 6-24 HRS) Nasopharyngeal Nasopharyngeal Swab     Status: None   Collection Time: 02/26/20  4:53 AM   Specimen: Nasopharyngeal Swab  Result Value Ref Range Status   SARS Coronavirus 2 NEGATIVE NEGATIVE Final    Comment: (NOTE) SARS-CoV-2 target nucleic acids are NOT DETECTED.  The SARS-CoV-2 RNA is generally detectable in upper and  lower respiratory specimens during the acute phase of infection. Negative results do not preclude SARS-CoV-2 infection, do not rule out co-infections with other pathogens, and should not be used as the sole basis for treatment or other patient management decisions. Negative results must be combined with clinical observations, patient history, and epidemiological information. The expected result is Negative.  Fact Sheet for Patients: HairSlick.no  Fact Sheet for Healthcare Providers: quierodirigir.com  This test is not yet approved or cleared by the [place] FDA and  has been authorized for detection and/or diagnosis of SARS-CoV-2 by FDA under an Emergency Use Authorization (EUA). This EUA will remain  in effect (meaning this test can be used) for the duration of the COVID-19 declaration under Se ction 564(b)(1) of the Act, 21 U.S.C. section 360bbb-3(b)(1), unless the authorization is terminated or revoked sooner.  Performed at [facility], [address],  [place], [place] [address]          Radiology Studies: DG Chest 1 View  Result Date: 02/26/2020 CLINICAL DATA:  Pain, fall EXAM: CHEST  1 VIEW COMPARISON:  Chest x-ray 08/22/2019 FINDINGS: The heart size and mediastinal contours are unchanged. Aortic arch calcification. Sternotomy wires well as cardiac surgical changes overlie the mediastinum. Similar-appearing scattered calcified granuloma. No focal consolidation. No pulmonary edema. No pleural effusion. No pneumothorax. No acute osseous abnormality. Multilevel degenerative changes of the spine. IMPRESSION: No active disease. Electronically Signed   By: Tish Frederickson M.D.   On: 02/26/2020 05:13   DG Lumbar Spine Complete  Result Date: 02/26/2020 CLINICAL DATA:  Pain, fall, weakness. EXAM: LUMBAR SPINE - COMPLETE 4+ VIEW COMPARISON:  CT renal 04/08/2019 FINDINGS: Five non-rib-bearing lumbar vertebral bodies are identified. Multilevel at least moderate degenerative changes of the spine including osteophyte formation and facet arthropathy. There is no evidence of lumbar spine fracture. Markedly limited evaluation of the sacrum on frontal view due to overlying bowel gas. Alignment is normal. Intervertebral disc spaces are maintained. Severe atherosclerotic plaque of the abdominal aorta IMPRESSION: 1. No acute displaced fracture or traumatic listhesis of the lumbar spine in a patient with multilevel degenerative changes. 2.  Aortic Atherosclerosis (ICD10-I70.0). Electronically Signed   By: Tish Frederickson M.D.   On: 02/26/2020 05:17   CT Head Wo Contrast  Result Date: 02/26/2020 CLINICAL DATA:  Neck trauma. EXAM: CT HEAD WITHOUT CONTRAST CT CERVICAL SPINE WITHOUT CONTRAST TECHNIQUE: Multidetector CT imaging of the head and cervical spine was performed following the standard protocol without intravenous contrast. Multiplanar CT image reconstructions of the cervical spine were also generated. COMPARISON:  CT head 06/27/2017 FINDINGS: CT HEAD FINDINGS  Brain: Cerebral ventricle sizes are concordant with the degree of cerebral volume loss. Patchy and confluent areas of decreased attenuation are noted throughout the deep and periventricular white matter of the cerebral hemispheres bilaterally, compatible with chronic microvascular ischemic disease. Redemonstration of hypodensity within the left basal ganglia that could represent prior infarction. No evidence of large-territorial acute infarction. No parenchymal hemorrhage. No mass lesion. No extra-axial collection. No mass effect or midline shift. No hydrocephalus. Basilar cisterns are patent. Vascular: No hyperdense vessel. Atherosclerotic calcifications are present within the cavernous internal carotid and vertebral arteries. Skull: No acute fracture or focal lesion. Sinuses/Orbits: Persistent opacification of the right mastoid cells and middle ear. Paranasal sinuses and left mastoid air cells are clear. Similar-appearing right orbit hyperdensity likely postsurgical. Lens replacement on the left. Similar-appearing slightly heterogeneous matrix on the left. Other: None. CT CERVICAL SPINE FINDINGS Alignment: Straightening of the  normal cervical lordosis likely due to positioning and degenerative changes. Skull base and vertebrae: Multilevel degenerative changes of the spine. No acute fracture. No aggressive appearing focal osseous lesion or focal pathologic process. Soft tissues and spinal canal: No prevertebral fluid or swelling. No visible canal hematoma. Upper chest: Mild emphysematous changes. Other: None. IMPRESSION: 1. No acute intracranial abnormality. 2. No acute displaced fracture or traumatic listhesis of the cervical spine. Electronically Signed   By: Tish Frederickson M.D.   On: 02/26/2020 04:42   CT Cervical Spine Wo Contrast  Result Date: 02/26/2020 CLINICAL DATA:  Neck trauma. EXAM: CT HEAD WITHOUT CONTRAST CT CERVICAL SPINE WITHOUT CONTRAST TECHNIQUE: Multidetector CT imaging of the head and  cervical spine was performed following the standard protocol without intravenous contrast. Multiplanar CT image reconstructions of the cervical spine were also generated. COMPARISON:  CT head 06/27/2017 FINDINGS: CT HEAD FINDINGS Brain: Cerebral ventricle sizes are concordant with the degree of cerebral volume loss. Patchy and confluent areas of decreased attenuation are noted throughout the deep and periventricular white matter of the cerebral hemispheres bilaterally, compatible with chronic microvascular ischemic disease. Redemonstration of hypodensity within the left basal ganglia that could represent prior infarction. No evidence of large-territorial acute infarction. No parenchymal hemorrhage. No mass lesion. No extra-axial collection. No mass effect or midline shift. No hydrocephalus. Basilar cisterns are patent. Vascular: No hyperdense vessel. Atherosclerotic calcifications are present within the cavernous internal carotid and vertebral arteries. Skull: No acute fracture or focal lesion. Sinuses/Orbits: Persistent opacification of the right mastoid cells and middle ear. Paranasal sinuses and left mastoid air cells are clear. Similar-appearing right orbit hyperdensity likely postsurgical. Lens replacement on the left. Similar-appearing slightly heterogeneous matrix on the left. Other: None. CT CERVICAL SPINE FINDINGS Alignment: Straightening of the normal cervical lordosis likely due to positioning and degenerative changes. Skull base and vertebrae: Multilevel degenerative changes of the spine. No acute fracture. No aggressive appearing focal osseous lesion or focal pathologic process. Soft tissues and spinal canal: No prevertebral fluid or swelling. No visible canal hematoma. Upper chest: Mild emphysematous changes. Other: None. IMPRESSION: 1. No acute intracranial abnormality. 2. No acute displaced fracture or traumatic listhesis of the cervical spine. Electronically Signed   By: Tish Frederickson M.D.   On:  02/26/2020 04:42        Scheduled Meds: . alfuzosin  10 mg Oral Daily  . aspirin EC  81 mg Oral Daily  . bethanechol  50 mg Oral TID  . carvedilol  6.25 mg Oral BID WC  . Chlorhexidine Gluconate Cloth  6 each Topical Daily  . clopidogrel  75 mg Oral Daily  . docusate sodium  100 mg Oral BID  . dorzolamide-timolol  1 drop Both Eyes BID  . enoxaparin (LOVENOX) injection  40 mg Subcutaneous Daily  . ezetimibe  10 mg Oral Daily  . finasteride  5 mg Oral Daily  . folic acid  1 mg Oral Daily  . isosorbide mononitrate  60 mg Oral Daily  . latanoprost  1 drop Both Eyes QPM  . linaclotide  290 mcg Oral QAC breakfast  . melatonin  10 mg Oral QHS  . methenamine  1 g Oral Daily  . multivitamin with minerals  1 tablet Oral Daily  . oxybutynin  10 mg Oral Daily  . pantoprazole  40 mg Oral BID  . ranolazine  1,000 mg Oral BID  . sodium chloride flush  3 mL Intravenous Q12H  . thiamine  100 mg Oral Daily  Or  . thiamine  100 mg Intravenous Daily   Continuous Infusions: . lactated ringers 75 mL/hr at 02/26/20 2231     LOS: 1 day    Time spent:35 mins. More than 50% of that time was spent in counseling and/or coordination of care.      Burnadette PopAmrit Coni Homesley, MD [facility] P2/14/2022, 8:19 AM

## 2020-02-27 NOTE — Progress Notes (Signed)
Mobility Specialist - Progress Note   02/27/20 1436  Mobility  Activity Ambulated in hall  Level of Assistance Moderate assist, patient does 50-74%  Assistive Device Front wheel walker  Distance Ambulated (ft) 50 ft  Mobility Response Tolerated well  Mobility performed by Mobility specialist  $Mobility charge 1 Mobility   Pre-mobility: 65 HR, 99% SpO2 During mobility: 72 HR Post-mobility: 66 HR, 98% SpO2  Pt mod assist for bed mobility, min assist to stand. He c/o back pain he rated a 7/10, but stated it relieved once he was laying back in bed. Min assist to lift legs when laying back in bed.   Mamie Levers Mobility Specialist Mobility Specialist Phone: [phone]

## 2020-02-27 NOTE — Progress Notes (Signed)
Initial Nutrition Assessment  DOCUMENTATION CODES:   Severe malnutrition in context of social or environmental circumstances  INTERVENTION:    Ensure Enlive po TID, each supplement provides 350 kcal and 20 grams of protein  MVI with minerals daily  NUTRITION DIAGNOSIS:   Severe Malnutrition related to social / environmental circumstances (ETOH abuse) as evidenced by mild fat depletion,mild muscle depletion,moderate muscle depletion.  GOAL:   Patient will meet greater than or equal to 90% of their needs  MONITOR:   PO intake,Supplement acceptance,Labs,Skin  REASON FOR ASSESSMENT:   Malnutrition Screening Tool,Consult Assessment of nutrition requirement/status  ASSESSMENT:   80 yo male admitted with weakness, fall x 2 at home, hyponatremia, dehydration. PMH includes CAD, HTN, HLD, glaucoma, CKD stage 3, CHF, ETOH abuse, BPH, indwelling foley catheter.  Patient with recurrent UTI (on Levaquin PTA). Antibiotic thought to be possibly contributing to acute symptoms, so this was changed. Beer potomania also likely contributing to hypovolemia/dehydration.  Patient reports good intake prior to this weekend. He thinks his usual weight is ~175 lbs, unsure if he has lost weight. Since admission, he has been wanting to eat only fruit plates. He agreed to receiving PO supplements between meals. He is concerned that the supplements will cause him to gain weight. Explained that the supplements will help him retain his lean body mass by providing essential nutrients.   Labs reviewed. Sodium 127  Medications reviewed and include Colace, folic acid, Linzess, MVI with minerals, thiamine, IV Rocephin.  Weight history reviewed. Current weight 87.8 kg above last weight on 80.8 kg on 11/14/19.   NUTRITION - FOCUSED PHYSICAL EXAM:  Flowsheet Row Most Recent Value  Orbital Region Mild depletion  Upper Arm Region No depletion  Thoracic and Lumbar Region Mild depletion  Buccal Region No  depletion  Temple Region Mild depletion  Clavicle Bone Region Mild depletion  Clavicle and Acromion Bone Region Mild depletion  Scapular Bone Region No depletion  Dorsal Hand Mild depletion  Patellar Region Moderate depletion  Anterior Thigh Region Moderate depletion  Posterior Calf Region Moderate depletion  Edema (RD Assessment) Mild  Hair Reviewed  Eyes Reviewed  Mouth Reviewed  Skin Reviewed  Nails Reviewed       Diet Order:   Diet Order            Diet Heart Room service appropriate? Yes; Fluid consistency: Thin  Diet effective now                 EDUCATION NEEDS:   Education needs have been addressed  Skin:  Skin Assessment: Reviewed RN Assessment  Last BM:  2/12  Height:   Ht Readings from Last 1 Encounters:  02/27/20 5\' 10"  (1.778 m)    Weight:   Wt Readings from Last 1 Encounters:  02/27/20 87.8 kg    Ideal Body Weight:  75.5 kg  BMI:  Body mass index is 27.77 kg/m.  Estimated Nutritional Needs:   Kcal:  2100-2300  Protein:  120-140 gm  Fluid:  2.1-2.3 L    02/29/20, RD, LDN, CNSC Please refer to Amion for contact information.

## 2020-02-28 LAB — URINE CULTURE: Culture: >=100000 — AB

## 2020-02-28 LAB — BASIC METABOLIC PANEL
Anion gap: 7 (ref 5–15)
BUN: 14 mg/dL (ref 8–23)
CO2: 26 mmol/L (ref 22–32)
Calcium: 8.4 mg/dL — ABNORMAL LOW (ref 8.9–10.3)
Chloride: 98 mmol/L (ref 98–111)
Creatinine, Ser: 0.92 mg/dL (ref 0.61–1.24)
GFR, Estimated: >60 mL/min (ref >60)
Glucose, Bld: 144 mg/dL — ABNORMAL HIGH (ref 70–99)
Potassium: 4.1 mmol/L (ref 3.5–5.1)
Sodium: 131 mmol/L — ABNORMAL LOW (ref 135–145)

## 2020-02-28 NOTE — NC FL2 (Signed)
[place] MEDICAID FL2 LEVEL OF CARE SCREENING TOOL     IDENTIFICATION  Patient Name: Austin Malone Birthdate: June 23, 1940 Sex: male Admission Date (Current Location): 02/25/2020  [facility] and [place] Number:  [employer] and Address:  [facility] [place], [address], [place], [place] [address]      Provider Number: [id number]  Attending Physician Name and Address:  Burnadette Pop, MD  Relative Name and Phone Number:       Current Level of Care: Hospital Recommended Level of Care: Skilled Nursing Facility Prior Approval Number:    Date Approved/Denied:   PASRR Number: [id number]  Discharge Plan: SNF    Current Diagnoses: Patient Active Problem List   Diagnosis Date Noted  . Protein-calorie malnutrition, severe 02/27/2020  . Dehydration with hyponatremia 02/26/2020  . Fall at home, initial encounter 02/26/2020  . DNR (do not resuscitate) 02/26/2020  . Arthritis   . CAD (coronary artery disease)   . Cataracts, bilateral   . Enlarged prostate   . Glaucoma   . GERD (gastroesophageal reflux disease)   . Hypertension   . Pneumonia   . Rotator cuff tear   . PUD (peptic ulcer disease)   . Status post coronary artery stent placement   . Unstable angina (HCC) 08/22/2019  . Orthostatic hypotension 04/10/2019  . Nausea & vomiting 04/08/2019  . CKD (chronic kidney disease) stage 3, GFR 30-59 ml/min (HCC) 04/08/2019  . 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 UTI (urinary tract infection) 03/2019  . Hyperglycemia 10/23/2018  . Preoperative cardiovascular examination 09/17/2018  . Chest pain 05/23/2018  . Essential hypertension 03/24/2018  . Upper GI bleed 02/19/2018  . Normocytic anemia, not due to blood loss 02/19/2018  . AKI (acute kidney injury) (HCC) 02/19/2018  . Pericarditis 02/03/2018  . Chronic diastolic CHF (congestive heart failure) (HCC) 01/31/2018  . Chest pain at rest 09/29/2017  . Pre-operative clearance 09/08/2017  . Alcohol abuse  08/22/2017  . Anxiety 08/22/2017  . Non-ST elevation (NSTEMI) myocardial infarction (HCC) 08/21/2017  . BPH (benign prostatic hyperplasia) 07/21/2017  . Gastroesophageal reflux disease without esophagitis 07/21/2017  . 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 Hypercholesteremia 07/21/2017  . 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 Gallbladder sludge 04/01/2015  . Abnormal myocardial perfusion study 04/01/2015  . Coronary artery disease involving coronary bypass graft of native heart with unstable angina pectoris (HCC) 06/17/2014  . CAD in native artery 06/07/2014    Orientation RESPIRATION BLADDER Height & Weight     Self,Time,Situation,Place  Normal Continent Weight: 198 lb 10.2 oz (90.1 kg) Height:  5\' 10"  (177.8 cm) (per provious records)  BEHAVIORAL SYMPTOMS/MOOD NEUROLOGICAL BOWEL NUTRITION STATUS      Continent Diet (please see discharge summary)  AMBULATORY STATUS COMMUNICATION OF NEEDS Skin   Supervision Verbally Normal                       Personal Care Assistance Level of Assistance  Bathing,Feeding,Dressing Bathing Assistance: Limited assistance Feeding assistance: Independent Dressing Assistance: Limited assistance     Functional Limitations Info  Sight,Hearing,Speech Sight Info: 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 acetaminophen (TYLENOL) tablet 650 mg  650 mg Oral Q6H PRN Jonah Blue,  MD       Or  . acetaminophen (TYLENOL) suppository 650 mg  650 mg Rectal Q6H PRN Jonah Blue, MD      . alfuzosin (UROXATRAL) 24 hr tablet 10 mg  10 mg Oral Daily Jonah Blue, MD   10 mg at 02/28/20 0953  . ALPRAZolam Prudy Feeler) tablet 0.5 mg  0.5 mg Oral BID PRN Jonah Blue, MD   0.5 mg at 02/28/20 1336  . aspirin EC tablet 81 mg  81 mg Oral Daily Jonah Blue, MD   81 mg at 02/28/20 0947  . bethanechol (URECHOLINE) tablet 50 mg  50 mg Oral TID Jonah Blue, MD   50 mg at 02/28/20 0948  . bisacodyl (DULCOLAX) EC tablet 5 mg  5 mg Oral Daily PRN Jonah Blue, MD      . carvedilol (COREG) tablet 6.25 mg  6.25 mg Oral BID WC Jonah Blue, MD   6.25 mg at 02/28/20 0947  . cefTRIAXone (ROCEPHIN) 1 g in sodium chloride 0.9 % 100 mL IVPB  1 g Intravenous Q24H Adhikari, Amrit, MD 200 mL/hr at 02/28/20 1327 1 g at 02/28/20 1327  . Chlorhexidine Gluconate Cloth 2 % PADS 6 each  6 each Topical Daily Chotiner, Claudean Severance, MD   6 each at 02/27/20 [phone]  . clopidogrel (PLAVIX) tablet 75 mg  75 mg Oral Daily Jonah Blue, MD   75 mg at 02/28/20 0949  . docusate sodium (COLACE) capsule 100 mg  100 mg Oral BID Jonah Blue, MD   100 mg at 02/28/20 0954  . dorzolamide-timolol (COSOPT) 22.3-6.8 MG/ML ophthalmic solution 1 drop  1 drop Both Eyes BID Jonah Blue, MD   1 drop at 02/28/20 1331  . enoxaparin (LOVENOX) injection 40 mg  40 mg Subcutaneous Daily Jonah Blue, MD   40 mg at 02/28/20 0940  . ezetimibe (ZETIA) tablet 10 mg  10 mg Oral Daily Jonah Blue, MD   10 mg at 02/27/20 0846  . feeding supplement (ENSURE ENLIVE / ENSURE PLUS) liquid 237 mL  237 mL Oral TID BM Adhikari, Amrit, MD   237 mL at 02/27/20 1535  . finasteride (PROSCAR) tablet 5 mg  5 mg Oral Daily Jonah Blue, MD   5 mg at 02/28/20 0948  . folic acid (FOLVITE) tablet 1 mg  1 mg Oral Daily Jonah Blue, MD   1 mg at 02/28/20 0949  . hydrALAZINE (APRESOLINE) injection 5 mg  5 mg Intravenous Q4H PRN  Jonah Blue, MD   5 mg at 02/26/20 2302  . HYDROcodone-acetaminophen (NORCO/VICODIN) 5-325 MG per tablet 1-2 tablet  1-2 tablet Oral Q4H PRN Burnadette Pop, MD   2 tablet at 02/28/20 1304  . isosorbide mononitrate (IMDUR) 24 hr tablet 60 mg  60 mg Oral Daily Jonah Blue, MD   60 mg at 02/28/20 0946  . latanoprost (XALATAN) 0.005 % ophthalmic solution 1 drop  1 drop Both Eyes QPM Jonah Blue, MD   1 drop at 02/27/20 1724  . linaclotide (LINZESS) capsule 290 mcg  290 mcg Oral QAC breakfast Jonah Blue, MD   290 mcg at 02/27/20 0850  . LORazepam (ATIVAN) tablet 1-4 mg  1-4  mg Oral Q1H PRN Jonah Blue, MD   1 mg at 02/26/20 1555   Or  . LORazepam (ATIVAN) injection 1-4 mg  1-4 mg Intravenous Q1H PRN Jonah Blue, MD      . melatonin tablet 10 mg  10 mg Oral Noemi Chapel, MD   10 mg at 02/27/20 2006  . morphine 2 MG/ML injection 2 mg  2 mg Intravenous Q2H PRN Jonah Blue, MD      . multivitamin with minerals tablet 1 tablet  1 tablet Oral Daily Jonah Blue, MD   1 tablet at 02/28/20 0941  . ondansetron (ZOFRAN) tablet 4 mg  4 mg Oral Q6H PRN Jonah Blue, MD   4 mg at 02/26/20 1554   Or  . ondansetron [facility]) injection 4 mg  4 mg Intravenous Q6H PRN Jonah Blue, MD   4 mg at 02/27/20 2253  . oxybutynin (DITROPAN-XL) 24 hr tablet 10 mg  10 mg Oral Daily Jonah Blue, MD   10 mg at 02/28/20 0953  . pantoprazole (PROTONIX) EC tablet 40 mg  40 mg Oral BID Jonah Blue, MD   40 mg at 02/28/20 0947  . polyethylene glycol (MIRALAX / GLYCOLAX) packet 17 g  17 g Oral Daily PRN Jonah Blue, MD      . ranolazine (RANEXA) 12 hr tablet 1,000 mg  1,000 mg Oral BID Jonah Blue, MD   1,000 mg at 02/28/20 0955  . sodium chloride flush (NS) 0.9 % injection 3 mL  3 mL Intravenous Q12H Jonah Blue, MD   3 mL at 02/27/20 0855  . sodium chloride tablet 2 g  2 g Oral BID WC Burnadette Pop, MD   2 g at 02/28/20 0950  . thiamine tablet 100 mg  100 mg Oral Daily  Jonah Blue, MD   100 mg at 02/28/20 8250   Or  . thiamine (B-1) injection 100 mg  100 mg Intravenous Daily Jonah Blue, MD      . traMADol Janean Sark) tablet 50 mg  50 mg Oral BID PRN Jonah Blue, MD         Discharge Medications: Please see discharge summary for a list of discharge medications.  Relevant Imaging Results:  Relevant Lab Results:   Additional Information SSN [id number]  patient has NOT received covid vaccine  Eduard Roux, LCSWA

## 2020-02-28 NOTE — TOC Progression Note (Signed)
Transition of Care [facility]) - Progression Note    Patient Details  Name: MALIKHI OGAN MRN: [id number] Date of Birth: 06-Aug-1940  [facility]) CM/SW Contact  Eduard Roux, [place] Phone Number: 02/28/2020, 5:10 PM  Clinical Narrative:     Harlene Ramus Rosalita Levan informed CSW - insurance is not in network. CSW contacted [facility] -SNF will start insurance authorization & they  will not have a bed until the end of the week. Patient will need insurance auth before d/c to SNF. CSW called patient and provided update.   CSW will continue to follow and assist with discharge planning.   Antony Blackbird, MSW, LCSW Clinical Social Worker   Expected Discharge Plan: Skilled Nursing Facility Barriers to Discharge: Insurance Authorization,SNF Pending bed offer  Expected Discharge Plan and Services Expected Discharge Plan: Skilled Nursing Facility In-house Referral: Clinical Social Work     Living arrangements for the past 2 months: Single Family Home                                       Social Determinants of Health (SDOH) Interventions    Readmission Risk Interventions No flowsheet data found.

## 2020-02-28 NOTE — TOC Initial Note (Signed)
Transition of Care [facility]) - Initial/Assessment Note    Patient Details  Name: Austin Malone MRN: [id number] Date of Birth: Nov 22, 1940  [facility]) CM/SW Contact:    Eduard Roux, LCSWA Phone Number: 02/28/2020, 2:52 PM  Clinical Narrative:                  CSW visit patient at bedside. CSW introduced self and explained role. CSW discussed with patient therapy recommendation of short term rehab at [facility].  Patient states he lives home alone. Patient acknowledges the need for rehab and was agreeable to recommendations. Patient states SN[facility] preference [facility] or [facility]. CSW explained the SNF process. Patient states he has not received covid vaccines. Patient states no questions or concerns at this time.   CSW will provide bed offers once available.  Antony Blackbird, MSW, LCSW Clinical Social Worker     Expected Discharge Plan: Skilled Nursing Facility Barriers to Discharge: Insurance Authorization,SNF Pending bed offer   Patient Goals and CMS Choice        Expected Discharge Plan and Services Expected Discharge Plan: Skilled Nursing Facility In-house Referral: Clinical Social Work     Living arrangements for the past 2 months: Single Family Home                                      Prior Living Arrangements/Services Living arrangements for the past 2 months: Single Family Home Lives with:: Self Patient language and need for interpreter reviewed:: No        Need for Family Participation in Patient Care: Yes (Comment) Care giver support system in place?: Yes (comment)   Criminal Activity/Legal Involvement Pertinent to Current Situation/Hospitalization: No - Comment as needed  Activities of Daily Living      Permission Sought/Granted Permission sought to share information with : Family Supports Permission granted to share information with : Yes, Verbal Permission Granted  Share Information with NAME: Just  Lelon Perla  Permission granted to share info w AGENCY: SNFs  Permission granted to share info w Relationship: step daugter  Permission granted to share info w Contact Information: [phone]  Emotional Assessment Appearance:: Appears stated age Attitude/Demeanor/Rapport: Engaged Affect (typically observed): Accepting,Appropriate Orientation: : Oriented to Self,Oriented to Place,Oriented to  Time,Oriented to Situation Alcohol / Substance Use: Not Applicable Psych Involvement: No (comment)  Admission diagnosis:  Dehydration [E86.0] Hyponatremia [E87.1] Weakness [R53.1] Concussion without loss of consciousness, initial encounter [S06.0X0A] Strain of neck muscle, initial encounter [S16.1XXA] Fall, initial encounter [id number]] Strain of lumbar region, initial encounter [S39.012A] Patient Active Problem List   Diagnosis Date Noted  . Protein-calorie malnutrition, severe 02/27/2020  . Dehydration with hyponatremia 02/26/2020  . Fall at home, initial encounter 02/26/2020  . DNR (do not resuscitate) 02/26/2020  . Arthritis   . CAD (coronary artery disease)   . Cataracts, bilateral   . Enlarged prostate   . Glaucoma   . GERD (gastroesophageal reflux disease)   . Hypertension   . Pneumonia   . Rotator cuff tear   . PUD (peptic ulcer disease)   . Status post coronary artery stent placement   . Unstable angina (HCC) 08/22/2019  . Orthostatic hypotension 04/10/2019  . Nausea & vomiting 04/08/2019  . CKD (chronic kidney disease) stage 3, GFR 30-59 ml/min (HCC) 04/08/2019  . 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 UTI (urinary tract infection) 03/2019  . Hyperglycemia 10/23/2018  . Preoperative  cardiovascular examination 09/17/2018  . Chest pain 05/23/2018  . Essential hypertension 03/24/2018  . Upper GI bleed 02/19/2018  . Normocytic anemia, not due to blood loss 02/19/2018  . AKI (acute kidney injury) (HCC) 02/19/2018  . Pericarditis 02/03/2018  . Chronic diastolic CHF (congestive heart  failure) (HCC) 01/31/2018  . Chest pain at rest 09/29/2017  . Pre-operative clearance 09/08/2017  . Alcohol abuse 08/22/2017  . Anxiety 08/22/2017  . Non-ST elevation (NSTEMI) myocardial infarction (HCC) 08/21/2017  . BPH (benign prostatic hyperplasia) 07/21/2017  . Gastroesophageal reflux disease without esophagitis 07/21/2017  . 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 Hypercholesteremia 07/21/2017  . 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 Gallbladder sludge 04/01/2015  . Abnormal myocardial perfusion study 04/01/2015  . Coronary artery disease involving coronary bypass graft of native heart with unstable angina pectoris (HCC) 06/17/2014  . CAD in native artery 06/07/2014   PCP:  Galvin Proffer, MD Pharmacy:   [facility] - [place], [place] - [address] [place] [address] [place] [address] Phone: [phone] Fax: [phone]  [facility] - [place], [place] - [address] [place] [address] Phone: [phone] Fax: [phone]     Social Determinants of Health (SDOH) Interventions    Readmission Risk Interventions No flowsheet data found.

## 2020-02-28 NOTE — Progress Notes (Signed)
Physical Therapy Treatment Patient Details Name: Austin Malone MRN: [id number] DOB: 1940-09-06 Today's Date: 02/28/2020    History of Present Illness 79 y.o. male with medical history significant of CAD s/p stents and CABG; HTN; HLD; Glaucoma; stage 3 CKD; chronic diastolic CHF; ETOH abuse; and BPH with indwelling foley presenting with weakness and falls.  He had 2 falls 02/24/20 and injured his back.    PT Comments    Pt supine on arrival, agreeable to therapy session, with good participation and tolerance for mobility. Pt slow to initiate/perform mobility tasks and remains limited due to continued back pain, reviewed supine BLE HEP and log rolling, pt will need reinforcement. Pt performed household distance gait task in room but fatigues quickly and needs min/modA for transfers. Pt with nausea limiting, unable to tolerate hallway ambulation but agreeable to sit up in chair at end of session. Will plan to assess hallway ambulation next session with chair follow. Pt continues to benefit from PT services to progress toward functional mobility goals. Continue to recommend SNF.   Follow Up Recommendations  SNF (assistance for all mobility)     Equipment Recommendations  None recommended by PT    Recommendations for Other Services       Precautions / Restrictions Precautions Precautions: Fall Precaution Comments: significant back pain Restrictions Weight Bearing Restrictions: No    Mobility  Bed Mobility Overal bed mobility: Needs Assistance Bed Mobility: Sidelying to Sit;Rolling Rolling: Min guard Sidelying to sit: Min assist       General bed mobility comments: use of bed rails, cues for sequencing    Transfers Overall transfer level: Needs assistance Equipment used: Rolling walker (2 wheeled) Transfers: Sit to/from Stand Sit to Stand: Min assist;Mod assist         General transfer comment: min assist to rise and steady from EOB, cues for hand placement; modA from  low toilet seat height to RW  Ambulation/Gait Ambulation/Gait assistance: Min guard Gait Distance (Feet): 20 Feet (x2 to/from toilet) Assistive device: Rolling walker (2 wheeled) Gait Pattern/deviations: Step-to pattern;Trunk flexed Gait velocity: reduced   General Gait Details: pt with slowed step-to gait, reduced gait speed   Stairs             Wheelchair Mobility    Modified Rankin (Stroke Patients Only)       Balance Overall balance assessment: Needs assistance Sitting-balance support: Feet supported;No upper extremity supported Sitting balance-Leahy Scale: Fair     Standing balance support: Bilateral upper extremity supported Standing balance-Leahy Scale: Poor Standing balance comment: reliant on UE support of RW                            Cognition Arousal/Alertness: Awake/alert Behavior During Therapy: WFL for tasks assessed/performed Overall Cognitive Status: No family/caregiver present to determine baseline cognitive functioning                                 General Comments: slow processing at times, slow to initiate tasks but good following of 1-step commands      Exercises General Exercises - Lower Extremity Ankle Circles/Pumps: AROM;Both;20 reps;Supine Quad Sets: AROM;Both;15 reps;Supine Gluteal Sets: AROM;Both;15 reps;Supine    General Comments General comments (skin integrity, edema, etc.): pt c/o dizziness with positional changes and nausea post-ambulation, BP 148/56 (81) seated EOB and BP 145/75 (95) seated in chair after ambulation; couldn't check standing BP as  pt in a hurry to get to toilet, try full orthostatics next session?; SpO2 WNL on RA and HR 65 resting, 72 bpm seated/with mobility      Pertinent Vitals/Pain Pain Assessment: Faces Faces Pain Scale: Hurts even more Pain Location: back Pain Descriptors / Indicators: Aching Pain Intervention(s): Monitored during session;Repositioned (log roll for bed  mobility)    Home Living                      Prior Function            PT Goals (current goals can now be found in the care plan section) Acute Rehab PT Goals Patient Stated Goal: get stronger PT Goal Formulation: With patient Time For Goal Achievement: 03/11/20 Potential to Achieve Goals: Good Progress towards PT goals: Progressing toward goals (slow progress)    Frequency    Min 3X/week (3x to see if possible to progress to home)      PT Plan Current plan remains appropriate    Co-evaluation              AM-PAC PT "6 Clicks" Mobility   Outcome Measure  Help needed turning from your back to your side while in a flat bed without using bedrails?: A Little Help needed moving from lying on your back to sitting on the side of a flat bed without using bedrails?: A Little Help needed moving to and from a bed to a chair (including a wheelchair)?: A Little Help needed standing up from a chair using your arms (e.g., wheelchair or bedside chair)?: A Little Help needed to walk in hospital room?: A Little Help needed climbing 3-5 steps with a railing? : A Lot 6 Click Score: 17    End of Session Equipment Utilized During Treatment: Gait belt Activity Tolerance: Patient tolerated treatment well Patient left: in chair;with call bell/phone within reach;with chair alarm set Nurse Communication: Mobility status PT Visit Diagnosis: Unsteadiness on feet (R26.81);Other abnormalities of gait and mobility (R26.89);Muscle weakness (generalized) (M62.81);History of falling (Z91.81)     Time: 3762-8315 PT Time Calculation (min) (ACUTE ONLY): 34 min  Charges:  $Gait Training: 8-22 mins $Therapeutic Activity: 8-22 mins                     Carri Spillers P., PTA Acute Rehabilitation Services Pager: [phone] Office: [phone]   Angus Palms 02/28/2020, 10:48 AM

## 2020-02-28 NOTE — Progress Notes (Signed)
PROGRESS NOTE    Austin Malone  OMV:[id number] DOB: 03-May-1940 DOA: 02/25/2020 PCP: Galvin Proffer, MD   Chief Complain: Weakness, fall  Brief Narrative: Patient is 80 year old male with history of coronary artery disease status post stents, CABG, hypertension, hyperlipidemia, glaucoma, CKD stage III, chronic diastolic CHF, alcohol abuse, BPH with indwelling Foley catheter who presented with weakness and falls.  Report of 2 falls at home and injury on the back.  He has been on chronic indwelling Foley catheter for years and currently taking levofloxacin for recent episode of UTI.  Urine culture sent and its showing significant Ecoli.  PT/OT recommended skilled nursing facility on discharge.  Patient is medically stable for discharge to skilled nursing facility as soon as bed is available.  Assessment & Plan:   Principal Problem:   Fall at home, initial encounter Active Problems:   BPH (benign prostatic hyperplasia)   Hypercholesteremia   Alcohol abuse   Chronic diastolic CHF (congestive heart failure) (HCC)   Essential hypertension   CKD (chronic kidney disease) stage 3, GFR 30-59 ml/min (HCC)   Recurrent UTI   Dehydration with hyponatremia   DNR (do not resuscitate)   Protein-calorie malnutrition, severe   Fall: Lives independently, suffered 2 falls at home.  Found to be dehydrated on presentation.  PT/OT consulted.  Recommend  skilled nursing facility on discharge.  Dehydration/hyponatremia: In the setting of recurrent UTI.  Also has history of chronic alcohol abuse( beer) so Potomania could be contributing.  We will monitor sodium level.  Currently does not look dehydrated.  Stopped IV fluids.  Most likely, poor solute intake could be contributing.  Started  on salt tablets.  Recurrent UTI/urinary retention/Foley catheter: History of frequent UTIs in the past.  Was taking levofloxacin at home, now stopped.    He is to follow-up with urology as an outpatient.  On Uroxatral,  bethanechol, finasteride, oxybutynin.  Urine culture sent here showed significant E. Coli sensitive to ceftriaxone. Currently  on ceftriaxone,aim for 5 days course. Continue IV antibiotics till discharge. We can change antibiotics to oral if he is discharged.  Hypertension: On Coreg  Hyperlipidemia: On Zetia  Coronary artery disease: Status post stents, CABG.  On Plavix, aspirin, Imdur, Ranexa.  History of chronic diastolic congestive heart failure: Takes Lasix at home.  Currently on hold.  Has chronic lower extremity edema, uses compression stockings  CKD stage IIIb: Currently stable  Chronic alcohol abuse: Counseled cessation.  Continue thiamine and folic acid.  On CIWA protocol.  Monitor for withdrawal.  Chronic pain syndrome: Continue supportive care.  Anxiety: Continue Xanax as needed  Glaucoma: Continue home medications  GERD: Continue Protonix    Nutrition Problem: Severe Malnutrition Etiology: social / environmental circumstances (ETOH abuse)      DVT prophylaxis: Lovenox Code Status: DNR Family Communication: Called step daughter on phone on 02/28/20 Status is: Inpatient  Remains inpatient appropriate because:Inpatient level of care appropriate due to severity of illness   Dispo: The patient is from: Home              Anticipated d/c is to: SNF              Anticipated d/c date is:As soon as bed is available              Patient currently is medically stable to d/c.   Difficult to place patient No    Consultants: None  Procedures:None  Antimicrobials:  Anti-infectives (From admission, onward)   Start  Dose/Rate Route Frequency Ordered Stop   02/27/20 1215  cefTRIAXone (ROCEPHIN) 1 g in sodium chloride 0.9 % 100 mL IVPB        1 g 200 mL/hr over 30 Minutes Intravenous Every 24 hours 02/27/20 1127     02/26/20 1000  methenamine (MANDELAMINE) tablet 1 g        1 g Oral Daily 02/26/20 0758        Subjective:  Patient seen and examined at the  bedside this morning. Hemodynamically stable. Denies any complaints.  Objective: Vitals:   02/27/20 2315 02/27/20 2330 02/27/20 2345 02/28/20 0419  BP:    (!) 117/44  Pulse:    65  Resp:    18  Temp:    97.8 F (36.6 C)  TempSrc:    Oral  SpO2: (S) (!) 86% (S) (!) 82% (S) 96% 99%  Weight:    90.1 kg  Height:        Intake/Output Summary (Last 24 hours) at 02/28/2020 3893 Last data filed at 02/28/2020 0423 Gross per 24 hour  Intake 590 ml  Output 1100 ml  Net -510 ml   Filed Weights   02/27/20 0331 02/28/20 0419  Weight: 87.8 kg 90.1 kg    Examination:  General exam: Appears calm and comfortable ,Not in distress,average built, pleasant elderly male HEENT:PERRL,Oral mucosa moist, Ear/Nose normal on gross exam Respiratory system: Bilateral equal air entry, normal vesicular breath sounds, no wheezes or crackles  Cardiovascular system: S1 & S2 heard, RRR. No JVD, murmurs, rubs, gallops or clicks. Gastrointestinal system: Abdomen is nondistended, soft and nontender. No organomegaly or masses felt. Normal bowel sounds heard. Central nervous system: Alert and oriented. No focal neurological deficits. Extremities: No edema, no clubbing ,no cyanosis Skin: No rashes, lesions or ulcers,no icterus ,no pallor GU: FOley  Data Reviewed: I have personally reviewed following labs and imaging studies  CBC: Recent Labs  Lab 02/25/20 1819 02/27/20 0055  WBC 6.9 5.1  HGB 12.3* 11.3*  HCT 35.3* 30.3*  MCV 100.6* 96.8  PLT 157 121*   Basic Metabolic Panel: Recent Labs  Lab 02/25/20 1819 02/26/20 0849 02/27/20 0055 02/28/20 0102  NA 127* 131* 127* 131*  K 4.0 4.1 3.6 4.1  CL 91* 96* 95* 98  CO2 19* 20* 24 26  GLUCOSE 76 82 125* 144*  BUN 13 17 14 14   CREATININE 1.00 1.17 0.92 0.92  CALCIUM 9.2 8.8* 8.6* 8.4*  MG  --  2.1  --   --   PHOS  --  3.5  --   --    GFR: Estimated Creatinine Clearance: 71.1 mL/min (by C-G formula based on SCr of 0.92 mg/dL). Liver Function  Tests: Recent Labs  Lab 02/26/20 0849  AST 43*  ALT 45*  ALKPHOS 46  BILITOT 1.1  PROT 6.1*  ALBUMIN 3.3*   No results for input(s): LIPASE, AMYLASE in the last 168 hours. No results for input(s): AMMONIA in the last 168 hours. Coagulation Profile: No results for input(s): INR, PROTIME in the last 168 hours. Cardiac Enzymes: Recent Labs  Lab 02/26/20 0457  CKTOTAL 46*   BNP (last 3 results) No results for input(s): PROBNP in the last 8760 hours. HbA1C: No results for input(s): HGBA1C in the last 72 hours. CBG: No results for input(s): GLUCAP in the last 168 hours. Lipid Profile: No results for input(s): CHOL, HDL, LDLCALC, TRIG, CHOLHDL, LDLDIRECT in the last 72 hours. Thyroid Function Tests: Recent Labs    02/26/20 0849  TSH 1.804   Anemia Panel: No results for input(s): VITAMINB12, FOLATE, FERRITIN, TIBC, IRON, RETICCTPCT in the last 72 hours. Sepsis Labs: No results for input(s): PROCALCITON, LATICACIDVEN in the last 168 hours.  Recent Results (from the past 240 hour(s))  Urine culture     Status: Abnormal   Collection Time: 02/26/20  3:45 AM   Specimen: Urine, Random  Result Value Ref Range Status   Specimen Description URINE, RANDOM  Final   Special Requests   Final    NONE Performed at [facility], [address], [place], [place] [address]    Culture >=100,000 COLONIES/mL ESCHERICHIA COLI (A)  Final   Report Status 02/28/2020 FINAL  Final   Organism ID, Bacteria ESCHERICHIA COLI (A)  Final      Susceptibility   Escherichia coli - MIC*    AMPICILLIN >=32 RESISTANT Resistant     CEFAZOLIN >=64 RESISTANT Resistant     CEFEPIME <=0.12 SENSITIVE Sensitive     CEFTRIAXONE 1 SENSITIVE Sensitive     CIPROFLOXACIN >=4 RESISTANT Resistant     GENTAMICIN 2 SENSITIVE Sensitive     IMIPENEM <=0.25 SENSITIVE Sensitive     NITROFURANTOIN <=16 SENSITIVE Sensitive     TRIMETH/SULFA <=20 SENSITIVE Sensitive     AMPICILLIN/SULBACTAM 16 INTERMEDIATE  Intermediate     PIP/TAZO 8 SENSITIVE Sensitive     * >=100,000 COLONIES/mL ESCHERICHIA COLI  SARS CORONAVIRUS 2 (TAT 6-24 HRS) Nasopharyngeal Nasopharyngeal Swab     Status: None   Collection Time: 02/26/20  4:53 AM   Specimen: Nasopharyngeal Swab  Result Value Ref Range Status   SARS Coronavirus 2 NEGATIVE NEGATIVE Final    Comment: (NOTE) SARS-CoV-2 target nucleic acids are NOT DETECTED.  The SARS-CoV-2 RNA is generally detectable in upper and lower respiratory specimens during the acute phase of infection. Negative results do not preclude SARS-CoV-2 infection, do not rule out co-infections with other pathogens, and should not be used as the sole basis for treatment or other patient management decisions. Negative results must be combined with clinical observations, patient history, and epidemiological information. The expected result is Negative.  Fact Sheet for Patients: HairSlick.no  Fact Sheet for Healthcare Providers: quierodirigir.com  This test is not yet approved or cleared by the [place] FDA and  has been authorized for detection and/or diagnosis of SARS-CoV-2 by FDA under an Emergency Use Authorization (EUA). This EUA will remain  in effect (meaning this test can be used) for the duration of the COVID-19 declaration under Se ction 564(b)(1) of the Act, 21 U.S.C. section 360bbb-3(b)(1), unless the authorization is terminated or revoked sooner.  Performed at [facility], [address], [place], [place] [address]          Radiology Studies: No results found.      Scheduled Meds: . alfuzosin  10 mg Oral Daily  . aspirin EC  81 mg Oral Daily  . bethanechol  50 mg Oral TID  . carvedilol  6.25 mg Oral BID WC  . Chlorhexidine Gluconate Cloth  6 each Topical Daily  . clopidogrel  75 mg Oral Daily  . docusate sodium  100 mg Oral BID  . dorzolamide-timolol  1 drop Both Eyes BID  . enoxaparin  (LOVENOX) injection  40 mg Subcutaneous Daily  . ezetimibe  10 mg Oral Daily  . feeding supplement  237 mL Oral TID BM  . finasteride  5 mg Oral Daily  . folic acid  1 mg Oral Daily  .  isosorbide mononitrate  60 mg Oral Daily  . latanoprost  1 drop Both Eyes QPM  . linaclotide  290 mcg Oral QAC breakfast  . melatonin  10 mg Oral QHS  . methenamine  1 g Oral Daily  . multivitamin with minerals  1 tablet Oral Daily  . oxybutynin  10 mg Oral Daily  . pantoprazole  40 mg Oral BID  . ranolazine  1,000 mg Oral BID  . sodium chloride flush  3 mL Intravenous Q12H  . sodium chloride  2 g Oral BID WC  . thiamine  100 mg Oral Daily   Or  . thiamine  100 mg Intravenous Daily   Continuous Infusions: . cefTRIAXone (ROCEPHIN)  IV 1 g (02/27/20 1230)     LOS: 2 days    Time spent:35 mins. More than 50% of that time was spent in counseling and/or coordination of care.      Burnadette PopAmrit Nana Hoselton, MD [facility] P2/15/2022, 8:07 AM

## 2020-02-28 NOTE — Progress Notes (Signed)
Mobility Specialist - Progress Note   02/28/20 1321  Mobility  Activity Ambulated in room  Level of Assistance Moderate assist, patient does 50-74%  Assistive Device Front wheel walker  Distance Ambulated (ft) 18 ft  Mobility Response Tolerated fair  Mobility performed by Mobility specialist  $Mobility charge 1 Mobility   Pre-mobility: 67 HR, 107/52 BP During mobility: 69 HR Post-mobility: 62 HR, 128/60 BP  Pt mod assist to sit up on edge of bed when log rolling, min assist to rock-to-stand w/ bed height elevated. He c/o dizziness that resolved with time once sitting up. Distance limited by back pain and general weakness. Pt min assist to elevate legs when getting back in bed.   Mamie Levers Mobility Specialist Mobility Specialist Phone: [phone]

## 2020-02-28 NOTE — Progress Notes (Signed)
SPO2 82-88% at night. O2 NCL 3 LPM given. Able to keep SPO2 > 92-98%. Lungs clear auscultated. Hemodynamics stable. NSR on monitor. Remained afebrile.  Complained about having nausea; Zofran given. Pt's able to rest well. No acute distress. We continue to monitor.  Filiberto Pinks, RN

## 2020-02-29 NOTE — Progress Notes (Signed)
Mobility Specialist - Progress Note   02/29/20 1257  Mobility  Activity Refused mobility   Pt has refused mobility 2x today, each time stating he is "too tired". Says he didn't sleep much last night. Will f/u as able.   Mamie Levers Mobility Specialist Mobility Specialist Phone: [phone]

## 2020-02-29 NOTE — Progress Notes (Signed)
PROGRESS NOTE    Austin Malone  BMW:[id number] DOB: Jul 18, 1940 DOA: 02/25/2020 PCP: Galvin Proffer, MD   Chief Complain: Weakness, fall  Brief Narrative: Patient is 80 year old male with history of coronary artery disease status post stents, CABG, hypertension, hyperlipidemia, glaucoma, CKD stage III, chronic diastolic CHF, alcohol abuse, BPH with indwelling Foley catheter who presented with weakness and falls.  Report of 2 falls at home and injury on the back.  He has been on chronic indwelling Foley catheter for years and currently taking levofloxacin for recent episode of UTI.  Urine culture sent and its showing significant Ecoli.  PT/OT recommended skilled nursing facility on discharge.  Patient is medically stable for discharge to skilled nursing facility as soon as bed is available.  Assessment & Plan:   Principal Problem:   Fall at home, initial encounter Active Problems:   BPH (benign prostatic hyperplasia)   Hypercholesteremia   Alcohol abuse   Chronic diastolic CHF (congestive heart failure) (HCC)   Essential hypertension   CKD (chronic kidney disease) stage 3, GFR 30-59 ml/min (HCC)   Recurrent UTI   Dehydration with hyponatremia   DNR (do not resuscitate)   Protein-calorie malnutrition, severe   Fall: Lives independently, suffered 2 falls at home.  Found to be dehydrated on presentation.  PT/OT consulted.  Recommend  skilled nursing facility on discharge.  Dehydration/hyponatremia: In the setting of recurrent UTI.  Also has history of chronic alcohol abuse( beer) so Potomania could be contributing.  We will monitor sodium level.  Currently does not look dehydrated.  Stopped IV fluids.  Most likely, poor solute intake could be contributing.  Started  on salt tablets.  Recurrent UTI/urinary retention/Foley catheter: History of frequent UTIs in the past.  Was taking levofloxacin at home, now stopped.    He is to follow-up with urology as an outpatient.  On Uroxatral,  bethanechol, finasteride, oxybutynin.  Urine culture sent here showed significant E. Coli sensitive to ceftriaxone. Currently  on ceftriaxone,aim for 5 days course. Continue IV antibiotics till discharge. We can change antibiotics to oral if he is discharged.  Hypertension: On Coreg  Hyperlipidemia: On Zetia  Coronary artery disease: Status post stents, CABG.  On Plavix, aspirin, Imdur, Ranexa.  History of chronic diastolic congestive heart failure: Takes Lasix at home.  Currently on hold.  Has chronic lower extremity edema, uses compression stockings  CKD stage IIIb: Currently stable  Chronic alcohol abuse: Counseled cessation.  Continue thiamine and folic acid.  On CIWA protocol.  Monitor for withdrawal.  Chronic pain syndrome: Continue supportive care.  Anxiety: Continue Xanax as needed  Glaucoma: Continue home medications  GERD: Continue Protonix    Nutrition Problem: Severe Malnutrition Etiology: social / environmental circumstances (ETOH abuse)      DVT prophylaxis: Lovenox Code Status: DNR Family Communication: Called step daughter on phone on 02/28/20 Status is: Inpatient  Remains inpatient appropriate because:Inpatient level of care appropriate due to severity of illness   Dispo: The patient is from: Home              Anticipated d/c is to: SNF              Anticipated d/c date is:As soon as bed is available              Patient currently is medically stable to d/c.   Difficult to place patient No    Consultants: None  Procedures:None  Antimicrobials:  Anti-infectives (From admission, onward)   Start  Dose/Rate Route Frequency Ordered Stop   02/27/20 1215  cefTRIAXone (ROCEPHIN) 1 g in sodium chloride 0.9 % 100 mL IVPB        1 g 200 mL/hr over 30 Minutes Intravenous Every 24 hours 02/27/20 1127     02/26/20 1000  methenamine (MANDELAMINE) tablet 1 g  Status:  Discontinued        1 g Oral Daily 02/26/20 0758 02/28/20 1038       Subjective:  Patient seen and examined the bedside this morning.  Hemodynamically stable.  Comfortable.  No active issues  Objective: Vitals:   02/28/20 1709 02/28/20 2015 02/28/20 2311 02/29/20 0347  BP: 128/60 (!) 131/57 (!) 143/70 (!) 152/78  Pulse: 60 78 92 95  Resp: 17 18 18 14   Temp: 98.3 F (36.8 C) 98 F (36.7 C) 97.7 F (36.5 C) 98 F (36.7 C)  TempSrc: Oral Oral Oral Oral  SpO2: 96% 98% 98% 95%  Weight:    89.3 kg  Height:        Intake/Output Summary (Last 24 hours) at 02/29/2020 0805 Last data filed at 02/29/2020 0351 Gross per 24 hour  Intake --  Output 2200 ml  Net -2200 ml   Filed Weights   02/27/20 0331 02/28/20 0419 02/29/20 0347  Weight: 87.8 kg 90.1 kg 89.3 kg    Examination:  General exam: Appears calm and comfortable ,Not in distress, pleasant elderly male HEENT:PERRL,Oral mucosa moist, Ear/Nose normal on gross exam Respiratory system: Bilateral equal air entry, normal vesicular breath sounds, no wheezes or crackles  Cardiovascular system: S1 & S2 heard, RRR. No JVD, murmurs, rubs, gallops or clicks. Gastrointestinal system: Abdomen is nondistended, soft and nontender. No organomegaly or masses felt. Normal bowel sounds heard. Central nervous system: Alert and oriented. No focal neurological deficits. Extremities: No edema, no clubbing ,no cyanosis Skin: No rashes, lesions or ulcers,no icterus ,no pallor GU: Foley catheter   Data Reviewed: I have personally reviewed following labs and imaging studies  CBC: Recent Labs  Lab 02/25/20 1819 02/27/20 0055  WBC 6.9 5.1  HGB 12.3* 11.3*  HCT 35.3* 30.3*  MCV 100.6* 96.8  PLT 157 121*   Basic Metabolic Panel: Recent Labs  Lab 02/25/20 1819 02/26/20 0849 02/27/20 0055 02/28/20 0102  NA 127* 131* 127* 131*  K 4.0 4.1 3.6 4.1  CL 91* 96* 95* 98  CO2 19* 20* 24 26  GLUCOSE 76 82 125* 144*  BUN 13 17 14 14   CREATININE 1.00 1.17 0.92 0.92  CALCIUM 9.2 8.8* 8.6* 8.4*  MG  --  2.1  --    --   PHOS  --  3.5  --   --    GFR: Estimated Creatinine Clearance: 70.8 mL/min (by C-G formula based on SCr of 0.92 mg/dL). Liver Function Tests: Recent Labs  Lab 02/26/20 0849  AST 43*  ALT 45*  ALKPHOS 46  BILITOT 1.1  PROT 6.1*  ALBUMIN 3.3*   No results for input(s): LIPASE, AMYLASE in the last 168 hours. No results for input(s): AMMONIA in the last 168 hours. Coagulation Profile: No results for input(s): INR, PROTIME in the last 168 hours. Cardiac Enzymes: Recent Labs  Lab 02/26/20 0457  CKTOTAL 46*   BNP (last 3 results) No results for input(s): PROBNP in the last 8760 hours. HbA1C: No results for input(s): HGBA1C in the last 72 hours. CBG: No results for input(s): GLUCAP in the last 168 hours. Lipid Profile: No results for input(s): CHOL, HDL, LDLCALC, TRIG, CHOLHDL,  LDLDIRECT in the last 72 hours. Thyroid Function Tests: Recent Labs    02/26/20 0849  TSH 1.804   Anemia Panel: No results for input(s): VITAMINB12, FOLATE, FERRITIN, TIBC, IRON, RETICCTPCT in the last 72 hours. Sepsis Labs: No results for input(s): PROCALCITON, LATICACIDVEN in the last 168 hours.  Recent Results (from the past 240 hour(s))  Urine culture     Status: Abnormal   Collection Time: 02/26/20  3:45 AM   Specimen: Urine, Random  Result Value Ref Range Status   Specimen Description URINE, RANDOM  Final   Special Requests   Final    NONE Performed at [facility], [address] [place], [place] [address]    Culture >=100,000 COLONIES/mL ESCHERICHIA COLI (A)  Final   Report Status 02/28/2020 FINAL  Final   Organism ID, Bacteria ESCHERICHIA COLI (A)  Final      Susceptibility   Escherichia coli - MIC*    AMPICILLIN >=32 RESISTANT Resistant     CEFAZOLIN >=64 RESISTANT Resistant     CEFEPIME <=0.12 SENSITIVE Sensitive     CEFTRIAXONE 1 SENSITIVE Sensitive     CIPROFLOXACIN >=4 RESISTANT Resistant     GENTAMICIN 2 SENSITIVE Sensitive     IMIPENEM <=0.25 SENSITIVE  Sensitive     NITROFURANTOIN <=16 SENSITIVE Sensitive     TRIMETH/SULFA <=20 SENSITIVE Sensitive     AMPICILLIN/SULBACTAM 16 INTERMEDIATE Intermediate     PIP/TAZO 8 SENSITIVE Sensitive     * >=100,000 COLONIES/mL ESCHERICHIA COLI  SARS CORONAVIRUS 2 (TAT 6-24 HRS) Nasopharyngeal Nasopharyngeal Swab     Status: None   Collection Time: 02/26/20  4:53 AM   Specimen: Nasopharyngeal Swab  Result Value Ref Range Status   SARS Coronavirus 2 NEGATIVE NEGATIVE Final    Comment: (NOTE) SARS-CoV-2 target nucleic acids are NOT DETECTED.  The SARS-CoV-2 RNA is generally detectable in upper and lower respiratory specimens during the acute phase of infection. Negative results do not preclude SARS-CoV-2 infection, do not rule out co-infections with other pathogens, and should not be used as the sole basis for treatment or other patient management decisions. Negative results must be combined with clinical observations, patient history, and epidemiological information. The expected result is Negative.  Fact Sheet for Patients: HairSlick.nohttps://www.fda.gov/media/138098/download  Fact Sheet for Healthcare Providers: quierodirigir.comhttps://www.fda.gov/media/138095/download  This test is not yet approved or cleared by the [place] FDA and  has been authorized for detection and/or diagnosis of SARS-CoV-2 by FDA under an Emergency Use Authorization (EUA). This EUA will remain  in effect (meaning this test can be used) for the duration of the COVID-19 declaration under Se ction 564(b)(1) of the Act, 21 U.S.C. section 360bbb-3(b)(1), unless the authorization is terminated or revoked sooner.  Performed at [facility], [address] [place], [place] [address]          Radiology Studies: No results found.      Scheduled Meds: . alfuzosin  10 mg Oral Daily  . aspirin EC  81 mg Oral Daily  . bethanechol  50 mg Oral TID  . carvedilol  6.25 mg Oral BID WC  . Chlorhexidine Gluconate Cloth  6 each  Topical Daily  . clopidogrel  75 mg Oral Daily  . docusate sodium  100 mg Oral BID  . dorzolamide-timolol  1 drop Both Eyes BID  . enoxaparin (LOVENOX) injection  40 mg Subcutaneous Daily  . ezetimibe  10 mg Oral Daily  . feeding supplement  237 mL Oral TID BM  .  finasteride  5 mg Oral Daily  . folic acid  1 mg Oral Daily  . isosorbide mononitrate  60 mg Oral Daily  . latanoprost  1 drop Both Eyes QPM  . linaclotide  290 mcg Oral QAC breakfast  . melatonin  10 mg Oral QHS  . multivitamin with minerals  1 tablet Oral Daily  . oxybutynin  10 mg Oral Daily  . pantoprazole  40 mg Oral BID  . ranolazine  1,000 mg Oral BID  . sodium chloride flush  3 mL Intravenous Q12H  . sodium chloride  2 g Oral BID WC  . thiamine  100 mg Oral Daily   Or  . thiamine  100 mg Intravenous Daily   Continuous Infusions: . cefTRIAXone (ROCEPHIN)  IV 1 g (02/28/20 1327)     LOS: 3 days    Time spent:35 mins. More than 50% of that time was spent in counseling and/or coordination of care.      Burnadette Pop, MD [facility] P2/16/2022, 8:05 AM

## 2020-02-29 NOTE — Plan of Care (Signed)

## 2020-02-29 NOTE — Progress Notes (Signed)
PT Cancellation Note  Patient Details Name: ANDRIS BROTHERS MRN: [id number] DOB: [birth year]   Cancelled Treatment:    Reason Eval/Treat Not Completed: Patient declined, no reason specified. Patient reports he is too tired. Asked to be repositioned, but declines further mobility. Will re-attempt tomorrow.    Isola Mehlman 02/29/2020, 12:38 PM

## 2020-03-01 ENCOUNTER — Inpatient Hospital Stay (HOSPITAL_COMMUNITY): Payer: Medicare HMO

## 2020-03-01 LAB — BASIC METABOLIC PANEL
Anion gap: 7 (ref 5–15)
BUN: 12 mg/dL (ref 8–23)
CO2: 24 mmol/L (ref 22–32)
Calcium: 8.4 mg/dL — ABNORMAL LOW (ref 8.9–10.3)
Chloride: 100 mmol/L (ref 98–111)
Creatinine, Ser: 0.97 mg/dL (ref 0.61–1.24)
GFR, Estimated: >60 mL/min (ref >60)
Glucose, Bld: 115 mg/dL — ABNORMAL HIGH (ref 70–99)
Potassium: 4.3 mmol/L (ref 3.5–5.1)
Sodium: 131 mmol/L — ABNORMAL LOW (ref 135–145)

## 2020-03-01 LAB — CBC WITH DIFFERENTIAL/PLATELET
Abs Immature Granulocytes: 0.04 10*3/uL (ref 0.00–0.07)
Basophils Absolute: 0 10*3/uL (ref 0.0–0.1)
Basophils Relative: 1 %
Eosinophils Absolute: 0.1 10*3/uL (ref 0.0–0.5)
Eosinophils Relative: 2 %
HCT: 30.2 % — ABNORMAL LOW (ref 39.0–52.0)
Hemoglobin: 10.6 g/dL — ABNORMAL LOW (ref 13.0–17.0)
Immature Granulocytes: 1 %
Lymphocytes Relative: 21 %
Lymphs Abs: 1.4 10*3/uL (ref 0.7–4.0)
MCH: 36.1 pg — ABNORMAL HIGH (ref 26.0–34.0)
MCHC: 35.1 g/dL (ref 30.0–36.0)
MCV: 102.7 fL — ABNORMAL HIGH (ref 80.0–100.0)
Monocytes Absolute: 0.9 10*3/uL (ref 0.1–1.0)
Monocytes Relative: 14 %
Neutro Abs: 4.2 10*3/uL (ref 1.7–7.7)
Neutrophils Relative %: 61 %
Platelets: 151 10*3/uL (ref 150–400)
RBC: 2.94 MIL/uL — ABNORMAL LOW (ref 4.22–5.81)
RDW: 12.3 % (ref 11.5–15.5)
WBC: 6.7 10*3/uL (ref 4.0–10.5)
nRBC: 0 % (ref 0.0–0.2)

## 2020-03-01 LAB — SARS CORONAVIRUS 2 (TAT 6-24 HRS): SARS Coronavirus 2: NEGATIVE

## 2020-03-01 MED ORDER — ALPRAZOLAM 0.5 MG PO TABS: 0.5000 mg | ORAL_TABLET | Freq: Two times a day (BID) | ORAL | 0 refills | Status: AC | PRN

## 2020-03-01 MED ORDER — TRAMADOL HCL 50 MG PO TABS: 50.0000 mg | ORAL_TABLET | Freq: Two times a day (BID) | ORAL | 0 refills | Status: AC | PRN

## 2020-03-01 MED ORDER — SODIUM CHLORIDE 0.9 % IV SOLN
1.0000 g | INTRAVENOUS | Status: DC
  Administered 2020-03-01 – 2020-03-02 (×2): 1 g via INTRAVENOUS
  Filled 2020-03-01: qty 1
  Filled 2020-03-01: qty 10
  Filled 2020-03-01: qty 1

## 2020-03-01 MED ORDER — AZITHROMYCIN 500 MG PO TABS
500.0000 mg | ORAL_TABLET | Freq: Every day | ORAL | Status: AC
  Administered 2020-03-01 – 2020-03-03 (×3): 500 mg via ORAL
  Filled 2020-03-01 (×3): qty 1

## 2020-03-01 NOTE — Progress Notes (Signed)
Occupational Therapy Treatment Patient Details Name: Austin Malone MRN: [id number] DOB: 10/28/1940 Today's Date: 03/01/2020    History of present illness 80 y.o. male with medical history significant of CAD s/p stents and CABG; HTN; HLD; Glaucoma; stage 3 CKD; chronic diastolic CHF; ETOH abuse; and BPH with indwelling foley presenting with weakness and falls.  He had 2 falls 02/24/20 and injured his back.   OT comments  Pt making slow but steady improvements.  Often will decline therapy upon arrival but once reminded how important it is for him to get up and move around, he participates well.  Pt walked in room with min guard, groomed in standing and completed LE adls. Pt fatigues quickly. Feel pt does need SNF rehab before going home since he lives alone but is progressing well.   Follow Up Recommendations  SNF    Equipment Recommendations  3 in 1 bedside commode    Recommendations for Other Services      Precautions / Restrictions Precautions Precautions: Fall Precaution Comments: significant back pain Restrictions Weight Bearing Restrictions: No       Mobility Bed Mobility Overal bed mobility: Needs Assistance Bed Mobility: Supine to Sit     Supine to sit: Min assist     General bed mobility comments: cues to come to edge of bed.  No physical assist given. Cues to use bedrail instead of therapist.  Transfers Overall transfer level: Needs assistance Equipment used: Rolling walker (2 wheeled) Transfers: Sit to/from [employer] Sit to Stand: Min assist Stand pivot transfers: Min assist       General transfer comment: Pt does better from higher surfaces.  Pt required min assist to power up until the bed was raised to higher level.    Balance Overall balance assessment: Needs assistance Sitting-balance support: Feet supported;No upper extremity supported Sitting balance-Leahy Scale: Good     Standing balance support: Bilateral upper extremity  supported Standing balance-Leahy Scale: Poor Standing balance comment: Pt is reliant on RW when standing. Pt states he uses cane or walker at home almost always.                           ADL either performed or assessed with clinical judgement   ADL Overall ADL's : Needs assistance/impaired Eating/Feeding: Independent;Sitting   Grooming: Min guard;Standing;Wash/dry hands Grooming Details (indicate cue type and reason): Pt walked to end of bed and back and fatigued quickly.  Grooming performed in front of chair standing at table.             Lower Body Dressing: Moderate assistance;Sit to/from stand;Cueing for compensatory techniques Lower Body Dressing Details (indicate cue type and reason): Pt required mod assist to donn socks. Pt normally can donn socks but with new back pain, pt had dificulty pulling legs up to him although did better today than first attempt.  Will consider adaptive equipment if unable to donn on next attempt. Toilet Transfer: Min guard;Ambulation;RW   Toileting- Clothing Manipulation and Hygiene: Minimal assistance;Sit to/from stand       Functional mobility during ADLs: Min guard;Rolling walker General ADL Comments: Pt, at first, resistant to getting out of bed but upon explaining the need for therapy, pt was very agreeable.  Pt struggles with LE adls due to back pain but mobilzed well requiring only min guard when on feet.  Pt may benefit from adaptive equipment.     Vision   Vision Assessment?: No apparent visual deficits  Additional Comments: Pt wears glassses.   Perception     Praxis      Cognition Arousal/Alertness: Awake/alert Behavior During Therapy: WFL for tasks assessed/performed Overall Cognitive Status: No family/caregiver present to determine baseline cognitive functioning                                 General Comments: appears grossly intact other than some slow processing        Exercises     Shoulder  Instructions       General Comments Pt with skin tear on L elbow possibly from his fall.  Pt needs rehab before going home but was very motivated today to participate.    Pertinent Vitals/ Pain       Pain Assessment: Faces Faces Pain Scale: Hurts even more Pain Location: back Pain Descriptors / Indicators: Aching Pain Intervention(s): Limited activity within patient's tolerance;Monitored during session;Repositioned  Home Living                                          Prior Functioning/Environment              Frequency  Min 2X/week        Progress Toward Goals  OT Goals(current goals can now be found in the care plan section)  Progress towards OT goals: Progressing toward goals  Acute Rehab OT Goals Patient Stated Goal: get stronger OT Goal Formulation: With patient Time For Goal Achievement: 03/12/20 Potential to Achieve Goals: Good ADL Goals Pt Will Perform Grooming: with supervision;standing Pt Will Perform Lower Body Bathing: with supervision;with adaptive equipment;sit to/from stand Pt Will Perform Lower Body Dressing: with supervision;sit to/from stand;with adaptive equipment Pt Will Transfer to Toilet: with supervision;ambulating;bedside commode Pt Will Perform Toileting - Clothing Manipulation and hygiene: with supervision;sit to/from stand  Plan Discharge plan remains appropriate    Co-evaluation                 AM-PAC OT "6 Clicks" Daily Activity     Outcome Measure   Help from another person eating meals?: None Help from another person taking care of personal grooming?: None Help from another person toileting, which includes using toliet, bedpan, or urinal?: A Little Help from another person bathing (including washing, rinsing, drying)?: A Little Help from another person to put on and taking off regular upper body clothing?: A Little Help from another person to put on and taking off regular lower body clothing?: A Lot 6  Click Score: 19    End of Session Equipment Utilized During Treatment: Rolling walker;Oxygen  OT Visit Diagnosis: Unsteadiness on feet (R26.81);Other abnormalities of gait and mobility (R26.89);Pain;Muscle weakness (generalized) (M62.81)   Activity Tolerance Patient tolerated treatment well   Patient Left in chair;with call bell/phone within reach   Nurse Communication Mobility status        Time: 4431-5400 OT Time Calculation (min): 23 min  Charges: OT General Charges $OT Visit: 1 Visit OT Treatments $Self Care/Home Management : 23-37 mins   Hope Budds 03/01/2020, 12:07 PM

## 2020-03-01 NOTE — Progress Notes (Signed)
PROGRESS NOTE    Austin Malone  JTT:[id number] DOB: Feb 18, 1940 DOA: 02/25/2020 PCP: Galvin Proffer, MD   Chief Complain: Weakness, fall  Brief Narrative: Patient is 80 year old male with history of coronary artery disease status post stents, CABG, hypertension, hyperlipidemia, glaucoma, CKD stage III, chronic diastolic CHF, alcohol abuse, BPH with indwelling Foley catheter who presented with weakness and falls.  Report of 2 falls at home and injury on the back.  He has been on chronic indwelling Foley catheter for years and currently taking levofloxacin for recent episode of UTI.  Urine culture sent and it showed significant Ecoli.  Started on ceftriaxone. PT/OT recommended skilled nursing facility on discharge.  He was waiting for bed in a skilled nursing facility.  Became hypoxic today requiring 2 L of oxygen per minute, chest x-ray showed left upper lobe pneumonia.  Assessment & Plan:   Principal Problem:   Fall at home, initial encounter Active Problems:   BPH (benign prostatic hyperplasia)   Hypercholesteremia   Alcohol abuse   Chronic diastolic CHF (congestive heart failure) (HCC)   Essential hypertension   CKD (chronic kidney disease) stage 3, GFR 30-59 ml/min (HCC)   Recurrent UTI   Dehydration with hyponatremia   DNR (do not resuscitate)   Protein-calorie malnutrition, severe   Fall: Lives independently, suffered 2 falls at home.  Found to be dehydrated on presentation.  PT/OT consulted.  Recommend  skilled nursing facility on discharge.  Dehydration/hyponatremia: In the setting of recurrent UTI.  Also has history of chronic alcohol abuse( beer) so Potomania could be contributing.  We will monitor sodium level.  Currently does not look dehydrated.  Stopped IV fluids.  Most likely, poor solute intake could be contributing.  Started  on salt tablets for short course.Na level is stable now  Recurrent UTI/urinary retention/Foley catheter: History of frequent UTIs in the past.   Was taking levofloxacin at home, now stopped.    He  follow-ups with urology as an outpatient.  On Uroxatral, bethanechol, finasteride, oxybutynin.  Urine culture sent here showed significant E. Coli sensitive to ceftriaxone. Currently  on ceftriaxone. Continue IV antibiotics till discharge. We can change antibiotics to oral if he is discharged.  Left upper lobe pneumonia/acute hypoxic respiratory failure: New problem today.  Denies any shortness of breath or cough but he was saturating in high 80s on room air.  Auscultation revealed mild crackles on the left side.  Chest x-ray showed left upper lobe pneumonia.  Added azithromycin, continue ceftriaxone for total of 7 days course now.Can be changed to oral on discharge  Hypertension: On Coreg  Hyperlipidemia: On Zetia  Coronary artery disease: Status post stents, CABG.  On Plavix, aspirin, Imdur, Ranexa.  History of chronic diastolic congestive heart failure: Takes Lasix at home.  Marland Kitchen  Has chronic lower extremity edema, uses compression stockings.  Currently appears euvolemic.  Appeared dehydrated and hyponatremic on presentation.  Lasix can be stopped permanently.  CKD stage IIIb: Currently stable.  Kidney function normal at present.  Chronic alcohol abuse: Counseled cessation.  Continue thiamine and folic acid. .  Chronic pain syndrome: Continue supportive care.  Anxiety: Continue Xanax as needed  Glaucoma: Continue home medications  GERD: Continue Protonix    Nutrition Problem: Severe Malnutrition Etiology: social / environmental circumstances (ETOH abuse)      DVT prophylaxis: Lovenox Code Status: DNR Family Communication: Called step daughter on phone on 02/28/20 Status is: Inpatient  Remains inpatient appropriate because:Inpatient level of care appropriate due to severity of  illness   Dispo: The patient is from: Home              Anticipated d/c is to: SNF              Anticipated d/c date is:in 1-2 days,waiting for bed               Patient currently is medically stable to d/c.   Difficult to place patient No    Consultants: None  Procedures:None  Antimicrobials:  Anti-infectives (From admission, onward)   Start     Dose/Rate Route Frequency Ordered Stop   03/01/20 1200  azithromycin (ZITHROMAX) tablet 500 mg        500 mg Oral Daily 03/01/20 1105 03/04/20 0959   03/01/20 1200  cefTRIAXone (ROCEPHIN) 1 g in sodium chloride 0.9 % 100 mL IVPB        1 g 200 mL/hr over 30 Minutes Intravenous Every 24 hours 03/01/20 1105 03/05/20 1159   02/27/20 1215  cefTRIAXone (ROCEPHIN) 1 g in sodium chloride 0.9 % 100 mL IVPB  Status:  Discontinued        1 g 200 mL/hr over 30 Minutes Intravenous Every 24 hours 02/27/20 1127 03/01/20 1105   02/26/20 1000  methenamine (MANDELAMINE) tablet 1 g  Status:  Discontinued        1 g Oral Daily 02/26/20 0758 02/28/20 1038      Subjective:  Patient seen and examined the bedside this morning.  He was found to be mildly hypoxic on room air.  Denies any shortness of breath or cough.  He was more concerned about his dark urine.  Does not complain of any thirst or dry mouth.  Objective: Vitals:   02/29/20 1618 02/29/20 2030 02/29/20 2350 03/01/20 0313  BP: 100/69 (!) 168/69 (!) 165/87 (!) 156/69  Pulse: 63 97 90 93  Resp: 19 20 20 20   Temp: 98.5 F (36.9 C) 98.8 F (37.1 C) 98.4 F (36.9 C) 97.9 F (36.6 C)  TempSrc: Oral Oral Oral Oral  SpO2: 94% 91% 91% 90%  Weight:    88.9 kg  Height:        Intake/Output Summary (Last 24 hours) at 03/01/2020 1108 Last data filed at 03/01/2020 0321 Gross per 24 hour  Intake --  Output 500 ml  Net -500 ml   Filed Weights   02/28/20 0419 02/29/20 0347 03/01/20 0313  Weight: 90.1 kg 89.3 kg 88.9 kg    Examination:  General exam: Debilitated, deconditioned, pleasant elderly male Respiratory system: Mild crackles on the left side Cardiovascular system: S1 & S2 heard, RRR. No JVD, murmurs, rubs, gallops or  clicks. Gastrointestinal system: Abdomen is nondistended, soft and nontender. No organomegaly or masses felt. Normal bowel sounds heard. Central nervous system: Alert and oriented. No focal neurological deficits. Extremities: No edema, no clubbing ,no cyanosis Skin: No rashes, lesions or ulcers,no icterus ,no pallor GU: Foley   Data Reviewed: I have personally reviewed following labs and imaging studies  CBC: Recent Labs  Lab 02/25/20 1819 02/27/20 0055  WBC 6.9 5.1  HGB 12.3* 11.3*  HCT 35.3* 30.3*  MCV 100.6* 96.8  PLT 157 121*   Basic Metabolic Panel: Recent Labs  Lab 02/25/20 1819 02/26/20 0849 02/27/20 0055 02/28/20 0102 03/01/20 0053  NA 127* 131* 127* 131* 131*  K 4.0 4.1 3.6 4.1 4.3  CL 91* 96* 95* 98 100  CO2 19* 20* 24 26 24   GLUCOSE 76 82 125* 144* 115*  BUN 13  17 14 14 12   CREATININE 1.00 1.17 0.92 0.92 0.97  CALCIUM 9.2 8.8* 8.6* 8.4* 8.4*  MG  --  2.1  --   --   --   PHOS  --  3.5  --   --   --    GFR: Estimated Creatinine Clearance: 67.1 mL/min (by C-G formula based on SCr of 0.97 mg/dL). Liver Function Tests: Recent Labs  Lab 02/26/20 0849  AST 43*  ALT 45*  ALKPHOS 46  BILITOT 1.1  PROT 6.1*  ALBUMIN 3.3*   No results for input(s): LIPASE, AMYLASE in the last 168 hours. No results for input(s): AMMONIA in the last 168 hours. Coagulation Profile: No results for input(s): INR, PROTIME in the last 168 hours. Cardiac Enzymes: Recent Labs  Lab 02/26/20 0457  CKTOTAL 46*   BNP (last 3 results) No results for input(s): PROBNP in the last 8760 hours. HbA1C: No results for input(s): HGBA1C in the last 72 hours. CBG: No results for input(s): GLUCAP in the last 168 hours. Lipid Profile: No results for input(s): CHOL, HDL, LDLCALC, TRIG, CHOLHDL, LDLDIRECT in the last 72 hours. Thyroid Function Tests: No results for input(s): TSH, T4TOTAL, FREET4, T3FREE, THYROIDAB in the last 72 hours. Anemia Panel: No results for input(s): VITAMINB12,  FOLATE, FERRITIN, TIBC, IRON, RETICCTPCT in the last 72 hours. Sepsis Labs: No results for input(s): PROCALCITON, LATICACIDVEN in the last 168 hours.  Recent Results (from the past 240 hour(s))  Urine culture     Status: Abnormal   Collection Time: 02/26/20  3:45 AM   Specimen: Urine, Random  Result Value Ref Range Status   Specimen Description URINE, RANDOM  Final   Special Requests   Final    NONE Performed at [facility], [address] [place], [place] [address]    Culture >=100,000 COLONIES/mL ESCHERICHIA COLI (A)  Final   Report Status 02/28/2020 FINAL  Final   Organism ID, Bacteria ESCHERICHIA COLI (A)  Final      Susceptibility   Escherichia coli - MIC*    AMPICILLIN >=32 RESISTANT Resistant     CEFAZOLIN >=64 RESISTANT Resistant     CEFEPIME <=0.12 SENSITIVE Sensitive     CEFTRIAXONE 1 SENSITIVE Sensitive     CIPROFLOXACIN >=4 RESISTANT Resistant     GENTAMICIN 2 SENSITIVE Sensitive     IMIPENEM <=0.25 SENSITIVE Sensitive     NITROFURANTOIN <=16 SENSITIVE Sensitive     TRIMETH/SULFA <=20 SENSITIVE Sensitive     AMPICILLIN/SULBACTAM 16 INTERMEDIATE Intermediate     PIP/TAZO 8 SENSITIVE Sensitive     * >=100,000 COLONIES/mL ESCHERICHIA COLI  SARS CORONAVIRUS 2 (TAT 6-24 HRS) Nasopharyngeal Nasopharyngeal Swab     Status: None   Collection Time: 02/26/20  4:53 AM   Specimen: Nasopharyngeal Swab  Result Value Ref Range Status   SARS Coronavirus 2 NEGATIVE NEGATIVE Final    Comment: (NOTE) SARS-CoV-2 target nucleic acids are NOT DETECTED.  The SARS-CoV-2 RNA is generally detectable in upper and lower respiratory specimens during the acute phase of infection. Negative results do not preclude SARS-CoV-2 infection, do not rule out co-infections with other pathogens, and should not be used as the sole basis for treatment or other patient management decisions. Negative results must be combined with clinical observations, patient history, and epidemiological  information. The expected result is Negative.  Fact Sheet for Patients: HairSlick.nohttps://www.fda.gov/media/138098/download  Fact Sheet for Healthcare Providers: quierodirigir.comhttps://www.fda.gov/media/138095/download  This test is not yet approved or cleared by the [place] FDA and  has been authorized for detection and/or diagnosis of SARS-CoV-2 by FDA under an Emergency Use Authorization (EUA). This EUA will remain  in effect (meaning this test can be used) for the duration of the COVID-19 declaration under Se ction 564(b)(1) of the Act, 21 U.S.C. section 360bbb-3(b)(1), unless the authorization is terminated or revoked sooner.  Performed at [facility], [address] [place], [place] [address]          Radiology Studies: DG CHEST PORT 1 VIEW  Result Date: 03/01/2020 CLINICAL DATA:  Shortness of breath EXAM: PORTABLE CHEST 1 VIEW COMPARISON:  Portable exam 0856 hours compared to 02/26/2020 FINDINGS: Normal heart size post CABG. Mediastinal contours and pulmonary vascularity normal. LEFT upper lobe infiltrate consistent with pneumonia. Slightly increased bibasilar markings which could represent atelectasis or infiltrate. No pleural effusion or pneumothorax. Osseous demineralization with scattered endplate spur formation thoracic spine. IMPRESSION: LEFT upper lobe infiltrate consistent with pneumonia. Additional bibasilar opacities question atelectasis versus infiltrate. Electronically Signed   By: Ulyses Southward M.D.   On: 03/01/2020 10:37        Scheduled Meds: . alfuzosin  10 mg Oral Daily  . aspirin EC  81 mg Oral Daily  . azithromycin  500 mg Oral Daily  . bethanechol  50 mg Oral TID  . carvedilol  6.25 mg Oral BID WC  . Chlorhexidine Gluconate Cloth  6 each Topical Daily  . clopidogrel  75 mg Oral Daily  . docusate sodium  100 mg Oral BID  . dorzolamide-timolol  1 drop Both Eyes BID  . enoxaparin (LOVENOX) injection  40 mg Subcutaneous Daily  . ezetimibe  10 mg Oral Daily  .  feeding supplement  237 mL Oral TID BM  . finasteride  5 mg Oral Daily  . folic acid  1 mg Oral Daily  . isosorbide mononitrate  60 mg Oral Daily  . latanoprost  1 drop Both Eyes QPM  . linaclotide  290 mcg Oral QAC breakfast  . melatonin  10 mg Oral QHS  . multivitamin with minerals  1 tablet Oral Daily  . oxybutynin  10 mg Oral Daily  . pantoprazole  40 mg Oral BID  . ranolazine  1,000 mg Oral BID  . sodium chloride flush  3 mL Intravenous Q12H  . sodium chloride  2 g Oral BID WC  . thiamine  100 mg Oral Daily   Or  . thiamine  100 mg Intravenous Daily   Continuous Infusions: . cefTRIAXone (ROCEPHIN)  IV       LOS: 4 days    Time spent:35 mins. More than 50% of that time was spent in counseling and/or coordination of care.      Burnadette Pop, MD [facility] P2/17/2022, 11:08 AM

## 2020-03-01 NOTE — Care Management Important Message (Signed)
Important Message  Patient Details  Name: Austin Malone MRN: [id number] Date of Birth: 1940/05/15   Medicare Important Message Given:  Yes     Renie Ora 03/01/2020, 10:50 AM

## 2020-03-01 NOTE — Progress Notes (Signed)
Mobility Specialist: Progress Note   03/01/20 1639  Mobility  Activity Refused mobility   Pt refused mobility due to back pain. Explained bed level exercises pt could do this evening. Pt demonstrated understanding. Will f/u tomorrow.   [facility] Cadince Hilscher Mobility Specialist Mobility Specialist Phone: [phone]

## 2020-03-01 NOTE — Progress Notes (Signed)
Physical Therapy Treatment Patient Details Name: Austin Malone MRN: [id number] DOB: 23-Jan-1940 Today's Date: 03/01/2020    History of Present Illness 80 y.o. male with medical history significant of CAD s/p stents and CABG; HTN; HLD; Glaucoma; stage 3 CKD; chronic diastolic CHF; ETOH abuse; and BPH with indwelling foley presenting with weakness and falls.  He had 2 falls 02/24/20 and injured his back.    PT Comments    Pt received in supine, c/o fatigue after recent transfer back to bed and agreeable to bed-level session. Pt performed rolling with min guard, instructed on importance of pressure offloading Q2H in bed and amenable to semi-sidelying position with L hip and heels offloaded. Pt performed supine BLE AROM therapeutic exercises with good tolerance, with cues for technique/reps and "general strengthening" handout given for reinforcement. Pt continues to benefit from PT services to progress toward functional mobility goals. Continue to recommend SNF.   Follow Up Recommendations  SNF (assistance for all mobility)     Equipment Recommendations  None recommended by PT    Recommendations for Other Services       Precautions / Restrictions Precautions Precautions: Fall Precaution Comments: significant back pain Restrictions Weight Bearing Restrictions: No    Mobility  Bed Mobility Overal bed mobility: Needs Assistance Bed Mobility: Rolling Rolling: Min guard         General bed mobility comments: pt refusing EOB mobility, rolling with min guard for insertion of pillow to offload sacrum    Transfers                 General transfer comment: pt refusing due to fatigue, got back in bed ~30 mins prior from chair  Ambulation/Gait                 Stairs             Wheelchair Mobility    Modified Rankin (Stroke Patients Only)       Balance                                            Cognition Arousal/Alertness:  Awake/alert Behavior During Therapy: WFL for tasks assessed/performed Overall Cognitive Status: No family/caregiver present to determine baseline cognitive functioning                                 General Comments: appears grossly intact other than some slow processing, limited insight into importance of mobility      Exercises General Exercises - Lower Extremity Ankle Circles/Pumps: AROM;Both;20 reps;Supine Quad Sets: AROM;Both;15 reps;Supine Gluteal Sets: AROM;Both;15 reps;Supine Heel Slides: AROM;Both;10 reps;Supine Hip ABduction/ADduction: AROM;Both;10 reps;Supine (increased back pain with LLE abd) Straight Leg Raises: AROM;Both;10 reps;Supine    General Comments General comments (skin integrity, edema, etc.): pt agreeable to bed-level session, will need further reinforcement on importance of participation in EOB/OOB mobility      Pertinent Vitals/Pain Pain Assessment: 0-10 Pain Score: 7  Pain Location: back Pain Descriptors / Indicators: Aching Pain Intervention(s): Monitored during session;Repositioned    Home Living                      Prior Function            PT Goals (current goals can now be found in the care plan section) Acute Rehab  PT Goals Patient Stated Goal: get stronger PT Goal Formulation: With patient Time For Goal Achievement: 03/11/20 Potential to Achieve Goals: Good Progress towards PT goals: Progressing toward goals (slow progress)    Frequency    Min 3X/week      PT Plan Current plan remains appropriate    Co-evaluation              AM-PAC PT "6 Clicks" Mobility   Outcome Measure  Help needed turning from your back to your side while in a flat bed without using bedrails?: A Little Help needed moving from lying on your back to sitting on the side of a flat bed without using bedrails?: A Little Help needed moving to and from a bed to a chair (including a wheelchair)?: A Little Help needed standing up  from a chair using your arms (e.g., wheelchair or bedside chair)?: A Little Help needed to walk in hospital room?: A Little Help needed climbing 3-5 steps with a railing? : Total 6 Click Score: 16    End of Session   Activity Tolerance: Patient limited by fatigue Patient left: in bed;with bed alarm set;with call bell/phone within reach (heels floated, pillow under L hip to offload) Nurse Communication: Mobility status PT Visit Diagnosis: Unsteadiness on feet (R26.81);Other abnormalities of gait and mobility (R26.89);Muscle weakness (generalized) (M62.81);History of falling (Z91.81)     Time: 1440-1455 PT Time Calculation (min) (ACUTE ONLY): 15 min  Charges:  $Therapeutic Exercise: 8-22 mins                     Inessa Wardrop P., PTA Acute Rehabilitation Services Pager: [phone] Office: [phone]   Angus Palms 03/01/2020, 4:13 PM

## 2020-03-02 DIAGNOSIS — E871 Hypo-osmolality and hyponatremia: Secondary | ICD-10-CM

## 2020-03-02 DIAGNOSIS — Y92009 Unspecified place in unspecified non-institutional (private) residence as the place of occurrence of the external cause: Secondary | ICD-10-CM

## 2020-03-02 DIAGNOSIS — W19XXXA Unspecified fall, initial encounter: Secondary | ICD-10-CM

## 2020-03-02 DIAGNOSIS — E860 Dehydration: Secondary | ICD-10-CM

## 2020-03-02 LAB — CBC
HCT: 34.1 % — ABNORMAL LOW (ref 39.0–52.0)
Hemoglobin: 11.9 g/dL — ABNORMAL LOW (ref 13.0–17.0)
MCH: 34.9 pg — ABNORMAL HIGH (ref 26.0–34.0)
MCHC: 34.9 g/dL (ref 30.0–36.0)
MCV: 100 fL (ref 80.0–100.0)
Platelets: 169 10*3/uL (ref 150–400)
RBC: 3.41 MIL/uL — ABNORMAL LOW (ref 4.22–5.81)
RDW: 11.8 % (ref 11.5–15.5)
WBC: 7.5 10*3/uL (ref 4.0–10.5)
nRBC: 0 % (ref 0.0–0.2)

## 2020-03-02 LAB — BASIC METABOLIC PANEL
Anion gap: 8 (ref 5–15)
BUN: 10 mg/dL (ref 8–23)
CO2: 23 mmol/L (ref 22–32)
Calcium: 8.6 mg/dL — ABNORMAL LOW (ref 8.9–10.3)
Chloride: 100 mmol/L (ref 98–111)
Creatinine, Ser: 0.85 mg/dL (ref 0.61–1.24)
GFR, Estimated: >60 mL/min (ref >60)
Glucose, Bld: 129 mg/dL — ABNORMAL HIGH (ref 70–99)
Potassium: 4.1 mmol/L (ref 3.5–5.1)
Sodium: 131 mmol/L — ABNORMAL LOW (ref 135–145)

## 2020-03-02 MED ORDER — NYSTATIN 100000 UNIT/ML MT SUSP
5.0000 mL | Freq: Four times a day (QID) | OROMUCOSAL | Status: DC
  Administered 2020-03-02 – 2020-03-04 (×4): 500000 [IU] via ORAL
  Filled 2020-03-02 (×5): qty 5

## 2020-03-02 MED ORDER — ALUM & MAG HYDROXIDE-SIMETH 200-200-20 MG/5ML PO SUSP
30.0000 mL | Freq: Four times a day (QID) | ORAL | Status: DC | PRN
  Administered 2020-03-02: 30 mL via ORAL
  Filled 2020-03-02: qty 30

## 2020-03-02 NOTE — Progress Notes (Signed)
PROGRESS NOTE    Austin Malone  ZOX:[id number] DOB: [birth year] DOA: 02/25/2020 PCP: Galvin ProfferHague, Imran P, MD   Brief Narrative: 80 year old with past medical history significant for coronary artery disease a status post stents, CABG, hypertension, hyperlipidemia, glaucoma, CKD stage III, chronic diastolic heart failure, alcohol abuse, BPH with indwelling Foley catheter who presented with worsening weakness and fall.  Patient report to fall at home and injury on his back.  He has been on chronic indwelling Foley catheter for years and currently taking levofloxacin for recent episode of UTI.  Urine culture sent and it showed significant E. coli.  Patient was a started on ceftriaxone.  PT OT recommended a skilled nursing facility on discharge. Patient became hypoxic on 12/17 requiring 2 L of oxygen, chest x-ray showed left upper lobe pneumonia.  Was a started on azithromycin.    Assessment & Plan:   Principal Problem:   Fall at home, initial encounter Active Problems:   BPH (benign prostatic hyperplasia)   Hypercholesteremia   Alcohol abuse   Chronic diastolic CHF (congestive heart failure) (HCC)   Essential hypertension   CKD (chronic kidney disease) stage 3, GFR 30-59 ml/min (HCC)   Recurrent UTI   Dehydration with hyponatremia   DNR (do not resuscitate)   Protein-calorie malnutrition, severe   1-Fall: Patient suffered  2 falls at home. He was found to be dehydrated on presentation. Recommended a skilled nursing facility on discharge.  2-Dehydration/hyponatremia: Setting of recurrent infection, chronic alcohol abuse. He received IV fluids. He was also started on sodium tablets. Sodium stable.   3-Recurring UTI/urinary retention chronic indwelling Foley catheter: UTI secondary to chronic indwelling Foley catheter And culture grew E. coli resistant to levofloxacin. Continue with ceftriaxone  4-Upper lobe pneumonia/acute hypoxic respiratory failure Chest x ray upper lobe  infiltrates.  Continue with oxygen supplementation.  Continue with ceftriaxone and azithro.   5-HTN;  Continue with coreg.   6-Hyperlipidemia; Continue Zetia.  7-CAD; SP stent, CABG On plavix, aspirin, Imdur< ranexa.  8-history of chronic diastolic heart failure: Appears euvolemic. Holding Lasix.  -5 L 9-CKD stage IIIb: Monitor renal function Alcohol abuse: Continue with thiamine and folic acid.  Counseling cessation Chronic pain syndrome: Continue with supportive care Glaucoma: Continue with home medication. GERD: Continue with Protonix  Nutrition Problem: Severe Malnutrition Etiology: social / environmental circumstances (ETOH abuse)    Signs/Symptoms: mild fat depletion,mild muscle depletion,moderate muscle depletion    Interventions: Ensure Enlive (each supplement provides 350kcal and 20 grams of protein),MVI  Estimated body mass index is 28.12 kg/m as calculated from the following:   Height as of this encounter: 5\' 10"  (1.778 m).   Weight as of this encounter: 88.9 kg.   DVT prophylaxis: Lovenox. Code Status: DNR Family Communication: Family didn't answer phone  Disposition Plan:  Status is: Inpatient  Remains inpatient appropriate because:IV treatments appropriate due to intensity of illness or inability to take PO   Dispo: The patient is from: Home              Anticipated d/c is to: SNF              Anticipated d/c date is: 2 days              Patient currently is not medically stable to d/c.   Difficult to place patient No        Consultants:   none  Procedures:   none  Antimicrobials:  Ceftriaxone and Zithromax.   Subjective: He is not feeling  well, report generalized body pain.  Denies worsening dyspnea. Report cough./   Objective: Vitals:   03/02/20 0325 03/02/20 0419 03/02/20 0935 03/02/20 1200  BP: (!) 171/76 (!) 160/92 (!) 178/88 (!) 144/60  Pulse: 72 75 75 73  Resp: 17 20 18 20   Temp: 98.4 F (36.9 C)  98.8 F (37.1 C) 98  F (36.7 C)  TempSrc: Oral  Oral Oral  SpO2: 94% 96% 95% 94%  Weight:      Height:        Intake/Output Summary (Last 24 hours) at 03/02/2020 1517 Last data filed at 03/02/2020 1200 Gross per 24 hour  Intake -  Output 2900 ml  Net -2900 ml   Filed Weights   02/28/20 0419 02/29/20 0347 03/01/20 0313  Weight: 90.1 kg 89.3 kg 88.9 kg    Examination:  General exam: Appears calm and comfortable  Respiratory system: BL ronchus.  Cardiovascular system: S1 & S2 heard, RRR. No JVD, murmurs, rubs, gallops or clicks. No pedal edema. Gastrointestinal system: Abdomen is nondistended, soft and nontender. No organomegaly or masses felt. Normal bowel sounds heard. Central nervous system: Alert and oriented.  Extremities: Symmetric 5 x 5 power.   Data Reviewed: I have personally reviewed following labs and imaging studies  CBC: Recent Labs  Lab 02/25/20 1819 02/27/20 0055 03/01/20 0054 03/02/20 0832  WBC 6.9 5.1 6.7 7.5  NEUTROABS  --   --  4.2  --   HGB 12.3* 11.3* 10.6* 11.9*  HCT 35.3* 30.3* 30.2* 34.1*  MCV 100.6* 96.8 102.7* 100.0  PLT 157 121* 151 169   Basic Metabolic Panel: Recent Labs  Lab 02/26/20 0849 02/27/20 0055 02/28/20 0102 03/01/20 0053 03/02/20 0832  NA 131* 127* 131* 131* 131*  K 4.1 3.6 4.1 4.3 4.1  CL 96* 95* 98 100 100  CO2 20* 24 26 24 23   GLUCOSE 82 125* 144* 115* 129*  BUN 17 14 14 12 10   CREATININE 1.17 0.92 0.92 0.97 0.85  CALCIUM 8.8* 8.6* 8.4* 8.4* 8.6*  MG 2.1  --   --   --   --   PHOS 3.5  --   --   --   --    GFR: Estimated Creatinine Clearance: 76.5 mL/min (by C-G formula based on SCr of 0.85 mg/dL). Liver Function Tests: Recent Labs  Lab 02/26/20 0849  AST 43*  ALT 45*  ALKPHOS 46  BILITOT 1.1  PROT 6.1*  ALBUMIN 3.3*   No results for input(s): LIPASE, AMYLASE in the last 168 hours. No results for input(s): AMMONIA in the last 168 hours. Coagulation Profile: No results for input(s): INR, PROTIME in the last 168  hours. Cardiac Enzymes: Recent Labs  Lab 02/26/20 0457  CKTOTAL 46*   BNP (last 3 results) No results for input(s): PROBNP in the last 8760 hours. HbA1C: No results for input(s): HGBA1C in the last 72 hours. CBG: No results for input(s): GLUCAP in the last 168 hours. Lipid Profile: No results for input(s): CHOL, HDL, LDLCALC, TRIG, CHOLHDL, LDLDIRECT in the last 72 hours. Thyroid Function Tests: No results for input(s): TSH, T4TOTAL, FREET4, T3FREE, THYROIDAB in the last 72 hours. Anemia Panel: No results for input(s): VITAMINB12, FOLATE, FERRITIN, TIBC, IRON, RETICCTPCT in the last 72 hours. Sepsis Labs: No results for input(s): PROCALCITON, LATICACIDVEN in the last 168 hours.  Recent Results (from the past 240 hour(s))  Urine culture     Status: Abnormal   Collection Time: 02/26/20  3:45 AM   Specimen: Urine,  Random  Result Value Ref Range Status   Specimen Description URINE, RANDOM  Final   Special Requests   Final    NONE Performed at [facility], [address], [place], [place] [address]    Culture >=100,000 COLONIES/mL ESCHERICHIA COLI (A)  Final   Report Status 02/28/2020 FINAL  Final   Organism ID, Bacteria ESCHERICHIA COLI (A)  Final      Susceptibility   Escherichia coli - MIC*    AMPICILLIN >=32 RESISTANT Resistant     CEFAZOLIN >=64 RESISTANT Resistant     CEFEPIME <=0.12 SENSITIVE Sensitive     CEFTRIAXONE 1 SENSITIVE Sensitive     CIPROFLOXACIN >=4 RESISTANT Resistant     GENTAMICIN 2 SENSITIVE Sensitive     IMIPENEM <=0.25 SENSITIVE Sensitive     NITROFURANTOIN <=16 SENSITIVE Sensitive     TRIMETH/SULFA <=20 SENSITIVE Sensitive     AMPICILLIN/SULBACTAM 16 INTERMEDIATE Intermediate     PIP/TAZO 8 SENSITIVE Sensitive     * >=100,000 COLONIES/mL ESCHERICHIA COLI  SARS CORONAVIRUS 2 (TAT 6-24 HRS) Nasopharyngeal Nasopharyngeal Swab     Status: None   Collection Time: 02/26/20  4:53 AM   Specimen: Nasopharyngeal Swab  Result Value Ref Range Status    SARS Coronavirus 2 NEGATIVE NEGATIVE Final    Comment: (NOTE) SARS-CoV-2 target nucleic acids are NOT DETECTED.  The SARS-CoV-2 RNA is generally detectable in upper and lower respiratory specimens during the acute phase of infection. Negative results do not preclude SARS-CoV-2 infection, do not rule out co-infections with other pathogens, and should not be used as the sole basis for treatment or other patient management decisions. Negative results must be combined with clinical observations, patient history, and epidemiological information. The expected result is Negative.  Fact Sheet for Patients: HairSlick.no  Fact Sheet for Healthcare Providers: quierodirigir.com  This test is not yet approved or cleared by the [place] FDA and  has been authorized for detection and/or diagnosis of SARS-CoV-2 by FDA under an Emergency Use Authorization (EUA). This EUA will remain  in effect (meaning this test can be used) for the duration of the COVID-19 declaration under Se ction 564(b)(1) of the Act, 21 U.S.C. section 360bbb-3(b)(1), unless the authorization is terminated or revoked sooner.  Performed at [facility], [address] [place], [place] [address]   SARS CORONAVIRUS 2 (TAT 6-24 HRS) Nasopharyngeal Nasopharyngeal Swab     Status: None   Collection Time: 03/01/20  2:19 PM   Specimen: Nasopharyngeal Swab  Result Value Ref Range Status   SARS Coronavirus 2 NEGATIVE NEGATIVE Final    Comment: (NOTE) SARS-CoV-2 target nucleic acids are NOT DETECTED.  The SARS-CoV-2 RNA is generally detectable in upper and lower respiratory specimens during the acute phase of infection. Negative results do not preclude SARS-CoV-2 infection, do not rule out co-infections with other pathogens, and should not be used as the sole basis for treatment or other patient management decisions. Negative results must be combined with clinical  observations, patient history, and epidemiological information. The expected result is Negative.  Fact Sheet for Patients: HairSlick.no  Fact Sheet for Healthcare Providers: quierodirigir.com  This test is not yet approved or cleared by the [place] FDA and  has been authorized for detection and/or diagnosis of SARS-CoV-2 by FDA under an Emergency Use Authorization (EUA). This EUA will remain  in effect (meaning this test can be used) for the duration of the COVID-19 declaration under Se ction 564(b)(1) of the Act, 21 U.S.C. section  360bbb-3(b)(1), unless the authorization is terminated or revoked sooner.  Performed at [facility], [address], [place], [place] [address]          Radiology Studies: DG CHEST PORT 1 VIEW  Result Date: 03/01/2020 CLINICAL DATA:  Shortness of breath EXAM: PORTABLE CHEST 1 VIEW COMPARISON:  Portable exam 0856 hours compared to 02/26/2020 FINDINGS: Normal heart size post CABG. Mediastinal contours and pulmonary vascularity normal. LEFT upper lobe infiltrate consistent with pneumonia. Slightly increased bibasilar markings which could represent atelectasis or infiltrate. No pleural effusion or pneumothorax. Osseous demineralization with scattered endplate spur formation thoracic spine. IMPRESSION: LEFT upper lobe infiltrate consistent with pneumonia. Additional bibasilar opacities question atelectasis versus infiltrate. Electronically Signed   By: Ulyses Southward M.D.   On: 03/01/2020 10:37        Scheduled Meds: . alfuzosin  10 mg Oral Daily  . aspirin EC  81 mg Oral Daily  . azithromycin  500 mg Oral Daily  . bethanechol  50 mg Oral TID  . carvedilol  6.25 mg Oral BID WC  . Chlorhexidine Gluconate Cloth  6 each Topical Daily  . clopidogrel  75 mg Oral Daily  . docusate sodium  100 mg Oral BID  . dorzolamide-timolol  1 drop Both Eyes BID  . enoxaparin (LOVENOX) injection  40 mg  Subcutaneous Daily  . ezetimibe  10 mg Oral Daily  . feeding supplement  237 mL Oral TID BM  . finasteride  5 mg Oral Daily  . folic acid  1 mg Oral Daily  . isosorbide mononitrate  60 mg Oral Daily  . latanoprost  1 drop Both Eyes QPM  . linaclotide  290 mcg Oral QAC breakfast  . melatonin  10 mg Oral QHS  . multivitamin with minerals  1 tablet Oral Daily  . nystatin  5 mL Oral QID  . oxybutynin  10 mg Oral Daily  . pantoprazole  40 mg Oral BID  . ranolazine  1,000 mg Oral BID  . sodium chloride flush  3 mL Intravenous Q12H  . sodium chloride  2 g Oral BID WC  . thiamine  100 mg Oral Daily   Or  . thiamine  100 mg Intravenous Daily   Continuous Infusions: . cefTRIAXone (ROCEPHIN)  IV 1 g (03/02/20 1227)     LOS: 5 days    Time spent: 35 minutes.     Alba Cory, MD [facility]   If 7PM-7AM, please contact night-coverage [email or web address]  03/02/2020, 3:17 PM

## 2020-03-02 NOTE — Progress Notes (Signed)
Mobility Specialist: Progress Note   03/02/20 1502  Mobility  Activity Refused mobility   Pt refused mobility stating he feels nauseated and his pain level increases with movement, RN notified. Explained the benefits of movement to pt but pt still refusing.   [facility] Austin Malone Mobility Specialist Mobility Specialist Phone: [phone]

## 2020-03-02 NOTE — Progress Notes (Addendum)
PT Cancellation Note  Patient Details Name: Austin Malone MRN: [id number] DOB: 1941/01/05   Cancelled Treatment:    Reason Eval/Treat Not Completed: (P) Patient declined, no reason specified Pt reporting increased fatigue after recent walk to/from bathroom and states "I almost didn't make it there." Will continue efforts per PT POC as schedule permits. Addendum: attempted again @ 1609, pt with active episode nausea/vomiting and feeling unwell, defer this date.  [name] M [name] 03/02/2020, 1:12 PM

## 2020-03-03 MED ORDER — ENSURE ENLIVE PO LIQD: 237.0000 mL | Freq: Three times a day (TID) | ORAL | 12 refills | Status: AC

## 2020-03-03 MED ORDER — CEFDINIR 300 MG PO CAPS: 300.0000 mg | ORAL_CAPSULE | Freq: Two times a day (BID) | ORAL | 0 refills | Status: AC

## 2020-03-03 MED ORDER — CEFDINIR 300 MG PO CAPS
300.0000 mg | ORAL_CAPSULE | Freq: Two times a day (BID) | ORAL | Status: DC
Start: 2020-03-03 — End: 2020-03-04
  Administered 2020-03-03 – 2020-03-04 (×3): 300 mg via ORAL
  Filled 2020-03-03 (×3): qty 1

## 2020-03-03 MED ORDER — FOSFOMYCIN TROMETHAMINE 3 G PO PACK
3.0000 g | PACK | Freq: Once | ORAL | Status: AC
  Administered 2020-03-03: 3 g via ORAL
  Filled 2020-03-03: qty 3

## 2020-03-03 MED ORDER — NYSTATIN 100000 UNIT/ML MT SUSP: 5.0000 mL | Freq: Four times a day (QID) | OROMUCOSAL | 0 refills | Status: AC

## 2020-03-03 MED ORDER — FOLIC ACID 1 MG PO TABS: 1.0000 mg | ORAL_TABLET | Freq: Every day | ORAL | 0 refills | Status: AC

## 2020-03-03 MED ORDER — THIAMINE HCL 100 MG PO TABS: 100.0000 mg | ORAL_TABLET | Freq: Every day | ORAL | 0 refills | Status: AC

## 2020-03-03 NOTE — Discharge Summary (Addendum)
Physician Discharge Summary  Austin Malone JXB:[id number] DOB: 04/13/1940 DOA: 02/25/2020  PCP: Galvin Proffer, MD  Admit date: 02/25/2020 Discharge date: 03/03/2020  Admitted From: Home  Disposition:  SNF  Recommendations for Outpatient Follow-up:  1. Follow up with PCP in 1-2 weeks 2. Please obtain BMP/CBC in one week 3.    Discharge Condition: Stable.  CODE STATUS: DNR Diet recommendation: Heart Healthy   Brief/Interim Summary: 80 year old with past medical history significant for coronary artery disease a status post stents, CABG, hypertension, hyperlipidemia, glaucoma, CKD stage III, chronic diastolic heart failure, alcohol abuse, BPH with indwelling Foley catheter who presented with worsening weakness and fall.  Patient report to fall at home and injury on his back.  He has been on chronic indwelling Foley catheter for years and currently taking levofloxacin for recent episode of UTI.  Urine culture sent and it showed significant E. coli.  Patient was a started on ceftriaxone.  PT OT recommended a skilled nursing facility on discharge. Patient became hypoxic on 12/17 requiring 2 L of oxygen, chest x-ray showed left upper lobe pneumonia.  Was a started on azithromycin.    1-Fall: Patient suffered  2 falls at home. He was found to be dehydrated on presentation. Recommended a skilled nursing facility on discharge.  2-Dehydration/hyponatremia: Setting of recurrent infection, chronic alcohol abuse. He received IV fluids. He received sodium tablets for 5 days. Monitor sodium level.  Sodium stable.   3-Recurring UTI/urinary retention chronic indwelling Foley catheter: UTI secondary to chronic indwelling Foley catheter And culture grew E. coli resistant to levofloxacin. Treated  with ceftriaxone, 3 days. He will received a dose of fosfomycin at discharge.   4-Upper lobe pneumonia/acute hypoxic respiratory failure Chest x ray upper lobe infiltrates.  Continue with oxygen  supplementation.  Continue with ceftriaxone and azithro. Completed azithro. Plan to discharge on Cefdinir for 3 days.    5-HTN;  Continue with coreg.   6-Hyperlipidemia; Continue Zetia.  7-CAD; SP stent, CABG On plavix, aspirin, Imdur< ranexa.  8-history of chronic diastolic heart failure: Appears euvolemic. Holding Lasix.  -5 L. Monitor volume status. Resume lasix as needed.  9-CKD stage IIIb: Monitor renal function Alcohol abuse: received  thiamine and folic acid, continue. .  Counseling cessation Chronic pain syndrome: Continue with supportive care Glaucoma: Continue with home medication. GERD: Continue with Protonix try to contact family, unable to reach family.   Discharge Diagnoses:  Principal Problem:   Fall at home, initial encounter Active Problems:   BPH (benign prostatic hyperplasia)   Hypercholesteremia   Alcohol abuse   Chronic diastolic CHF (congestive heart failure) (HCC)   Essential hypertension   CKD (chronic kidney disease) stage 3, GFR 30-59 ml/min (HCC)   Recurrent UTI   Dehydration with hyponatremia   DNR (do not resuscitate)   Protein-calorie malnutrition, severe    Discharge Instructions  Discharge Instructions    Diet - low sodium heart healthy   Complete by: As directed    Increase activity slowly   Complete by: As directed      Allergies as of 03/03/2020      Reactions   Codeine Other (See Comments)   unknown   Lisinopril Cough   Contrast Media [iodinated Diagnostic Agents] Hives, Rash      Medication List    STOP taking these medications   furosemide 20 MG tablet Commonly known as: LASIX   levofloxacin 750 MG tablet Commonly known as: LEVAQUIN   methenamine 1 g tablet Commonly known as: HIPREX  ondansetron 8 MG tablet Commonly known as: ZOFRAN     TAKE these medications   alfuzosin 10 MG 24 hr tablet Commonly known as: UROXATRAL Take 10 mg by mouth daily.   ALPRAZolam 0.5 MG tablet Commonly known as: XANAX Take 1  tablet (0.5 mg total) by mouth 2 (two) times daily as needed for anxiety or sleep.   aspirin EC 81 MG tablet Take 81 mg by mouth daily.   b complex vitamins tablet Take 1 tablet by mouth daily.   bethanechol 50 MG tablet Commonly known as: URECHOLINE Take 50 mg by mouth 3 (three) times daily.   carvedilol 6.25 MG tablet Commonly known as: COREG Take 1 tablet by mouth 2 (two) times daily.   cefdinir 300 MG capsule Commonly known as: OMNICEF Take 1 capsule (300 mg total) by mouth every 12 (twelve) hours for 3 days.   clopidogrel 75 MG tablet Commonly known as: PLAVIX TAKE 1 TABLET BY MOUTH ONCE (1) DAILY What changed: See the new instructions.   docusate sodium 100 MG capsule Commonly known as: COLACE Take 1 capsule (100 mg total) by mouth daily. What changed:   when to take this  reasons to take this   dorzolamide-timolol 22.3-6.8 MG/ML ophthalmic solution Commonly known as: COSOPT Place 1 drop into both eyes 2 (two) times daily.   ezetimibe 10 MG tablet Commonly known as: ZETIA TAKE 1 TABLET BY MOUTH ONCE (1) DAILY What changed: See the new instructions.   feeding supplement Liqd Take 237 mLs by mouth 3 (three) times daily between meals.   finasteride 5 MG tablet Commonly known as: PROSCAR Take 1 tablet (5 mg total) by mouth daily.   folic acid 1 MG tablet Commonly known as: FOLVITE Take 1 tablet (1 mg total) by mouth daily. Start taking on: March 04, 2020   isosorbide mononitrate 60 MG 24 hr tablet Commonly known as: IMDUR Take 60 mg by mouth daily.   latanoprost 0.005 % ophthalmic solution Commonly known as: XALATAN Place 1 drop into both eyes every evening.   linaclotide 290 MCG Caps capsule Commonly known as: LINZESS Take 290 mcg by mouth daily before breakfast.   Melatonin 12 MG Tabs Take 120 mg by mouth at bedtime.   multivitamin with minerals tablet Take 1 tablet by mouth daily.   nitroGLYCERIN 0.4 MG SL tablet Commonly known as:  NITROSTAT PLACE 1 TABLET UNDER THE TONGUE AS NEEDED FOR CHEST PAIN ( MAY REPEAT EVERY5 MINUTES X 3) What changed:   how much to take  how to take this  when to take this  reasons to take this   nystatin 100000 UNIT/ML suspension Commonly known as: MYCOSTATIN Take 5 mLs (500,000 Units total) by mouth 4 (four) times daily.   oxybutynin 10 MG 24 hr tablet Commonly known as: DITROPAN-XL Take 10 mg by mouth daily.   pantoprazole 40 MG tablet Commonly known as: PROTONIX Take 1 tablet (40 mg total) by mouth 2 (two) times daily.   ranolazine 1000 MG SR tablet Commonly known as: RANEXA Take 1,000 mg by mouth 2 (two) times daily.   thiamine 100 MG tablet Take 1 tablet (100 mg total) by mouth daily. Start taking on: March 04, 2020   traMADol 50 MG tablet Commonly known as: ULTRAM Take 1 tablet (50 mg total) by mouth 2 (two) times daily as needed for moderate pain.   vitamin C 1000 MG tablet Take 1,000 mg by mouth in the morning, at noon, and at bedtime.  Allergies  Allergen Reactions  . Codeine Other (See Comments)    unknown  . Lisinopril Cough  . Contrast Media [Iodinated Diagnostic Agents] Hives and Rash    Consultations:  None   Procedures/Studies: DG Chest 1 View  Result Date: 02/26/2020 CLINICAL DATA:  Pain, fall EXAM: CHEST  1 VIEW COMPARISON:  Chest x-ray 08/22/2019 FINDINGS: The heart size and mediastinal contours are unchanged. Aortic arch calcification. Sternotomy wires well as cardiac surgical changes overlie the mediastinum. Similar-appearing scattered calcified granuloma. No focal consolidation. No pulmonary edema. No pleural effusion. No pneumothorax. No acute osseous abnormality. Multilevel degenerative changes of the spine. IMPRESSION: No active disease. Electronically Signed   By: Tish Frederickson M.D.   On: 02/26/2020 05:13   DG Lumbar Spine Complete  Result Date: 02/26/2020 CLINICAL DATA:  Pain, fall, weakness. EXAM: LUMBAR SPINE -  COMPLETE 4+ VIEW COMPARISON:  CT renal 04/08/2019 FINDINGS: Five non-rib-bearing lumbar vertebral bodies are identified. Multilevel at least moderate degenerative changes of the spine including osteophyte formation and facet arthropathy. There is no evidence of lumbar spine fracture. Markedly limited evaluation of the sacrum on frontal view due to overlying bowel gas. Alignment is normal. Intervertebral disc spaces are maintained. Severe atherosclerotic plaque of the abdominal aorta IMPRESSION: 1. No acute displaced fracture or traumatic listhesis of the lumbar spine in a patient with multilevel degenerative changes. 2.  Aortic Atherosclerosis (ICD10-I70.0). Electronically Signed   By: Tish Frederickson M.D.   On: 02/26/2020 05:17   CT Head Wo Contrast  Result Date: 02/26/2020 CLINICAL DATA:  Neck trauma. EXAM: CT HEAD WITHOUT CONTRAST CT CERVICAL SPINE WITHOUT CONTRAST TECHNIQUE: Multidetector CT imaging of the head and cervical spine was performed following the standard protocol without intravenous contrast. Multiplanar CT image reconstructions of the cervical spine were also generated. COMPARISON:  CT head 06/27/2017 FINDINGS: CT HEAD FINDINGS Brain: Cerebral ventricle sizes are concordant with the degree of cerebral volume loss. Patchy and confluent areas of decreased attenuation are noted throughout the deep and periventricular white matter of the cerebral hemispheres bilaterally, compatible with chronic microvascular ischemic disease. Redemonstration of hypodensity within the left basal ganglia that could represent prior infarction. No evidence of large-territorial acute infarction. No parenchymal hemorrhage. No mass lesion. No extra-axial collection. No mass effect or midline shift. No hydrocephalus. Basilar cisterns are patent. Vascular: No hyperdense vessel. Atherosclerotic calcifications are present within the cavernous internal carotid and vertebral arteries. Skull: No acute fracture or focal lesion.  Sinuses/Orbits: Persistent opacification of the right mastoid cells and middle ear. Paranasal sinuses and left mastoid air cells are clear. Similar-appearing right orbit hyperdensity likely postsurgical. Lens replacement on the left. Similar-appearing slightly heterogeneous matrix on the left. Other: None. CT CERVICAL SPINE FINDINGS Alignment: Straightening of the normal cervical lordosis likely due to positioning and degenerative changes. Skull base and vertebrae: Multilevel degenerative changes of the spine. No acute fracture. No aggressive appearing focal osseous lesion or focal pathologic process. Soft tissues and spinal canal: No prevertebral fluid or swelling. No visible canal hematoma. Upper chest: Mild emphysematous changes. Other: None. IMPRESSION: 1. No acute intracranial abnormality. 2. No acute displaced fracture or traumatic listhesis of the cervical spine. Electronically Signed   By: Tish Frederickson M.D.   On: 02/26/2020 04:42   CT Cervical Spine Wo Contrast  Result Date: 02/26/2020 CLINICAL DATA:  Neck trauma. EXAM: CT HEAD WITHOUT CONTRAST CT CERVICAL SPINE WITHOUT CONTRAST TECHNIQUE: Multidetector CT imaging of the head and cervical spine was performed following the standard protocol without intravenous contrast.  Multiplanar CT image reconstructions of the cervical spine were also generated. COMPARISON:  CT head 06/27/2017 FINDINGS: CT HEAD FINDINGS Brain: Cerebral ventricle sizes are concordant with the degree of cerebral volume loss. Patchy and confluent areas of decreased attenuation are noted throughout the deep and periventricular white matter of the cerebral hemispheres bilaterally, compatible with chronic microvascular ischemic disease. Redemonstration of hypodensity within the left basal ganglia that could represent prior infarction. No evidence of large-territorial acute infarction. No parenchymal hemorrhage. No mass lesion. No extra-axial collection. No mass effect or midline shift. No  hydrocephalus. Basilar cisterns are patent. Vascular: No hyperdense vessel. Atherosclerotic calcifications are present within the cavernous internal carotid and vertebral arteries. Skull: No acute fracture or focal lesion. Sinuses/Orbits: Persistent opacification of the right mastoid cells and middle ear. Paranasal sinuses and left mastoid air cells are clear. Similar-appearing right orbit hyperdensity likely postsurgical. Lens replacement on the left. Similar-appearing slightly heterogeneous matrix on the left. Other: None. CT CERVICAL SPINE FINDINGS Alignment: Straightening of the normal cervical lordosis likely due to positioning and degenerative changes. Skull base and vertebrae: Multilevel degenerative changes of the spine. No acute fracture. No aggressive appearing focal osseous lesion or focal pathologic process. Soft tissues and spinal canal: No prevertebral fluid or swelling. No visible canal hematoma. Upper chest: Mild emphysematous changes. Other: None. IMPRESSION: 1. No acute intracranial abnormality. 2. No acute displaced fracture or traumatic listhesis of the cervical spine. Electronically Signed   By: Tish FredericksonMorgane  Naveau M.D.   On: 02/26/2020 04:42   DG CHEST PORT 1 VIEW  Result Date: 03/01/2020 CLINICAL DATA:  Shortness of breath EXAM: PORTABLE CHEST 1 VIEW COMPARISON:  Portable exam 0856 hours compared to 02/26/2020 FINDINGS: Normal heart size post CABG. Mediastinal contours and pulmonary vascularity normal. LEFT upper lobe infiltrate consistent with pneumonia. Slightly increased bibasilar markings which could represent atelectasis or infiltrate. No pleural effusion or pneumothorax. Osseous demineralization with scattered endplate spur formation thoracic spine. IMPRESSION: LEFT upper lobe infiltrate consistent with pneumonia. Additional bibasilar opacities question atelectasis versus infiltrate. Electronically Signed   By: Ulyses SouthwardMark  Boles M.D.   On: 03/01/2020 10:37     Subjective: He is feeling  better, breathing better.   Discharge Exam: Vitals:   03/03/20 0352 03/03/20 0809  BP: 140/64 (!) 163/75  Pulse: 66 71  Resp: 20 20  Temp: 98.2 F (36.8 C) 98.2 F (36.8 C)  SpO2: 90% 93%     General: Pt is alert, awake, not in acute distress Cardiovascular: RRR, S1/S2 +, no rubs, no gallops Respiratory: CTA bilaterally, no wheezing, no rhonchi Abdominal: Soft, NT, ND, bowel sounds + Extremities: no edema, no cyanosis    The results of significant diagnostics from this hospitalization (including imaging, microbiology, ancillary and laboratory) are listed below for reference.     Microbiology: Recent Results (from the past 240 hour(s))  Urine culture     Status: Abnormal   Collection Time: 02/26/20  3:45 AM   Specimen: Urine, Random  Result Value Ref Range Status   Specimen Description URINE, RANDOM  Final   Special Requests   Final    NONE Performed at [facility], [address] [place], [place] [id number]    Culture >=100,000 COLONIES/mL ESCHERICHIA COLI (A)  Final   Report Status 02/28/2020 FINAL  Final   Organism ID, Bacteria ESCHERICHIA COLI (A)  Final      Susceptibility   Escherichia coli - MIC*    AMPICILLIN >=32 RESISTANT Resistant     CEFAZOLIN >=64 RESISTANT  Resistant     CEFEPIME <=0.12 SENSITIVE Sensitive     CEFTRIAXONE 1 SENSITIVE Sensitive     CIPROFLOXACIN >=4 RESISTANT Resistant     GENTAMICIN 2 SENSITIVE Sensitive     IMIPENEM <=0.25 SENSITIVE Sensitive     NITROFURANTOIN <=16 SENSITIVE Sensitive     TRIMETH/SULFA <=20 SENSITIVE Sensitive     AMPICILLIN/SULBACTAM 16 INTERMEDIATE Intermediate     PIP/TAZO 8 SENSITIVE Sensitive     * >=100,000 COLONIES/mL ESCHERICHIA COLI  SARS CORONAVIRUS 2 (TAT 6-24 HRS) Nasopharyngeal Nasopharyngeal Swab     Status: None   Collection Time: 02/26/20  4:53 AM   Specimen: Nasopharyngeal Swab  Result Value Ref Range Status   SARS Coronavirus 2 NEGATIVE NEGATIVE Final    Comment: (NOTE) SARS-CoV-2  target nucleic acids are NOT DETECTED.  The SARS-CoV-2 RNA is generally detectable in upper and lower respiratory specimens during the acute phase of infection. Negative results do not preclude SARS-CoV-2 infection, do not rule out co-infections with other pathogens, and should not be used as the sole basis for treatment or other patient management decisions. Negative results must be combined with clinical observations, patient history, and epidemiological information. The expected result is Negative.  Fact Sheet for Patients: HairSlick.no  Fact Sheet for Healthcare Providers: quierodirigir.com  This test is not yet approved or cleared by the [place] FDA and  has been authorized for detection and/or diagnosis of SARS-CoV-2 by FDA under an Emergency Use Authorization (EUA). This EUA will remain  in effect (meaning this test can be used) for the duration of the COVID-19 declaration under Se ction 564(b)(1) of the Act, 21 U.S.C. section 360bbb-3(b)(1), unless the authorization is terminated or revoked sooner.  Performed at [facility], [address], [place], [place] [address]   SARS CORONAVIRUS 2 (TAT 6-24 HRS) Nasopharyngeal Nasopharyngeal Swab     Status: None   Collection Time: 03/01/20  2:19 PM   Specimen: Nasopharyngeal Swab  Result Value Ref Range Status   SARS Coronavirus 2 NEGATIVE NEGATIVE Final    Comment: (NOTE) SARS-CoV-2 target nucleic acids are NOT DETECTED.  The SARS-CoV-2 RNA is generally detectable in upper and lower respiratory specimens during the acute phase of infection. Negative results do not preclude SARS-CoV-2 infection, do not rule out co-infections with other pathogens, and should not be used as the sole basis for treatment or other patient management decisions. Negative results must be combined with clinical observations, patient history, and epidemiological information. The  expected result is Negative.  Fact Sheet for Patients: HairSlick.no  Fact Sheet for Healthcare Providers: quierodirigir.com  This test is not yet approved or cleared by the [place] FDA and  has been authorized for detection and/or diagnosis of SARS-CoV-2 by FDA under an Emergency Use Authorization (EUA). This EUA will remain  in effect (meaning this test can be used) for the duration of the COVID-19 declaration under Se ction 564(b)(1) of the Act, 21 U.S.C. section 360bbb-3(b)(1), unless the authorization is terminated or revoked sooner.  Performed at [facility], [address], [place], [place] [address]      Labs: BNP (last 3 results) Recent Labs    08/22/19 0945  BNP 471.0*   Basic Metabolic Panel: Recent Labs  Lab 02/26/20 0849 02/27/20 0055 02/28/20 0102 03/01/20 0053 03/02/20 0832  NA 131* 127* 131* 131* 131*  K 4.1 3.6 4.1 4.3 4.1  CL 96* 95* 98 100 100  CO2 20* 24 26 24 23   GLUCOSE 82 125*  144* 115* 129*  BUN 17 14 14 12 10   CREATININE 1.17 0.92 0.92 0.97 0.85  CALCIUM 8.8* 8.6* 8.4* 8.4* 8.6*  MG 2.1  --   --   --   --   PHOS 3.5  --   --   --   --    Liver Function Tests: Recent Labs  Lab 02/26/20 0849  AST 43*  ALT 45*  ALKPHOS 46  BILITOT 1.1  PROT 6.1*  ALBUMIN 3.3*   No results for input(s): LIPASE, AMYLASE in the last 168 hours. No results for input(s): AMMONIA in the last 168 hours. CBC: Recent Labs  Lab 02/25/20 1819 02/27/20 0055 03/01/20 0054 03/02/20 0832  WBC 6.9 5.1 6.7 7.5  NEUTROABS  --   --  4.2  --   HGB 12.3* 11.3* 10.6* 11.9*  HCT 35.3* 30.3* 30.2* 34.1*  MCV 100.6* 96.8 102.7* 100.0  PLT 157 121* 151 169   Cardiac Enzymes: Recent Labs  Lab 02/26/20 0457  CKTOTAL 46*   BNP: Invalid input(s): POCBNP CBG: No results for input(s): GLUCAP in the last 168 hours. D-Dimer No results for input(s): DDIMER in the last 72 hours. Hgb A1c No results  for input(s): HGBA1C in the last 72 hours. Lipid Profile No results for input(s): CHOL, HDL, LDLCALC, TRIG, CHOLHDL, LDLDIRECT in the last 72 hours. Thyroid function studies No results for input(s): TSH, T4TOTAL, T3FREE, THYROIDAB in the last 72 hours.  Invalid input(s): FREET3 Anemia work up No results for input(s): VITAMINB12, FOLATE, FERRITIN, TIBC, IRON, RETICCTPCT in the last 72 hours. Urinalysis    Component Value Date/Time   COLORURINE AMBER (A) 02/25/2020 0345   APPEARANCEUR HAZY (A) 02/25/2020 0345   LABSPEC 1.015 02/25/2020 0345   PHURINE 5.0 02/25/2020 0345   GLUCOSEU NEGATIVE 02/25/2020 0345   HGBUR NEGATIVE 02/25/2020 0345   BILIRUBINUR NEGATIVE 02/25/2020 0345   KETONESUR 80 (A) 02/25/2020 0345   PROTEINUR NEGATIVE 02/25/2020 0345   NITRITE NEGATIVE 02/25/2020 0345   LEUKOCYTESUR MODERATE (A) 02/25/2020 0345   Sepsis Labs Invalid input(s): PROCALCITONIN,  WBC,  LACTICIDVEN Microbiology Recent Results (from the past 240 hour(s))  Urine culture     Status: Abnormal   Collection Time: 02/26/20  3:45 AM   Specimen: Urine, Random  Result Value Ref Range Status   Specimen Description URINE, RANDOM  Final   Special Requests   Final    NONE Performed at [facility], [address], [place], [place]    Culture >=100,000 COLONIES/mL ESCHERICHIA COLI (A)  Final   Report Status 02/28/2020 FINAL  Final   Organism ID, Bacteria ESCHERICHIA COLI (A)  Final      Susceptibility   Escherichia coli - MIC*    AMPICILLIN >=32 RESISTANT Resistant     CEFAZOLIN >=64 RESISTANT Resistant     CEFEPIME <=0.12 SENSITIVE Sensitive     CEFTRIAXONE 1 SENSITIVE Sensitive     CIPROFLOXACIN >=4 RESISTANT Resistant     GENTAMICIN 2 SENSITIVE Sensitive     IMIPENEM <=0.25 SENSITIVE Sensitive     NITROFURANTOIN <=16 SENSITIVE Sensitive     TRIMETH/SULFA <=20 SENSITIVE Sensitive     AMPICILLIN/SULBACTAM 16 INTERMEDIATE Intermediate     PIP/TAZO 8 SENSITIVE Sensitive     *  >=100,000 COLONIES/mL ESCHERICHIA COLI  SARS CORONAVIRUS 2 (TAT 6-24 HRS) Nasopharyngeal Nasopharyngeal Swab     Status: None   Collection Time: 02/26/20  4:53 AM   Specimen: Nasopharyngeal Swab  Result Value Ref Range Status  SARS Coronavirus 2 NEGATIVE NEGATIVE Final    Comment: (NOTE) SARS-CoV-2 target nucleic acids are NOT DETECTED.  The SARS-CoV-2 RNA is generally detectable in upper and lower respiratory specimens during the acute phase of infection. Negative results do not preclude SARS-CoV-2 infection, do not rule out co-infections with other pathogens, and should not be used as the sole basis for treatment or other patient management decisions. Negative results must be combined with clinical observations, patient history, and epidemiological information. The expected result is Negative.  Fact Sheet for Patients: HairSlick.no  Fact Sheet for Healthcare Providers: quierodirigir.com  This test is not yet approved or cleared by the [place] FDA and  has been authorized for detection and/or diagnosis of SARS-CoV-2 by FDA under an Emergency Use Authorization (EUA). This EUA will remain  in effect (meaning this test can be used) for the duration of the COVID-19 declaration under Se ction 564(b)(1) of the Act, 21 U.S.C. section 360bbb-3(b)(1), unless the authorization is terminated or revoked sooner.  Performed at [facility], [address] [place], [place] [address]   SARS CORONAVIRUS 2 (TAT 6-24 HRS) Nasopharyngeal Nasopharyngeal Swab     Status: None   Collection Time: 03/01/20  2:19 PM   Specimen: Nasopharyngeal Swab  Result Value Ref Range Status   SARS Coronavirus 2 NEGATIVE NEGATIVE Final    Comment: (NOTE) SARS-CoV-2 target nucleic acids are NOT DETECTED.  The SARS-CoV-2 RNA is generally detectable in upper and lower respiratory specimens during the acute phase of infection. Negative results do  not preclude SARS-CoV-2 infection, do not rule out co-infections with other pathogens, and should not be used as the sole basis for treatment or other patient management decisions. Negative results must be combined with clinical observations, patient history, and epidemiological information. The expected result is Negative.  Fact Sheet for Patients: HairSlick.no  Fact Sheet for Healthcare Providers: quierodirigir.com  This test is not yet approved or cleared by the [place] FDA and  has been authorized for detection and/or diagnosis of SARS-CoV-2 by FDA under an Emergency Use Authorization (EUA). This EUA will remain  in effect (meaning this test can be used) for the duration of the COVID-19 declaration under Se ction 564(b)(1) of the Act, 21 U.S.C. section 360bbb-3(b)(1), unless the authorization is terminated or revoked sooner.  Performed at [facility], [address], [place], [place] [address]      Time coordinating discharge: 40 minutes  SIGNED:   Alba Cory, MD  [facility]

## 2020-03-03 NOTE — Progress Notes (Signed)
Mobility Specialist - Progress Note   03/03/20 1132  Mobility  Activity Ambulated in hall  Level of Assistance Minimal assist, patient does 75% or more  Assistive Device Front wheel walker  Distance Ambulated (ft) 50 ft  Mobility Response Tolerated well  Mobility performed by Mobility specialist  $Mobility charge 1 Mobility   Pre-mobility: 65 HR, 131/67 BP Post-mobility: 69 HR, 129/81 BP  Pt min assist to get in/out of bed. Standby assist during ambulation. Distance limited by back pain he rated an 8/10.   Mamie Levers Mobility Specialist Mobility Specialist Phone: [phone]

## 2020-03-03 NOTE — TOC Progression Note (Addendum)
Transition of Care [facility]) - Progression Note    Patient Details  Name: Austin Malone MRN: [id number] Date of Birth: [birth year]  Transition of Care [facility]) CM/SW Contact  Levada Schilling Phone Number: 03/03/2020, 2:16 PM  Clinical Narrative:    CSW spoke with Marcelino Duster on [facility] care line to inform them that pt has a discharge summary in for disposition.  Marcelino Duster ensured me that I will receive a return call back after pt has been reviewed.  TOC will continue to assist with disposition planning.  CSW called facility at 2:13pm & 3:37pm for disposition admission.  Facility will return phone call after reviewing.  Expected Discharge Plan: Skilled Nursing Facility Barriers to Discharge: Insurance Authorization,SNF Pending bed offer  Expected Discharge Plan and Services Expected Discharge Plan: Skilled Nursing Facility In-house Referral: Clinical Social Work     Living arrangements for the past 2 months: Single Family Home Expected Discharge Date: 03/03/20                                     Social Determinants of Health (SDOH) Interventions    Readmission Risk Interventions No flowsheet data found.

## 2020-03-04 MED ORDER — AMLODIPINE BESYLATE 5 MG PO TABS: 5.0000 mg | ORAL_TABLET | Freq: Every day | ORAL | 0 refills | Status: AC

## 2020-03-04 MED ORDER — AMLODIPINE BESYLATE 5 MG PO TABS: 5.0000 mg | ORAL_TABLET | Freq: Every day | ORAL | Status: DC

## 2020-03-04 NOTE — Progress Notes (Signed)
Discharged to [facility] report called  

## 2020-03-04 NOTE — TOC Progression Note (Signed)
Transition of Care [facility]) - Progression Note    Patient Details  Name: Austin Malone MRN: [id number] Date of Birth: June 20, 1940  Transition of Care [facility]) CM/SW Contact  Levada Schilling Phone Number: 03/04/2020, 9:24 AM  Clinical Narrative:    CSW follow up with [facility] this morning.  Facility return CSW phone call at 11.24pm last night.  Facility can take pt today.  CSW updated RN Fayrene Fearing.  TOC will continue to assist with disposition planning.   Expected Discharge Plan: Skilled Nursing Facility Barriers to Discharge: Insurance Authorization,SNF Pending bed offer  Expected Discharge Plan and Services Expected Discharge Plan: Skilled Nursing Facility In-house Referral: Clinical Social Work     Living arrangements for the past 2 months: Single Family Home Expected Discharge Date: 03/03/20                                     Social Determinants of Health (SDOH) Interventions    Readmission Risk Interventions No flowsheet data found.

## 2020-03-04 NOTE — Discharge Summary (Signed)
Physician Discharge Summary  Austin Malone DQQ:[id number] DOB: Oct 22, 1940 DOA: 02/25/2020  PCP: Galvin Proffer, MD  Admit date: 02/25/2020 Discharge date: 03/04/2020  Admitted From: Home  Disposition:  SNF  Recommendations for Outpatient Follow-up:  1. Follow up with PCP in 1-2 weeks 2. Please obtain BMP/CBC in one week    Discharge Condition: Stable.  CODE STATUS: DNR Diet recommendation: Heart Healthy   Brief/Interim Summary: 80 year old with past medical history significant for coronary artery disease a status post stents, CABG, hypertension, hyperlipidemia, glaucoma, CKD stage III, chronic diastolic heart failure, alcohol abuse, BPH with indwelling Foley catheter who presented with worsening weakness and fall.  Patient report to fall at home and injury on his back.  He has been on chronic indwelling Foley catheter for years and currently taking levofloxacin for recent episode of UTI.  Urine culture sent and it showed significant E. coli.  Patient was a started on ceftriaxone.  PT OT recommended a skilled nursing facility on discharge. Patient became hypoxic on 12/17 requiring 2 L of oxygen, chest x-ray showed left upper lobe pneumonia.  Was a started on azithromycin.    1-Fall: Patient suffered  2 falls at home. He was found to be dehydrated on presentation. Recommended a skilled nursing facility on discharge.  2-Dehydration/hyponatremia: Setting of recurrent infection, chronic alcohol abuse. He received IV fluids. He received sodium tablets for 5 days. Monitor sodium level.  Sodium stable.   3-Recurring UTI/urinary retention chronic indwelling Foley catheter: UTI secondary to chronic indwelling Foley catheter And culture grew E. coli resistant to levofloxacin. Treated  with ceftriaxone, 3 days. He will received a dose of fosfomycin at discharge.   4-Upper lobe pneumonia/acute hypoxic respiratory failure Chest x ray upper lobe infiltrates.  Continue with oxygen  supplementation.  Continue with ceftriaxone and azithro. Completed azithro. Plan to discharge on Cefdinir for 3 days.   Continue with 2 L oxygen.   5-HTN;  Continue with coreg.  BP elevated, started Norvasc./   6-Hyperlipidemia; Continue Zetia.  7-CAD; SP stent, CABG On plavix, aspirin, Imdur< ranexa.  8-history of chronic diastolic heart failure: Appears euvolemic. Holding Lasix.  -5 L. Monitor volume status. Resume lasix as needed.  9-CKD stage IIIb: Monitor renal function Alcohol abuse: received  thiamine and folic acid, continue. .  Counseling cessation Chronic pain syndrome: Continue with supportive care Glaucoma: Continue with home medication. GERD: Continue with Protonix try to contact family, unable to reach family.   Discharge Diagnoses:  Principal Problem:   Fall at home, initial encounter Active Problems:   BPH (benign prostatic hyperplasia)   Hypercholesteremia   Alcohol abuse   Chronic diastolic CHF (congestive heart failure) (HCC)   Essential hypertension   CKD (chronic kidney disease) stage 3, GFR 30-59 ml/min (HCC)   Recurrent UTI   Dehydration with hyponatremia   DNR (do not resuscitate)   Protein-calorie malnutrition, severe    Discharge Instructions  Discharge Instructions    Diet - low sodium heart healthy   Complete by: As directed    Increase activity slowly   Complete by: As directed      Allergies as of 03/04/2020      Reactions   Codeine Other (See Comments)   unknown   Lisinopril Cough   Contrast Media [iodinated Diagnostic Agents] Hives, Rash      Medication List    STOP taking these medications   furosemide 20 MG tablet Commonly known as: LASIX   levofloxacin 750 MG tablet Commonly known as: LEVAQUIN  methenamine 1 g tablet Commonly known as: HIPREX   ondansetron 8 MG tablet Commonly known as: ZOFRAN     TAKE these medications   alfuzosin 10 MG 24 hr tablet Commonly known as: UROXATRAL Take 10 mg by mouth daily.    ALPRAZolam 0.5 MG tablet Commonly known as: XANAX Take 1 tablet (0.5 mg total) by mouth 2 (two) times daily as needed for anxiety or sleep.   amLODipine 5 MG tablet Commonly known as: NORVASC Take 1 tablet (5 mg total) by mouth daily.   aspirin EC 81 MG tablet Take 81 mg by mouth daily.   b complex vitamins tablet Take 1 tablet by mouth daily.   bethanechol 50 MG tablet Commonly known as: URECHOLINE Take 50 mg by mouth 3 (three) times daily.   carvedilol 6.25 MG tablet Commonly known as: COREG Take 1 tablet by mouth 2 (two) times daily.   cefdinir 300 MG capsule Commonly known as: OMNICEF Take 1 capsule (300 mg total) by mouth every 12 (twelve) hours for 3 days.   clopidogrel 75 MG tablet Commonly known as: PLAVIX TAKE 1 TABLET BY MOUTH ONCE (1) DAILY What changed: See the new instructions.   docusate sodium 100 MG capsule Commonly known as: COLACE Take 1 capsule (100 mg total) by mouth daily. What changed:   when to take this  reasons to take this   dorzolamide-timolol 22.3-6.8 MG/ML ophthalmic solution Commonly known as: COSOPT Place 1 drop into both eyes 2 (two) times daily.   ezetimibe 10 MG tablet Commonly known as: ZETIA TAKE 1 TABLET BY MOUTH ONCE (1) DAILY What changed: See the new instructions.   feeding supplement Liqd Take 237 mLs by mouth 3 (three) times daily between meals.   finasteride 5 MG tablet Commonly known as: PROSCAR Take 1 tablet (5 mg total) by mouth daily.   folic acid 1 MG tablet Commonly known as: FOLVITE Take 1 tablet (1 mg total) by mouth daily.   isosorbide mononitrate 60 MG 24 hr tablet Commonly known as: IMDUR Take 60 mg by mouth daily.   latanoprost 0.005 % ophthalmic solution Commonly known as: XALATAN Place 1 drop into both eyes every evening.   linaclotide 290 MCG Caps capsule Commonly known as: LINZESS Take 290 mcg by mouth daily before breakfast.   Melatonin 12 MG Tabs Take 120 mg by mouth at bedtime.    multivitamin with minerals tablet Take 1 tablet by mouth daily.   nitroGLYCERIN 0.4 MG SL tablet Commonly known as: NITROSTAT PLACE 1 TABLET UNDER THE TONGUE AS NEEDED FOR CHEST PAIN ( MAY REPEAT EVERY5 MINUTES X 3) What changed:   how much to take  how to take this  when to take this  reasons to take this   nystatin 100000 UNIT/ML suspension Commonly known as: MYCOSTATIN Take 5 mLs (500,000 Units total) by mouth 4 (four) times daily.   oxybutynin 10 MG 24 hr tablet Commonly known as: DITROPAN-XL Take 10 mg by mouth daily.   pantoprazole 40 MG tablet Commonly known as: PROTONIX Take 1 tablet (40 mg total) by mouth 2 (two) times daily.   ranolazine 1000 MG SR tablet Commonly known as: RANEXA Take 1,000 mg by mouth 2 (two) times daily.   thiamine 100 MG tablet Take 1 tablet (100 mg total) by mouth daily.   traMADol 50 MG tablet Commonly known as: ULTRAM Take 1 tablet (50 mg total) by mouth 2 (two) times daily as needed for moderate pain.   vitamin C 1000  MG tablet Take 1,000 mg by mouth in the morning, at noon, and at bedtime.       Allergies  Allergen Reactions  . Codeine Other (See Comments)    unknown  . Lisinopril Cough  . Contrast Media [Iodinated Diagnostic Agents] Hives and Rash    Consultations:  None   Procedures/Studies: DG Chest 1 View  Result Date: 02/26/2020 CLINICAL DATA:  Pain, fall EXAM: CHEST  1 VIEW COMPARISON:  Chest x-ray 08/22/2019 FINDINGS: The heart size and mediastinal contours are unchanged. Aortic arch calcification. Sternotomy wires well as cardiac surgical changes overlie the mediastinum. Similar-appearing scattered calcified granuloma. No focal consolidation. No pulmonary edema. No pleural effusion. No pneumothorax. No acute osseous abnormality. Multilevel degenerative changes of the spine. IMPRESSION: No active disease. Electronically Signed   By: Tish Frederickson M.D.   On: 02/26/2020 05:13   DG Lumbar Spine  Complete  Result Date: 02/26/2020 CLINICAL DATA:  Pain, fall, weakness. EXAM: LUMBAR SPINE - COMPLETE 4+ VIEW COMPARISON:  CT renal 04/08/2019 FINDINGS: Five non-rib-bearing lumbar vertebral bodies are identified. Multilevel at least moderate degenerative changes of the spine including osteophyte formation and facet arthropathy. There is no evidence of lumbar spine fracture. Markedly limited evaluation of the sacrum on frontal view due to overlying bowel gas. Alignment is normal. Intervertebral disc spaces are maintained. Severe atherosclerotic plaque of the abdominal aorta IMPRESSION: 1. No acute displaced fracture or traumatic listhesis of the lumbar spine in a patient with multilevel degenerative changes. 2.  Aortic Atherosclerosis (ICD10-I70.0). Electronically Signed   By: Tish Frederickson M.D.   On: 02/26/2020 05:17   CT Head Wo Contrast  Result Date: 02/26/2020 CLINICAL DATA:  Neck trauma. EXAM: CT HEAD WITHOUT CONTRAST CT CERVICAL SPINE WITHOUT CONTRAST TECHNIQUE: Multidetector CT imaging of the head and cervical spine was performed following the standard protocol without intravenous contrast. Multiplanar CT image reconstructions of the cervical spine were also generated. COMPARISON:  CT head 06/27/2017 FINDINGS: CT HEAD FINDINGS Brain: Cerebral ventricle sizes are concordant with the degree of cerebral volume loss. Patchy and confluent areas of decreased attenuation are noted throughout the deep and periventricular white matter of the cerebral hemispheres bilaterally, compatible with chronic microvascular ischemic disease. Redemonstration of hypodensity within the left basal ganglia that could represent prior infarction. No evidence of large-territorial acute infarction. No parenchymal hemorrhage. No mass lesion. No extra-axial collection. No mass effect or midline shift. No hydrocephalus. Basilar cisterns are patent. Vascular: No hyperdense vessel. Atherosclerotic calcifications are present within the  cavernous internal carotid and vertebral arteries. Skull: No acute fracture or focal lesion. Sinuses/Orbits: Persistent opacification of the right mastoid cells and middle ear. Paranasal sinuses and left mastoid air cells are clear. Similar-appearing right orbit hyperdensity likely postsurgical. Lens replacement on the left. Similar-appearing slightly heterogeneous matrix on the left. Other: None. CT CERVICAL SPINE FINDINGS Alignment: Straightening of the normal cervical lordosis likely due to positioning and degenerative changes. Skull base and vertebrae: Multilevel degenerative changes of the spine. No acute fracture. No aggressive appearing focal osseous lesion or focal pathologic process. Soft tissues and spinal canal: No prevertebral fluid or swelling. No visible canal hematoma. Upper chest: Mild emphysematous changes. Other: None. IMPRESSION: 1. No acute intracranial abnormality. 2. No acute displaced fracture or traumatic listhesis of the cervical spine. Electronically Signed   By: Tish Frederickson M.D.   On: 02/26/2020 04:42   CT Cervical Spine Wo Contrast  Result Date: 02/26/2020 CLINICAL DATA:  Neck trauma. EXAM: CT HEAD WITHOUT CONTRAST CT CERVICAL SPINE  WITHOUT CONTRAST TECHNIQUE: Multidetector CT imaging of the head and cervical spine was performed following the standard protocol without intravenous contrast. Multiplanar CT image reconstructions of the cervical spine were also generated. COMPARISON:  CT head 06/27/2017 FINDINGS: CT HEAD FINDINGS Brain: Cerebral ventricle sizes are concordant with the degree of cerebral volume loss. Patchy and confluent areas of decreased attenuation are noted throughout the deep and periventricular white matter of the cerebral hemispheres bilaterally, compatible with chronic microvascular ischemic disease. Redemonstration of hypodensity within the left basal ganglia that could represent prior infarction. No evidence of large-territorial acute infarction. No  parenchymal hemorrhage. No mass lesion. No extra-axial collection. No mass effect or midline shift. No hydrocephalus. Basilar cisterns are patent. Vascular: No hyperdense vessel. Atherosclerotic calcifications are present within the cavernous internal carotid and vertebral arteries. Skull: No acute fracture or focal lesion. Sinuses/Orbits: Persistent opacification of the right mastoid cells and middle ear. Paranasal sinuses and left mastoid air cells are clear. Similar-appearing right orbit hyperdensity likely postsurgical. Lens replacement on the left. Similar-appearing slightly heterogeneous matrix on the left. Other: None. CT CERVICAL SPINE FINDINGS Alignment: Straightening of the normal cervical lordosis likely due to positioning and degenerative changes. Skull base and vertebrae: Multilevel degenerative changes of the spine. No acute fracture. No aggressive appearing focal osseous lesion or focal pathologic process. Soft tissues and spinal canal: No prevertebral fluid or swelling. No visible canal hematoma. Upper chest: Mild emphysematous changes. Other: None. IMPRESSION: 1. No acute intracranial abnormality. 2. No acute displaced fracture or traumatic listhesis of the cervical spine. Electronically Signed   By: Tish Frederickson M.D.   On: 02/26/2020 04:42   DG CHEST PORT 1 VIEW  Result Date: 03/01/2020 CLINICAL DATA:  Shortness of breath EXAM: PORTABLE CHEST 1 VIEW COMPARISON:  Portable exam 0856 hours compared to 02/26/2020 FINDINGS: Normal heart size post CABG. Mediastinal contours and pulmonary vascularity normal. LEFT upper lobe infiltrate consistent with pneumonia. Slightly increased bibasilar markings which could represent atelectasis or infiltrate. No pleural effusion or pneumothorax. Osseous demineralization with scattered endplate spur formation thoracic spine. IMPRESSION: LEFT upper lobe infiltrate consistent with pneumonia. Additional bibasilar opacities question atelectasis versus infiltrate.  Electronically Signed   By: Ulyses Southward M.D.   On: 03/01/2020 10:37     Subjective: He is feeling better, breathing better.   Discharge Exam: Vitals:   03/04/20 0412 03/04/20 0829  BP: (!) 186/90 (!) 177/86  Pulse: 79 72  Resp: 20 20  Temp: 98.3 F (36.8 C) 98.4 F (36.9 C)  SpO2: 90% 96%     General: Pt is alert, awake, not in acute distress Cardiovascular: RRR, S1/S2 +, no rubs, no gallops Respiratory: CTA bilaterally, no wheezing, no rhonchi Abdominal: Soft, NT, ND, bowel sounds + Extremities: no edema, no cyanosis    The results of significant diagnostics from this hospitalization (including imaging, microbiology, ancillary and laboratory) are listed below for reference.     Microbiology: Recent Results (from the past 240 hour(s))  Urine culture     Status: Abnormal   Collection Time: 02/26/20  3:45 AM   Specimen: Urine, Random  Result Value Ref Range Status   Specimen Description URINE, RANDOM  Final   Special Requests   Final    NONE Performed at [facility], [address] [place], [place] [address]    Culture >=100,000 COLONIES/mL ESCHERICHIA COLI (A)  Final   Report Status 02/28/2020 FINAL  Final   Organism ID, Bacteria ESCHERICHIA COLI (A)  Final  Susceptibility   Escherichia coli - MIC*    AMPICILLIN >=32 RESISTANT Resistant     CEFAZOLIN >=64 RESISTANT Resistant     CEFEPIME <=0.12 SENSITIVE Sensitive     CEFTRIAXONE 1 SENSITIVE Sensitive     CIPROFLOXACIN >=4 RESISTANT Resistant     GENTAMICIN 2 SENSITIVE Sensitive     IMIPENEM <=0.25 SENSITIVE Sensitive     NITROFURANTOIN <=16 SENSITIVE Sensitive     TRIMETH/SULFA <=20 SENSITIVE Sensitive     AMPICILLIN/SULBACTAM 16 INTERMEDIATE Intermediate     PIP/TAZO 8 SENSITIVE Sensitive     * >=100,000 COLONIES/mL ESCHERICHIA COLI  SARS CORONAVIRUS 2 (TAT 6-24 HRS) Nasopharyngeal Nasopharyngeal Swab     Status: None   Collection Time: 02/26/20  4:53 AM   Specimen: Nasopharyngeal Swab   Result Value Ref Range Status   SARS Coronavirus 2 NEGATIVE NEGATIVE Final    Comment: (NOTE) SARS-CoV-2 target nucleic acids are NOT DETECTED.  The SARS-CoV-2 RNA is generally detectable in upper and lower respiratory specimens during the acute phase of infection. Negative results do not preclude SARS-CoV-2 infection, do not rule out co-infections with other pathogens, and should not be used as the sole basis for treatment or other patient management decisions. Negative results must be combined with clinical observations, patient history, and epidemiological information. The expected result is Negative.  Fact Sheet for Patients: HairSlick.nohttps://www.fda.gov/media/138098/download  Fact Sheet for Healthcare Providers: quierodirigir.comhttps://www.fda.gov/media/138095/download  This test is not yet approved or cleared by the [place] FDA and  has been authorized for detection and/or diagnosis of SARS-CoV-2 by FDA under an Emergency Use Authorization (EUA). This EUA will remain  in effect (meaning this test can be used) for the duration of the COVID-19 declaration under Se ction 564(b)(1) of the Act, 21 U.S.C. section 360bbb-3(b)(1), unless the authorization is terminated or revoked sooner.  Performed at [facility], [address] [place], [place] [address]   SARS CORONAVIRUS 2 (TAT 6-24 HRS) Nasopharyngeal Nasopharyngeal Swab     Status: None   Collection Time: 03/01/20  2:19 PM   Specimen: Nasopharyngeal Swab  Result Value Ref Range Status   SARS Coronavirus 2 NEGATIVE NEGATIVE Final    Comment: (NOTE) SARS-CoV-2 target nucleic acids are NOT DETECTED.  The SARS-CoV-2 RNA is generally detectable in upper and lower respiratory specimens during the acute phase of infection. Negative results do not preclude SARS-CoV-2 infection, do not rule out co-infections with other pathogens, and should not be used as the sole basis for treatment or other patient management decisions. Negative  results must be combined with clinical observations, patient history, and epidemiological information. The expected result is Negative.  Fact Sheet for Patients: HairSlick.nohttps://www.fda.gov/media/138098/download  Fact Sheet for Healthcare Providers: quierodirigir.comhttps://www.fda.gov/media/138095/download  This test is not yet approved or cleared by the [place] FDA and  has been authorized for detection and/or diagnosis of SARS-CoV-2 by FDA under an Emergency Use Authorization (EUA). This EUA will remain  in effect (meaning this test can be used) for the duration of the COVID-19 declaration under Se ction 564(b)(1) of the Act, 21 U.S.C. section 360bbb-3(b)(1), unless the authorization is terminated or revoked sooner.  Performed at [facility], [address] [place], [place] [address]      Labs: BNP (last 3 results) Recent Labs    08/22/19 0945  BNP 471.0*   Basic Metabolic Panel: Recent Labs  Lab 02/27/20 0055 02/28/20 0102 03/01/20 0053 03/02/20 0832  NA 127* 131* 131* 131*  K 3.6 4.1 4.3 4.1  CL 95* 98 100 100  CO2 24 26 24 23   GLUCOSE 125* 144* 115* 129*  BUN 14 14 12 10   CREATININE 0.92 0.92 0.97 0.85  CALCIUM 8.6* 8.4* 8.4* 8.6*   Liver Function Tests: No results for input(s): AST, ALT, ALKPHOS, BILITOT, PROT, ALBUMIN in the last 168 hours. No results for input(s): LIPASE, AMYLASE in the last 168 hours. No results for input(s): AMMONIA in the last 168 hours. CBC: Recent Labs  Lab 02/27/20 0055 03/01/20 0054 03/02/20 0832  WBC 5.1 6.7 7.5  NEUTROABS  --  4.2  --   HGB 11.3* 10.6* 11.9*  HCT 30.3* 30.2* 34.1*  MCV 96.8 102.7* 100.0  PLT 121* 151 169   Cardiac Enzymes: No results for input(s): CKTOTAL, CKMB, CKMBINDEX, TROPONINI in the last 168 hours. BNP: Invalid input(s): POCBNP CBG: No results for input(s): GLUCAP in the last 168 hours. D-Dimer No results for input(s): DDIMER in the last 72 hours. Hgb A1c No results for input(s): HGBA1C in the last  72 hours. Lipid Profile No results for input(s): CHOL, HDL, LDLCALC, TRIG, CHOLHDL, LDLDIRECT in the last 72 hours. Thyroid function studies No results for input(s): TSH, T4TOTAL, T3FREE, THYROIDAB in the last 72 hours.  Invalid input(s): FREET3 Anemia work up No results for input(s): VITAMINB12, FOLATE, FERRITIN, TIBC, IRON, RETICCTPCT in the last 72 hours. Urinalysis    Component Value Date/Time   COLORURINE AMBER (A) 02/25/2020 0345   APPEARANCEUR HAZY (A) 02/25/2020 0345   LABSPEC 1.015 02/25/2020 0345   PHURINE 5.0 02/25/2020 0345   GLUCOSEU NEGATIVE 02/25/2020 0345   HGBUR NEGATIVE 02/25/2020 0345   BILIRUBINUR NEGATIVE 02/25/2020 0345   KETONESUR 80 (A) 02/25/2020 0345   PROTEINUR NEGATIVE 02/25/2020 0345   NITRITE NEGATIVE 02/25/2020 0345   LEUKOCYTESUR MODERATE (A) 02/25/2020 0345   Sepsis Labs Invalid input(s): PROCALCITONIN,  WBC,  LACTICIDVEN Microbiology Recent Results (from the past 240 hour(s))  Urine culture     Status: Abnormal   Collection Time: 02/26/20  3:45 AM   Specimen: Urine, Random  Result Value Ref Range Status   Specimen Description URINE, RANDOM  Final   Special Requests   Final    NONE Performed at [facility], [address] [place], [address] [place]    Culture >=100,000 COLONIES/mL ESCHERICHIA COLI (A)  Final   Report Status 02/28/2020 FINAL  Final   Organism ID, Bacteria ESCHERICHIA COLI (A)  Final      Susceptibility   Escherichia coli - MIC*    AMPICILLIN >=32 RESISTANT Resistant     CEFAZOLIN >=64 RESISTANT Resistant     CEFEPIME <=0.12 SENSITIVE Sensitive     CEFTRIAXONE 1 SENSITIVE Sensitive     CIPROFLOXACIN >=4 RESISTANT Resistant     GENTAMICIN 2 SENSITIVE Sensitive     IMIPENEM <=0.25 SENSITIVE Sensitive     NITROFURANTOIN <=16 SENSITIVE Sensitive     TRIMETH/SULFA <=20 SENSITIVE Sensitive     AMPICILLIN/SULBACTAM 16 INTERMEDIATE Intermediate     PIP/TAZO 8 SENSITIVE Sensitive     * >=100,000 COLONIES/mL  ESCHERICHIA COLI  SARS CORONAVIRUS 2 (TAT 6-24 HRS) Nasopharyngeal Nasopharyngeal Swab     Status: None   Collection Time: 02/26/20  4:53 AM   Specimen: Nasopharyngeal Swab  Result Value Ref Range Status   SARS Coronavirus 2 NEGATIVE NEGATIVE Final    Comment: (NOTE) SARS-CoV-2 target nucleic acids are NOT DETECTED.  The SARS-CoV-2 RNA is generally detectable in upper and lower respiratory specimens during the acute phase of infection.  Negative results do not preclude SARS-CoV-2 infection, do not rule out co-infections with other pathogens, and should not be used as the sole basis for treatment or other patient management decisions. Negative results must be combined with clinical observations, patient history, and epidemiological information. The expected result is Negative.  Fact Sheet for Patients: HairSlick.no  Fact Sheet for Healthcare Providers: quierodirigir.com  This test is not yet approved or cleared by the [place] FDA and  has been authorized for detection and/or diagnosis of SARS-CoV-2 by FDA under an Emergency Use Authorization (EUA). This EUA will remain  in effect (meaning this test can be used) for the duration of the COVID-19 declaration under Se ction 564(b)(1) of the Act, 21 U.S.C. section 360bbb-3(b)(1), unless the authorization is terminated or revoked sooner.  Performed at [facility], [address], [place], [place] [address]   SARS CORONAVIRUS 2 (TAT 6-24 HRS) Nasopharyngeal Nasopharyngeal Swab     Status: None   Collection Time: 03/01/20  2:19 PM   Specimen: Nasopharyngeal Swab  Result Value Ref Range Status   SARS Coronavirus 2 NEGATIVE NEGATIVE Final    Comment: (NOTE) SARS-CoV-2 target nucleic acids are NOT DETECTED.  The SARS-CoV-2 RNA is generally detectable in upper and lower respiratory specimens during the acute phase of infection. Negative results do not preclude SARS-CoV-2  infection, do not rule out co-infections with other pathogens, and should not be used as the sole basis for treatment or other patient management decisions. Negative results must be combined with clinical observations, patient history, and epidemiological information. The expected result is Negative.  Fact Sheet for Patients: HairSlick.no  Fact Sheet for Healthcare Providers: quierodirigir.com  This test is not yet approved or cleared by the [place] FDA and  has been authorized for detection and/or diagnosis of SARS-CoV-2 by FDA under an Emergency Use Authorization (EUA). This EUA will remain  in effect (meaning this test can be used) for the duration of the COVID-19 declaration under Se ction 564(b)(1) of the Act, 21 U.S.C. section 360bbb-3(b)(1), unless the authorization is terminated or revoked sooner.  Performed at [facility], [address], [place], [place] [id number]      Time coordinating discharge: 40 minutes  SIGNED:   Alba Cory, MD  [facility]

## 2020-03-04 NOTE — TOC Transition Note (Signed)
Transition of Care [facility]) - CM/SW Discharge Note   Patient Details  Name: Austin Malone MRN: [id number] Date of Birth: 1940/11/07  [facility] of [facility]) CM/SW Contact:  Levada Schilling Phone Number: 03/04/2020, 9:29 AM   Clinical Narrative:    Patient will Discharge To: [facility], [place] Anticipated DC Date:03/04/20 Family Notified:yes, daughter Zollie Pee, [phone] Transport OY:[id number]   Per MD patient ready for DC to [facility] in [place], [place]. RN, patient, patient's family, and facility notified of DC. Assessment, Fl2/Pasrr, and Discharge Summary sent to facility. RN given number for report [phone]). DC packet on chart. Ambulance transport requested for patient.   CSW signing off.  [facility] [phone]     Final next level of care: Skilled Nursing Facility Barriers to Discharge: No Barriers Identified   Patient Goals and CMS Choice        Discharge Placement              Patient chooses bed at: [facility] Patient to be transferred to facility by: [facility] Name of family member notified: Zollie Pee, daughter Patient and family notified of of transfer: 03/04/20  Discharge Plan and Services In-house Referral: Clinical Social Work                                   Social Determinants of Health (SDOH) Interventions     Readmission Risk Interventions No flowsheet data found.

## 2020-03-16 ENCOUNTER — Ambulatory Visit: Payer: Medicare HMO | Admitting: Cardiology

## 2020-05-09 ENCOUNTER — Ambulatory Visit: Payer: Medicare HMO | Admitting: Cardiology

## 2020-05-19 DIAGNOSIS — R531 Weakness: Secondary | ICD-10-CM | POA: Diagnosis not present

## 2020-06-01 ENCOUNTER — Telehealth: Payer: Self-pay

## 2020-06-01 DIAGNOSIS — K2100 Gastro-esophageal reflux disease with esophagitis, without bleeding: Secondary | ICD-10-CM | POA: Insufficient documentation

## 2020-06-01 DIAGNOSIS — F319 Bipolar disorder, unspecified: Secondary | ICD-10-CM | POA: Insufficient documentation

## 2020-06-01 DIAGNOSIS — I700 Atherosclerosis of aorta: Secondary | ICD-10-CM

## 2020-06-01 DIAGNOSIS — E785 Hyperlipidemia, unspecified: Secondary | ICD-10-CM | POA: Insufficient documentation

## 2020-06-01 DIAGNOSIS — D518 Other vitamin B12 deficiency anemias: Secondary | ICD-10-CM | POA: Insufficient documentation

## 2020-06-01 DIAGNOSIS — N3281 Overactive bladder: Secondary | ICD-10-CM

## 2020-06-01 DIAGNOSIS — I209 Angina pectoris, unspecified: Secondary | ICD-10-CM

## 2020-06-01 DIAGNOSIS — E042 Nontoxic multinodular goiter: Secondary | ICD-10-CM | POA: Insufficient documentation

## 2020-06-01 DIAGNOSIS — I714 Abdominal aortic aneurysm, without rupture, unspecified: Secondary | ICD-10-CM | POA: Insufficient documentation

## 2020-06-01 DIAGNOSIS — E039 Hypothyroidism, unspecified: Secondary | ICD-10-CM | POA: Insufficient documentation

## 2020-06-01 DIAGNOSIS — I110 Hypertensive heart disease with heart failure: Secondary | ICD-10-CM

## 2020-06-01 DIAGNOSIS — E559 Vitamin D deficiency, unspecified: Secondary | ICD-10-CM

## 2020-06-01 DIAGNOSIS — L97309 Non-pressure chronic ulcer of unspecified ankle with unspecified severity: Secondary | ICD-10-CM

## 2020-06-01 DIAGNOSIS — Z79899 Other long term (current) drug therapy: Secondary | ICD-10-CM

## 2020-06-01 DIAGNOSIS — F0390 Unspecified dementia without behavioral disturbance: Secondary | ICD-10-CM | POA: Insufficient documentation

## 2020-06-01 DIAGNOSIS — F411 Generalized anxiety disorder: Secondary | ICD-10-CM | POA: Insufficient documentation

## 2020-06-01 DIAGNOSIS — A681 Tick-borne relapsing fever: Secondary | ICD-10-CM

## 2020-06-01 DIAGNOSIS — I4891 Unspecified atrial fibrillation: Secondary | ICD-10-CM

## 2020-06-01 DIAGNOSIS — I25700 Atherosclerosis of coronary artery bypass graft(s), unspecified, with unstable angina pectoris: Secondary | ICD-10-CM | POA: Insufficient documentation

## 2020-06-01 DIAGNOSIS — E1165 Type 2 diabetes mellitus with hyperglycemia: Secondary | ICD-10-CM

## 2020-06-01 DIAGNOSIS — M150 Primary generalized (osteo)arthritis: Secondary | ICD-10-CM

## 2020-06-01 DIAGNOSIS — G4700 Insomnia, unspecified: Secondary | ICD-10-CM

## 2020-06-01 DIAGNOSIS — E222 Syndrome of inappropriate secretion of antidiuretic hormone: Secondary | ICD-10-CM

## 2020-06-01 DIAGNOSIS — I6523 Occlusion and stenosis of bilateral carotid arteries: Secondary | ICD-10-CM | POA: Insufficient documentation

## 2020-06-01 DIAGNOSIS — I249 Acute ischemic heart disease, unspecified: Secondary | ICD-10-CM

## 2020-06-01 DIAGNOSIS — R269 Unspecified abnormalities of gait and mobility: Secondary | ICD-10-CM

## 2020-06-01 DIAGNOSIS — I2510 Atherosclerotic heart disease of native coronary artery without angina pectoris: Secondary | ICD-10-CM

## 2020-06-01 DIAGNOSIS — N401 Enlarged prostate with lower urinary tract symptoms: Secondary | ICD-10-CM

## 2020-06-01 DIAGNOSIS — D689 Coagulation defect, unspecified: Secondary | ICD-10-CM | POA: Insufficient documentation

## 2020-06-01 DIAGNOSIS — Z5181 Encounter for therapeutic drug level monitoring: Secondary | ICD-10-CM

## 2020-06-01 HISTORY — DX: Benign prostatic hyperplasia with lower urinary tract symptoms: N40.1

## 2020-06-01 HISTORY — DX: Hypothyroidism, unspecified: E03.9

## 2020-06-01 HISTORY — DX: Syndrome of inappropriate secretion of antidiuretic hormone: E22.2

## 2020-06-01 HISTORY — DX: Encounter for therapeutic drug level monitoring: Z51.81

## 2020-06-01 HISTORY — DX: Nontoxic multinodular goiter: E04.2

## 2020-06-01 HISTORY — DX: Other long term (current) drug therapy: Z79.899

## 2020-06-01 HISTORY — DX: Overactive bladder: N32.81

## 2020-06-01 HISTORY — DX: Unspecified atrial fibrillation: I48.91

## 2020-06-01 HISTORY — DX: Other vitamin B12 deficiency anemias: D51.8

## 2020-06-01 HISTORY — DX: Abdominal aortic aneurysm, without rupture, unspecified: I71.40

## 2020-06-01 HISTORY — DX: Atherosclerosis of aorta: I70.0

## 2020-06-01 HISTORY — DX: Coagulation defect, unspecified: D68.9

## 2020-06-01 HISTORY — DX: Insomnia, unspecified: G47.00

## 2020-06-01 HISTORY — DX: Generalized anxiety disorder: F41.1

## 2020-06-01 HISTORY — DX: Unspecified abnormalities of gait and mobility: R26.9

## 2020-06-01 HISTORY — DX: Type 2 diabetes mellitus with hyperglycemia: E11.65

## 2020-06-01 HISTORY — DX: Occlusion and stenosis of bilateral carotid arteries: I65.23

## 2020-06-01 HISTORY — DX: Atherosclerosis of coronary artery bypass graft(s), unspecified, with unstable angina pectoris: I25.700

## 2020-06-01 HISTORY — DX: Angina pectoris, unspecified: I20.9

## 2020-06-01 HISTORY — DX: Bipolar disorder, unspecified: F31.9

## 2020-06-01 HISTORY — DX: Atherosclerotic heart disease of native coronary artery without angina pectoris: I25.10

## 2020-06-01 HISTORY — DX: Gastro-esophageal reflux disease with esophagitis, without bleeding: K21.00

## 2020-06-01 HISTORY — DX: Acute ischemic heart disease, unspecified: I24.9

## 2020-06-01 HISTORY — DX: Hypertensive heart disease with heart failure: I11.0

## 2020-06-01 HISTORY — DX: Abdominal aortic aneurysm, without rupture: I71.4

## 2020-06-01 HISTORY — DX: Primary generalized (osteo)arthritis: M15.0

## 2020-06-01 HISTORY — DX: Vitamin D deficiency, unspecified: E55.9

## 2020-06-01 HISTORY — DX: Unspecified dementia, unspecified severity, without behavioral disturbance, psychotic disturbance, mood disturbance, and anxiety: F03.90

## 2020-06-01 HISTORY — DX: Tick-borne relapsing fever: A68.1

## 2020-06-01 HISTORY — DX: Non-pressure chronic ulcer of unspecified ankle with unspecified severity: L97.309

## 2020-06-01 NOTE — Telephone Encounter (Signed)
Spoke with patient and scheduled an in-person Palliative Consult for 07/13/20 @ 12:30PM  COVID screening was negative. Has one dog in the home. Patient lives alone.  Consent obtained; updated Outlook/Netsmart/Team List and Epic.   Patient is aware he may be receiving a call from NP the day before or day of to confirm appointment.

## 2020-06-04 ENCOUNTER — Ambulatory Visit: Payer: Medicare HMO | Admitting: Cardiology

## 2020-07-13 ENCOUNTER — Other Ambulatory Visit: Payer: Self-pay

## 2020-07-13 ENCOUNTER — Other Ambulatory Visit: Payer: Medicare HMO | Admitting: Student

## 2021-09-03 IMAGING — CT CT RENAL STONE PROTOCOL
2 of 4 series · 16 of 46 positions shown, 18 images · non-contrast
Comparison: 07/22/2017

CLINICAL DATA: Flank pain with kidney stone suspected.

Vomiting and episode of diarrhea.
EXAM:
CT ABDOMEN AND PELVIS WITHOUT CONTRAST
TECHNIQUE: Multidetector CT imaging of the abdomen and pelvis was performed
following the standard protocol without IV contrast.

[Series 3: renal stone 5.0 · axial · 0.82mm/px · z∈[+680,+1105]mm · 13 of 93 slices shown, 15 images]
[im 4/93  soft-tissue]
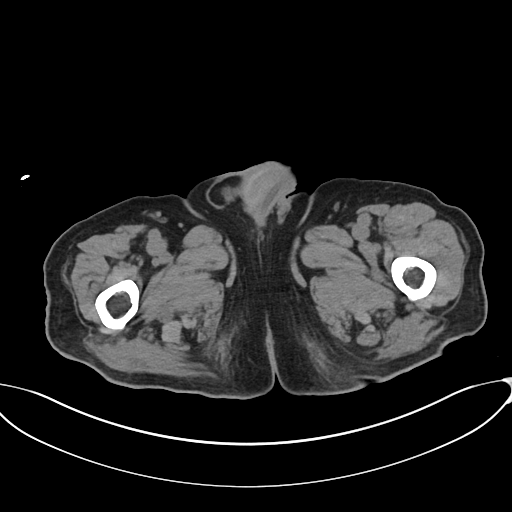
[im 4/93  bone]
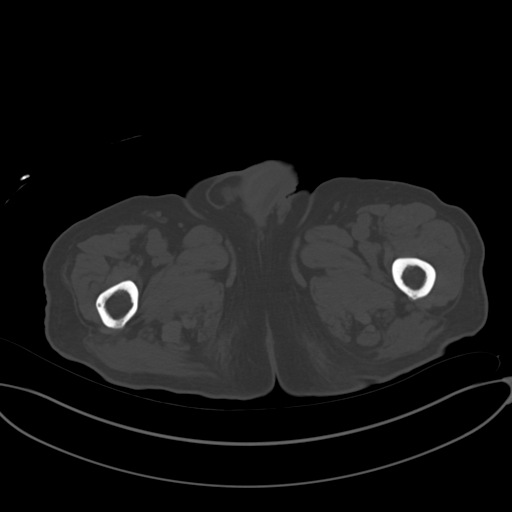
[im 12/93  soft-tissue]
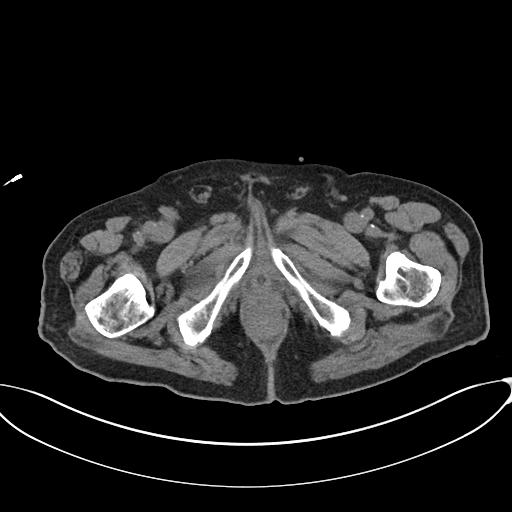
[im 19/93  soft-tissue]
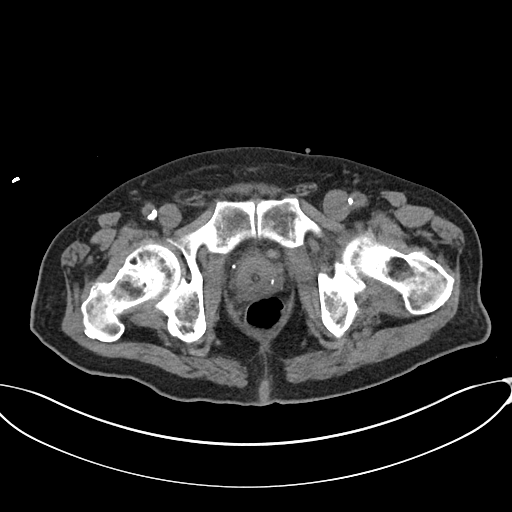
[im 26/93  soft-tissue]
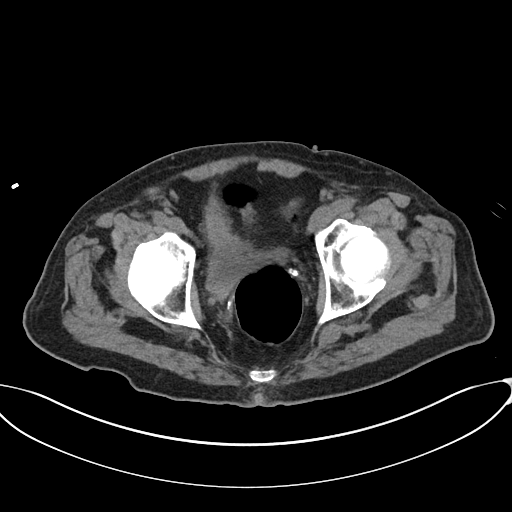
[im 34/93  soft-tissue]
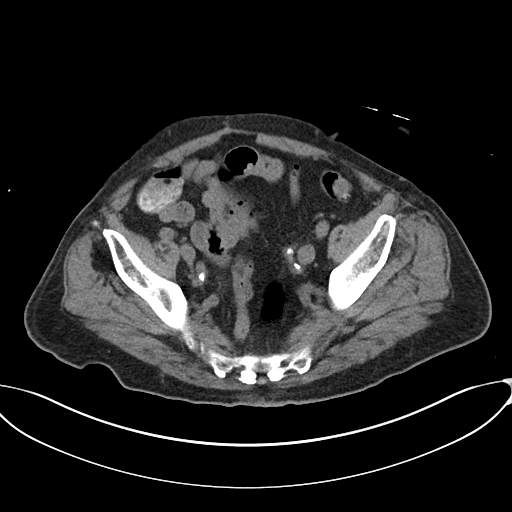
[im 41/93  soft-tissue]
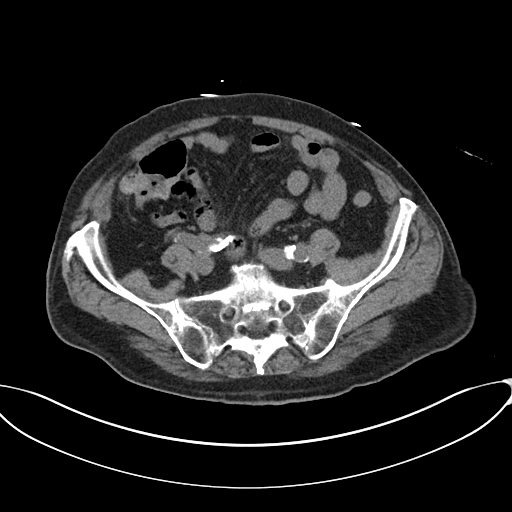
[im 48/93  soft-tissue]
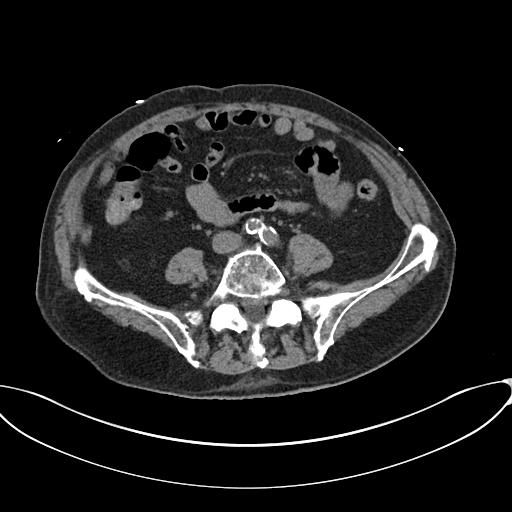
[im 52/93  soft-tissue]
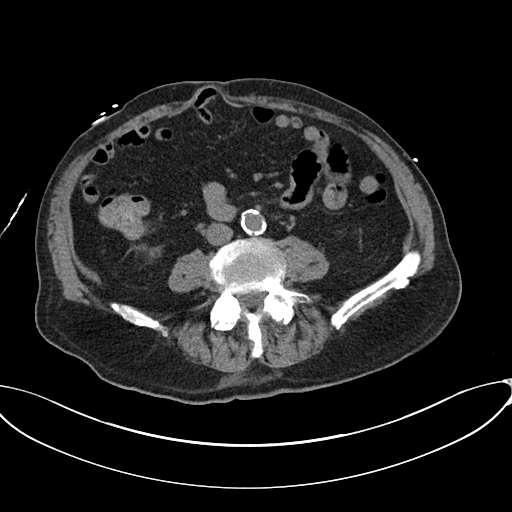
[im 59/93  soft-tissue]
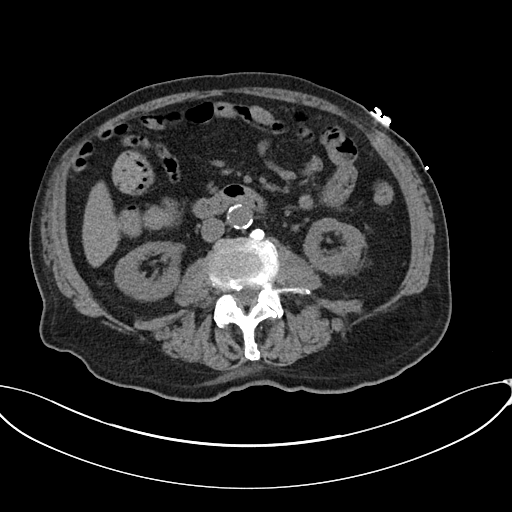
[im 59/93  bone]
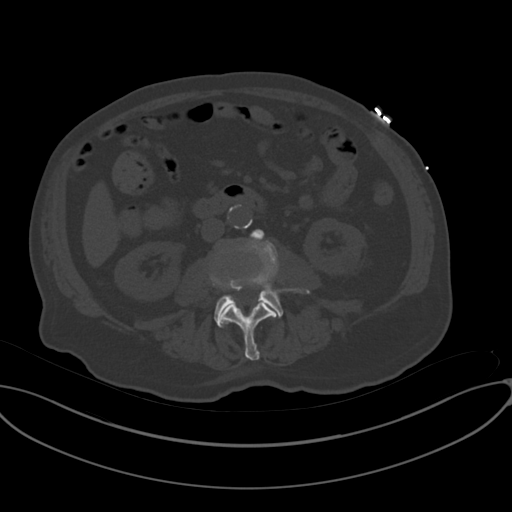
[im 67/93  soft-tissue]
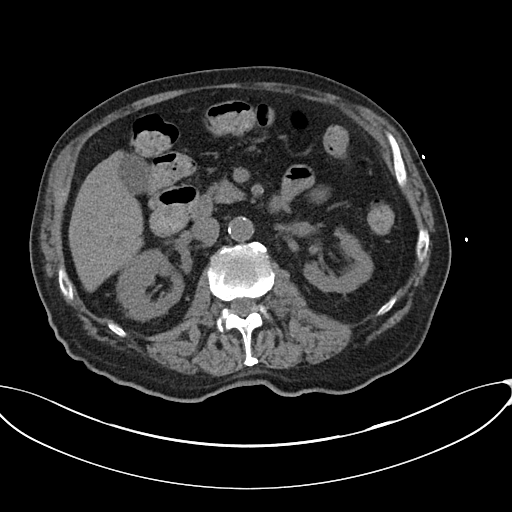
[im 74/93  soft-tissue]
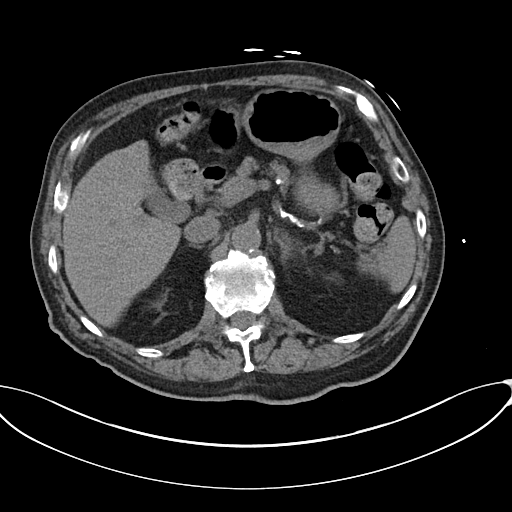
[im 81/93  soft-tissue]
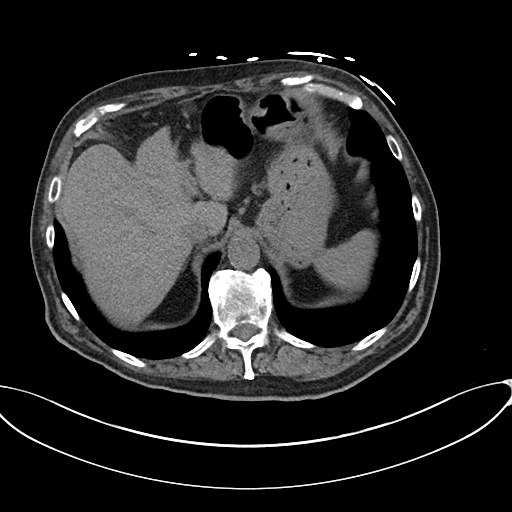
[im 89/93  soft-tissue]
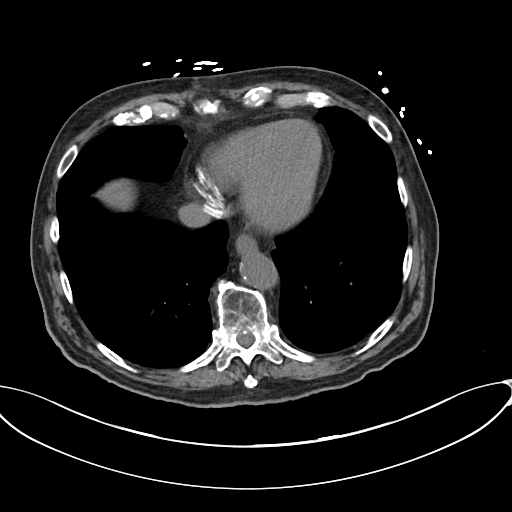

[Series 6: coronal · coronal · 0.85mm/px · 3 of 104 slices shown]
[im 35/104  soft-tissue]
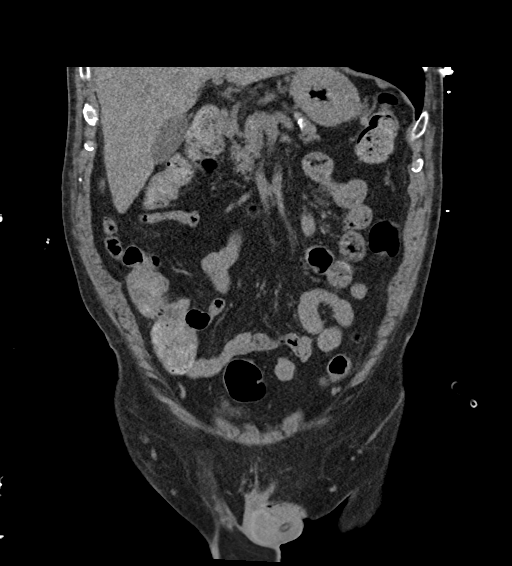
[im 46/104  soft-tissue]
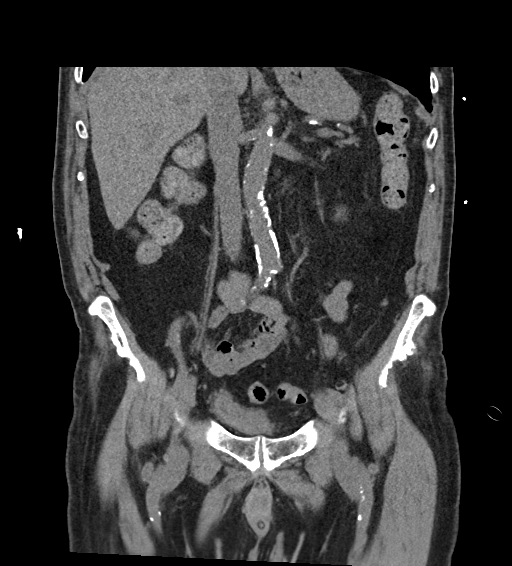
[im 58/104  soft-tissue]
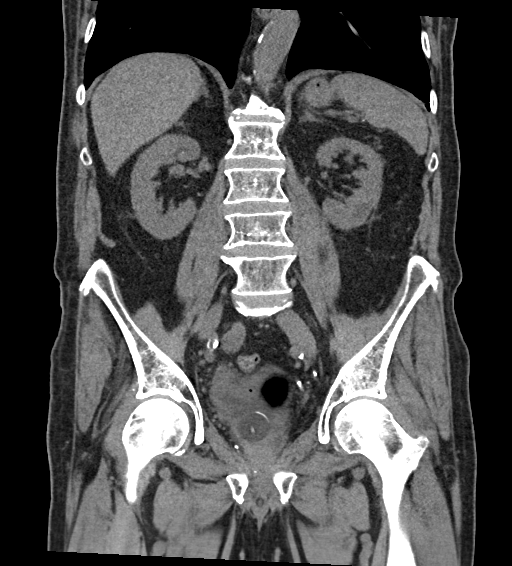

[16 of 46 positions shown; findings below may reference images not displayed]

FINDINGS: Lower chest:  Coronary atherosclerosis.

Hepatobiliary: No focal liver abnormality.No evidence of biliary
obstruction or stone.

Pancreas: Unremarkable.

Spleen: Unremarkable.

Adrenals/Urinary Tract: Negative adrenals. No hydronephrosis or
ureteral stone. Small cystic density is suggested at the lower pole
left kidney. The bladder is decompressed by a Foley catheter. The
bladder has an indistinct wall with prominent thickness.
High-density within the bladder appears crescentic and not typical
of a stone.

Stomach/Bowel:  No obstruction. No visible inflammation

Vascular/Lymphatic: Diffuse atherosclerotic calcification of the
aorta and iliacs. No mass or adenopathy.

Reproductive:No pathologic findings. Possible lipoma along the lower
right spermatic cord, but only partially covered. No distension of
the right upper inguinal canal to suggest this reflects a fatty
hernia.

Other: No ascites or pneumoperitoneum.

Musculoskeletal: No acute abnormalities. Spondylosis and
degenerative facet spurring
IMPRESSION: 1. No acute finding.  No hydronephrosis or ureteral calculus.
2. Indistinct bladder wall, please correlate for cystitis symptoms.
3. History of vomiting and diarrhea. No visible bowel inflammation
or obstruction.

## 2022-07-24 IMAGING — CT CT CERVICAL SPINE W/O CM
3 of 4 series · 13 of 35 positions shown, 16 images · non-contrast
Comparison: CT head 06/27/2017

CLINICAL DATA: Neck trauma.

EXAM:
CT HEAD WITHOUT CONTRAST
CT CERVICAL SPINE WITHOUT CONTRAST
TECHNIQUE: Multidetector CT imaging of the head and cervical spine was
performed following the standard protocol without intravenous
contrast. Multiplanar CT image reconstructions of the cervical spine
were also generated.

[Series 8: sag bone · sagittal · 0.38mm/px · 5 of 86 slices shown, 6 images]
[im 29/86  bone]
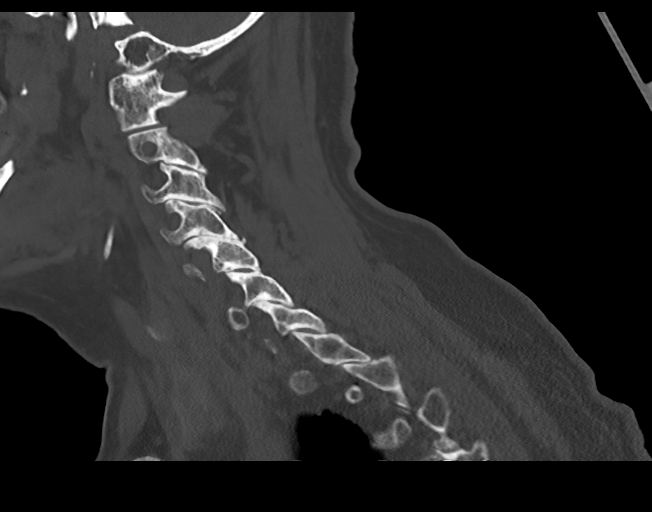
[im 36/86  bone]
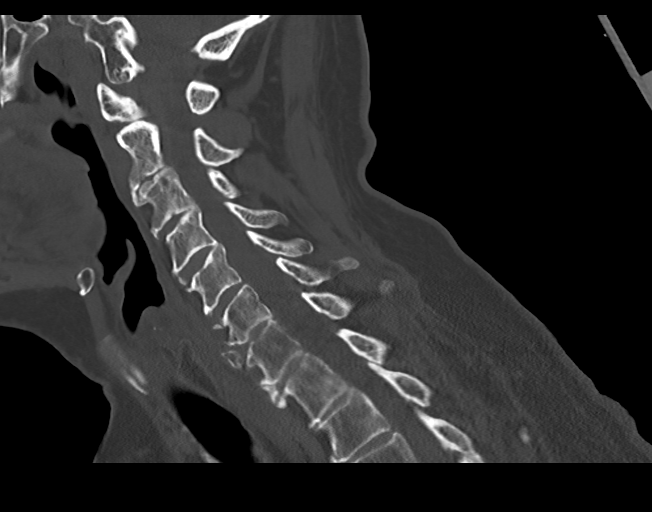
[im 43/86  soft-tissue]
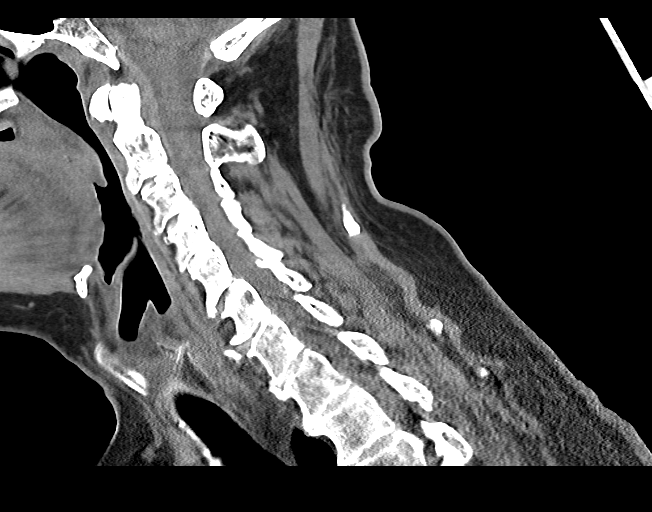
[im 43/86  bone]
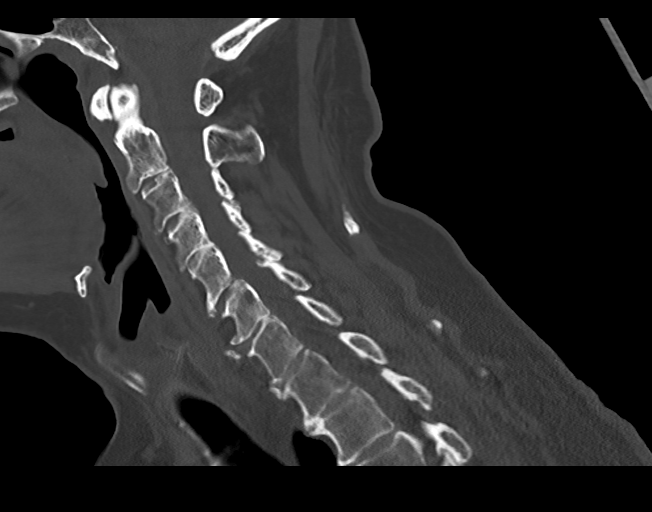
[im 50/86  bone]
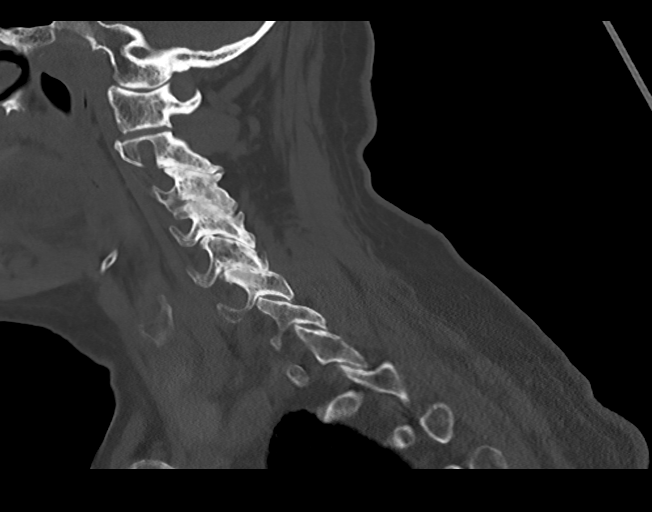
[im 57/86  bone]
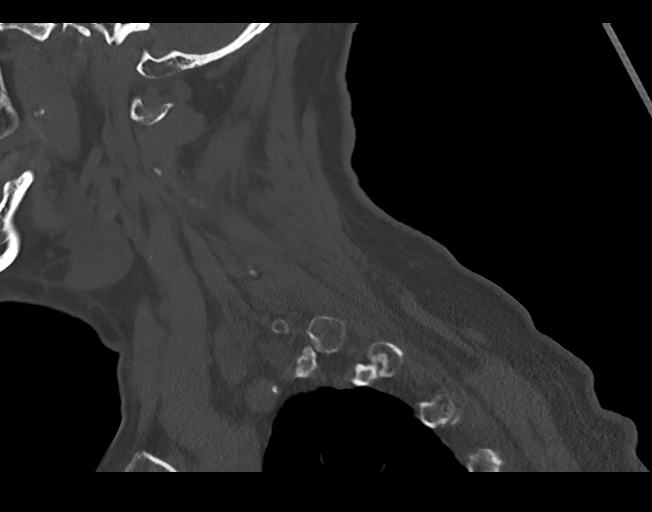

[Series 9: cor bone · coronal · 0.33mm/px · 3 of 123 slices shown]
[im 25/123  bone]
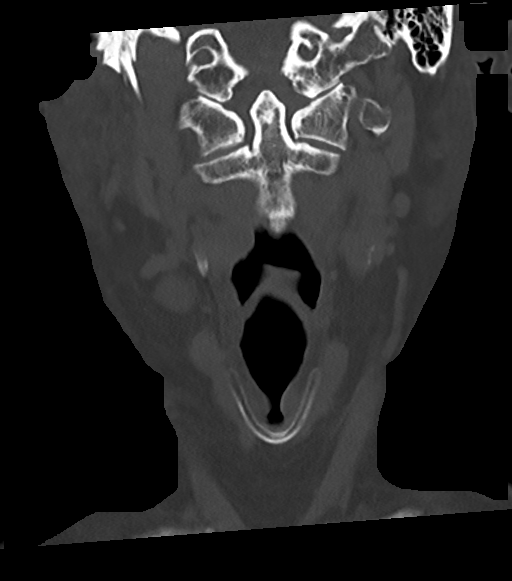
[im 49/123  bone]
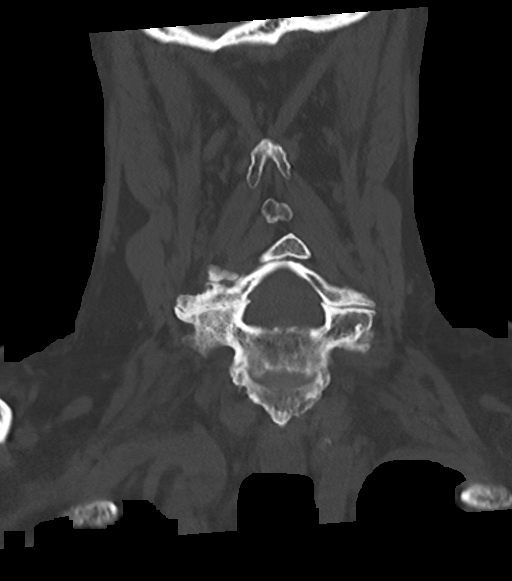
[im 74/123  bone]
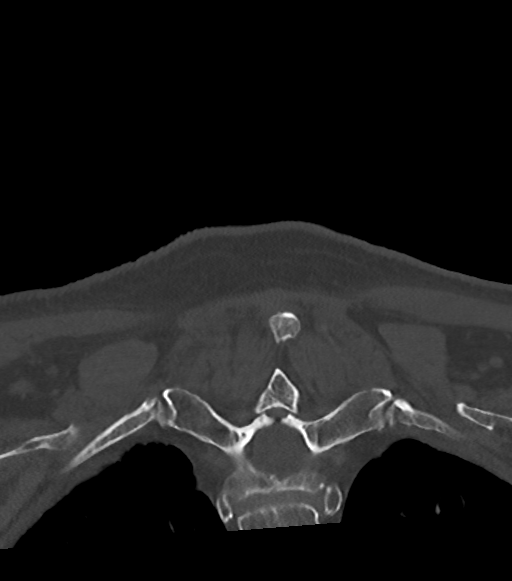

[Series 10: orthogonal axials · oblique · 0.28mm/px · 5 of 93 slices shown, 7 images]
[im 16/93  soft-tissue]
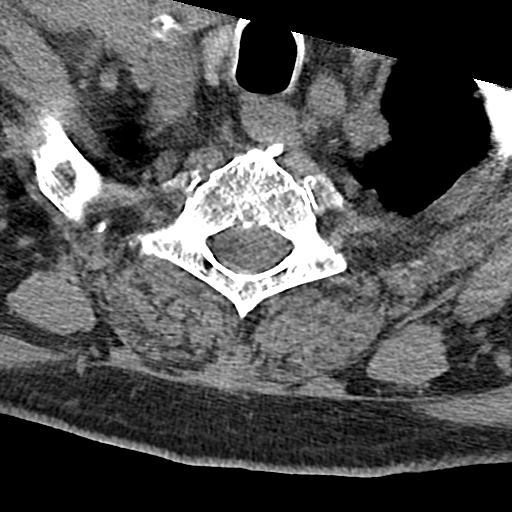
[im 16/93  bone]
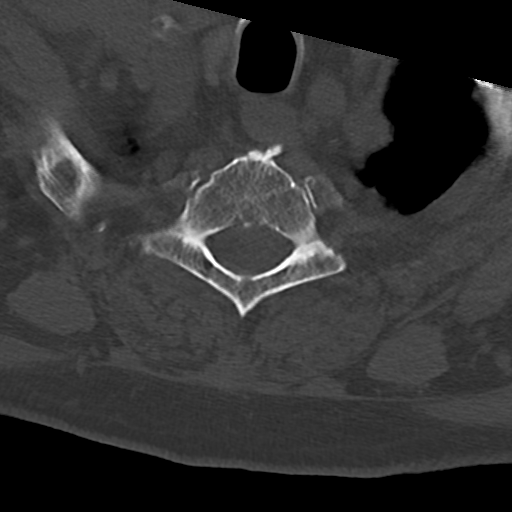
[im 31/93  bone]
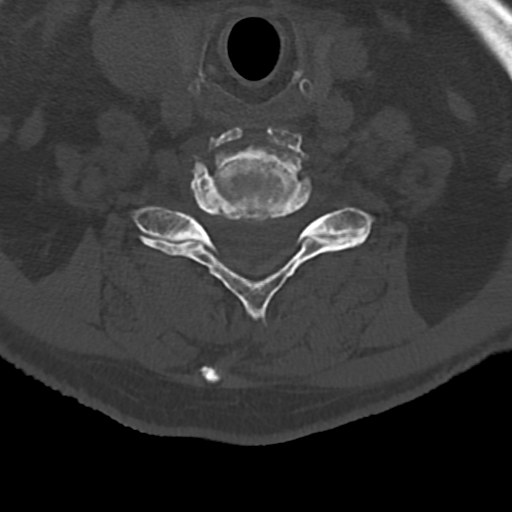
[im 47/93  bone]
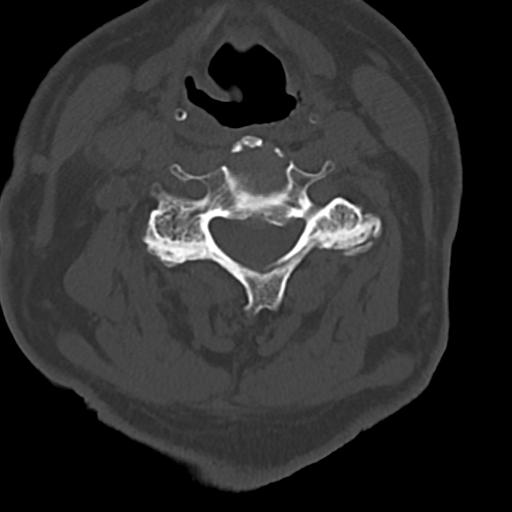
[im 62/93  bone]
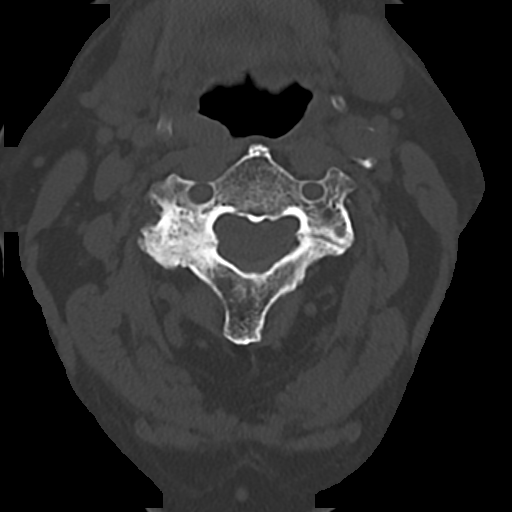
[im 77/93  soft-tissue]
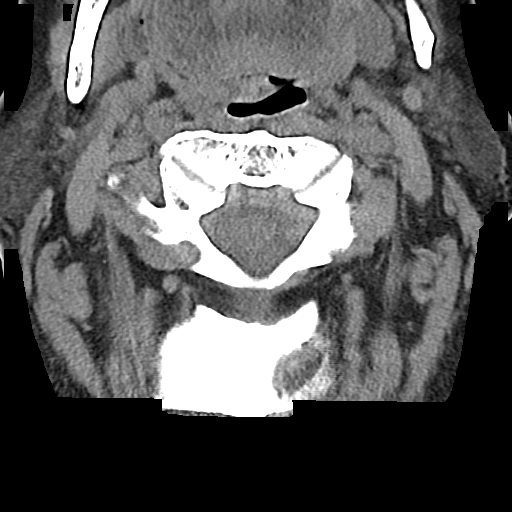
[im 77/93  bone]
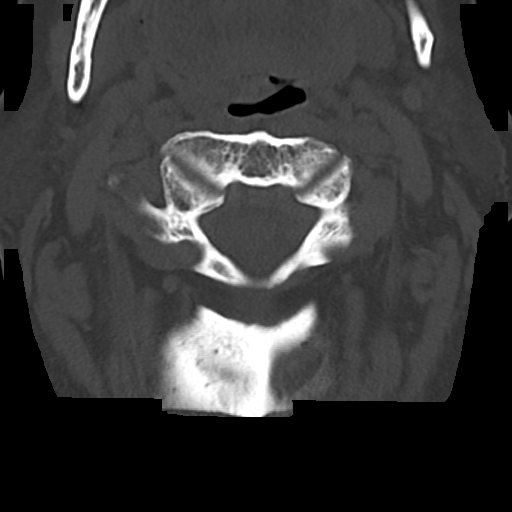

[13 of 35 positions shown; findings below may reference images not displayed]

FINDINGS: CT HEAD FINDINGS

Brain:

Cerebral ventricle sizes are concordant with the degree of cerebral
volume loss.

Patchy and confluent areas of decreased attenuation are noted
throughout the deep and periventricular white matter of the cerebral
hemispheres bilaterally, compatible with chronic microvascular
ischemic disease. Redemonstration of hypodensity within the left
basal ganglia that could represent prior infarction.

No evidence of large-territorial acute infarction. No parenchymal
hemorrhage. No mass lesion. No extra-axial collection.

No mass effect or midline shift. No hydrocephalus. Basilar cisterns
are patent.

Vascular: No hyperdense vessel. Atherosclerotic calcifications are
present within the cavernous internal carotid and vertebral
arteries.

Skull: No acute fracture or focal lesion.

Sinuses/Orbits: Persistent opacification of the right mastoid cells
and middle ear. Paranasal sinuses and left mastoid air cells are
clear. Similar-appearing right orbit hyperdensity likely
postsurgical. Lens replacement on the left. Similar-appearing
slightly heterogeneous matrix on the left.

Other: None.

CT CERVICAL SPINE FINDINGS

Alignment: Straightening of the normal cervical lordosis likely due
to positioning and degenerative changes.

Skull base and vertebrae: Multilevel degenerative changes of the
spine. No acute fracture. No aggressive appearing focal osseous
lesion or focal pathologic process.

Soft tissues and spinal canal: No prevertebral fluid or swelling. No
visible canal hematoma.

Upper chest: Mild emphysematous changes.

Other: None.
IMPRESSION: 1. No acute intracranial abnormality.
2. No acute displaced fracture or traumatic listhesis of the
cervical spine.
